# Patient Record
Sex: Male | Born: 1950 | Race: White | Hispanic: No | Marital: Married | Smoking: Never smoker
Health system: Southern US, Community
[De-identification: ages and names within clinical notes are randomized; demographics above are authoritative.]

## PROBLEM LIST (undated history)

## (undated) DIAGNOSIS — C61 Malignant neoplasm of prostate: Secondary | ICD-10-CM

## (undated) DIAGNOSIS — I10 Essential (primary) hypertension: Secondary | ICD-10-CM

## (undated) DIAGNOSIS — I4891 Unspecified atrial fibrillation: Secondary | ICD-10-CM

## (undated) DIAGNOSIS — C801 Malignant (primary) neoplasm, unspecified: Secondary | ICD-10-CM

## (undated) DIAGNOSIS — Z86718 Personal history of other venous thrombosis and embolism: Secondary | ICD-10-CM

## (undated) DIAGNOSIS — K219 Gastro-esophageal reflux disease without esophagitis: Secondary | ICD-10-CM

## (undated) DIAGNOSIS — I499 Cardiac arrhythmia, unspecified: Secondary | ICD-10-CM

## (undated) DIAGNOSIS — Z8719 Personal history of other diseases of the digestive system: Secondary | ICD-10-CM

## (undated) DIAGNOSIS — K2270 Barrett's esophagus without dysplasia: Secondary | ICD-10-CM

## (undated) DIAGNOSIS — E782 Mixed hyperlipidemia: Secondary | ICD-10-CM

## (undated) DIAGNOSIS — G4733 Obstructive sleep apnea (adult) (pediatric): Secondary | ICD-10-CM

## (undated) DIAGNOSIS — M2550 Pain in unspecified joint: Secondary | ICD-10-CM

## (undated) DIAGNOSIS — K859 Acute pancreatitis without necrosis or infection, unspecified: Secondary | ICD-10-CM

## (undated) DIAGNOSIS — I2699 Other pulmonary embolism without acute cor pulmonale: Secondary | ICD-10-CM

## (undated) DIAGNOSIS — M1990 Unspecified osteoarthritis, unspecified site: Secondary | ICD-10-CM

## (undated) DIAGNOSIS — Z87442 Personal history of urinary calculi: Secondary | ICD-10-CM

## (undated) DIAGNOSIS — N189 Chronic kidney disease, unspecified: Secondary | ICD-10-CM

## (undated) HISTORY — DX: Malignant neoplasm of prostate: C61

## (undated) HISTORY — DX: Barrett's esophagus without dysplasia: K22.70

## (undated) HISTORY — PX: KNEE ARTHROSCOPY: SUR90

## (undated) HISTORY — PX: HERNIA REPAIR: SHX51

## (undated) HISTORY — DX: Malignant (primary) neoplasm, unspecified: C80.1

## (undated) HISTORY — PX: CHOLECYSTECTOMY: SHX55

## (undated) HISTORY — DX: Personal history of other venous thrombosis and embolism: Z86.718

## (undated) HISTORY — DX: Pain in unspecified joint: M25.50

## (undated) HISTORY — DX: Unspecified atrial fibrillation: I48.91

## (undated) HISTORY — DX: Essential (primary) hypertension: I10

## (undated) HISTORY — DX: Obstructive sleep apnea (adult) (pediatric): G47.33

## (undated) HISTORY — PX: JOINT REPLACEMENT: SHX530

---

## 2001-10-23 HISTORY — PX: GALLBLADDER SURGERY: SHX652

## 2003-10-24 HISTORY — PX: RADIATION IMPLANT W/ ULTRASOUND, MALE: SUR1068

## 2006-02-20 ENCOUNTER — Ambulatory Visit: Admission: RE | Admit: 2006-02-20 | Discharge: 2006-05-21 | Payer: Self-pay | Admitting: Radiation Oncology

## 2006-07-16 ENCOUNTER — Ambulatory Visit: Payer: Self-pay | Admitting: Nurse Practitioner

## 2006-08-16 ENCOUNTER — Ambulatory Visit: Payer: Self-pay | Admitting: Unknown Physician Specialty

## 2006-09-17 ENCOUNTER — Encounter: Payer: Self-pay | Admitting: Unknown Physician Specialty

## 2006-09-22 ENCOUNTER — Encounter: Payer: Self-pay | Admitting: Unknown Physician Specialty

## 2006-10-23 ENCOUNTER — Encounter: Payer: Self-pay | Admitting: Unknown Physician Specialty

## 2006-10-23 DIAGNOSIS — C801 Malignant (primary) neoplasm, unspecified: Secondary | ICD-10-CM

## 2006-10-23 HISTORY — DX: Malignant (primary) neoplasm, unspecified: C80.1

## 2007-01-03 ENCOUNTER — Ambulatory Visit: Payer: Self-pay | Admitting: Nurse Practitioner

## 2007-07-23 ENCOUNTER — Ambulatory Visit: Payer: Self-pay | Admitting: Nurse Practitioner

## 2007-08-14 ENCOUNTER — Ambulatory Visit: Payer: Self-pay | Admitting: Nurse Practitioner

## 2007-09-27 ENCOUNTER — Ambulatory Visit (HOSPITAL_COMMUNITY): Admission: RE | Admit: 2007-09-27 | Discharge: 2007-09-27 | Payer: Self-pay | Admitting: General Surgery

## 2007-10-04 ENCOUNTER — Encounter: Admission: RE | Admit: 2007-10-04 | Discharge: 2007-10-05 | Payer: Self-pay | Admitting: General Surgery

## 2007-10-24 HISTORY — PX: LAPAROSCOPIC GASTRIC BANDING: SHX1100

## 2008-03-12 ENCOUNTER — Encounter: Admission: RE | Admit: 2008-03-12 | Discharge: 2008-06-10 | Payer: Self-pay | Admitting: General Surgery

## 2008-03-19 ENCOUNTER — Ambulatory Visit: Payer: Self-pay | Admitting: Unknown Physician Specialty

## 2008-03-30 ENCOUNTER — Ambulatory Visit (HOSPITAL_COMMUNITY): Admission: RE | Admit: 2008-03-30 | Discharge: 2008-03-31 | Payer: Self-pay | Admitting: General Surgery

## 2008-10-23 DIAGNOSIS — G4730 Sleep apnea, unspecified: Secondary | ICD-10-CM

## 2008-10-23 HISTORY — PX: UMBILICAL HERNIA REPAIR: SHX196

## 2008-10-23 HISTORY — DX: Sleep apnea, unspecified: G47.30

## 2008-10-26 ENCOUNTER — Ambulatory Visit (HOSPITAL_COMMUNITY): Admission: RE | Admit: 2008-10-26 | Discharge: 2008-10-27 | Payer: Self-pay | Admitting: General Surgery

## 2008-12-30 ENCOUNTER — Ambulatory Visit: Payer: Self-pay | Admitting: Nurse Practitioner

## 2009-08-05 ENCOUNTER — Ambulatory Visit: Payer: Self-pay | Admitting: Nurse Practitioner

## 2009-09-03 ENCOUNTER — Emergency Department: Payer: Self-pay | Admitting: Unknown Physician Specialty

## 2009-09-27 ENCOUNTER — Ambulatory Visit: Payer: Self-pay | Admitting: Nurse Practitioner

## 2009-10-23 HISTORY — PX: OTHER SURGICAL HISTORY: SHX169

## 2009-10-28 ENCOUNTER — Inpatient Hospital Stay: Payer: Self-pay | Admitting: Gastroenterology

## 2011-02-06 LAB — URINALYSIS, ROUTINE W REFLEX MICROSCOPIC
Bilirubin Urine: NEGATIVE
Glucose, UA: NEGATIVE mg/dL
Hgb urine dipstick: NEGATIVE
Protein, ur: NEGATIVE mg/dL
Specific Gravity, Urine: 1.02 (ref 1.005–1.030)

## 2011-02-06 LAB — DIFFERENTIAL
Basophils Relative: 0 % (ref 0–1)
Eosinophils Absolute: 0.2 10*3/uL (ref 0.0–0.7)
Lymphs Abs: 1 10*3/uL (ref 0.7–4.0)
Neutro Abs: 2.8 10*3/uL (ref 1.7–7.7)
Neutrophils Relative %: 64 % (ref 43–77)

## 2011-02-06 LAB — CBC
Hemoglobin: 12.3 g/dL — ABNORMAL LOW (ref 13.0–17.0)
RDW: 13.8 % (ref 11.5–15.5)

## 2011-02-06 LAB — COMPREHENSIVE METABOLIC PANEL
ALT: 52 U/L (ref 0–53)
AST: 39 U/L — ABNORMAL HIGH (ref 0–37)
Albumin: 3.5 g/dL (ref 3.5–5.2)
CO2: 29 mEq/L (ref 19–32)
Calcium: 8.9 mg/dL (ref 8.4–10.5)
GFR calc Af Amer: >60 mL/min (ref >60)
GFR calc non Af Amer: >60 mL/min (ref >60)
Total Bilirubin: 0.9 mg/dL (ref 0.3–1.2)

## 2011-02-17 ENCOUNTER — Encounter (INDEPENDENT_AMBULATORY_CARE_PROVIDER_SITE_OTHER): Payer: Self-pay | Admitting: General Surgery

## 2011-03-07 NOTE — Op Note (Signed)
Grant Diaz, Grant Diaz NO.:  [id number]   MEDICAL RECORD NO.:  [id number]          PATIENT TYPE:  OIB   LOCATION:  0098                         FACILITY:  [facility]   PHYSICIAN:  Grant Diaz, M.D.DATE OF BIRTH:  08-04-1951   DATE OF PROCEDURE:  03/30/2008  DATE OF DISCHARGE:                               OPERATIVE REPORT   PREOPERATIVE DIAGNOSES:  1. Morbid obesity.  2. Hiatal hernia.   POSTOPERATIVE DIAGNOSES:  1. Morbid obesity.  2. Hiatal hernia.   SURGICAL PROCEDURES:  1. Laparoscopic adjustable gastric band.  2. Hiatal hernia repair.   SURGEON:  Lorne Skeens. Diaz, M.D.   ASSISTANT:  Dr. Thea Alken.   ANESTHESIA:  General.   BRIEF HISTORY:  [name] is a 60 year old male with progressive morbid  obesity, unresponsive to medical management and multiple comorbidities.  After an extensive preoperative discussion and workup detailed  elsewhere, we have elected to proceed with placement of laparoscopic  adjustable gastric band for treatment of his obesity.  He has a known  hiatal hernia and preoperative upper GI series has confirmed a moderate-  sized hiatal hernia, which we will plan to repair at the time of his  band placement.   DESCRIPTION OF OPERATION:  The patient brought to the operating room,  placed in supine position on the operating table and general  endotracheal anesthesia was induced.  The abdomen was widely sterilely  prepped and draped.  The correct patient and procedure were verified.  He had received preoperative IV antibiotics and subcutaneous heparin.  PAS were in place.  Local anesthesia was used to infiltrate the trocar  sites prior to the incision.  A 1-cm incision was made in the left  subcostal space and access obtained with an 11 mm OptiVu trocar without  difficulty.  Pneumoperitoneum was established.  Under direct vision, a  15 mm trocar was placed well laterally in the subcostal space, another  11 mm trocar in  the right upper quadrant, another 11 mm trocar to the  left above the umbilicus for the camera port.  Through a 5-mm site, the  [facility] retractor was placed in the epigastrium and the left lobe of  liver elevated with excellent closure of the hiatus and stomach.  Finally, a 5 mm trocar was placed in the left flank.  There was an  obvious good-sized hiatal hernia present.  We proceeded with repair.  The hernia was reduced, grabbing the periesophageal fat pad and  retracted inferiorly.  The gastrohepatic omentum was opened along the  avascular plane.  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  The peritoneum up overlying the  anterior esophagus was incised with the harmonic scalpel and then  working back down onto the superior portion of the left crus.  Beginning  anterior to the right crus just beneath the esophagus, the peritoneum  here was incised and a careful blunt dissection was carried back behind  the esophagus.  A good-sized hernia was defined.  The anterior border of  the right crus was dissected as much as necessary to define the hernia.  The esophagus and posterior vagus nerve were identified and carefully  protected.  A very large mediastinal retroesophageal fat pad was reduced  out of the hernia.  This was then removed using the harmonic scalpel.  Looking for the patient's right, we then dissected the retroesophageal  space and there was good esophageal length.  The left crus initially was  difficult to define.  We then went back along the left side of the  esophagus and further dissected out the left crus back toward its  origin.  In order to define this, the retroesophageal space was  completely dissected.  A Penrose drain was passed behind the esophagus  which was then elevated and further dissection along the left crus  defined it well.  It was difficult  to define initially due to a lot of  fat overlying it, and the further fat was taken off of the left crus  prior to closure.  After the defect was well-defined, a crural repair  was performed with interrupted 0 Ethibond sutures, closing the hiatus  down to a normal size.  At this point, a sizing tube was passed orally  into the stomach.  The balloon was inflated to 15 mL and pulled back  into the hiatus and it was seen to be closed nicely with no tendency of  the balloon to go through the opening.  The dissection had been very  tedious and took an hour and 30 minutes prior to beginning the placement  of the lap band.  At this point, the sizing balloon was deflated and the  sizing tube pulled back up into the upper esophagus.  A point at the  base of the right crus some crossing fat was identified which was just a  little bit caudad to our previous dissection to allow some tissue for  posterior fixation.  This area was incised and the finger dissector  passed retrogastric without difficulty and then deployed up through the  previous dissected area at the angle of His.  An AP Standard flush band  system was introduced, tubing passed into the finger dissector and then  this was brought back around through the retrogastric tunnel without  difficulty.  The band was brought back retrogastric without any  difficulty.  At this point, there was clearly a very large anterior  esophageal fat pad as well that was going to make the fixation sutures  difficult to place and, therefore, this was carefully resected  protecting the EG junction and the large anterior fat pad removed with  the harmonic scalpel.  Following this, the band was buckled over the  sizing tube without undue tension.  The sizing tube was withdrawn and  holding the tubing toward the patient's feet, the fundus beginning up  near the angle of His was imbricated up over the band to the small  gastric pouch with interrupted 2-0 Ethibond  sutures.  The band appeared  to be in excellent position.  The abdomen was carefully inspected for  hemostasis or any evidence of trocar injury and everything looked fine.  The Nathanson retractor was removed under direct vision.  The tubing was  brought out through the right mid abdominal trocar site and all CO2  evacuated and the trocars removed.  The right mid abdominal incision was  extended somewhat for placement of the port and the anterior fascia  identified and  cleared.  The tubing was trimmed and attached to the  port.  The port was sutured to the anterior fascia with four interrupted  2-0 Prolene sutures.  The tubing was seen to curve smoothly into the  abdomen.  The wounds were irrigated and the subcu closed with running 2-  0 Vicryl and the skin closed with subcuticular 4-0 Monocryl and  Dermabond.  Sponge, needle and instrument counts were correct.  The  patient was taken to recovery in good condition.      Lorne Skeens. Diaz, M.D.  Electronically Signed     BTH/MEDQ  D:  03/30/2008  T:  03/30/2008  Job:  [id number]

## 2011-03-07 NOTE — Op Note (Signed)
NAMEPAIGE, VANDERWOUDE                ACCOUNT NO.:  [id number]   MEDICAL RECORD NO.:  [id number]          PATIENT TYPE:  AMB   LOCATION:  DAY                          FACILITY:  [facility]   PHYSICIAN:  Sharlet Salina T. Hoxworth, M.D.DATE OF BIRTH:  September 16, 1951   DATE OF PROCEDURE:  10/26/2008  DATE OF DISCHARGE:                               OPERATIVE REPORT   POSTOPERATIVE DIAGNOSIS:  Primary ventral hernia.   SURGICAL PROCEDURES:  Laparoscopic repair of primary ventral hernia.   SURGEON:  Lorne Skeens. Hoxworth, M.D.   ANESTHESIA:  General.   BRIEF HISTORY:  Grant Diaz is a 60 year old male who is status post  lap band surgery in June of last year.  He has had a known primary  ventral hernia that initially was minimally symptomatic.  We postponed  repair until he could get some weight off.  He has lost 75 pounds.  His  hernia has become increasingly symptomatic with one episode of  incarceration that resolved.  Imaging has shown that it contained  omentum only.  Due to worsening of symptoms, we have elected to proceed  at this point.  The nature of the procedure, indications, risks of  anesthesia, bleeding, infection, recurrence, visceral injury were all  discussed and understood and he is brought to the operating room for  this procedure.   DESCRIPTION OF OPERATION:  The patient was brought to the operating room  and placed in supine position on the operating table and general  orotracheal anesthesia was induced.  He received preoperative IV  antibiotics.  Foley catheter was placed.  The abdomen was widely  sterilely prepped and draped with Ioban.  Correct patient and procedure  were verified.  Access was obtained in the left upper quadrant laterally  with an 11 mm OptiVu trocar without difficulty and pneumoperitoneum  established.  There was no evidence of any of trocar injury.  Under  direct vision, two 5 mm trocars were placed well laterally in the left  upper quadrant and left lower  quadrant.  The hernia defect was just  above the umbilicus and contained omentum.  Using blunt dissection and  harmonic scalpel, a large amount of omentum was reduced from the hernia  defect.  It did not contain any bowel.  Once the omentum was all  reduced, the hernia defect was very discrete and measured about 3.5 cm  in diameter.  The lap band tubing could be seen entering up near the  falciform ligament and appeared to be far enough away from the hernia  for a good overlap of the mesh.  I then fashioned a 15 cm diameter round  piece of Parietex dual sided mesh.  6-0 Prolene stay sutures were placed  symmetrically around the periphery.  Corresponding small stab incisions  were made in the skin and then the mesh was introduced into the abdomen,  unfurled and oriented.  Suture graspers were used to bring the suture up  to the anterior abdominal wall.  On the most superior suture, this was a  centimeter or two below the entrance point of the lap band tubing  which  was carefully protected.  Mesh was then brought up to the anterior  abdominal wall with very nice deployment and the sutures tied.  The Endo  tacker was then used to secure the mesh further at its periphery anda  second inner row was placed near to the defect.  This appeared to  provide nice broad coverage with at least 5 cm coverage in all  directions.  The abdomen was inspected for hemostasis or any evidence  of bowel injury and none was seen.  Trocars removed and all CO2  evacuated.  Skin incisions were closed with subcuticular Monocryl and  Dermabond.  Sponge, needle and instrument counts were correct.  The  patient was taken to recovery in good condition.      Lorne Skeens. Hoxworth, M.D.  Electronically Signed     BTH/MEDQ  D:  10/26/2008  T:  10/26/2008  Job:  [id number]

## 2011-04-28 ENCOUNTER — Encounter (INDEPENDENT_AMBULATORY_CARE_PROVIDER_SITE_OTHER): Payer: Self-pay | Admitting: General Surgery

## 2011-04-28 ENCOUNTER — Ambulatory Visit (INDEPENDENT_AMBULATORY_CARE_PROVIDER_SITE_OTHER): Payer: BC Managed Care – PPO | Admitting: General Surgery

## 2011-04-28 NOTE — Patient Instructions (Signed)
Call if no improvement

## 2011-04-28 NOTE — Progress Notes (Signed)
Patient returns for followup of lap band now placed 3 years ago on 6809. He had a filled by Mardelle Matte in April of this year. He however has had gradually increasing over restriction symptoms since that time. He now is barely able to tolerate solid food. He is having some frequent regurgitation and occasional nighttime reflux.  On exam today his weight is down 7 pounds from last visit 2-15 which represents an 89 pound weight loss from surgery. BMI is 30.77 which is down from 43.55 preoperatively.  He clearly is over restricted. With this information we removed the one quarter cc that was placed in April. I think our goal at this point his weight maintenance. He is overall had an excellent result. He does have comorbidities of hypertension and sleep apnea that remain current although his joint pain is resolved  He will call if this does not resolve his current symptoms and otherwise return in 6 months.

## 2011-07-20 LAB — CBC
MCHC: 34.4
MCV: 85.8
RDW: 14.7
WBC: 7.1

## 2011-07-20 LAB — DIFFERENTIAL
Basophils Absolute: 0
Eosinophils Absolute: 0
Eosinophils Relative: 0
Lymphocytes Relative: 7 — ABNORMAL LOW
Neutrophils Relative %: 86 — ABNORMAL HIGH

## 2011-07-20 LAB — BASIC METABOLIC PANEL
Chloride: 107
Creatinine, Ser: 1.23
GFR calc Af Amer: >60
GFR calc non Af Amer: >60
Sodium: 142

## 2011-07-20 LAB — HEMOGLOBIN AND HEMATOCRIT, BLOOD: Hemoglobin: 12.9 — ABNORMAL LOW

## 2011-10-24 DIAGNOSIS — K859 Acute pancreatitis without necrosis or infection, unspecified: Secondary | ICD-10-CM

## 2011-10-24 HISTORY — DX: Acute pancreatitis without necrosis or infection, unspecified: K85.90

## 2011-12-07 ENCOUNTER — Encounter (INDEPENDENT_AMBULATORY_CARE_PROVIDER_SITE_OTHER): Payer: Self-pay | Admitting: General Surgery

## 2012-02-15 ENCOUNTER — Ambulatory Visit (INDEPENDENT_AMBULATORY_CARE_PROVIDER_SITE_OTHER): Payer: BC Managed Care – PPO | Admitting: General Surgery

## 2012-02-15 DIAGNOSIS — R1310 Dysphagia, unspecified: Secondary | ICD-10-CM

## 2012-02-15 NOTE — Progress Notes (Signed)
Chief complaint: Followup lap band  History: Patient returns for followup lab and placed in June 2009. I last saw him July 2012 at which point he clearly had symptoms of over restriction and we removed one quarter cc that had been placed in April of that year. This resulted in little bit of improvement but he essentially has had intermittent dysphagia to solid foods for the past at least 6 months. This is inconsistent where he often is able to eat solid foods but appropriate restriction particularly at lunch at other times he is unable to tolerate solids and finds he is doing some maladaptive eating. He is having some occasional nighttime reflux.  Exam: BP 122/80  Pulse 68  Temp 97.4 F (36.3 C)  Resp 16  Ht 5\' 10"  (1.778 m)  Wt 223 lb 6.4 oz (101.334 kg)  BMI 32.05 kg/m2 Total weight loss 81 pounds,  Up 8 pounds since last visit General: Appears well Abdomen: Soft and nontender. Incisions the port site looks fine.  Assessment and plan: Status post lap band with some inconsistent symptoms of over restriction. He may have some maladaptive eating. I would like to get a barium swallow at this point to rule out slip or other complication and to assess the restriction of his band. I will call him back and we will arrange followup after the study.

## 2012-02-16 ENCOUNTER — Ambulatory Visit (HOSPITAL_COMMUNITY)
Admission: RE | Admit: 2012-02-16 | Discharge: 2012-02-16 | Disposition: A | Discharge status: Home / Self Care | Payer: BC Managed Care – PPO | Source: Ambulatory Visit | Attending: General Surgery | Admitting: General Surgery

## 2012-02-16 DIAGNOSIS — Z9884 Bariatric surgery status: Secondary | ICD-10-CM | POA: Insufficient documentation

## 2012-02-16 DIAGNOSIS — R1310 Dysphagia, unspecified: Secondary | ICD-10-CM | POA: Insufficient documentation

## 2012-02-19 ENCOUNTER — Telehealth (INDEPENDENT_AMBULATORY_CARE_PROVIDER_SITE_OTHER): Payer: Self-pay

## 2012-02-19 NOTE — Telephone Encounter (Signed)
Patient notified of Barium Swallow results, will return in 6 - 8 weeks for follow up w/Dr. Johna Sheriff.

## 2013-05-08 ENCOUNTER — Encounter (INDEPENDENT_AMBULATORY_CARE_PROVIDER_SITE_OTHER): Payer: BC Managed Care – PPO | Admitting: General Surgery

## 2013-05-14 ENCOUNTER — Telehealth (INDEPENDENT_AMBULATORY_CARE_PROVIDER_SITE_OTHER): Payer: Self-pay

## 2013-05-14 NOTE — Telephone Encounter (Signed)
Called and left message for patient with follow up appointment date & time for 05/15/13 @ 1:45 pm w/Dr. Johna Sheriff

## 2013-05-15 ENCOUNTER — Encounter (INDEPENDENT_AMBULATORY_CARE_PROVIDER_SITE_OTHER): Payer: Self-pay | Admitting: General Surgery

## 2013-05-15 ENCOUNTER — Other Ambulatory Visit (INDEPENDENT_AMBULATORY_CARE_PROVIDER_SITE_OTHER): Payer: Self-pay | Admitting: Surgery

## 2013-05-15 ENCOUNTER — Other Ambulatory Visit (INDEPENDENT_AMBULATORY_CARE_PROVIDER_SITE_OTHER): Payer: Self-pay

## 2013-05-15 ENCOUNTER — Ambulatory Visit (INDEPENDENT_AMBULATORY_CARE_PROVIDER_SITE_OTHER): Payer: BC Managed Care – PPO | Admitting: General Surgery

## 2013-05-15 DIAGNOSIS — K219 Gastro-esophageal reflux disease without esophagitis: Secondary | ICD-10-CM

## 2013-05-15 DIAGNOSIS — Z4651 Encounter for fitting and adjustment of gastric lap band: Secondary | ICD-10-CM

## 2013-05-15 NOTE — Progress Notes (Signed)
Chief complaint: Followup lap band, persistent regurgitation  History: Patient returns for follow up of lab and placed June 2009. He overall has had very successful weight loss. Over the last couple of years however he has had some increasing difficulty with regurgitation and reflux. We took some fluid back out in 2011 and then refilled and 2012. I last saw him in April of 2013. He was having some occasional regurgitation and reflux. We obtained a esophagram that showed some mild esophageal dilatation and reflux but good patency at the band was in normal position. He however has had probably worsening difficulty with episodic regurgitation with solid food. This will not happen about 3 times a week and he finds himself eating a soft or liquid food because he cannot tolerate solid healthy food. He is noted a few red flexed occasionally regurgitation, possibly blood.  Exam: BP 125/86  Pulse 70  Temp(Src) 98.6 F (37 C) (Temporal)  Resp 14  Ht 5' 10" (1.778 m)  Wt 230 lb (104.327 kg)  BMI 33 kg/m2 Total weight loss 74 pounds, up 6 pounds from April of 2013. General: Well-appearing Caucasian male Abdomen: Soft and nontender. Port site looks fine.  Assessment and plan: Worsening regurgitation with history Lapband as above. Some evidence of esophageal dysmotility or esophageal dilatation on his study in 2013. After discussion today we elected to remove some fluid to try to allow it to be solid healthy food and minimize maladaptive eating. Access is poor without difficulty and removed 1 cc which did bring him down to 4.25 cc. Due to his persistent symptoms and questionable blood we will go ahead and plan upper endoscopy to evaluate his esophagus and pouch. I gave him an appointment for 6 months but he will return sooner if we did today does not improve his symptoms. 

## 2013-05-15 NOTE — Patient Instructions (Signed)
Our office should contact you regarding upper endoscopy by Dr. Ezzard Standing

## 2013-05-16 ENCOUNTER — Encounter (INDEPENDENT_AMBULATORY_CARE_PROVIDER_SITE_OTHER): Payer: BC Managed Care – PPO | Admitting: General Surgery

## 2013-05-19 ENCOUNTER — Encounter (INDEPENDENT_AMBULATORY_CARE_PROVIDER_SITE_OTHER): Payer: Self-pay | Admitting: General Surgery

## 2013-05-26 ENCOUNTER — Telehealth (INDEPENDENT_AMBULATORY_CARE_PROVIDER_SITE_OTHER): Payer: Self-pay | Admitting: *Deleted

## 2013-05-26 ENCOUNTER — Telehealth (INDEPENDENT_AMBULATORY_CARE_PROVIDER_SITE_OTHER): Payer: Self-pay

## 2013-05-26 NOTE — Telephone Encounter (Signed)
Patient called to ask about taking his medications the morning of his EGD.  Patient states it says no intake after midnight however patient states he takes BP medication in the AM.  Does he take this or not?

## 2013-05-26 NOTE — Telephone Encounter (Signed)
Advised patient it is ok to take his BP medication before his scheduled EDG 05/28/13

## 2013-05-28 ENCOUNTER — Ambulatory Visit (HOSPITAL_COMMUNITY)
Admission: RE | Admit: 2013-05-28 | Discharge: 2013-05-28 | Disposition: A | Discharge status: Home / Self Care | Payer: BC Managed Care – PPO | Source: Ambulatory Visit | Attending: Surgery | Admitting: Surgery

## 2013-05-28 ENCOUNTER — Encounter (HOSPITAL_COMMUNITY)
Admission: RE | Disposition: A | Discharge status: Home / Self Care | Payer: Self-pay | Source: Ambulatory Visit | Attending: Surgery

## 2013-05-28 ENCOUNTER — Other Ambulatory Visit (INDEPENDENT_AMBULATORY_CARE_PROVIDER_SITE_OTHER): Payer: Self-pay | Admitting: Surgery

## 2013-05-28 DIAGNOSIS — K219 Gastro-esophageal reflux disease without esophagitis: Secondary | ICD-10-CM | POA: Insufficient documentation

## 2013-05-28 DIAGNOSIS — K210 Gastro-esophageal reflux disease with esophagitis: Secondary | ICD-10-CM

## 2013-05-28 DIAGNOSIS — R1115 Cyclical vomiting syndrome unrelated to migraine: Secondary | ICD-10-CM | POA: Insufficient documentation

## 2013-05-28 DIAGNOSIS — Z9884 Bariatric surgery status: Secondary | ICD-10-CM | POA: Insufficient documentation

## 2013-05-28 HISTORY — PX: ESOPHAGOGASTRODUODENOSCOPY: SHX5428

## 2013-05-28 SURGERY — EGD (ESOPHAGOGASTRODUODENOSCOPY)
Anesthesia: Moderate Sedation

## 2013-05-28 MED ORDER — MIDAZOLAM HCL 10 MG/2ML IJ SOLN
INTRAMUSCULAR | Status: AC
  Filled 2013-05-28: qty 2

## 2013-05-28 MED ORDER — SODIUM CHLORIDE 0.9 % IV SOLN: INTRAVENOUS | Status: DC

## 2013-05-28 MED ORDER — MIDAZOLAM HCL 10 MG/2ML IJ SOLN
INTRAMUSCULAR | Status: DC | PRN
  Administered 2013-05-28 (×3): 2 mg via INTRAVENOUS

## 2013-05-28 MED ORDER — FENTANYL CITRATE 0.05 MG/ML IJ SOLN
INTRAMUSCULAR | Status: AC
  Filled 2013-05-28: qty 2

## 2013-05-28 MED ORDER — BUTAMBEN-TETRACAINE-BENZOCAINE 2-2-14 % EX AERO
INHALATION_SPRAY | CUTANEOUS | Status: DC | PRN
  Administered 2013-05-28: 2 via TOPICAL

## 2013-05-28 MED ORDER — FENTANYL CITRATE 0.05 MG/ML IJ SOLN
INTRAMUSCULAR | Status: DC | PRN
  Administered 2013-05-28 (×3): 25 ug via INTRAVENOUS

## 2013-05-28 MED ORDER — LACTATED RINGERS IV SOLN
INTRAVENOUS | Status: DC
  Administered 2013-05-28: 1000 mL via INTRAVENOUS

## 2013-05-28 NOTE — Op Note (Signed)
05/28/2013  9:08 AM  PATIENT:  Grant Diaz, 62 y.o., male, MRN: [id number]  PREOP DIAGNOSIS:  History of lap band, reflux/regurgitation  POSTOP DIAGNOSIS:   Lap band in normal location.  No endoscopic evidence of esophagitis.  PROCEDURE:  Esophagogastroduedonoscopy  SURGEON:   Ovidio Kin, M.D.  ANESTHESIA:   Fentanyl  75 mcg   Versed 6 mg  INDICATIONS FOR PROCEDURE:  Grant Diaz is a 62 y.o. (DOB: 08/02/1951)  white  male whose primary care physician is PATTERSON, Olegario Messier, FNP and comes for upper endoscopy to evaluate symptoms of reflux esophagitis.  The patient had a lap band placed by Dr. Johna Sheriff on 03/30/2008.  He has lost from 317 pounds to 230 pounds.     He has had reflux and regurgitation with some difficulty adjusting his lap band.  An UGI on 02/16/2012 showed some reflux with esophageal motility.  On 05/15/2013 Dr. Johna Sheriff removed 1 cc and many of symptoms have improved.   The indications and risks of the endoscopy were explained to the patient.  The risks include, but are not limited to, perforation, bleeding, or injury to the bowel.  PROCEDURE:  The patient was monitored with a pulse oximetry, BP cuff, and EKG.  The patient has nasal O2 flowing during the procedure.   The back of the throat was anesthestized with Ceticaine.  A flexible Pentax endoscope was passed down the throat without difficulty.  Findings include:   Esophagus:   Normal.  Maybe slightly dilated distally, but this is a soft finding.   GE junction at:  35 cm   Lap band:  Opening of lap band is at 38 cm.  He has a normal pouch for a lap band and, from an endoscopic viewpoint, the lap band is in good position.   Stomach: Normal.   Duodenum:   Normal.  PLAN:  Photos take and given to patient.       To follow up with Dr. Johna Sheriff.  Since removal of 1 cc of fluid, most of the patient's symptoms have resolved.  Ovidio Kin, MD, [facility] Surgery Pager: [phone] Office phone:   [phone]

## 2013-05-28 NOTE — Interval H&P Note (Signed)
History and Physical Interval Note:  05/28/2013 8:38 AM  [name]  has presented today for surgery, with the diagnosis of esophageal reflux  The various methods of treatment have been discussed with the patient and family. Wife at bedside.  After consideration of risks, benefits and other options for treatment, the patient has consented to  Procedure(s): ESOPHAGOGASTRODUODENOSCOPY (EGD) (N/A) as a surgical intervention .    The patient's history has been reviewed, patient examined, no change in status, stable for surgery.  I have reviewed the patient's chart and labs.  Questions were answered to the patient's satisfaction.     Aunna Snooks H

## 2013-05-28 NOTE — H&P (View-Only) (Signed)
Chief complaint: Followup lap band, persistent regurgitation  History: Patient returns for follow up of lab and placed June 2009. He overall has had very successful weight loss. Over the last couple of years however he has had some increasing difficulty with regurgitation and reflux. We took some fluid back out in 2011 and then refilled and 2012. I last saw him in April of 2013. He was having some occasional regurgitation and reflux. We obtained a esophagram that showed some mild esophageal dilatation and reflux but good patency at the band was in normal position. He however has had probably worsening difficulty with episodic regurgitation with solid food. This will not happen about 3 times a week and he finds himself eating a soft or liquid food because he cannot tolerate solid healthy food. He is noted a few red flexed occasionally regurgitation, possibly blood.  Exam: BP 125/86  Pulse 70  Temp(Src) 98.6 F (37 C) (Temporal)  Resp 14  Ht 5\' 10"  (1.778 m)  Wt 230 lb (104.327 kg)  BMI 33 kg/m2 Total weight loss 74 pounds, up 6 pounds from April of 2013. General: Well-appearing Caucasian male Abdomen: Soft and nontender. Port site looks fine.  Assessment and plan: Worsening regurgitation with history Lapband as above. Some evidence of esophageal dysmotility or esophageal dilatation on his study in 2013. After discussion today we elected to remove some fluid to try to allow it to be solid healthy food and minimize maladaptive eating. Access is poor without difficulty and removed 1 cc which did bring him down to 4.25 cc. Due to his persistent symptoms and questionable blood we will go ahead and plan upper endoscopy to evaluate his esophagus and pouch. I gave him an appointment for 6 months but he will return sooner if we did today does not improve his symptoms.

## 2013-05-29 ENCOUNTER — Encounter (HOSPITAL_COMMUNITY): Payer: Self-pay | Admitting: Surgery

## 2013-08-07 NOTE — Telephone Encounter (Signed)
error 

## 2013-08-28 ENCOUNTER — Other Ambulatory Visit: Payer: Self-pay

## 2014-12-22 ENCOUNTER — Ambulatory Visit
Admit: 2014-12-22 | Disposition: A | Discharge status: Home / Self Care | Payer: Self-pay | Attending: Oncology | Admitting: Oncology

## 2014-12-22 DIAGNOSIS — I2699 Other pulmonary embolism without acute cor pulmonale: Secondary | ICD-10-CM

## 2014-12-22 HISTORY — DX: Other pulmonary embolism without acute cor pulmonale: I26.99

## 2015-01-08 ENCOUNTER — Inpatient Hospital Stay: Payer: Self-pay | Admitting: Internal Medicine

## 2015-01-22 ENCOUNTER — Ambulatory Visit
Admit: 2015-01-22 | Disposition: A | Discharge status: Home / Self Care | Payer: Self-pay | Attending: Oncology | Admitting: Oncology

## 2015-02-21 NOTE — H&P (Signed)
PATIENT NAME:  Grant Diaz, Grant Diaz MR#:  [id number] DATE OF BIRTH:  04-28-1951  DATE OF ADMISSION:  01/08/2015  PRIMARY CARE PROVIDER: [facility].   CHIEF COMPLAINT: Shortness of breath.   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 He walks his dog. Earlier today, he was trying to walk his dog when all of a sudden, he started having shortness of breath and chest pressure. The patient came to the ED and was noted to have bilateral pulmonary embolism. He otherwise reports no previous history of clotting disorder. No recent trauma. Denies any significant weight loss or weight gain. He reports that his last colonoscopy was about 8 years ago and there was nothing alarming about it. He otherwise denies any fevers, chills, or night sweats.   PAST MEDICAL HISTORY:  1. Prostate cancer status post radiation therapy.  2. Hypertension.  3. History of hiatal hernia.   PAST SURGICAL HISTORY: Status post prostate surgery, hernia repair, bilateral knee surgery, cholecystectomy, and laparoscopic band placement.   ALLERGIES: None.   MEDICATIONS: Tramadol 50 q.8 h. p.r.n., hydrochlorothiazide/lisinopril 12.5/20 daily, amlodipine 10 daily.   SOCIAL HISTORY: Denies any smoking, alcohol or drug use.   FAMILY HISTORY: His brother 8 years younger than him, had a blood clot, as well. He is not able to tell me the details. There no history of clotting disorder that has been diagnosed. Father had Parkinson disease, hypertension. Mother had diabetes.   REVIEW OF SYSTEMS:    CONSTITUTIONAL: Denies any fevers, fatigue, no weakness, no weight loss, no weight gain.  EYES: No blurred or double vision. No redness. No inflammation.  ENT: No tinnitus. No ear pain. No  hearing loss. No seasonal or year-round allergies. No epistaxis.  RESPIRATORY: Complains of no cough, wheezing, hemoptysis. Complains of dyspnea. No COPD , no TB.  CARDIOVASCULAR: Denies any chest pain, orthopnea, edema.  GASTROINTESTINAL: No nausea, vomiting, diarrhea. No abdominal pain. No hematemesis.  GENITOURINARY: Denies any dysuria, hematuria, renal calculi or frequency.  ENDOCRINE: Denies any polyuria or nocturia. HEME AND LYMPHATIC: Denies anemia, easy bruisability or bleeding.  SKIN: No rash.  MUSCULOSKELETAL: No pain in the neck, back or shoulder.  NEUROLOGIC: No numbness, CVA, TIA.  PSYCHIATRIC: No anxiety, insomnia, or ADD.   PHYSICAL EXAMINATION:  VITAL SIGNS: Temperature 97.6, pulse 69, respirations 18, blood pressure 144/99, O2 of 98%.  GENERAL: The patient is an obese male, in no acute distress.  HEENT: Head atraumatic, normocephalic. Pupils equally round, reactive to light and accommodation. There is no conjunctival pallor. No scleral icterus. Nasal examination shows no drainage or ulceration. External ear exam shows no erythema or drainage.  NECK: Supple without any thyromegaly.  CARDIOVASCULAR: Regular rate and rhythm. No murmurs, rubs, clicks, or gallops.  LUNGS: Clear to auscultation bilaterally without any rales, rhonchi, wheezing.  ABDOMEN: Soft, nontender, nondistended. Positive bowel sounds x 4. No hepatosplenomegaly.  EXTREMITIES: No clubbing, cyanosis, or edema.  SKIN: No rash.  LYMPH NODES: Nonpalpable.  VASCULAR: Good DP/PT pulses.  PSYCHIATRIC: Not anxious or depressed.   LABORATORY DATA: Glucose 87, BUN 22, creatinine 0.87, sodium 141, potassium 3.8, calcium 8.9. LFTs are normal.   Troponin less than 0.03 x 2.   WBC 7.1, hemoglobin 12.9, platelet count 138,000.  CT scan of the chest per PE protocol, extensive central lobar and segmental pulmonary  in the right pulmonary artery and arterial tree. Also a large filling defect began at the bifurcation in  the main pulmonary artery. There is also a tiny subsegmental pulmonary embolism in the left lower lobe.   ASSESSMENT AND PLAN: The patient is a 64 year old, white male, with history of hypertension and history of prostate cancer, who presents with shortness of breath, noted to have bilateral pulmonary embolism.   1. Shortness of breath due to bilateral pulmonary embolism. 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 Brother with "clots" so he may have either acquired hypercoagulable state or genetically acquired hypercoagulable state. At this time, we will check prothrombin gene, factor V, protein C and S, and anticardiolipin antibodies. We will also check a CEA, PSA level. I will ask a hematology consult. Start him on a heparin drip. Likely can transition him to one of the newer anticoagulation agents.  2. Hypertension. I will continue hydrochlorothiazide and lisinopril. We will hold amlodipine.  3. Miscellaneous. History of prostate cancer. PSA will be checked.   CODE STATUS: FULL CODE.  TIME SPENT ON THIS PATIENT THIS: 50 minutes.    ____________________________ Lafonda Mosses Posey Pronto, MD shp:JT D: 01/08/2015 11:50:19 ET T: 01/08/2015 12:30:10 ET JOB#: [id number]  cc: Grant Denbleyker H. Posey Pronto, MD, <Dictator> Alric Seton MD ELECTRONICALLY SIGNED 01/22/2015 14:12

## 2015-02-21 NOTE — Discharge Summary (Signed)
PATIENT NAME:  Grant Diaz, Grant Diaz MR#:  [id number] DATE OF BIRTH:  11-26-1950  DATE OF ADMISSION:  01/08/2015 DATE OF DISCHARGE:  01/09/2015  PRESENTING COMPLAINT: Shortness of breath.  PRIMARY CARE PROVIDER:  [facility].    ONCOLOGY:  Dr. Grayland Ormond.   DISCHARGE DIAGNOSES:   1.  Bilateral pulmonary embolism.  2.  Hypertension.  CODE STATUS:  Full code.  MEDICATIONS:  1.  Eliquis 10 mg b.i.d. for 7 days, then 5 mg b.i.d.  2.  Tramadol 50 mg 1 tablet 8 eight hours as needed.  3.  Amlodipine 10 mg daily.  4.  Hydrochlorothiazide/lisinopril 12.5/20 one tablet b.i.d.   DIET: Low sodium.   FOLLOWUP:   1.  With Dr. Grayland Ormond in 1-2 weeks.  2.  Follow up with [name], primary care at [facility] [place], in 2-4 weeks.   ONCOLOGY CONSULTATION:  Dr. Grayland Ormond.   LABORATORY DATA:   1.  Hemoglobin and hematocrit is 13.5 and 39.9. White count is 7.7. Comprehensive metabolic panel within normal limits.  2.  Ultrasound Doppler lower extremities shows superficial vein thrombosis within the right lesser saphenous and gastrocnemius veins.   3.  7.1 cm left popliteal cyst.  3.  CEA 2.6. PSA less than 0.1. CA 19-9 is seven. PT/INR is within normal limits.  4.  CT angiography of the chest shows there is large burden of right-sided pulmonary thromboembolism associated with right heart strain.  A tiny subsegmental pulmonary embolism in the left lobe may be present.    BRIEF SUMMARY OF HOSPITAL COURSE:  Mr. Grant Diaz is a 64 year old Caucasian gentleman with history of prostate cancer status post treatment.  Comes in with increasing shortness of breath.  He was admitted with: 1.  Shortness of breath due to bilateral acute pulmonary embolism.  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 He also has a family history of blood clots in brother. This could be acquired hypercoagulable state or genetically acquired hypercoagulable state. The  patient was started on heparin drip, changed to p.o. Eliquis adequate at discharge.  His saturations remained for than 92%. He was hemodynamically stable.  Seen by Dr. Grayland Ormond, okay to go home.  Follow up hypercoagulable workup labs as outpatient. Ultrasound Doppler showed superficial right-sided superficial femoral vein thrombosis.  2.  Hypertension. Resumed his hydrochlorothiazide/lisinopril, and amlodipine.  3.  h/o prostate cancer   ____________________________ Hart Rochester. Posey Pronto, MD sap:sp D: 01/10/2015 07:43:23 ET T: 01/10/2015 09:38:36 ET JOB#: [id number]  cc: [name] A. Posey Pronto, MD, <Dictator> Kathlene November. Grayland Ormond, MD [place] MD ELECTRONICALLY SIGNED 01/25/2015 12:18

## 2015-02-21 NOTE — Consult Note (Signed)
Note Type Consult   Subjective: Chief Complaint/Diagnosis:   Bilateral PEs. HPI:   Patient is a 64 year old male who presented to the emergency room with increasing shortness of breath. 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 He has no history of DVT or PE, but does admit his brother has an extensive clot history. Subsequent workup revealed PEs. Currently, he still feels short of breath but otherwise well. He has no neurologic complaints. He denies any fevers. He has a good appetite and denies weight loss. He denies any nausea, vomiting, constipation, or diarrhea. He has no urinary complaints. Patient otherwise feels well and offers no further specific complaints.   Review of Systems:  Performance Status (ECOG): 0  Cardiac: denies chest pain  Review of Systems:   As per HPI. Otherwise, everything else reported negative.   Allergies:  No Known Allergies:   PFSH: Additional Past Medical and Surgical History: prostate cancer status post prostatectomy and XRT approximately 8 years ago. Hypertension, hernia repair, bilateral knee surgery, cholecystectomy, laparoscopic band placement.    Family history: Younger brother with history of blood clot, father with Parkinson's and hypertension. Mother with diabetes.    Social history: Patient denies tobacco and alcohol.   Home Medications: Medication Instructions Last Modified Date/Time  hydrochlorothiazide-lisinopril 12.5 mg-20 mg oral tablet 1 tab(s) orally 2 times a day 18-Mar-16 09:38  amLODIPine 10 mg oral tablet 1 tab(s) orally once a day 18-Mar-16 09:38  traMADol 50 mg oral tablet 1 tab(s) orally every 8 hours, As Needed 18-Mar-16 09:38   Vital Signs:  :: vital signs stable, patient afebrile.   Physical Exam:  General: well developed, well nourished, and in no acute distress  Mental Status: normal affect  Eyes: anicteric sclera  Head, Ears, Nose,Throat: Normocephalic,  moist mucous membranes, clear oropharynx without erythema or thrush.  Neck, Thyroid: No palpable lymphadenopathy, thyroid midline without nodules.  Respiratory: clear to auscultation bilaterally  Cardiovascular: regular rate and rhythm, no murmur, rub, or gallop  Gastrointestinal: soft, nondistended, nontender, no organomegaly.  normal active bowel sounds  Musculoskeletal: No edema  Skin: No rash or petechiae noted  Neurological: alert, answering all questions appropriately.  Cranial nerves grossly intact   Laboratory Results: Hepatic:  18-Mar-16 07:35   Bilirubin, Total 0.9 (0.3-1.2 NOTE: New Reference Range  12/15/14)  Alkaline Phosphatase 56 (38-126 NOTE: New Reference Range  12/15/14)  SGPT (ALT)  16 (17-63 NOTE: New Reference Range  12/15/14)  SGOT (AST) 22 (15-41 NOTE: New Reference Range  12/15/14)  Total Protein, Serum 6.6 (6.5-8.1 NOTE: New Reference Range  12/15/14)  Albumin, Serum 3.6 (3.5-5.0 NOTE: New reference range  12/15/14)  Routine Chem:  18-Mar-16 07:35   B-Type Natriuretic Peptide ([facility]) 75 (0-99 NOTE: New Reference Range  12/15/14)  Glucose, Serum 87 (65-99 NOTE: New Reference Range  12/15/14)  BUN  22 (6-20 NOTE: New Reference Range  12/15/14)  Creatinine (comp) 0.87 (0.61-1.24 NOTE: New Reference Range  12/15/14)  Sodium, Serum 141 (135-145 NOTE: New Reference Range  12/15/14)  Potassium, Serum 3.8 (3.5-5.1 NOTE: New Reference Range  12/15/14)  Chloride, Serum 107 (101-111 NOTE: New Reference Range  12/15/14)  CO2, Serum 29 (22-32 NOTE: New Reference Range  12/15/14)  Calcium (Total), Serum 8.9 (8.9-10.3 NOTE: New Reference Range  12/15/14)  eGFR (African American) >60  eGFR (Non-African American) >60 (eGFR values <42m/min/1.73 m2 may be an indication of chronic kidney disease (CKD). Calculated eGFR is  useful in patients with stable renal function. The eGFR calculation will not be reliable in acutely ill patients when serum  creatinine is changing rapidly. It is not useful in patients on dialysis. The eGFR calculation may not be applicable to patients at the low and high extremes of body sizes, pregnant women, and vegetarians.)  Anion Gap  5  Cardiac:  18-Mar-16 07:35   Troponin I <0.03 (0.00-0.03 0.03 ng/mL or less: NEGATIVE  Repeat testing in 3-6 hrs  if clinically indicated. >0.03 ng/mL: POTENTIAL  MYOCARDIAL INJURY. Repeat  testing in 3-6 hrs if  clinically indicated. NOTE: An increase or decrease  of 30% or more on serial  testing suggests a  clinically important change NOTE: New Reference Range  12/15/14)  CK, Total 92 [phone] NOTE: New Reference Range  12/15/14)  CPK-MB, Serum 1.8 (0.5-5.0 NOTE: New Reference Range  12/15/14)  Routine Coag:  18-Mar-16 07:35   D-Dimer, Quantitative > 7500 (INTERPRETATION <> Exclusion of Venous Thromboembolism (VTE) - OUTPATIENT ONLY       (Emergency Department or [place])             0-499 ng/ml (FEU)  : With a low to intermediate pretest                                  probability for VTE this test result                                  excludes the diagnosis of VTE.             > 499 ng/ml (FEU)  : VTE not excluded; additional work up                                  for VTE is required. <> Testing on Inpatients and Evaluation of Disseminated Intravascular        Coagulation (DIC)             Reference Range:  0-499 ng/ml (FEU))  Routine Hem:  18-Mar-16 07:56   WBC (CBC) 7.1  RBC (CBC)  4.35  Hemoglobin (CBC)  12.9  Hematocrit (CBC)  39.7  Platelet Count (CBC)  138 (Result(s) reported on 08 Jan 2015 at 08:23AM.)  MCV 91  MCH 29.6  MCHC 32.5  RDW 13.3   Medical Imaging Results:   Review Medical Imaging   PA and Lateral 08-Jan-2015 07:15:00: IMPRESSION:  No active cardiopulmonary disease.      Electronically Signed    By: Monte Fantasia M.D.    On: 01/08/2015 07:44         Verified By: Gilford Silvius, M.D., ANGIOGRAPHY Chest with for PE  08-Jan-2015 10:37:00: IMPRESSION:  There is a large burden right-sided pulmonary thromboembolism  associated with right heart strain. A tiny subsegmental pulmonary  embolus in the left lower lobe may be present. Critical  Value/emergent results were called by telephone at the time of  interpretation on 01/08/2015 at 11:06 am to Dr. Lenise Arena ,  who verbally acknowledged these results.      Electronically Signed    By: Marybelle Killings M.D.    On: 01/08/2015 11:06         Verified By: WUXLKGM. HOSS, M.D.,  Assessment and Plan: Impression:   PEs. Plan:  1. PEs: Patient's PEs appear to be unprovoked, although he is sedentary for multiple hours today given his current employment. His brother also has a history of DVT. Full hypercoagulable workup has been initiated is currently pending. No further intervention is needed at this time. Okay to discharge patient home on either Eliquis or Xarelto. He will require a minimum of 6 months of anticoagulation. Patient can follow-up in 3 months in the [place] for discussion of his workup and further evaluation. consult, call with questions.  Electronic Signatures: Delight Hoh (MD)  (Signed 18-Mar-16 16:02)  Authored: Note Type, CC/HPI, Review of Systems, ALLERGIES, Patient Family Social History, HOME MEDICATIONS, Vital Signs, Physical Exam, Lab Results Review, Rad Results Review, Assessment and Plan   Last Updated: 18-Mar-16 16:02 by Delight Hoh (MD)

## 2015-04-19 ENCOUNTER — Other Ambulatory Visit: Payer: Self-pay

## 2015-04-27 ENCOUNTER — Inpatient Hospital Stay: Payer: BC Managed Care – PPO | Attending: Oncology | Admitting: Oncology

## 2015-04-27 VITALS — BP 134/80 | HR 91 | Temp 98.4°F | Resp 20 | Ht 70.0 in | Wt 252.2 lb

## 2015-04-27 DIAGNOSIS — G4733 Obstructive sleep apnea (adult) (pediatric): Secondary | ICD-10-CM | POA: Diagnosis not present

## 2015-04-27 DIAGNOSIS — Z79899 Other long term (current) drug therapy: Secondary | ICD-10-CM | POA: Insufficient documentation

## 2015-04-27 DIAGNOSIS — I10 Essential (primary) hypertension: Secondary | ICD-10-CM | POA: Diagnosis not present

## 2015-04-27 DIAGNOSIS — D6859 Other primary thrombophilia: Secondary | ICD-10-CM | POA: Diagnosis not present

## 2015-04-27 DIAGNOSIS — I2699 Other pulmonary embolism without acute cor pulmonale: Secondary | ICD-10-CM | POA: Diagnosis not present

## 2015-04-27 DIAGNOSIS — Z7901 Long term (current) use of anticoagulants: Secondary | ICD-10-CM | POA: Diagnosis not present

## 2015-04-27 MED ORDER — APIXABAN 5 MG PO TABS: 5.0000 mg | ORAL_TABLET | Freq: Two times a day (BID) | ORAL | Status: DC

## 2015-04-27 NOTE — Progress Notes (Signed)
Patient here today for initial evaluation after being seen in hospital for PE, anticoagulation. Patient started on Eliquis, states he is tolerating medication well. Patient is without complaints today.

## 2015-05-05 NOTE — Progress Notes (Signed)
[name]  [phone] [phone]  ID: Grant Diaz OB: 1950-11-03  MR#: [id number]  XYB#:[id number]  Patient Care Team: Rodena Medin, FNP as PCP - General (Nurse Practitioner)  CHIEF COMPLAINT:  Chief Complaint  Patient presents with  . New Evaluation    hospital follow up for PE    INTERVAL HISTORY: Patient returns to clinic today for further evaluation in hospital follow-up. He is tolerating his Eliquis well without significant side effects. He has no neurologic complaints. He denies any fevers. He denies any chest pain or further shortness of breath. He has a good appetite and denies weight loss. He denies any nausea, vomiting, constipation, or diarrhea. He has no urinary complaints. Patient offers no further specific complaints.  REVIEW OF SYSTEMS:   Review of Systems  Constitutional: Negative.   Cardiovascular: Negative.   Gastrointestinal: Negative.     As per HPI. Otherwise, a complete review of systems is negatve.  PAST MEDICAL HISTORY: Past Medical History  Diagnosis Date  . Hypertension   . Joint pain   . Sleep apnea, obstructive   . Cancer     Prostate-radiation    PAST SURGICAL HISTORY: Past Surgical History  Procedure Laterality Date  . Gallbladder surgery  2003  . Radiation implant w/ ultrasound, male  2005    internal/external radiation for prostate cancer  . Laparoscopic gastric banding  2009    and hernia repair  . Umbilical hernia repair  2010  . Hose  2011  . Esophagogastroduodenoscopy N/A 05/28/2013    Procedure: ESOPHAGOGASTRODUODENOSCOPY (EGD);  Surgeon: Shann Medal, MD;  Location: [facility];  Service: General;  Laterality: N/A;    FAMILY HISTORY Family History  Problem Relation Age of Onset  . Diabetes Mother        ADVANCED DIRECTIVES:    HEALTH MAINTENANCE: History  Substance Use Topics  . Smoking status: Never Smoker   . Smokeless tobacco: Never Used  . Alcohol Use: No      Colonoscopy:  PAP:  Bone density:  Lipid panel:  No Known Allergies  Current Outpatient Prescriptions  Medication Sig Dispense Refill  . acetaminophen (TYLENOL) 325 MG tablet Take 650 mg by mouth every 6 (six) hours as needed.    Marland Kitchen amLODipine (NORVASC) 10 MG tablet Take 10 mg by mouth daily.      Marland Kitchen apixaban (ELIQUIS) 5 MG TABS tablet Take 1 tablet (5 mg total) by mouth 2 (two) times daily. 60 tablet 3  . Cholecalciferol (D3-1000) 1000 UNITS capsule Take 1,000 Units by mouth daily.    Marland Kitchen lisinopril (PRINIVIL,ZESTRIL) 20 MG tablet Take 20 mg by mouth daily.      . Multiple Vitamin (MULTIVITAMIN) capsule Take 1 capsule by mouth daily.      Marland Kitchen NAFTIN 2 % CREA     . traMADol (ULTRAM) 50 MG tablet Take by mouth 2 (two) times daily.    Mariane Baumgarten Calcium (STOOL SOFTENER PO) Take 3 capsules by mouth daily.       No current facility-administered medications for this visit.    OBJECTIVE: Filed Vitals:   04/27/15 1536  BP: 134/80  Pulse: 91  Temp: 98.4 F (36.9 C)  Resp: 20     Body mass index is 36.19 kg/(m^2).    ECOG FS:0 - Asymptomatic  General: Well-developed, well-nourished, no acute distress. Eyes: Pink conjunctiva, anicteric sclera. HEENT: Normocephalic, moist mucous membranes, clear oropharnyx. Lungs: Clear to auscultation bilaterally. Heart: Regular rate and rhythm. No rubs, murmurs,  or gallops. Abdomen: Soft, nontender, nondistended. No organomegaly noted, normoactive bowel sounds. Musculoskeletal: No edema, cyanosis, or clubbing. Neuro: Alert, answering all questions appropriately. Cranial nerves grossly intact. Skin: No rashes or petechiae noted. Psych: Normal affect. Lymphatics: No cervical, calvicular, axillary or inguinal LAD.   LAB RESULTS:  Lab Results  Component Value Date   NA 141 10/26/2008   K 4.2 10/26/2008   CL 107 10/26/2008   CO2 29 10/26/2008   GLUCOSE 83 10/26/2008   BUN 12 10/26/2008   CREATININE 0.78 10/26/2008   CALCIUM 8.9 10/26/2008    PROT 6.0 10/26/2008   ALBUMIN 3.5 10/26/2008   AST 39* 10/26/2008   ALT 52 10/26/2008   ALKPHOS 83 10/26/2008   BILITOT 0.9 10/26/2008   GFRNONAA >60 10/26/2008   GFRAA  10/26/2008    >60        The eGFR has been calculated using the MDRD equation. This calculation has not been validated in all clinical situations. eGFR's persistently <60 mL/min signify possible Chronic Kidney Disease.    Lab Results  Component Value Date   WBC 4.4 10/26/2008   NEUTROABS 2.8 10/26/2008   HGB 12.3* 10/26/2008   HCT 36.4* 10/26/2008   MCV 90.6 10/26/2008   PLT 175 10/26/2008     STUDIES: No results found.  ASSESSMENT: Bilateral pulmonary embolus.  PLAN:    1. PEs: Patient's PEs appear to be unprovoked, although he is sedentary for multiple hours today given his current employment. His brother also has a history of DVT. Full hypercoagulable workup is negative other than a mildly decreased protein C antigen. This is likely decreased secondary to his acute clot. Continue Eliquis for a total of 6 months. No follow-up is necessary. Please refer patient back if there are any questions or concerns.   Lloyd Huger, MD   05/05/2015 1:52 PM

## 2015-05-13 ENCOUNTER — Other Ambulatory Visit: Payer: Self-pay | Admitting: Orthopedic Surgery

## 2015-05-13 DIAGNOSIS — M1712 Unilateral primary osteoarthritis, left knee: Secondary | ICD-10-CM

## 2015-05-18 ENCOUNTER — Ambulatory Visit: Admission: RE | Admit: 2015-05-18 | Payer: BC Managed Care – PPO | Source: Ambulatory Visit

## 2015-05-19 ENCOUNTER — Telehealth: Payer: Self-pay | Admitting: *Deleted

## 2015-05-19 MED ORDER — APIXABAN 5 MG PO TABS
5.0000 mg | ORAL_TABLET | Freq: Two times a day (BID) | ORAL | Status: DC
Start: 2015-05-19 — End: 2015-09-29

## 2015-05-19 NOTE — Telephone Encounter (Signed)
After looking at [facility], RX was printed and not e scribed, so I resubmitted it abdn sent ot [facility] [address] as requested

## 2015-08-09 ENCOUNTER — Ambulatory Visit
Admission: RE | Admit: 2015-08-09 | Discharge: 2015-08-09 | Disposition: A | Discharge status: Home / Self Care | Payer: BC Managed Care – PPO | Source: Ambulatory Visit | Attending: Orthopedic Surgery | Admitting: Orthopedic Surgery

## 2015-08-09 DIAGNOSIS — M7122 Synovial cyst of popliteal space [Baker], left knee: Secondary | ICD-10-CM | POA: Insufficient documentation

## 2015-08-09 DIAGNOSIS — M1712 Unilateral primary osteoarthritis, left knee: Secondary | ICD-10-CM | POA: Diagnosis present

## 2015-09-29 ENCOUNTER — Encounter
Admission: RE | Admit: 2015-09-29 | Discharge: 2015-09-29 | Disposition: A | Discharge status: Home / Self Care | Payer: BC Managed Care – PPO | Source: Ambulatory Visit | Attending: Orthopedic Surgery | Admitting: Orthopedic Surgery

## 2015-09-29 DIAGNOSIS — Z01810 Encounter for preprocedural cardiovascular examination: Secondary | ICD-10-CM | POA: Diagnosis present

## 2015-09-29 DIAGNOSIS — Z01812 Encounter for preprocedural laboratory examination: Secondary | ICD-10-CM | POA: Insufficient documentation

## 2015-09-29 HISTORY — DX: Unspecified osteoarthritis, unspecified site: M19.90

## 2015-09-29 HISTORY — DX: Acute pancreatitis without necrosis or infection, unspecified: K85.90

## 2015-09-29 HISTORY — DX: Chronic kidney disease, unspecified: N18.9

## 2015-09-29 HISTORY — DX: Other pulmonary embolism without acute cor pulmonale: I26.99

## 2015-09-29 HISTORY — DX: Gastro-esophageal reflux disease without esophagitis: K21.9

## 2015-09-29 LAB — URINALYSIS COMPLETE WITH MICROSCOPIC (ARMC ONLY)
Bacteria, UA: NONE SEEN
Bilirubin Urine: NEGATIVE
Glucose, UA: NEGATIVE mg/dL
HGB URINE DIPSTICK: NEGATIVE
KETONES UR: NEGATIVE mg/dL
Leukocytes, UA: NEGATIVE
NITRITE: NEGATIVE
PROTEIN: NEGATIVE mg/dL
SPECIFIC GRAVITY, URINE: 1.023 (ref 1.005–1.030)
pH: 6 (ref 5.0–8.0)

## 2015-09-29 LAB — BASIC METABOLIC PANEL
Anion gap: 4 — ABNORMAL LOW (ref 5–15)
BUN: 24 mg/dL — AB (ref 6–20)
CALCIUM: 9 mg/dL (ref 8.9–10.3)
CHLORIDE: 105 mmol/L (ref 101–111)
CO2: 31 mmol/L (ref 22–32)
CREATININE: 0.87 mg/dL (ref 0.61–1.24)
Glucose, Bld: 103 mg/dL — ABNORMAL HIGH (ref 65–99)
Potassium: 3.8 mmol/L (ref 3.5–5.1)
SODIUM: 140 mmol/L (ref 135–145)

## 2015-09-29 LAB — CBC
HCT: 39.5 % — ABNORMAL LOW (ref 40.0–52.0)
Hemoglobin: 13.2 g/dL (ref 13.0–18.0)
MCH: 31 pg (ref 26.0–34.0)
MCHC: 33.3 g/dL (ref 32.0–36.0)
MCV: 93.1 fL (ref 80.0–100.0)
PLATELETS: 207 10*3/uL (ref 150–440)
RBC: 4.24 MIL/uL — AB (ref 4.40–5.90)
RDW: 13.1 % (ref 11.5–14.5)
WBC: 5.8 10*3/uL (ref 3.8–10.6)

## 2015-09-29 LAB — SEDIMENTATION RATE: SED RATE: 15 mm/h (ref 0–20)

## 2015-09-29 LAB — PROTIME-INR
INR: 1.02
PROTHROMBIN TIME: 13.6 s (ref 11.4–15.0)

## 2015-09-29 LAB — TYPE AND SCREEN
ABO/RH(D): A NEG
ANTIBODY SCREEN: NEGATIVE

## 2015-09-29 LAB — SURGICAL PCR SCREEN
MRSA, PCR: NEGATIVE
STAPHYLOCOCCUS AUREUS: POSITIVE — AB

## 2015-09-29 LAB — APTT: aPTT: 28 seconds (ref 24–36)

## 2015-09-29 NOTE — Patient Instructions (Signed)
  Your procedure is scheduled on: October 12, 2015 (Tuesday) Report to Day Surgery.[facility]) To find out your arrival time please call [phone] between 1PM - 3PM on October 11, 2015 (Monday).  Remember: Instructions that are not followed completely may result in serious medical risk, up to and including death, or upon the discretion of your surgeon and anesthesiologist your surgery may need to be rescheduled.    __x__ 1. Do not eat food or drink liquids after midnight. No gum chewing or hard candies.     ____ 2. No Alcohol for 24 hours before or after surgery.   ____ 3. Bring all medications with you on the day of surgery if instructed.    __x__ 4. Notify your doctor if there is any change in your medical condition     (cold, fever, infections).     Do not wear jewelry, make-up, hairpins, clips or nail polish.  Do not wear lotions, powders, or perfumes. You may wear deodorant.  Do not shave 48 hours prior to surgery. Men may shave face and neck.  Do not bring valuables to the hospital.    [facility] is not responsible for any belongings or valuables.               Contacts, dentures or bridgework may not be worn into surgery.  Leave your suitcase in the car. After surgery it may be brought to your room.  For patients admitted to the hospital, discharge time is determined by your                treatment team.   Patients discharged the day of surgery will not be allowed to drive home.   Please read over the following fact sheets that you were given:   MRSA Information and Surgical Site Infection Prevention   ____ Take these medicines the morning of surgery with A SIP OF WATER:    1. Amlodipine  2.   3.   4.  5.  6.  ____ Fleet Enema (as directed)   __x__ Use CHG Soap as directed  ____ Use inhalers on the day of surgery  ____ Stop metformin 2 days prior to surgery    ____ Take 1/2 of usual insulin dose the night before surgery and none on the morning of  surgery.   __x Stop Coumadin/Plavix/aspirin on (NO ASPIRIN)  ____ Stop Anti-inflammatories on (NO NSAIDS)   _x_ Stop supplements until after surgery.  (STOP TURMERIC NOW)  __x__ Bring C-Pap to the hospital.

## 2015-09-29 NOTE — Pre-Procedure Instructions (Deleted)
Positive MRSA results called and faxed to Dr. Leanor Kail, [facility] Bonito. office (results given to Kathryne Eriksson).

## 2015-09-30 LAB — ABO/RH: ABO/RH(D): A NEG

## 2015-09-30 NOTE — Pre-Procedure Instructions (Signed)
Positive staph results called and faxed to Dr. Rudene Christians office, (spoke to [facility])

## 2015-10-01 LAB — LATEX, IGE: Latex: <0.1 kU/L

## 2015-10-12 ENCOUNTER — Inpatient Hospital Stay
Admission: AD | Admit: 2015-10-12 | Discharge: 2015-10-14 | DRG: 470 | Disposition: A | Discharge status: Home / Self Care | Payer: BC Managed Care – PPO | Source: Ambulatory Visit | Attending: Orthopedic Surgery | Admitting: Orthopedic Surgery

## 2015-10-12 ENCOUNTER — Inpatient Hospital Stay: Payer: BC Managed Care – PPO

## 2015-10-12 ENCOUNTER — Encounter: Payer: Self-pay | Admitting: *Deleted

## 2015-10-12 ENCOUNTER — Inpatient Hospital Stay: Payer: BC Managed Care – PPO | Admitting: Anesthesiology

## 2015-10-12 ENCOUNTER — Encounter
Admission: AD | Disposition: A | Discharge status: Home / Self Care | Payer: Self-pay | Source: Ambulatory Visit | Attending: Orthopedic Surgery

## 2015-10-12 DIAGNOSIS — G4733 Obstructive sleep apnea (adult) (pediatric): Secondary | ICD-10-CM | POA: Diagnosis present

## 2015-10-12 DIAGNOSIS — M1712 Unilateral primary osteoarthritis, left knee: Secondary | ICD-10-CM | POA: Diagnosis present

## 2015-10-12 DIAGNOSIS — Z6834 Body mass index (BMI) 34.0-34.9, adult: Secondary | ICD-10-CM | POA: Diagnosis not present

## 2015-10-12 DIAGNOSIS — N189 Chronic kidney disease, unspecified: Secondary | ICD-10-CM | POA: Diagnosis present

## 2015-10-12 DIAGNOSIS — M1710 Unilateral primary osteoarthritis, unspecified knee: Secondary | ICD-10-CM | POA: Diagnosis present

## 2015-10-12 DIAGNOSIS — K219 Gastro-esophageal reflux disease without esophagitis: Secondary | ICD-10-CM | POA: Diagnosis present

## 2015-10-12 DIAGNOSIS — G8918 Other acute postprocedural pain: Secondary | ICD-10-CM

## 2015-10-12 DIAGNOSIS — Z87442 Personal history of urinary calculi: Secondary | ICD-10-CM | POA: Diagnosis not present

## 2015-10-12 DIAGNOSIS — R319 Hematuria, unspecified: Secondary | ICD-10-CM | POA: Diagnosis present

## 2015-10-12 DIAGNOSIS — Z86711 Personal history of pulmonary embolism: Secondary | ICD-10-CM

## 2015-10-12 DIAGNOSIS — I129 Hypertensive chronic kidney disease with stage 1 through stage 4 chronic kidney disease, or unspecified chronic kidney disease: Secondary | ICD-10-CM | POA: Diagnosis present

## 2015-10-12 HISTORY — PX: TOTAL KNEE ARTHROPLASTY: SHX125

## 2015-10-12 LAB — CBC
HEMATOCRIT: 37.1 % — AB (ref 40.0–52.0)
HEMOGLOBIN: 12.6 g/dL — AB (ref 13.0–18.0)
MCH: 31.4 pg (ref 26.0–34.0)
MCHC: 34.1 g/dL (ref 32.0–36.0)
MCV: 92.2 fL (ref 80.0–100.0)
PLATELETS: 189 10*3/uL (ref 150–440)
RBC: 4.02 MIL/uL — AB (ref 4.40–5.90)
RDW: 12.8 % (ref 11.5–14.5)
WBC: 7.7 10*3/uL (ref 3.8–10.6)

## 2015-10-12 LAB — CREATININE, SERUM
CREATININE: 0.78 mg/dL (ref 0.61–1.24)
GFR calc Af Amer: >60 mL/min (ref >60)
GFR calc non Af Amer: >60 mL/min (ref >60)

## 2015-10-12 SURGERY — ARTHROPLASTY, KNEE, TOTAL
Anesthesia: Monitor Anesthesia Care | Site: Knee | Laterality: Left | Wound class: Clean

## 2015-10-12 MED ORDER — ACETAMINOPHEN 650 MG RE SUPP: 650.0000 mg | Freq: Four times a day (QID) | RECTAL | Status: DC | PRN

## 2015-10-12 MED ORDER — BUPIVACAINE-EPINEPHRINE (PF) 0.25% -1:200000 IJ SOLN
INTRAMUSCULAR | Status: DC | PRN
  Administered 2015-10-12: 30 mL

## 2015-10-12 MED ORDER — NEOMYCIN-POLYMYXIN B GU 40-200000 IR SOLN
Status: AC
  Filled 2015-10-12: qty 20

## 2015-10-12 MED ORDER — KETAMINE HCL 10 MG/ML IJ SOLN
INTRAMUSCULAR | Status: DC | PRN
  Administered 2015-10-12: 50 mg via INTRAVENOUS

## 2015-10-12 MED ORDER — MENTHOL 3 MG MT LOZG
1.0000 | LOZENGE | OROMUCOSAL | Status: DC | PRN
  Filled 2015-10-12: qty 9

## 2015-10-12 MED ORDER — OXYCODONE HCL 5 MG PO TABS
5.0000 mg | ORAL_TABLET | ORAL | Status: DC | PRN
  Administered 2015-10-12: 5 mg via ORAL
  Administered 2015-10-12: 10 mg via ORAL
  Administered 2015-10-12 – 2015-10-13 (×2): 5 mg via ORAL
  Administered 2015-10-13: 10 mg via ORAL
  Administered 2015-10-13: 5 mg via ORAL
  Administered 2015-10-13 (×3): 10 mg via ORAL
  Administered 2015-10-13 (×2): 5 mg via ORAL
  Administered 2015-10-14 (×3): 10 mg via ORAL
  Filled 2015-10-12: qty 1
  Filled 2015-10-12 (×3): qty 2
  Filled 2015-10-12 (×3): qty 1
  Filled 2015-10-12: qty 2
  Filled 2015-10-12: qty 1
  Filled 2015-10-12 (×4): qty 2
  Filled 2015-10-12: qty 1
  Filled 2015-10-12: qty 2

## 2015-10-12 MED ORDER — DOCUSATE SODIUM 100 MG PO CAPS
100.0000 mg | ORAL_CAPSULE | Freq: Two times a day (BID) | ORAL | Status: DC
Start: 2015-10-12 — End: 2015-10-14
  Administered 2015-10-12 – 2015-10-14 (×4): 100 mg via ORAL
  Filled 2015-10-12 (×5): qty 1

## 2015-10-12 MED ORDER — PHENYLEPHRINE HCL 10 MG/ML IJ SOLN
INTRAMUSCULAR | Status: DC | PRN
  Administered 2015-10-12 (×4): 100 ug via INTRAVENOUS

## 2015-10-12 MED ORDER — BUPIVACAINE LIPOSOME 1.3 % IJ SUSP
INTRAMUSCULAR | Status: AC
  Filled 2015-10-12: qty 20

## 2015-10-12 MED ORDER — KETOROLAC TROMETHAMINE 30 MG/ML IJ SOLN
INTRAMUSCULAR | Status: DC | PRN
Start: 2015-10-12 — End: 2015-10-12
  Administered 2015-10-12: 30 mg via INTRAVENOUS

## 2015-10-12 MED ORDER — MAGNESIUM HYDROXIDE 400 MG/5ML PO SUSP
30.0000 mL | Freq: Every day | ORAL | Status: DC | PRN
  Administered 2015-10-13: 30 mL via ORAL
  Filled 2015-10-12 (×2): qty 30

## 2015-10-12 MED ORDER — SODIUM CHLORIDE 0.9 % IV SOLN
INTRAVENOUS | Status: DC
  Administered 2015-10-12 – 2015-10-13 (×3): via INTRAVENOUS

## 2015-10-12 MED ORDER — SODIUM CHLORIDE 0.9 % IV SOLN: INTRAVENOUS | Status: DC

## 2015-10-12 MED ORDER — ACETAMINOPHEN 325 MG PO TABS
650.0000 mg | ORAL_TABLET | Freq: Four times a day (QID) | ORAL | Status: DC | PRN
  Administered 2015-10-12 – 2015-10-13 (×2): 650 mg via ORAL
  Filled 2015-10-12 (×3): qty 2

## 2015-10-12 MED ORDER — METOCLOPRAMIDE HCL 5 MG/ML IJ SOLN: 5.0000 mg | Freq: Three times a day (TID) | INTRAMUSCULAR | Status: DC | PRN

## 2015-10-12 MED ORDER — PROPOFOL 500 MG/50ML IV EMUL
INTRAVENOUS | Status: DC | PRN
  Administered 2015-10-12: 50 ug/kg/min via INTRAVENOUS

## 2015-10-12 MED ORDER — LISINOPRIL 20 MG PO TABS
20.0000 mg | ORAL_TABLET | Freq: Every day | ORAL | Status: DC
  Administered 2015-10-12 – 2015-10-14 (×3): 20 mg via ORAL
  Filled 2015-10-12 (×3): qty 1

## 2015-10-12 MED ORDER — LACTATED RINGERS IV SOLN
INTRAVENOUS | Status: DC
  Administered 2015-10-12 (×3): via INTRAVENOUS
  Administered 2015-10-12: 75 mL/h via INTRAVENOUS

## 2015-10-12 MED ORDER — CEFAZOLIN SODIUM-DEXTROSE 2-3 GM-% IV SOLR
2.0000 g | Freq: Four times a day (QID) | INTRAVENOUS | Status: AC
  Administered 2015-10-12 – 2015-10-13 (×3): 2 g via INTRAVENOUS
  Filled 2015-10-12 (×3): qty 50

## 2015-10-12 MED ORDER — CEFAZOLIN SODIUM-DEXTROSE 2-3 GM-% IV SOLR: 2.0000 g | Freq: Once | INTRAVENOUS | Status: DC

## 2015-10-12 MED ORDER — ENOXAPARIN SODIUM 30 MG/0.3ML ~~LOC~~ SOLN
30.0000 mg | Freq: Two times a day (BID) | SUBCUTANEOUS | Status: DC
  Administered 2015-10-13 – 2015-10-14 (×3): 30 mg via SUBCUTANEOUS
  Filled 2015-10-12 (×3): qty 0.3

## 2015-10-12 MED ORDER — ZOLPIDEM TARTRATE 5 MG PO TABS
5.0000 mg | ORAL_TABLET | Freq: Every evening | ORAL | Status: DC | PRN
  Administered 2015-10-12 – 2015-10-13 (×2): 5 mg via ORAL
  Filled 2015-10-12 (×2): qty 1

## 2015-10-12 MED ORDER — CEFAZOLIN SODIUM-DEXTROSE 2-3 GM-% IV SOLR
INTRAVENOUS | Status: AC
  Administered 2015-10-12: 2 g via INTRAVENOUS
  Filled 2015-10-12: qty 50

## 2015-10-12 MED ORDER — METHOCARBAMOL 500 MG PO TABS: 500.0000 mg | ORAL_TABLET | Freq: Four times a day (QID) | ORAL | Status: DC | PRN

## 2015-10-12 MED ORDER — METHOCARBAMOL 1000 MG/10ML IJ SOLN
500.0000 mg | Freq: Four times a day (QID) | INTRAVENOUS | Status: DC | PRN
  Filled 2015-10-12: qty 5

## 2015-10-12 MED ORDER — ONDANSETRON HCL 4 MG/2ML IJ SOLN
4.0000 mg | Freq: Four times a day (QID) | INTRAMUSCULAR | Status: DC | PRN
  Administered 2015-10-12 – 2015-10-13 (×3): 4 mg via INTRAVENOUS
  Filled 2015-10-12 (×3): qty 2

## 2015-10-12 MED ORDER — OXYCODONE HCL 5 MG/5ML PO SOLN: 5.0000 mg | Freq: Once | ORAL | Status: DC | PRN

## 2015-10-12 MED ORDER — ADULT MULTIVITAMIN W/MINERALS CH
1.0000 | ORAL_TABLET | Freq: Every day | ORAL | Status: DC
  Administered 2015-10-12 – 2015-10-14 (×3): 1 via ORAL
  Filled 2015-10-12 (×3): qty 1

## 2015-10-12 MED ORDER — SODIUM CHLORIDE 0.9 % IV SOLN
INTRAVENOUS | Status: DC | PRN
  Administered 2015-10-12: 60 mL

## 2015-10-12 MED ORDER — FENTANYL CITRATE (PF) 100 MCG/2ML IJ SOLN: 25.0000 ug | INTRAMUSCULAR | Status: DC | PRN

## 2015-10-12 MED ORDER — MAGNESIUM CITRATE PO SOLN
1.0000 | Freq: Once | ORAL | Status: DC | PRN
  Filled 2015-10-12: qty 296

## 2015-10-12 MED ORDER — PHENOL 1.4 % MT LIQD
1.0000 | OROMUCOSAL | Status: DC | PRN
  Filled 2015-10-12: qty 177

## 2015-10-12 MED ORDER — MORPHINE SULFATE (PF) 10 MG/ML IV SOLN
INTRAVENOUS | Status: AC
  Filled 2015-10-12: qty 1

## 2015-10-12 MED ORDER — INFLUENZA VAC SPLIT QUAD 0.5 ML IM SUSY
0.5000 mL | PREFILLED_SYRINGE | INTRAMUSCULAR | Status: DC
  Filled 2015-10-12: qty 0.5

## 2015-10-12 MED ORDER — MORPHINE SULFATE 10 MG/ML IJ SOLN
INTRAMUSCULAR | Status: DC | PRN
  Administered 2015-10-12: 10 mg

## 2015-10-12 MED ORDER — BISACODYL 10 MG RE SUPP: 10.0000 mg | Freq: Every day | RECTAL | Status: DC | PRN

## 2015-10-12 MED ORDER — AMLODIPINE BESYLATE 10 MG PO TABS
10.0000 mg | ORAL_TABLET | Freq: Every day | ORAL | Status: DC
  Administered 2015-10-13 – 2015-10-14 (×2): 10 mg via ORAL
  Filled 2015-10-12 (×2): qty 1

## 2015-10-12 MED ORDER — MORPHINE SULFATE (PF) 2 MG/ML IV SOLN
2.0000 mg | INTRAVENOUS | Status: DC | PRN
  Administered 2015-10-12: 2 mg via INTRAVENOUS
  Filled 2015-10-12: qty 1

## 2015-10-12 MED ORDER — SODIUM CHLORIDE 0.9 % IJ SOLN
INTRAMUSCULAR | Status: AC
  Filled 2015-10-12: qty 100

## 2015-10-12 MED ORDER — OXYCODONE HCL 5 MG PO TABS: 5.0000 mg | ORAL_TABLET | Freq: Once | ORAL | Status: DC | PRN

## 2015-10-12 MED ORDER — METOCLOPRAMIDE HCL 10 MG PO TABS: 5.0000 mg | ORAL_TABLET | Freq: Three times a day (TID) | ORAL | Status: DC | PRN

## 2015-10-12 MED ORDER — HYDROCHLOROTHIAZIDE 12.5 MG PO CAPS
12.5000 mg | ORAL_CAPSULE | Freq: Every day | ORAL | Status: DC
  Administered 2015-10-12 – 2015-10-14 (×3): 12.5 mg via ORAL
  Filled 2015-10-12 (×3): qty 1

## 2015-10-12 MED ORDER — ONDANSETRON HCL 4 MG PO TABS
4.0000 mg | ORAL_TABLET | Freq: Four times a day (QID) | ORAL | Status: DC | PRN
  Administered 2015-10-13: 4 mg via ORAL
  Filled 2015-10-12: qty 1

## 2015-10-12 MED ORDER — BUPIVACAINE-EPINEPHRINE (PF) 0.25% -1:200000 IJ SOLN
INTRAMUSCULAR | Status: AC
  Filled 2015-10-12: qty 30

## 2015-10-12 MED ORDER — NEOMYCIN-POLYMYXIN B GU 40-200000 IR SOLN
Status: DC | PRN
  Administered 2015-10-12: 16 mL

## 2015-10-12 MED ORDER — MIDAZOLAM HCL 5 MG/5ML IJ SOLN
INTRAMUSCULAR | Status: DC | PRN
  Administered 2015-10-12: 2 mg via INTRAVENOUS

## 2015-10-12 MED ORDER — LISINOPRIL-HYDROCHLOROTHIAZIDE 20-12.5 MG PO TABS: 1.0000 | ORAL_TABLET | Freq: Every day | ORAL | Status: DC

## 2015-10-12 SURGICAL SUPPLY — 54 items
BANDAGE ACE 6X5 VEL STRL LF (GAUZE/BANDAGES/DRESSINGS) ×3 IMPLANT
BLADE SAW 1 (BLADE) ×3 IMPLANT
CANISTER SUCT 1200ML W/VALVE (MISCELLANEOUS) ×3 IMPLANT
CANISTER SUCT 3000ML (MISCELLANEOUS) ×6 IMPLANT
CAPT KNEE TOTAL 3 ×2 IMPLANT
CATH FOL LEG HOLDER (MISCELLANEOUS) ×3 IMPLANT
CATH TRAY METER 16FR LF (MISCELLANEOUS) ×3 IMPLANT
CEMENT HV SMART SET (Cement) ×6 IMPLANT
CHLORAPREP W/TINT 26ML (MISCELLANEOUS) ×6 IMPLANT
COOLER POLAR GLACIER W/PUMP (MISCELLANEOUS) ×3 IMPLANT
DRAPE INCISE IOBAN 66X45 STRL (DRAPES) ×6 IMPLANT
DRAPE SHEET LG 3/4 BI-LAMINATE (DRAPES) ×6 IMPLANT
ELECT CAUTERY BLADE 6.4 (BLADE) ×3 IMPLANT
GAUZE PETRO XEROFOAM 1X8 (MISCELLANEOUS) ×3 IMPLANT
GAUZE SPONGE 4X4 12PLY STRL (GAUZE/BANDAGES/DRESSINGS) ×3 IMPLANT
GLOVE BIOGEL PI IND STRL 9 (GLOVE) ×1 IMPLANT
GLOVE BIOGEL PI INDICATOR 9 (GLOVE) ×2
GLOVE SURG ORTHO 9.0 STRL STRW (GLOVE) ×3 IMPLANT
GOWN SPECIALTY ULTRA XL (MISCELLANEOUS) ×3 IMPLANT
GOWN STRL REUS W/ TWL LRG LVL3 (GOWN DISPOSABLE) ×2 IMPLANT
GOWN STRL REUS W/TWL LRG LVL3 (GOWN DISPOSABLE) ×6
HANDPIECE SUCTION TUBG SURGILV (MISCELLANEOUS) ×3 IMPLANT
HOOD PEEL AWAY FACE SHEILD DIS (HOOD) ×6 IMPLANT
IMMBOLIZER KNEE 19 BLUE UNIV (SOFTGOODS) ×3 IMPLANT
IV SET EXTENSION 6 LL TADAPT (SET/KITS/TRAYS/PACK) ×3 IMPLANT
KNEE MEDACTA TIBIAL/FEMORAL BL (Knees) ×2 IMPLANT
KNIFE SCULPS 14X20 (INSTRUMENTS) ×3 IMPLANT
NDL SAFETY 18GX1.5 (NEEDLE) ×3 IMPLANT
NDL SPNL 18GX3.5 QUINCKE PK (NEEDLE) ×1 IMPLANT
NDL SPNL 20GX3.5 QUINCKE YW (NEEDLE) ×1 IMPLANT
NEEDLE SPNL 18GX3.5 QUINCKE PK (NEEDLE) ×3 IMPLANT
NEEDLE SPNL 20GX3.5 QUINCKE YW (NEEDLE) ×3 IMPLANT
NS IRRIG 1000ML POUR BTL (IV SOLUTION) ×3 IMPLANT
PACK TOTAL KNEE (MISCELLANEOUS) ×3 IMPLANT
PAD GROUND ADULT SPLIT (MISCELLANEOUS) ×3 IMPLANT
PAD WRAPON POLAR KNEE (MISCELLANEOUS) ×1 IMPLANT
SOL .9 NS 3000ML IRR  AL (IV SOLUTION) ×2
SOL .9 NS 3000ML IRR AL (IV SOLUTION) ×1
SOL .9 NS 3000ML IRR UROMATIC (IV SOLUTION) ×1 IMPLANT
STAPLER SKIN PROX 35W (STAPLE) ×3 IMPLANT
STRAP SAFETY BODY (MISCELLANEOUS) ×3 IMPLANT
SUCTION FRAZIER TIP 10 FR DISP (SUCTIONS) ×3 IMPLANT
SUT DVC 2 QUILL PDO  T11 36X36 (SUTURE) ×2
SUT DVC 2 QUILL PDO T11 36X36 (SUTURE) ×1 IMPLANT
SUT DVC QUILL MONODERM 30X30 (SUTURE) ×3 IMPLANT
SUT ETHIBOND NAB CT1 #1 30IN (SUTURE) ×3 IMPLANT
SYR 20CC LL (SYRINGE) ×3 IMPLANT
SYR 50ML LL SCALE MARK (SYRINGE) ×3 IMPLANT
TOWER CARTRIDGE SMART MIX (DISPOSABLE) ×3 IMPLANT
WATER STERILE IRR 1000ML POUR (IV SOLUTION) ×3 IMPLANT
WRAPON POLAR PAD KNEE (MISCELLANEOUS) ×3
my knee femur cutting block ×1 IMPLANT
my knee tibial bone model ×1 IMPLANT
my knee tibial cutting block ×2 IMPLANT

## 2015-10-12 NOTE — OR Nursing (Signed)
pcr was positive for staph, not for mrsa.  No treatment, per patient

## 2015-10-12 NOTE — Anesthesia Procedure Notes (Signed)
Spinal Patient location during procedure: OR Start time: 10/12/2015 7:24 AM Staffing Resident/CRNA: Nelda Marseille Performed by: resident/CRNA  Preanesthetic Checklist Completed: patient identified, site marked, surgical consent, pre-op evaluation, timeout performed, IV checked, risks and benefits discussed and monitors and equipment checked Spinal Block Patient position: sitting Prep: Betadine Patient monitoring: heart rate, continuous pulse ox, blood pressure and cardiac monitor Approach: midline Location: L4-5 Injection technique: single-shot Needle Needle type: Whitacre and Introducer  Needle gauge: 24 G Needle length: 9 cm Additional Notes Negative paresthesia. Negative blood return. Positive free-flowing CSF. Expiration date of kit checked and confirmed. Patient tolerated procedure well, without complications.

## 2015-10-12 NOTE — Evaluation (Signed)
Physical Therapy Evaluation Patient Details Name: Grant Diaz MRN: [id number] DOB: [birth year] Today's Date: 10/12/2015   History of Present Illness  Pt admitted for L TKR on 10/12/2015.   Clinical Impression  Pt is a pleasant 64 year old male who was admitted for L TKR. Pt very motivated to perform therapy. Pt performs bed mobility, transfers, and ambulation with min assist and rw. Pt able to perform SLR with independence, does not require KI at this time. Pt demonstrates deficits with strength/ROM/mobility. Pt with heavy ER of L LE, educated pt on continued benefits of towel roll and trying to avoid excessive ER of L LE. Would benefit from skilled PT to address above deficits and promote optimal return to PLOF.      Follow Up Recommendations Home health PT;Supervision for mobility/OOB    Equipment Recommendations  Rolling walker with 5" wheels    Recommendations for Other Services       Precautions / Restrictions Precautions Precautions: Fall Restrictions Weight Bearing Restrictions: Yes LLE Weight Bearing: Weight bearing as tolerated      Mobility  Bed Mobility Overal bed mobility: Needs Assistance Bed Mobility: Supine to Sit     Supine to sit: Min assist     General bed mobility comments: bed mobility performed with safe technique and verbal cues. Once seated at EOB, pt able to sit with supervision  Transfers Overall transfer level: Needs assistance Equipment used: Rolling walker (2 wheeled) Transfers: Sit to/from Stand Sit to Stand: Min assist         General transfer comment: sit<>Stand with rw from elevated bed. Safe technique performed with cues for pushing from seated surface  Ambulation/Gait Ambulation/Gait assistance: Min assist Ambulation Distance (Feet): 3 Feet Assistive device: Rolling walker (2 wheeled) Gait Pattern/deviations: Step-to pattern     General Gait Details: ambulated using step to gait pattern. Cues given for sequencing of steps  and rw  Stairs            Wheelchair Mobility    Modified Rankin (Stroke Patients Only)       Balance Overall balance assessment: Needs assistance Sitting-balance support: Bilateral upper extremity supported Sitting balance-Leahy Scale: Good     Standing balance support: Bilateral upper extremity supported Standing balance-Leahy Scale: Good                               Pertinent Vitals/Pain Pain Assessment: Faces Faces Pain Scale: Hurts a little bit Pain Location: L knee Pain Descriptors / Indicators: Constant Pain Intervention(s): Limited activity within patient's tolerance    Home Living Family/patient expects to be discharged to:: Private residence Living Arrangements: Spouse/significant other Available Help at Discharge: Family Type of Home: House Home Access: Ramped entrance     Home Layout: One level Home Equipment: Toilet riser      Prior Function Level of Independence: Independent         Comments: limited houshold ambulator secondary to pain     Hand Dominance        Extremity/Trunk Assessment   Upper Extremity Assessment: Overall WFL for tasks assessed           Lower Extremity Assessment: Generalized weakness (L LE grossly 3/5 evident by ability to perform SLRs)         Communication   Communication: No difficulties  Cognition Arousal/Alertness: Awake/alert Behavior During Therapy: WFL for tasks assessed/performed Overall Cognitive Status: Within Functional Limits for tasks assessed  General Comments      Exercises Total Joint Exercises Goniometric ROM: 0-80 degrees; L LE AAROM Other Exercises Other Exercises: supine ther-ex performed including L ankle pumps, quad sets, SLRs, and knee flexion. All ther-ex x 10 reps with cues for correct technique. Min assist required      Assessment/Plan    PT Assessment Patient needs continued PT services  PT Diagnosis Difficulty  walking;Generalized weakness;Acute pain   PT Problem List Decreased strength;Decreased range of motion;Decreased balance;Decreased mobility;Pain  PT Treatment Interventions Gait training;Therapeutic exercise   PT Goals (Current goals can be found in the Care Plan section) Acute Rehab PT Goals Patient Stated Goal: to go home PT Goal Formulation: With patient Time For Goal Achievement: 10/26/15 Potential to Achieve Goals: Good    Frequency BID   Barriers to discharge        Co-evaluation               End of Session Equipment Utilized During Treatment: Gait belt Activity Tolerance: Patient tolerated treatment well Patient left: in chair;with chair alarm set Nurse Communication: Mobility status         Time: [id number] PT Time Calculation (min) (ACUTE ONLY): 34 min   Charges:   PT Evaluation $Initial PT Evaluation Tier I: 1 Procedure PT Treatments $Therapeutic Exercise: 8-22 mins   PT G Codes:        Candise Crabtree 2015/10/18, 5:16 PM  Greggory Stallion, PT, DPT [phone]

## 2015-10-12 NOTE — Progress Notes (Signed)
Blood noted to urine in tubing and foley bag, also seeping from end of penis. Denies pain, states mild discomfort. Paged MD. Per Dr. Rudene Christians, continue to monitor,may need to irrigate foley as needed to maintain patency, page MD if worsens. Monitor for output. May be related to pre-op trauma w/ insertion of F/C. Hold Lovenox in AM until MD evaluates.

## 2015-10-12 NOTE — Op Note (Signed)
10/12/2015  9:44 AM  PATIENT:  Grant Diaz  64 y.o. male  PRE-OPERATIVE DIAGNOSIS:  OSTEOARTHRITIS primary left knee osteoarthritis  POST-OPERATIVE DIAGNOSIS:  Same  PROCEDURE:  Procedure(s): TOTAL KNEE ARTHROPLASTY (Left)  SURGEON: Laurene Footman, MD  ASSISTANTS:  Rachelle Hora [facility]  ANESTHESIA:   spinal  EBL:  Total I/O In: 2000 [I.V.:2000] Out: 250 [Urine:200; Blood:50]  BLOOD ADMINISTERED:none  DRAINS: none   LOCAL MEDICATIONS USED:  MARCAINE    and OTHER morphine Toradol and exparel  SPECIMEN:  Source of Specimen:  Cut ends of bone  DISPOSITION OF SPECIMEN:  PATHOLOGY  COUNTS:  YES  TOURNIQUET:  75 min at 300 mmHg  IMPLANTS: Medacta GMK sphere left 5 femur 4 tibia baseplate with 11 mm insert and to patella all components cemented  DICTATION: .Dragon Dictation patient brought the operating room and after adequate spinal anesthesia was obtained left leg was prepped and draped in sterile fashion was turned by the upper thigh after patient identification and timeout procedures were completed the tourniquet was raised. Midline skin incision and flexion was performed followed by medial parapatellar arthrotomy. Inspection knee revealed exposed bone in all 3 compartments patchy on the lateral compartment but eburnated bone medially and the patellofemoral joint superiorly. The old anterior cruciate ligament reconstruction was torn with no fibers intact. After exposure the proximal tibia the MYKNEE proximal tibia cutting guide was applied and proximal tibia cut carried out. This is an carried out on the femoral side with the medial part compartment being quite sclerotic. PCL was excised at this time and I'm checking extension and flexion gaps there were appropriate. Posterior horns of menisci excised and the 4 in 1 cutting guide was applied to the distal femur anterior to posterior and chamfer cuts made with no notching. The 4 tibia baseplate was placed and proximal drilling  carried out. The residual anterior cruciate ligament on the tibial side was debrided out of L. Going to the femoral side size 5 trial was placed with distal drill holes made low millimeters insert gave good stability with slight opening to valgus stress next the trial components were removed and the trochlear groove cut was created this point the bony surfaces were thoroughly irrigated and dried following cutting the patella with the patellar cutting guide 3 drill holes were made and a sized to size 3 patella. Local anesthetic as noted above was infiltrated around the knee as well as longer acting local. The bony surfaces were dried and the components were cemented in place with excess cement being removed knee held in extension as the cement set. The arthrotomy was repaired using Ethibond medially followed by a heavy Quill to a Quill subcutaneously and skin staples tourniquet was let down prior to wound closure the minimal bleeding present in the knee. Xeroform 4 x 4's ABDs web roll Polar Care were applied followed by an Ace wrap  PLAN OF CARE: Admit to inpatient   PATIENT DISPOSITION:  PACU - hemodynamically stable.

## 2015-10-12 NOTE — Anesthesia Preprocedure Evaluation (Signed)
Anesthesia Evaluation  Patient identified by MRN, date of birth, ID band Patient awake    Reviewed: Allergy & Precautions, H&P , NPO status , Patient's Chart, lab work & pertinent test results  History of Anesthesia Complications (+) PONV and history of anesthetic complications  Airway Mallampati: II  TM Distance: >3 FB Neck ROM: full    Dental no notable dental hx. (+) Teeth Intact   Pulmonary neg shortness of breath, sleep apnea and Continuous Positive Airway Pressure Ventilation ,    Pulmonary exam normal breath sounds clear to auscultation       Cardiovascular Exercise Tolerance: Good hypertension, (-) angina(-) Past MI and (-) DOE Normal cardiovascular exam Rhythm:regular Rate:Normal     Neuro/Psych negative neurological ROS  negative psych ROS   GI/Hepatic Neg liver ROS, GERD  Controlled,  Endo/Other  negative endocrine ROS  Renal/GU Renal disease  negative genitourinary   Musculoskeletal  (+) Arthritis ,   Abdominal   Peds  Hematology negative hematology ROS (+)   Anesthesia Other Findings Past Medical History:   Hypertension                                                 Joint pain                                                   Cancer ([place])                                                   Comment:Prostate-radiation   Chronic kidney disease                                         Comment:kidney stones   GERD (gastroesophageal reflux disease)                       Arthritis                                                    Pancreatitis                                    2013           Comment:[facility]   Pulmonary embolus ([place])                         March 2016     Comment:Bilateral pulmonary embolus, [facility]   Sleep apnea, obstructive                                       Comment:cpap  Past Surgical History:  GALLBLADDER SURGERY                              2003         RADIATION IMPLANT W/  ULTRASOUND, MALE            2005           Comment:internal/external radiation for prostate cancer   LAPAROSCOPIC GASTRIC BANDING                     2009           Comment:and hernia repair   UMBILICAL HERNIA REPAIR                          2010         hose                                             2011         ESOPHAGOGASTRODUODENOSCOPY                      N/A 05/28/2013       Comment:Procedure: ESOPHAGOGASTRODUODENOSCOPY (EGD);                Surgeon: Shann Medal, MD;  Location: [facility]               ENDOSCOPY;  Service: General;  Laterality: N/A;   CHOLECYSTECTOMY                                               KNEE ARTHROSCOPY                                Bilateral [id number]    HERNIA REPAIR                                                BMI    Body Mass Index   34.14 kg/m 2      Reproductive/Obstetrics negative OB ROS                             Anesthesia Physical Anesthesia Plan  ASA: III  Anesthesia Plan: Spinal and MAC   Post-op Pain Management:    Induction:   Airway Management Planned:   Additional Equipment:   Intra-op Plan:   Post-operative Plan:   Informed Consent: I have reviewed the patients History and Physical, chart, labs and discussed the procedure including the risks, benefits and alternatives for the proposed anesthesia with the patient or authorized representative who has indicated his/her understanding and acceptance.   Dental Advisory Given  Plan Discussed with: Anesthesiologist, CRNA and Surgeon  Anesthesia Plan Comments:         Anesthesia Quick Evaluation

## 2015-10-12 NOTE — H&P (Signed)
Reviewed paper H+P, will be scanned into chart. No changes noted.  

## 2015-10-12 NOTE — Transfer of Care (Signed)
Immediate Anesthesia Transfer of Care Note  Patient: Grant Diaz  Procedure(s) Performed: Procedure(s): TOTAL KNEE ARTHROPLASTY (Left)  Patient Location: PACU  Anesthesia Type:Spinal  Level of Consciousness: awake, alert  and oriented  Airway & Oxygen Therapy: Patient Spontanous Breathing and Patient connected to face mask oxygen  Post-op Assessment: Report given to RN and Post -op Vital signs reviewed and stable  Post vital signs: Reviewed and stable  Last Vitals:  Filed Vitals:   10/12/15 0614  BP: 152/82  Pulse: 71  Temp: 36.7 C  Resp: 16    Complications: No apparent anesthesia complications

## 2015-10-13 LAB — URINALYSIS COMPLETE WITH MICROSCOPIC (ARMC ONLY)
Bilirubin Urine: NEGATIVE
Glucose, UA: NEGATIVE mg/dL
Ketones, ur: NEGATIVE mg/dL
LEUKOCYTES UA: NEGATIVE
Nitrite: NEGATIVE
PROTEIN: 30 mg/dL — AB
SPECIFIC GRAVITY, URINE: 1.014 (ref 1.005–1.030)
pH: 6 (ref 5.0–8.0)

## 2015-10-13 LAB — BASIC METABOLIC PANEL
ANION GAP: 4 — AB (ref 5–15)
BUN: 15 mg/dL (ref 6–20)
CALCIUM: 7.9 mg/dL — AB (ref 8.9–10.3)
CO2: 28 mmol/L (ref 22–32)
Chloride: 104 mmol/L (ref 101–111)
Creatinine, Ser: 0.71 mg/dL (ref 0.61–1.24)
GFR calc Af Amer: >60 mL/min (ref >60)
GFR calc non Af Amer: >60 mL/min (ref >60)
GLUCOSE: 110 mg/dL — AB (ref 65–99)
POTASSIUM: 3.5 mmol/L (ref 3.5–5.1)
Sodium: 136 mmol/L (ref 135–145)

## 2015-10-13 LAB — CBC
HEMATOCRIT: 33.1 % — AB (ref 40.0–52.0)
Hemoglobin: 11.3 g/dL — ABNORMAL LOW (ref 13.0–18.0)
MCH: 31.7 pg (ref 26.0–34.0)
MCHC: 34.2 g/dL (ref 32.0–36.0)
MCV: 92.6 fL (ref 80.0–100.0)
Platelets: 172 10*3/uL (ref 150–440)
RBC: 3.58 MIL/uL — ABNORMAL LOW (ref 4.40–5.90)
RDW: 12.8 % (ref 11.5–14.5)
WBC: 7.8 10*3/uL (ref 3.8–10.6)

## 2015-10-13 LAB — OCCULT BLOOD X 1 CARD TO LAB, STOOL: FECAL OCCULT BLD: NEGATIVE

## 2015-10-13 NOTE — Care Management Note (Addendum)
Case Management Note  Patient Details  Name: Grant Diaz MRN: [id number] Date of Birth: Oct 05, 1951  Subjective/Objective:   POD # 1. Met with patient at bedside. He lives at home with his wife and prior to admission, patient was independent and active. Needs a walker, refused a BSC.  Requested walker be delivered to room by [place]. Patient has no agency preference for home PT. Referral to [place]. Pharmacy _[facility], [place]  [phone]. Called Lovenox 40 mg # 14.                 Action/Plan:   Expected Discharge Date:                  Expected Discharge Plan:  [place]  In-House Referral:     Discharge planning Services  CM Consult  Post Acute Care Choice:  Durable Medical Equipment, Home Health Choice offered to:  Patient, Spouse  DME Arranged:  3-N-1, Walker rolling DME Agency:  [place]:  PT [place]:  [place]  Status of Service:  In process, will continue to follow  Medicare Important Message Given:    Date Medicare IM Given:    Medicare IM give by:    Date Additional Medicare IM Given:    Additional Medicare Important Message give by:     If discussed at [place] of Stay Meetings, dates discussed:    Additional Comments:  Jolly Mango, RN 10/13/2015, 2:10 PM

## 2015-10-13 NOTE — Clinical Social Work Note (Signed)
Clinical Education officer, museum received consult for New SNF. PT is recommending HHPT. RNCM is aware and following. CSW will sign off as no further needs identified. Please reconsult if a need arises prior to discharge.   Darden Dates, MSW, LCSW  Clinical Social Worker  [phone]

## 2015-10-13 NOTE — Anesthesia Postprocedure Evaluation (Signed)
Anesthesia Post Note  Patient: Grant Diaz  Procedure(s) Performed: Procedure(s) (LRB): TOTAL KNEE ARTHROPLASTY (Left)  Patient location during evaluation: Other Anesthesia Type: Spinal Level of consciousness: oriented and awake and alert Pain management: pain level controlled Vital Signs Assessment: post-procedure vital signs reviewed and stable Respiratory status: spontaneous breathing, respiratory function stable and patient connected to nasal cannula oxygen Cardiovascular status: blood pressure returned to baseline and stable Postop Assessment: no headache and no backache Anesthetic complications: no    Last Vitals:  Filed Vitals:   10/13/15 0416 10/13/15 0739  BP: 125/71 137/66  Pulse: 87 67  Temp: 36.8 C 36.9 C  Resp: 20 16    Last Pain:  Filed Vitals:   10/13/15 0739  PainSc: 2                  Precious Haws [name]

## 2015-10-13 NOTE — Evaluation (Signed)
Occupational Therapy Evaluation Patient Details Name: ANGIE HOGG MRN: [id number] DOB: 1951-09-21 Today's Date: 10/13/2015    History of Present Illness This patient is a 64 year old male who came to [facility] for a L TKR   Clinical Impression   This patient is a 64 year old male who came to [facility] for a L total knee replacement.  Patient lives in a one story home with a ramp to enter.  He had been independent with basic ADL and and limited functional mobility. He demonstrated ability to dress his lower body with contact guard assist and verbal cues for technique and safety. No further Occupational Therapy needed.      Follow Up Recommendations       Equipment Recommendations       Recommendations for Other Services       Precautions / Restrictions Precautions Precautions: Fall Restrictions LLE Weight Bearing: Weight bearing as tolerated      Mobility Bed Mobility                  Transfers                 General transfer comment: sit to stand and stand to sit contact guard and verbal cues for safety.    Balance                                            ADL                                         General ADL Comments: Before had been ndependent with basic ADL, mobility somewhat limited.  Patient taught and practiced with hip kit to donn/doff sock, then demonstrated he could donn/doff sock on operative leg with out assistive device and donned pants with out assistive device (except walker) He did need cues for safety and technique.     Vision     Perception     Praxis      Pertinent Vitals/Pain Pain Assessment: 0-10 Pain Score: 3  Pain Location: left knee     Hand Dominance     Extremity/Trunk Assessment Upper Extremity Assessment Upper Extremity Assessment: Overall WFL for tasks assessed   Lower Extremity Assessment Lower Extremity Assessment: Defer to PT evaluation        Communication Communication Communication: No difficulties   Cognition Arousal/Alertness: Awake/alert Behavior During Therapy: WFL for tasks assessed/performed Overall Cognitive Status: Within Functional Limits for tasks assessed                     General Comments       Exercises       Shoulder Instructions      Home Living Family/patient expects to be discharged to:: Private residence Living Arrangements: Spouse/significant other Available Help at Discharge: Family Type of Home: House Home Access: Ramped entrance     Home Layout: One level               Home Equipment: Toilet riser          Prior Functioning/Environment Level of Independence: Independent             OT Diagnosis: Acute pain   OT Problem List:     OT  Treatment/Interventions:      OT Goals(Current goals can be found in the care plan section) Acute Rehab OT Goals Patient Stated Goal: To go home  OT Frequency:     Barriers to D/C:            Co-evaluation              End of Session Equipment Utilized During Treatment:  (hip kit)  Activity Tolerance:   Patient left: in chair;with call bell/phone within reach;with chair alarm set;with family/visitor present   Time: 2715-6648 OT Time Calculation (min): 21 min Charges:  OT General Charges $OT Visit: 1 Procedure OT Evaluation $Initial OT Evaluation Tier I: 1 Procedure OT Treatments $Self Care/Home Management : 8-22 mins G-Codes:    Myrene Galas, MS/OTR/L  10/13/2015, 10:05 AM

## 2015-10-13 NOTE — Progress Notes (Signed)
Physical Therapy Treatment Patient Details Name: Grant Diaz MRN: [id number] DOB: 06-04-1951 Today's Date: 10/13/2015    History of Present Illness This patient is a 64 year old male who came to [facility] for a L TKR    PT Comments    Pt is making good progress towards goals with increased ambulation distance with rw. Pt with increased fluid gait sequencing and good motivation to participate. Excellent progress with there-ex.  Follow Up Recommendations  Home health PT;Supervision for mobility/OOB     Equipment Recommendations  Rolling walker with 5" wheels    Recommendations for Other Services       Precautions / Restrictions Precautions Precautions: Fall Restrictions Weight Bearing Restrictions: Yes LLE Weight Bearing: Weight bearing as tolerated    Mobility  Bed Mobility               General bed mobility comments: Pt received in chair, therefore bed mobility not performed.   Transfers Overall transfer level: Needs assistance Equipment used: Rolling walker (2 wheeled) Transfers: Sit to/from Stand Sit to Stand: Min assist         General transfer comment: Pt able to stand from lower surface with min assist required. Pt performs safe technique.  Ambulation/Gait Ambulation/Gait assistance: Min guard Ambulation Distance (Feet): 220 Feet Assistive device: Rolling walker (2 wheeled) Gait Pattern/deviations: Step-through pattern     General Gait Details: improved gait pattern performed this date with cues for fluid gait pattern and sequencing. Pt also able to look up when cued. Pt still demonstrates slow gait speed.   Stairs            Wheelchair Mobility    Modified Rankin (Stroke Patients Only)       Balance                                    Cognition Arousal/Alertness: Awake/alert Behavior During Therapy: WFL for tasks assessed/performed Overall Cognitive Status: Within Functional Limits for tasks assessed                       Exercises Other Exercises Other Exercises: seated ther-ex performed including L ankle pumps, quad sets, LAQ, SAQ, SLR, and hip abd/add. All ther-ex performed x 12 reps with cues for correct technique, min assist required for SLR. Other Exercises: Pt ambulated to bathroom with positive BM. CGA for set up and cues for sequencing.    General Comments        Pertinent Vitals/Pain Pain Score: 4  Pain Location: L knee Pain Descriptors / Indicators: Constant Pain Intervention(s): Limited activity within patient's tolerance    Home Living                      Prior Function            PT Goals (current goals can now be found in the care plan section) Acute Rehab PT Goals Patient Stated Goal: To go home PT Goal Formulation: With patient Time For Goal Achievement: 10/26/15 Potential to Achieve Goals: Good Progress towards PT goals: Progressing toward goals    Frequency  BID    PT Plan Current plan remains appropriate    Co-evaluation             End of Session Equipment Utilized During Treatment: Gait belt Activity Tolerance: Patient limited by pain Patient left: in chair;with chair alarm set  Time: [id number] PT Time Calculation (min) (ACUTE ONLY): 45 min  Charges:  $Gait Training: 8-22 mins $Therapeutic Exercise: 8-22 mins $Therapeutic Activity: 8-22 mins                    G Codes:      Shondale Quinley 2015-10-30, 3:41 PM  Greggory Stallion, PT, DPT [phone]

## 2015-10-13 NOTE — Progress Notes (Signed)
   Subjective: 1 Day Post-Op Procedure(s) (LRB): TOTAL KNEE ARTHROPLASTY (Left) Patient reports pain as mild.   Patient is well, and has had no acute complaints or problems. Foley removed, mild hematuria. Able to urinate on his own.  We will continue therapy today.  Plan is to go Home after hospital stay.  Objective: Vital signs in last 24 hours: Temp:  [96.1 F (35.6 C)-98.2 F (36.8 C)] 98.2 F (36.8 C) (12/21 0416) Pulse Rate:  [59-87] 87 (12/21 0416) Resp:  [14-20] 20 (12/21 0416) BP: (105-135)/(53-79) 125/71 mmHg (12/21 0416) SpO2:  [97 %-100 %] 98 % (12/21 0416) FiO2 (%):  [21 %] 21 % (12/20 1105)  Intake/Output from previous day: 12/20 0701 - 12/21 0700 In: 4495 [P.O.:840; I.V.:3505; IV Piggyback:150] Out: 1525 [id number]; Blood:50] Intake/Output this shift:     Recent Labs  10/12/15 1125 10/13/15 0550  HGB 12.6* 11.3*    Recent Labs  10/12/15 1125 10/13/15 0550  WBC 7.7 7.8  RBC 4.02* 3.58*  HCT 37.1* 33.1*  PLT 189 172    Recent Labs  10/12/15 1125 10/13/15 0550  NA  --  136  K  --  3.5  CL  --  104  CO2  --  28  BUN  --  15  CREATININE 0.78 0.71  GLUCOSE  --  110*  CALCIUM  --  7.9*   No results for input(s): LABPT, INR in the last 72 hours.  EXAM General - Patient is Alert, Appropriate and Oriented Extremity - Neurovascular intact Sensation intact distally Intact pulses distally Dorsiflexion/Plantar flexion intact Dressing - dressing C/D/I and no drainage Motor Function - intact, moving foot and toes well on exam.   Past Medical History  Diagnosis Date  . Hypertension   . Joint pain   . Cancer ([place])     Prostate-radiation  . Chronic kidney disease     kidney stones  . GERD (gastroesophageal reflux disease)   . Arthritis   . Pancreatitis 2013    [facility]  . Pulmonary embolus [facility]) March 2016    Bilateral pulmonary embolus, [facility]  . Sleep apnea, obstructive     cpap    Assessment/Plan:   1 Day Post-Op Procedure(s)  (LRB): TOTAL KNEE ARTHROPLASTY (Left) Active Problems:   Primary osteoarthritis of knee  Estimated body mass index is 34.15 kg/(m^2) as calculated from the following:   Height as of this encounter: 5\' 10"  (1.778 m).   Weight as of this encounter: 107.956 kg (238 lb). Advance diet Up with therapy  Needs BM Recheck labs in the am  DVT Prophylaxis - Lovenox, Foot Pumps and TED hose Weight-Bearing as tolerated to left leg D/C O2 and Pulse OX and try on Room Air  T. Rachelle Hora, PA-C [place] 10/13/2015, 7:01 AM

## 2015-10-13 NOTE — Progress Notes (Signed)
Physical Therapy Treatment Patient Details Name: Grant Diaz MRN: [id number] DOB: 10/08/1951 Today's Date: 10/13/2015    History of Present Illness This patient is a 64 year old male who came to [facility] for a L TKR    PT Comments    Pt is making good progress towards goals with increased distance noted this session. Pt does complain of increased pain with ambulation. ER noted in L hip/knee in supine/and with ambulation. Paged PA questioning foam heel protector or KI indicated? Good tolerance with ROM and endurance with there-ex. Pt very motivated to work with therapy. Needs further training on gait sequencing.  Follow Up Recommendations  Home health PT;Supervision for mobility/OOB     Equipment Recommendations  Rolling walker with 5" wheels    Recommendations for Other Services       Precautions / Restrictions Precautions Precautions: Fall Restrictions Weight Bearing Restrictions: Yes LLE Weight Bearing: Weight bearing as tolerated    Mobility  Bed Mobility Overal bed mobility: Needs Assistance Bed Mobility: Supine to Sit     Supine to sit: Min assist     General bed mobility comments: safe technique performed for bed mobility. Cues and assist given for scooting out towards EOB.   Transfers Overall transfer level: Needs assistance Equipment used: Rolling walker (2 wheeled) Transfers: Sit to/from Stand Sit to Stand: Min assist         General transfer comment: elevated bed and min assist required for sit<>Stand. RW used for assistance with cues for correct technique.  Ambulation/Gait Ambulation/Gait assistance: Min guard Ambulation Distance (Feet): 80 Feet Assistive device: Rolling walker (2 wheeled) Gait Pattern/deviations: Step-to pattern     General Gait Details: ambulated using rw and step to gait pattern. Heavy cues for sequencing given as pt keeps rw too close to body. Pt also needs reminders to look up during ambulation. Antalgic pattern noted limiting  further distance.   Stairs            Wheelchair Mobility    Modified Rankin (Stroke Patients Only)       Balance                                    Cognition Arousal/Alertness: Awake/alert Behavior During Therapy: WFL for tasks assessed/performed Overall Cognitive Status: Within Functional Limits for tasks assessed                      Exercises Total Joint Exercises Goniometric ROM: L knee AAROM: 0-82 degrees Other Exercises Other Exercises: supine ther-ex performed including L LE ankle pumps, quad set, SLRs, hip abd/add, and SAQ x 12 reps with min assist. Pt also performed seated ther-ex including knee flexion x 10 reps    General Comments        Pertinent Vitals/Pain Pain Assessment: 0-10 Pain Score: 4  Pain Location: L knee Pain Descriptors / Indicators: Constant Pain Intervention(s): Limited activity within patient's tolerance;Patient requesting pain meds-RN notified    Home Living Family/patient expects to be discharged to:: Private residence Living Arrangements: Spouse/significant other Available Help at Discharge: Family Type of Home: House Home Access: Ramped entrance   [place]: One [place]: Toilet riser      Prior Function Level of Independence: Independent          PT Goals (current goals can now be found in the care plan section) Acute Rehab PT Goals Patient Stated Goal: To  go home PT Goal Formulation: With patient Time For Goal Achievement: 10/26/15 Potential to Achieve Goals: Good Progress towards PT goals: Progressing toward goals    Frequency  BID    PT Plan Current plan remains appropriate    Co-evaluation             End of Session Equipment Utilized During Treatment: Gait belt Activity Tolerance: Patient limited by pain Patient left: in chair;with chair alarm set     Time: [id number] PT Time Calculation (min) (ACUTE ONLY): 29 min  Charges:  $Gait Training: 8-22  mins $Therapeutic Exercise: 8-22 mins                    G Codes:      Myrl Bynum 10/22/15, 10:52 AM  Greggory Stallion, PT, DPT [phone]

## 2015-10-13 NOTE — Progress Notes (Signed)
Noted blood in toilet basin after pt had BM and urine. Noted urine in urinal is not bloody, dark amber in color. Pt states he has not had blood in stool before. Notified MD. H&H stable. New orders per Dr. Rudene Christians for U/A and [place] stool, CBC tomorrow AM.

## 2015-10-14 LAB — BASIC METABOLIC PANEL
Anion gap: 6 (ref 5–15)
BUN: 15 mg/dL (ref 6–20)
CALCIUM: 8.3 mg/dL — AB (ref 8.9–10.3)
CO2: 31 mmol/L (ref 22–32)
CREATININE: 0.75 mg/dL (ref 0.61–1.24)
Chloride: 105 mmol/L (ref 101–111)
GFR calc non Af Amer: >60 mL/min (ref >60)
Glucose, Bld: 97 mg/dL (ref 65–99)
Potassium: 3.4 mmol/L — ABNORMAL LOW (ref 3.5–5.1)
SODIUM: 142 mmol/L (ref 135–145)

## 2015-10-14 LAB — SURGICAL PATHOLOGY

## 2015-10-14 LAB — CBC
HCT: 34.8 % — ABNORMAL LOW (ref 40.0–52.0)
Hemoglobin: 11.4 g/dL — ABNORMAL LOW (ref 13.0–18.0)
MCH: 30.6 pg (ref 26.0–34.0)
MCHC: 32.8 g/dL (ref 32.0–36.0)
MCV: 93.3 fL (ref 80.0–100.0)
Platelets: 176 10*3/uL (ref 150–440)
RBC: 3.73 MIL/uL — AB (ref 4.40–5.90)
RDW: 12.7 % (ref 11.5–14.5)
WBC: 8 10*3/uL (ref 3.8–10.6)

## 2015-10-14 MED ORDER — OXYCODONE HCL 5 MG PO TABS: 5.0000 mg | ORAL_TABLET | ORAL | Status: DC | PRN

## 2015-10-14 MED ORDER — ENOXAPARIN SODIUM 40 MG/0.4ML ~~LOC~~ SOLN
40.0000 mg | SUBCUTANEOUS | Status: DC
Start: 2015-10-14 — End: 2017-03-06

## 2015-10-14 NOTE — Care Management (Addendum)
Spoke with patient regarding rolling walker delivery. He states it is defective. I have asked Will with [facility] to bring another one as patient is discharging home today. [facility] does not open until 0900 and patient states he has not been notified of cost for Lovenox- this RNCM will follow up when pharmacy opens. Arville Go (Tim/Jerry) notified of patient plan for discharge to home today.   Lovenox $16.76. Rolling walker delivered to room. Case closed.

## 2015-10-14 NOTE — Progress Notes (Signed)
Physical Therapy Treatment Patient Details Name: Grant Diaz MRN: [id number] DOB: May 21, 1951 Today's Date: 10/14/2015    History of Present Illness This patient is a 64 year old male who came to [facility] for a L TKR    PT Comments    Pt is making good progress towards goals and has met goals with acute care discharge at this time. Pt has ramped entrance and has support of spouse at home. Good endurance with ROM/exercises at this time. Pt very motivated to perform therapy. Still requires cues for fluid gait pattern, however able to perform after gait training. Pt safe to dc home at this time.  Follow Up Recommendations  Home health PT;Supervision for mobility/OOB     Equipment Recommendations  Rolling walker with 5" wheels    Recommendations for Other Services       Precautions / Restrictions Precautions Precautions: Fall Restrictions Weight Bearing Restrictions: Yes LLE Weight Bearing: Weight bearing as tolerated Other Position/Activity Restrictions: gave knee booklet    Mobility  Bed Mobility Overal bed mobility: Needs Assistance Bed Mobility: Supine to Sit     Supine to sit: Min guard     General bed mobility comments: safe technique performed with no cues required for mobility. Assist needed to hold L LE to keep from fast descent to ground  Transfers Overall transfer level: Needs assistance Equipment used: Rolling walker (2 wheeled) Transfers: Sit to/from Stand Sit to Stand: Min assist         General transfer comment: transfer performed from lowered bed with safe technique, no cues required for correct hand placement.  Ambulation/Gait Ambulation/Gait assistance: Min guard Ambulation Distance (Feet): 220 Feet Assistive device: Rolling walker (2 wheeled) Gait Pattern/deviations: Step-through pattern     General Gait Details: Takes increased time to get flow of gait pattern including fluid motion, however with heavy repeated cues, pt able to demonstrate  reciprocal gait pattern with symmetrical step length. Pt continues to ambulate at slow speed, completing 10' walk test in approx 10 seconds, however safe technique performed.   Stairs            Wheelchair Mobility    Modified Rankin (Stroke Patients Only)       Balance                                    Cognition Arousal/Alertness: Awake/alert Behavior During Therapy: WFL for tasks assessed/performed Overall Cognitive Status: Within Functional Limits for tasks assessed                      Exercises Total Joint Exercises Goniometric ROM: L knee AAROM: 0-91 degrees Other Exercises Other Exercises: supine ther-ex performed x 15 reps on L LE including ankle pumps, quad sets, SAQ, SLRs, knee flexion stretches, and hip abd/add. Min assist required for SLRs. Other Exercises: Pt ambulated to bathroom. CGA for set up and cues for sequencing.    General Comments        Pertinent Vitals/Pain Pain Assessment: 0-10 Pain Score: 5  Pain Location: L knee Pain Descriptors / Indicators: Operative site guarding Pain Intervention(s): Limited activity within patient's tolerance;Patient requesting pain meds-RN notified    Home Living                      Prior Function            PT Goals (current goals can now  be found in the care plan section) Acute Rehab PT Goals Patient Stated Goal: To go home PT Goal Formulation: With patient Time For Goal Achievement: 10/26/15 Potential to Achieve Goals: Good Progress towards PT goals: Progressing toward goals    Frequency  BID    PT Plan Current plan remains appropriate    Co-evaluation             End of Session Equipment Utilized During Treatment: Gait belt Activity Tolerance: Patient limited by pain Patient left: in chair;with chair alarm set     Time: [id number] PT Time Calculation (min) (ACUTE ONLY): 38 min  Charges:  $Gait Training: 8-22 mins $Therapeutic Exercise: 8-22  mins $Therapeutic Activity: 8-22 mins                    G Codes:      [place] Dani November 05, 2015, 10:51 AM  Greggory Stallion, PT, DPT [phone]

## 2015-10-14 NOTE — Discharge Summary (Signed)
Physician Discharge Summary  Patient ID: Grant Diaz MRN: [id number] DOB/AGE: [birth year] 66 y.o.  Admit date: 10/12/2015 Discharge date: 10/14/2015  Admission Diagnoses:  OSTEOARTHRITIS   Discharge Diagnoses: Patient Active Problem List   Diagnosis Date Noted  . Primary osteoarthritis of knee 10/12/2015  . Morbid obesity ([place]) 04/28/2011    Past Medical History  Diagnosis Date  . Hypertension   . Joint pain   . 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 Chronic kidney disease     kidney stones  . GERD (gastroesophageal reflux disease)   . Arthritis   . Pancreatitis 2013    [facility]  . Pulmonary embolus [facility]) March 2016    Bilateral pulmonary embolus, [facility]  . Sleep apnea, obstructive     cpap     Transfusion: none   Consultants (if any):    Discharged Condition: Improved  Hospital Course: Grant Diaz is an 64 y.o. male who was admitted 10/12/2015 with a diagnosis of left knee OA and went to the operating room on 10/12/2015 and underwent the above named procedures.    Surgeries: Procedure(s): TOTAL KNEE ARTHROPLASTY on 10/12/2015 Patient tolerated the surgery well. Taken to PACU where she was stabilized and then transferred to the orthopedic floor.  Started on Lovenox 30 q 12 hrs. Foot pumps applied bilaterally at 80 mm. Heels elevated on bed with rolled towels. No evidence of DVT. Negative [place]. Physical therapy started on day #1 for gait training and transfer. OT started day #1 for ADL and assisted devices.  Patient's foley was d/c on day #1. Patient's IV was d/c on day #2.  On post op day #2 patient was stable and ready for discharge to home with HHPT.  Implants: Medacta GMK sphere left 5 femur 4 tibia baseplate with 11 mm insert and to patella all components cemented  He was given perioperative antibiotics:  Anti-infectives    Start     Dose/Rate Route Frequency Ordered Stop   10/12/15 1530  ceFAZolin (ANCEF) IVPB 2 g/50 mL premix     2 g 100 mL/hr over  30 Minutes Intravenous Every 6 hours 10/12/15 1104 10/13/15 0453   10/12/15 0200  ceFAZolin (ANCEF) IVPB 2 g/50 mL premix  Status:  Discontinued     2 g 100 mL/hr over 30 Minutes Intravenous  Once 10/12/15 0155 10/12/15 1104    .  He was given sequential compression devices, early ambulation, and lovenox for DVT prophylaxis.  He benefited maximally from the hospital stay and there were no complications.    Recent vital signs:  Filed Vitals:   10/13/15 2012 10/14/15 0601  BP: 142/84 142/82  Pulse: 98 87  Temp: 98.4 F (36.9 C) 98 F (36.7 C)  Resp: 18 18    Recent laboratory studies:  Lab Results  Component Value Date   HGB 11.4* 10/14/2015   HGB 11.3* 10/13/2015   HGB 12.6* 10/12/2015   Lab Results  Component Value Date   WBC 8.0 10/14/2015   PLT 176 10/14/2015   Lab Results  Component Value Date   INR 1.02 09/29/2015   Lab Results  Component Value Date   NA 142 10/14/2015   K 3.4* 10/14/2015   CL 105 10/14/2015   CO2 31 10/14/2015   BUN 15 10/14/2015   CREATININE 0.75 10/14/2015   GLUCOSE 97 10/14/2015    Discharge Medications:     Medication List    TAKE these medications        acetaminophen 325  MG tablet  Commonly known as:  TYLENOL  Take 650 mg by mouth every 6 (six) hours as needed.     amLODipine 10 MG tablet  Commonly known as:  NORVASC  Take 10 mg by mouth daily.     D3-1000 1000 UNITS capsule  Generic drug:  Cholecalciferol  Take 1,000 Units by mouth daily.     enoxaparin 40 MG/0.4ML injection  Commonly known as:  LOVENOX  Inject 0.4 mLs (40 mg total) into the skin daily.     lisinopril-hydrochlorothiazide 20-12.5 MG tablet  Commonly known as:  PRINZIDE,ZESTORETIC  Take 1 tablet by mouth daily.     multivitamin capsule  Take 1 capsule by mouth daily.     NAFTIN 2 % Crea  Generic drug:  Naftifine HCl  Apply 1 application topically as needed.     oxyCODONE 5 MG immediate release tablet  Commonly known as:  Oxy IR/ROXICODONE   Take 1-2 tablets (5-10 mg total) by mouth every 3 (three) hours as needed for breakthrough pain.     traMADol 50 MG tablet  Commonly known as:  ULTRAM  Take 50 mg by mouth every 8 (eight) hours as needed.     TURMERIC PO  Take 1 capsule by mouth daily.        Diagnostic Studies: Dg Knee 1-2 Views Left  Oct 24, 2015  CLINICAL DATA:  Post left knee replacement EXAM: LEFT KNEE - 1-2 VIEW COMPARISON:  08/09/2015 FINDINGS: Two portable views of the left knee submitted. There is left knee prosthesis with anatomic alignment. Postsurgical changes are noted with periarticular soft tissue air. Midline anterior skin staples. IMPRESSION: Left knee prosthesis with anatomic alignment. Postsurgical changes are noted. Electronically Signed   By: Lahoma Crocker M.D.   On: 10-24-2015 10:16    Disposition:         Follow-up Information    Follow up with MENZ,MICHAEL, MD In 2 weeks.   Specialty:  Orthopedic Surgery   Why:  For staple removal and skin check   Contact information:   [place] [address] [phone]        Signed: Dorise Hiss [facility] 10/14/2015, 7:59 AM

## 2015-10-14 NOTE — Progress Notes (Signed)
   Subjective: 2 Days Post-Op Procedure(s) (LRB): TOTAL KNEE ARTHROPLASTY (Left) Patient reports pain as mild.   Patient is well, and has had no acute complaints or problems.  We will continue therapy today.  Plan is to go Home after hospital stay.  Objective: Vital signs in last 24 hours: Temp:  [98 F (36.7 C)-98.4 F (36.9 C)] 98 F (36.7 C) (12/22 0601) Pulse Rate:  [67-98] 87 (12/22 0601) Resp:  [16-18] 18 (12/22 0601) BP: (135-142)/(66-84) 142/82 mmHg (12/22 0601) SpO2:  [96 %-100 %] 98 % (12/22 0601)  Intake/Output from previous day: 12/21 0701 - 12/22 0700 In: 1200 [P.O.:1200] Out: 1320 [Urine:1320] Intake/Output this shift:     Recent Labs  10/12/15 1125 10/13/15 0550 10/14/15 0557  HGB 12.6* 11.3* 11.4*    Recent Labs  10/13/15 0550 10/14/15 0557  WBC 7.8 8.0  RBC 3.58* 3.73*  HCT 33.1* 34.8*  PLT 172 176    Recent Labs  10/13/15 0550 10/14/15 0557  NA 136 142  K 3.5 3.4*  CL 104 105  CO2 28 31  BUN 15 15  CREATININE 0.71 0.75  GLUCOSE 110* 97  CALCIUM 7.9* 8.3*   No results for input(s): LABPT, INR in the last 72 hours.  EXAM General - Patient is Alert, Appropriate and Oriented Extremity - Neurovascular intact Sensation intact distally Intact pulses distally Dorsiflexion/Plantar flexion intact Dressing - dressing C/D/I and no drainage, dressing changed Motor Function - intact, moving foot and toes well on exam.   Past Medical History  Diagnosis Date  . Hypertension   . Joint pain   . 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 Chronic kidney disease     kidney stones  . GERD (gastroesophageal reflux disease)   . Arthritis   . Pancreatitis 2013    [facility]  . Pulmonary embolus [facility]) March 2016    Bilateral pulmonary embolus, [facility]  . Sleep apnea, obstructive     cpap    Assessment/Plan:   2 Days Post-Op Procedure(s) (LRB): TOTAL KNEE ARTHROPLASTY (Left) Active Problems:   Primary osteoarthritis of knee  Estimated body mass  index is 34.15 kg/(m^2) as calculated from the following:   Height as of this encounter: 5\' 10"  (1.778 m).   Weight as of this encounter: 107.956 kg (238 lb). Advance diet Up with therapy  Discharge home with HHPT pending progress with PT.  Follow up with [place] ortho in 2 weeks  DVT Prophylaxis - Lovenox, Foot Pumps and TED hose Weight-Bearing as tolerated to left leg D/C O2 and Pulse OX and try on Room Air  T. Rachelle Hora, PA-C [place] 10/14/2015, 7:36 AM

## 2015-10-14 NOTE — Discharge Instructions (Signed)

## 2016-08-08 ENCOUNTER — Encounter (HOSPITAL_COMMUNITY): Payer: Self-pay

## 2017-03-08 ENCOUNTER — Other Ambulatory Visit: Payer: BC Managed Care – PPO

## 2017-03-09 ENCOUNTER — Encounter
Admission: RE | Admit: 2017-03-09 | Discharge: 2017-03-09 | Disposition: A | Discharge status: Home / Self Care | Payer: BC Managed Care – PPO | Source: Ambulatory Visit | Attending: Orthopedic Surgery | Admitting: Orthopedic Surgery

## 2017-03-09 DIAGNOSIS — Z01812 Encounter for preprocedural laboratory examination: Secondary | ICD-10-CM | POA: Insufficient documentation

## 2017-03-09 DIAGNOSIS — I10 Essential (primary) hypertension: Secondary | ICD-10-CM | POA: Insufficient documentation

## 2017-03-09 DIAGNOSIS — Z01810 Encounter for preprocedural cardiovascular examination: Secondary | ICD-10-CM | POA: Insufficient documentation

## 2017-03-09 LAB — BASIC METABOLIC PANEL
Anion gap: 6 (ref 5–15)
BUN: 24 mg/dL — AB (ref 6–20)
CALCIUM: 8.9 mg/dL (ref 8.9–10.3)
CHLORIDE: 106 mmol/L (ref 101–111)
CO2: 27 mmol/L (ref 22–32)
Creatinine, Ser: 0.82 mg/dL (ref 0.61–1.24)
GFR calc non Af Amer: >60 mL/min (ref >60)
GLUCOSE: 84 mg/dL (ref 65–99)
Potassium: 3.5 mmol/L (ref 3.5–5.1)
Sodium: 139 mmol/L (ref 135–145)

## 2017-03-09 NOTE — OR Nursing (Signed)
Pt has appt at Dr Rudene Christians office - pt instructed to ask if he needs to stop aspirin and when to stop it

## 2017-03-09 NOTE — OR Nursing (Signed)
PT INSTRUCTED NOT TO TAKE NSAID

## 2017-03-09 NOTE — Patient Instructions (Signed)
Your procedure is scheduled on: Mar 15, 2017 THURSDAY  Report to [place] COME THRU REVOLVING DOOR  To find out your arrival time please call [phone] between 1PM - 3PM on Wednesday Mar 14, 2017   Remember: Instructions that are not followed completely may result in serious medical risk, up to and including death, or upon the discretion of your surgeon and anesthesiologist your surgery may need to be rescheduled.   __X__ 1. Do not eat food or drink liquids after midnight. No gum chewing or hard candies.      __X__ 2. No alcohol for 24 hours before or after surgery.    .   __X__ 3. Bring all medications with you on the day of surgery if instructed. BRING ANY NEW MEDICATION    __X__ 4. Notify your doctor if there is any change in your medical condition (cold, fever,                           infections).    Informe a su mdico si hay algn cambio en su condicin mdica (resfriado, fiebre, infecciones).   Do not wear jewelry    Do not wear lotions, powders, or perfumes. You may wear deodorant.              Men may shave face and neck.     Do not bring valuables to the hospital.     [facility] is not responsible for any belongings or valuables.                 Contacts, dentures or bridgework may not be worn into surgery.    .  Patients discharged the day of surgery will not be allowed to drive home.    Please read over the following fact sheets that you were given: :  CHG SOAP INSTRUCTIONS   ___X_ Take these medicines the morning of surgery with A SIP OF WATER:           1.AMLODIPINE  2. PRILOSEC TAKE ONE DOSE NIGHT BEFORE SURGERY AND MORNING OF SURGERY  3.   4.       5.  6.  ____ Fleet Enema (as directed)          Enema de Fleet (segn lo indicado)    __X__ Use CHG Soap as directed                     __X__ Stop ASPIRIN AS INSTRUCTED BY MD            _X___ Stop Anti-inflammatories UNTIL AFTER SURGERY.  __X__ Stop supplements until  after surgery TURMERIC,             __X__ Bring C-Pap to the hospital          [facility]

## 2017-03-15 ENCOUNTER — Encounter
Admission: RE | Disposition: A | Discharge status: Home / Self Care | Payer: Self-pay | Source: Ambulatory Visit | Attending: Orthopedic Surgery

## 2017-03-15 ENCOUNTER — Encounter: Payer: Self-pay | Admitting: *Deleted

## 2017-03-15 ENCOUNTER — Ambulatory Visit: Payer: BC Managed Care – PPO | Admitting: Anesthesiology

## 2017-03-15 ENCOUNTER — Ambulatory Visit
Admission: RE | Admit: 2017-03-15 | Discharge: 2017-03-15 | Disposition: A | Discharge status: Home / Self Care | Payer: BC Managed Care – PPO | Source: Ambulatory Visit | Attending: Orthopedic Surgery | Admitting: Orthopedic Surgery

## 2017-03-15 DIAGNOSIS — M67431 Ganglion, right wrist: Secondary | ICD-10-CM | POA: Insufficient documentation

## 2017-03-15 DIAGNOSIS — Z9989 Dependence on other enabling machines and devices: Secondary | ICD-10-CM | POA: Diagnosis not present

## 2017-03-15 DIAGNOSIS — K219 Gastro-esophageal reflux disease without esophagitis: Secondary | ICD-10-CM | POA: Insufficient documentation

## 2017-03-15 DIAGNOSIS — G4730 Sleep apnea, unspecified: Secondary | ICD-10-CM | POA: Insufficient documentation

## 2017-03-15 DIAGNOSIS — Z923 Personal history of irradiation: Secondary | ICD-10-CM | POA: Diagnosis not present

## 2017-03-15 DIAGNOSIS — G5601 Carpal tunnel syndrome, right upper limb: Secondary | ICD-10-CM | POA: Insufficient documentation

## 2017-03-15 DIAGNOSIS — Z8546 Personal history of malignant neoplasm of prostate: Secondary | ICD-10-CM | POA: Diagnosis not present

## 2017-03-15 DIAGNOSIS — Z7982 Long term (current) use of aspirin: Secondary | ICD-10-CM | POA: Insufficient documentation

## 2017-03-15 DIAGNOSIS — Z791 Long term (current) use of non-steroidal anti-inflammatories (NSAID): Secondary | ICD-10-CM | POA: Diagnosis not present

## 2017-03-15 DIAGNOSIS — I10 Essential (primary) hypertension: Secondary | ICD-10-CM | POA: Insufficient documentation

## 2017-03-15 DIAGNOSIS — Z79899 Other long term (current) drug therapy: Secondary | ICD-10-CM | POA: Insufficient documentation

## 2017-03-15 HISTORY — PX: CARPAL TUNNEL RELEASE: SHX101

## 2017-03-15 HISTORY — PX: GANGLION CYST EXCISION: SHX1691

## 2017-03-15 SURGERY — CARPAL TUNNEL RELEASE
Anesthesia: General | Site: Wrist | Laterality: Right | Wound class: Clean

## 2017-03-15 MED ORDER — ONDANSETRON HCL 4 MG/2ML IJ SOLN: 4.0000 mg | Freq: Four times a day (QID) | INTRAMUSCULAR | Status: DC | PRN

## 2017-03-15 MED ORDER — FENTANYL CITRATE (PF) 100 MCG/2ML IJ SOLN
INTRAMUSCULAR | Status: DC | PRN
  Administered 2017-03-15: 100 ug via INTRAVENOUS

## 2017-03-15 MED ORDER — DEXAMETHASONE SODIUM PHOSPHATE 10 MG/ML IJ SOLN
INTRAMUSCULAR | Status: DC | PRN
  Administered 2017-03-15: 10 mg via INTRAVENOUS

## 2017-03-15 MED ORDER — ACETAMINOPHEN 10 MG/ML IV SOLN
INTRAVENOUS | Status: AC
  Filled 2017-03-15: qty 100

## 2017-03-15 MED ORDER — BUPIVACAINE HCL (PF) 0.5 % IJ SOLN
INTRAMUSCULAR | Status: AC
  Filled 2017-03-15: qty 30

## 2017-03-15 MED ORDER — MIDAZOLAM HCL 2 MG/2ML IJ SOLN
INTRAMUSCULAR | Status: DC | PRN
  Administered 2017-03-15: 2 mg via INTRAVENOUS

## 2017-03-15 MED ORDER — FENTANYL CITRATE (PF) 100 MCG/2ML IJ SOLN: 25.0000 ug | INTRAMUSCULAR | Status: DC | PRN

## 2017-03-15 MED ORDER — PROPOFOL 10 MG/ML IV BOLUS
INTRAVENOUS | Status: AC
Start: 2017-03-15 — End: ?
  Filled 2017-03-15: qty 20

## 2017-03-15 MED ORDER — METOCLOPRAMIDE HCL 10 MG PO TABS: 5.0000 mg | ORAL_TABLET | Freq: Three times a day (TID) | ORAL | Status: DC | PRN

## 2017-03-15 MED ORDER — SODIUM CHLORIDE 0.9 % IV SOLN: INTRAVENOUS | Status: DC

## 2017-03-15 MED ORDER — METOCLOPRAMIDE HCL 5 MG/ML IJ SOLN: 5.0000 mg | Freq: Three times a day (TID) | INTRAMUSCULAR | Status: DC | PRN

## 2017-03-15 MED ORDER — ONDANSETRON HCL 4 MG PO TABS: 4.0000 mg | ORAL_TABLET | Freq: Four times a day (QID) | ORAL | Status: DC | PRN

## 2017-03-15 MED ORDER — LIDOCAINE HCL (CARDIAC) 20 MG/ML IV SOLN
INTRAVENOUS | Status: DC | PRN
  Administered 2017-03-15: 100 mg via INTRAVENOUS

## 2017-03-15 MED ORDER — FENTANYL CITRATE (PF) 100 MCG/2ML IJ SOLN
INTRAMUSCULAR | Status: AC
  Filled 2017-03-15: qty 2

## 2017-03-15 MED ORDER — HYDROCODONE-ACETAMINOPHEN 5-325 MG PO TABS
1.0000 | ORAL_TABLET | Freq: Four times a day (QID) | ORAL | Status: DC | PRN
  Administered 2017-03-15: 1 via ORAL

## 2017-03-15 MED ORDER — LIDOCAINE HCL (PF) 2 % IJ SOLN
INTRAMUSCULAR | Status: AC
Start: 2017-03-15 — End: ?
  Filled 2017-03-15: qty 2

## 2017-03-15 MED ORDER — ACETAMINOPHEN 10 MG/ML IV SOLN
INTRAVENOUS | Status: DC | PRN
Start: 2017-03-15 — End: 2017-03-15
  Administered 2017-03-15: 1000 mg via INTRAVENOUS

## 2017-03-15 MED ORDER — PROPOFOL 10 MG/ML IV BOLUS
INTRAVENOUS | Status: DC | PRN
  Administered 2017-03-15: 180 mg via INTRAVENOUS

## 2017-03-15 MED ORDER — LACTATED RINGERS IV SOLN
INTRAVENOUS | Status: DC
  Administered 2017-03-15 (×2): via INTRAVENOUS

## 2017-03-15 MED ORDER — HYDROCODONE-ACETAMINOPHEN 5-325 MG PO TABS: 1.0000 | ORAL_TABLET | ORAL | Status: DC | PRN

## 2017-03-15 MED ORDER — HYDROCODONE-ACETAMINOPHEN 5-325 MG PO TABS
ORAL_TABLET | ORAL | Status: AC
  Administered 2017-03-15: 1 via ORAL
  Filled 2017-03-15: qty 1

## 2017-03-15 MED ORDER — ONDANSETRON HCL 4 MG/2ML IJ SOLN: 4.0000 mg | Freq: Once | INTRAMUSCULAR | Status: DC | PRN

## 2017-03-15 MED ORDER — BUPIVACAINE HCL (PF) 0.5 % IJ SOLN
INTRAMUSCULAR | Status: DC | PRN
  Administered 2017-03-15 (×2): 10 mL

## 2017-03-15 MED ORDER — ONDANSETRON HCL 4 MG/2ML IJ SOLN
INTRAMUSCULAR | Status: DC | PRN
  Administered 2017-03-15: 4 mg via INTRAVENOUS

## 2017-03-15 MED ORDER — HYDROCODONE-ACETAMINOPHEN 5-325 MG PO TABS: 1.0000 | ORAL_TABLET | Freq: Four times a day (QID) | ORAL | 0 refills | Status: DC | PRN

## 2017-03-15 MED ORDER — MIDAZOLAM HCL 2 MG/2ML IJ SOLN
INTRAMUSCULAR | Status: AC
  Filled 2017-03-15: qty 2

## 2017-03-15 SURGICAL SUPPLY — 37 items
BANDAGE ACE 3X5.8 VEL STRL LF (GAUZE/BANDAGES/DRESSINGS) ×4 IMPLANT
BANDAGE ELASTIC 3 LF NS (GAUZE/BANDAGES/DRESSINGS) ×4 IMPLANT
BNDG CMPR MED 5X3 ELC HKLP NS (GAUZE/BANDAGES/DRESSINGS) ×2
CANISTER SUCT 1200ML W/VALVE (MISCELLANEOUS) ×4 IMPLANT
CAST PADDING 3X4FT ST 30246 (SOFTGOODS) ×2
CHLORAPREP W/TINT 26ML (MISCELLANEOUS) ×4 IMPLANT
CUFF TOURN 18 STER (MISCELLANEOUS) IMPLANT
DRSG TELFA 4X3 1S NADH ST (GAUZE/BANDAGES/DRESSINGS) ×2 IMPLANT
ELECT CAUTERY NDL 2.0 MIC (NEEDLE) ×2 IMPLANT
ELECT CAUTERY NEEDLE 2.0 MIC (NEEDLE) ×4 IMPLANT
GAUZE PETRO XEROFOAM 1X8 (MISCELLANEOUS) ×4 IMPLANT
GAUZE SPONGE 4X4 12PLY STRL (GAUZE/BANDAGES/DRESSINGS) ×4 IMPLANT
GLOVE BIOGEL PI IND STRL 6.5 (GLOVE) IMPLANT
GLOVE BIOGEL PI IND STRL 7.0 (GLOVE) IMPLANT
GLOVE BIOGEL PI INDICATOR 6.5 (GLOVE) ×4
GLOVE BIOGEL PI INDICATOR 7.0 (GLOVE) ×6
GLOVE SURG SYN 9.0  PF PI (GLOVE) ×2
GLOVE SURG SYN 9.0 PF PI (GLOVE) ×2 IMPLANT
GOWN SRG 2XL LVL 4 RGLN SLV (GOWNS) ×2 IMPLANT
GOWN STRL NON-REIN 2XL LVL4 (GOWNS) ×4
GOWN STRL REUS W/ TWL LRG LVL3 (GOWN DISPOSABLE) ×2 IMPLANT
GOWN STRL REUS W/TWL LRG LVL3 (GOWN DISPOSABLE) ×12
KIT RM TURNOVER STRD PROC AR (KITS) ×4 IMPLANT
NS IRRIG 500ML POUR BTL (IV SOLUTION) ×4 IMPLANT
PACK EXTREMITY ARMC (MISCELLANEOUS) ×4 IMPLANT
PAD CAST CTTN 3X4 STRL (SOFTGOODS) ×2 IMPLANT
PAD CAST CTTN 4X4 STRL (SOFTGOODS) ×2 IMPLANT
PADDING CAST COTTON 3X4 STRL (SOFTGOODS) ×2
PADDING CAST COTTON 4X4 STRL (SOFTGOODS) ×4
SPLINT CAST 1 STEP 3X12 (MISCELLANEOUS) ×4 IMPLANT
SUT ETHILON 4-0 (SUTURE) ×4
SUT ETHILON 4-0 FS2 18XMFL BLK (SUTURE) ×2
SUT ETHILON 5-0 FS-2 18 BLK (SUTURE) ×4 IMPLANT
SUT MNCRL 4-0 (SUTURE) ×4
SUT MNCRL 4-018XMFL (SUTURE) ×2
SUTURE ETHLN 4-0 FS2 18XMF BLK (SUTURE) ×2 IMPLANT
SUTURE MNCRL 4-018XMF (SUTURE) ×2 IMPLANT

## 2017-03-15 NOTE — H&P (Signed)
Reviewed paper H+P, will be scanned into chart. Patient examined No changes noted.  

## 2017-03-15 NOTE — Anesthesia Preprocedure Evaluation (Signed)
Anesthesia Evaluation  Patient identified by MRN, date of birth, ID band Patient awake    Reviewed: Allergy & Precautions, NPO status , Patient's Chart, lab work & pertinent test results  History of Anesthesia Complications Negative for: history of anesthetic complications  Airway Mallampati: I       Dental   Pulmonary sleep apnea and Continuous Positive Airway Pressure Ventilation ,  S/p P.E. Several years ago. No further problems.          Cardiovascular hypertension, Pt. on medications      Neuro/Psych negative neurological ROS     GI/Hepatic GERD  Medicated and Controlled,  Endo/Other    Renal/GU Renal InsufficiencyRenal disease     Musculoskeletal   Abdominal   Peds  Hematology   Anesthesia Other Findings   Reproductive/Obstetrics                             Anesthesia Physical Anesthesia Plan  ASA: II  Anesthesia Plan: General   Post-op Pain Management:    Induction: Intravenous  Airway Management Planned: LMA  Additional Equipment:   Intra-op Plan:   Post-operative Plan:   Informed Consent: I have reviewed the patients History and Physical, chart, labs and discussed the procedure including the risks, benefits and alternatives for the proposed anesthesia with the patient or authorized representative who has indicated his/her understanding and acceptance.     Plan Discussed with:   Anesthesia Plan Comments:         Anesthesia Quick Evaluation

## 2017-03-15 NOTE — Transfer of Care (Signed)
Immediate Anesthesia Transfer of Care Note  Patient: Grant Diaz  Procedure(s) Performed: Procedure(s): CARPAL TUNNEL RELEASE (Right) REMOVAL GANGLION OF WRIST (Right)  Patient Location: PACU  Anesthesia Type:General  Level of Consciousness: awake and sedated  Airway & Oxygen Therapy: Patient Spontanous Breathing and Patient connected to face mask oxygen  Post-op Assessment: Report given to RN and Post -op Vital signs reviewed and stable  Post vital signs: Reviewed and stable  Last Vitals:  Vitals:   03/15/17 0940  BP: (!) 155/101  Pulse: 74  Resp: 16  Temp: 36.4 C    Last Pain:  Vitals:   03/15/17 0940  TempSrc: Oral         Complications: No apparent anesthesia complications

## 2017-03-15 NOTE — Anesthesia Post-op Follow-up Note (Cosign Needed)
Anesthesia QCDR form completed.        

## 2017-03-15 NOTE — Anesthesia Procedure Notes (Signed)
Procedure Name: LMA Insertion Date/Time: 03/15/2017 11:14 AM Performed by: Nelda Marseille Pre-anesthesia Checklist: Patient identified, Patient being monitored, Timeout performed, Emergency Drugs available and Suction available Patient Re-evaluated:Patient Re-evaluated prior to inductionOxygen Delivery Method: Circle system utilized Preoxygenation: Pre-oxygenation with 100% oxygen Intubation Type: IV induction Ventilation: Mask ventilation without difficulty LMA: LMA inserted LMA Size: 4.5 Tube type: Oral Number of attempts: 1 Placement Confirmation: positive ETCO2 and breath sounds checked- equal and bilateral Tube secured with: Tape Dental Injury: Teeth and Oropharynx as per pre-operative assessment

## 2017-03-15 NOTE — Anesthesia Postprocedure Evaluation (Signed)
Anesthesia Post Note  Patient: Grant Diaz  Procedure(s) Performed: Procedure(s) (LRB): CARPAL TUNNEL RELEASE (Right) REMOVAL GANGLION OF WRIST (Right)  Patient location during evaluation: PACU Anesthesia Type: General Level of consciousness: awake and alert Pain management: pain level controlled Vital Signs Assessment: post-procedure vital signs reviewed and stable Respiratory status: spontaneous breathing and respiratory function stable Cardiovascular status: stable Anesthetic complications: no     Last Vitals:  Vitals:   03/15/17 1158 03/15/17 1200  BP: 127/65 127/65  Pulse: 68 (!) 52  Resp: 11 12  Temp: 36.3 C     Last Pain:  Vitals:   03/15/17 0940  TempSrc: Oral                 KEPHART,WILLIAM K

## 2017-03-15 NOTE — Op Note (Signed)
03/15/2017  12:02 PM  PATIENT:  Grant Diaz  66 y.o. male  PRE-OPERATIVE DIAGNOSIS:  right carpal tunnel syndrome , dorsal wrist ganglion  POST-OPERATIVE DIAGNOSIS:  right carpal tunnel syndrome , dorsal wrist ganglion  PROCEDURE:  Procedure(s): CARPAL TUNNEL RELEASE (Right) REMOVAL GANGLION OF WRIST (Right)  SURGEON: Laurene Footman, MD  ASSISTANTS: None  ANESTHESIA:   general  EBL:  Total I/O In: 600 [I.V.:600] Out: 1 [Blood:1]  BLOOD ADMINISTERED:none  DRAINS: none   LOCAL MEDICATIONS USED:  MARCAINE     SPECIMEN:  Source of Specimen:  Ganglion cyst  DISPOSITION OF SPECIMEN:  PATHOLOGY  COUNTS:  YES  TOURNIQUET:   31 minutes at 250 mms mercury  IMPLANTS: None  DICTATION: .Dragon Dictation patient brought the operating room and after general anesthesia was obtained the right right arm was prepped and draped in sterile fashion. After patient identification timeout procedures were completed the carpal gentleman tunnel was addressed first with the incision in line with the ring metacarpal. Going down to the transcarpal ligament the ligament was opened and a vascular hemostat placed deep to protect the underlying structures. Releases carried out distally and then proximally and compression was fairly proximal to the wrist flexion crease after release of this there is good vascular blush to the nerve. There is mild flexor tenosynovitis. The wound was irrigated and 10 cc of half percent Sensorcaine with epinephrine was infiltrated in the incision and the wound then closed simple interrupted 5-0 nylon skin sutures. The head was then turned over and the dorsal ganglion was approached through a transverse incision with the subcutaneous tissue spread and again insist THROUGH the wound it was then opened and tract down to the radial carpal joint and she radial scaphoid joint and is approximately 4 mm hole in the dorsal capsule this was cauterized to try prevent recurrence and the  capsular wall was excised with a portion being sent as specimen the wound was thoroughly irrigated and then 10 cc half percent Sensorcaine with epinephrine infiltrated around the incision wound was then closed with simple interrupted 5-0 nylon and a compressive dressing of Xeroform 4 x 4 web roll and Ace wrap applied  PLAN OF CARE: Discharge to home after PACU  PATIENT DISPOSITION:  PACU - hemodynamically stable.

## 2017-03-15 NOTE — Discharge Instructions (Addendum)
Loosen Ace wrap if fingers swell. Work fingers is much as possible. Pain medicine as directed ° °AMBULATORY SURGERY  °DISCHARGE INSTRUCTIONS ° ° °1) The drugs that you were given will stay in your system until tomorrow so for the next 24 hours you should not: ° °A) Drive an automobile °B) Make any legal decisions °C) Drink any alcoholic beverage ° ° °2) You may resume regular meals tomorrow.  Today it is better to start with liquids and gradually work up to solid foods. ° °You may eat anything you prefer, but it is better to start with liquids, then soup and crackers, and gradually work up to solid foods. ° ° °3) Please notify your doctor immediately if you have any unusual bleeding, trouble breathing, redness and pain at the surgery site, drainage, fever, or pain not relieved by medication. ° ° ° °4) Additional Instructions: ° ° ° ° ° ° ° °Please contact your physician with any problems or Same Day Surgery at [phone], Monday through Friday 6 am to 4 pm, or  at [facility] Main number at [phone]. °

## 2017-03-16 LAB — SURGICAL PATHOLOGY

## 2017-03-22 ENCOUNTER — Other Ambulatory Visit: Payer: Self-pay | Admitting: Orthopedic Surgery

## 2017-03-22 DIAGNOSIS — M1711 Unilateral primary osteoarthritis, right knee: Secondary | ICD-10-CM

## 2017-04-10 ENCOUNTER — Ambulatory Visit: Payer: BC Managed Care – PPO

## 2017-04-13 ENCOUNTER — Ambulatory Visit
Admission: RE | Admit: 2017-04-13 | Discharge: 2017-04-13 | Disposition: A | Discharge status: Home / Self Care | Payer: BC Managed Care – PPO | Source: Ambulatory Visit | Attending: Orthopedic Surgery | Admitting: Orthopedic Surgery

## 2017-04-13 DIAGNOSIS — M1711 Unilateral primary osteoarthritis, right knee: Secondary | ICD-10-CM | POA: Diagnosis not present

## 2017-05-09 ENCOUNTER — Encounter
Admission: RE | Admit: 2017-05-09 | Discharge: 2017-05-09 | Disposition: A | Discharge status: Home / Self Care | Payer: BC Managed Care – PPO | Source: Ambulatory Visit | Attending: Orthopedic Surgery | Admitting: Orthopedic Surgery

## 2017-05-09 DIAGNOSIS — M1711 Unilateral primary osteoarthritis, right knee: Secondary | ICD-10-CM | POA: Insufficient documentation

## 2017-05-09 DIAGNOSIS — Z01818 Encounter for other preprocedural examination: Secondary | ICD-10-CM | POA: Insufficient documentation

## 2017-05-09 LAB — BASIC METABOLIC PANEL
Anion gap: 7 (ref 5–15)
BUN: 24 mg/dL — ABNORMAL HIGH (ref 6–20)
CHLORIDE: 105 mmol/L (ref 101–111)
CO2: 26 mmol/L (ref 22–32)
CREATININE: 0.89 mg/dL (ref 0.61–1.24)
Calcium: 9.1 mg/dL (ref 8.9–10.3)
GFR calc non Af Amer: >60 mL/min (ref >60)
Glucose, Bld: 84 mg/dL (ref 65–99)
Potassium: 3.5 mmol/L (ref 3.5–5.1)
Sodium: 138 mmol/L (ref 135–145)

## 2017-05-09 LAB — PROTIME-INR
INR: 1.08
Prothrombin Time: 14 seconds (ref 11.4–15.2)

## 2017-05-09 LAB — URINALYSIS, COMPLETE (UACMP) WITH MICROSCOPIC
Bacteria, UA: NONE SEEN
Bilirubin Urine: NEGATIVE
Glucose, UA: NEGATIVE mg/dL
Hgb urine dipstick: NEGATIVE
Ketones, ur: NEGATIVE mg/dL
Leukocytes, UA: NEGATIVE
Nitrite: NEGATIVE
Protein, ur: NEGATIVE mg/dL
Specific Gravity, Urine: 1.016 (ref 1.005–1.030)
pH: 6 (ref 5.0–8.0)

## 2017-05-09 LAB — CBC
HEMATOCRIT: 38.9 % — AB (ref 40.0–52.0)
Hemoglobin: 13.3 g/dL (ref 13.0–18.0)
MCH: 30.7 pg (ref 26.0–34.0)
MCHC: 34.2 g/dL (ref 32.0–36.0)
MCV: 89.7 fL (ref 80.0–100.0)
Platelets: 210 10*3/uL (ref 150–440)
RBC: 4.34 MIL/uL — ABNORMAL LOW (ref 4.40–5.90)
RDW: 13.8 % (ref 11.5–14.5)
WBC: 6.2 10*3/uL (ref 3.8–10.6)

## 2017-05-09 LAB — TYPE AND SCREEN
ABO/RH(D): A NEG
Antibody Screen: NEGATIVE

## 2017-05-09 LAB — SEDIMENTATION RATE: Sed Rate: 13 mm/hr (ref 0–20)

## 2017-05-09 LAB — SURGICAL PCR SCREEN
MRSA, PCR: NEGATIVE
Staphylococcus aureus: NEGATIVE

## 2017-05-09 LAB — APTT: APTT: 30 s (ref 24–36)

## 2017-05-09 NOTE — Pre-Procedure Instructions (Signed)
History of  PE, office notified.  TXA cancelled.

## 2017-05-09 NOTE — Patient Instructions (Signed)
Your procedure is scheduled on: 05/15/17 Tues Report to Same Day Surgery 2nd floor medical mall [facility] Entrance-take elevator on left to 2nd floor.  Check in with surgery information desk.) To find out your arrival time please call [phone] between 1PM - 3PM on 05/14/17 Mon  Remember: Instructions that are not followed completely may result in serious medical risk, up to and including death, or upon the discretion of your surgeon and anesthesiologist your surgery may need to be rescheduled.    _x___ 1. Do not eat food or drink liquids after midnight. No gum chewing or                              hard candies.     __x__ 2. No Alcohol for 24 hours before or after surgery.   __x__3. No Smoking for 24 prior to surgery.   ____  4. Bring all medications with you on the day of surgery if instructed.    __x__ 5. Notify your doctor if there is any change in your medical condition     (cold, fever, infections).     Do not wear jewelry, make-up, hairpins, clips or nail polish.  Do not wear lotions, powders, or perfumes. You may wear deodorant.  Do not shave 48 hours prior to surgery. Men may shave face and neck.  Do not bring valuables to the hospital.    [facility] is not responsible for any belongings or valuables.               Contacts, dentures or bridgework may not be worn into surgery.  Leave your suitcase in the car. After surgery it may be brought to your room.  For patients admitted to the hospital, discharge time is determined by your                       treatment team.   Patients discharged the day of surgery will not be allowed to drive home.  You will need someone to drive you home and stay with you the night of your procedure.    Please read over the following fact sheets that you were given:   [facility] Preparing for Surgery and or MRSA Information   _x___ Take anti-hypertensive (unless it includes a diuretic), cardiac, seizure, asthma,     anti-reflux and  psychiatric medicines. These include:  1. amLODipine (NORVASC  2.omeprazole (PRILOSEC  3.traMADol (ULTRAM  4.  5.  6.  ____Fleets enema or Magnesium Citrate as directed.   _x___ Use CHG Soap or sage wipes as directed on instruction sheet   ____ Use inhalers on the day of surgery and bring to hospital day of surgery  ____ Stop Metformin and Janumet 2 days prior to surgery.    ____ Take 1/2 of usual insulin dose the night before surgery and none on the morning     surgery.   _x___ Follow recommendations from Cardiologist, Pulmonologist or PCP regarding          stopping Aspirin, Coumadin, Pllavix ,Eliquis, Effient, or Pradaxa, and Pletal.  Check with physician regarding stopping Aspirin  X____Stop Anti-inflammatories such as Advil, Aleve, Ibuprofen, Motrin, Naproxen, Naprosyn, Goodies powders or aspirin products. OK to take Tylenol and                          Celebrex.   _x___ Stop supplements until  after surgery.  But may continue Vitamin D, Vitamin B,       and multivitamin.   ____ Bring C-Pap to the hospital.

## 2017-05-10 LAB — URINE CULTURE

## 2017-05-14 MED ORDER — CEFAZOLIN SODIUM-DEXTROSE 2-4 GM/100ML-% IV SOLN
2.0000 g | Freq: Once | INTRAVENOUS | Status: AC
  Administered 2017-05-15: 2 g via INTRAVENOUS

## 2017-05-14 NOTE — Pre-Procedure Instructions (Signed)
Progress Notes - in this encounter  Table of Contents for Progress Notes Feliberto Gottron, [place] - 05/04/2017 9:45 AM EDT Eden Lathe, CMA - 05/04/2017 9:45 AM EDT   Feliberto Gottron, PA - 05/04/2017 9:45 AM EDT Formatting of this note may be different from the original. Chief Complaint: Chief Complaint  Patient presents with  . Pre-op Exam  Right TKA, 05-15-17, Grant Diaz is a 66 y.o. male who presents today for history and physical for right total knee arthroplasty with Dr. Rudene Christians on 05/15/2017. Patient has severe pain in the right knee. His pain has been getting worse in the last 6 months. He has done well with a left total knee arthroplasty. Is no longer getting relief with cortisone and Visco supplementation. His pain interferes with his activities of daily living. Patient currently taking Tylenol, Celebrex and tramadol. He has intermittent swelling, buckling and giving away sensation throughout the right knee. Pain is located on the medial joint line. Pain is 8 out of 10.  Past Medical History: Past Medical History:  Diagnosis Date  . Hiatal hernia  . Hypertension  . Lumbago  . Osteoarthritis  . Prostate cancer (CMS-HCC)  diagnosed 2005, status post radiation therapy with seed implantation in 2006 [facility]).  . Pulmonary embolism (CMS-HCC)  . Sleep apnea  . Umbilical hernia   Past Surgical History: Past Surgical History:  Procedure Laterality Date  . ARTHROPLASTY TOTAL KNEE Left 10/12/2015  Dr.menz  . Arthroscopic knee surgery 2007. ACL repair  . carpal tunnel release right Right 03/15/2017  Dr.Menz  . CHOLECYSTECTOMY 2003  . HERNIA REPAIR 03/2008  hiatal hernia  . HERNIA REPAIR 07/2724  Umbilical  . Lap band surgery 6/09 with partial hiatal hernia repairLap band surgery 6/09 with partial hiatal hernia repair  . Radiation therapy with seed implantation in 2006 for prostate cancer  . removal ganglion of wrist Right 03/15/2017  Dr.Menz   Past  Family History: Family History  Problem Relation Age of Onset  . Diabetes Mother  . High blood pressure (Hypertension) Mother  . Stroke Mother  . High blood pressure (Hypertension) Father  . Parkinsonism Father  . Skin cancer Brother   Medications: Current Outpatient Prescriptions Ordered in Epic  Medication Sig Dispense Refill  . acetaminophen (TYLENOL) 500 MG tablet Take 500 mg by mouth every 6 (six) hours as needed for Pain.  Marland Kitchen amLODIPine (NORVASC) 10 MG tablet Take 10 mg by mouth once daily.   Marland Kitchen aspirin 81 MG EC tablet Take 81 mg by mouth once daily.  . celecoxib (CELEBREX) 200 MG capsule TAKE ONE CAPSULE BY MOUTH ONCE DAILY 30 capsule 5  . cholecalciferol (VITAMIN D3) 1,000 unit capsule Take 1,000 Units by mouth once daily.   Marland Kitchen lisinopril-hydrochlorothiazide (PRINZIDE,ZESTORETIC) 20-12.5 mg tablet Take 1 tablet by mouth once daily.   . multivitamin tablet Take 1 tablet by mouth once daily.  Marland Kitchen omeprazole (PRILOSEC) 20 MG DR capsule Take 1 capsule by mouth every other day.  . traMADol (ULTRAM) 50 mg tablet TAKE 1 TABLET BY MOUTH EVERY 8 HOURS AS NEEDED FOR PAIN 50 tablet 1  . triamcinolone 0.1 % cream Apply topically continuously as needed.   . turmeric root extract 500 mg Cap Take 1 capsule by mouth once daily.  Marland Kitchen HYDROcodone-acetaminophen (NORCO) 5-325 mg tablet Take 1 tablet by mouth every 6 (six) hours as needed.   No current Epic-ordered facility-administered medications on file.   Allergies: Allergies  Allergen Reactions  . Adhesive  Rash  . Latex, Natural Rubber Rash    Review of Systems:  A comprehensive 14 point ROS was performed, reviewed by me today, and the pertinent orthopaedic findings are documented in the HPI.  Exam: BP 124/70  Pulse 73  Ht 182.9 cm (6')  Wt (!) 117.8 kg (259 lb 9.6 oz)  SpO2 98%  BMI 35.21 kg/m   General:  Well developed, well nourished, no apparent distress, normal affect, mild antalgic gait.  HEENT: Head normocephalic,  atraumatic, PERRL.   Abdomen: Soft, non tender, non distended, Bowel sounds present.  Heart: Examination of the heart reveals regular, rate, and rhythm. There is no murmur noted on ascultation. There is a normal apical pulse.  Lungs: Lungs are clear to auscultation. There is no wheeze, rhonchi, or crackles. There is normal expansion of bilateral chest walls.   Right knee:  Examination of the right knee shows patient has mild effusion. Mild tenderness along the medial lateral joint line. Is stable to valgus and varus stress testing. He has 0-95 degrees range of motion. Mild patellofemoral crepitus. Edema in the lower extremity. No warmth erythema or drainage  Imaging: X-rays of the right knee reviewed by me from 01/31/2016 showed significant joint space narrowing subchondral changes in the medial lateral compartment. Moderate to severe patellofemoral osteoarthritis. No evidence of acute bony normality or effusion.  Impression: Primary osteoarthritis of right knee [M17.11] Primary osteoarthritis of right knee (primary encounter diagnosis)  Plan:  1. Risks, benefits, complications of a right total knee arthroplasty were discussed with the patient. Patient has agreed and consented to the procedure with Dr. Rudene Christians on 05/15/2017.  This note was generated in part with voice recognition software and I apologize for any typographical errors that were not detected and corrected.  Feliberto Gottron MPA-C

## 2017-05-15 ENCOUNTER — Inpatient Hospital Stay: Payer: BC Managed Care – PPO | Admitting: Anesthesiology

## 2017-05-15 ENCOUNTER — Inpatient Hospital Stay
Admission: RE | Admit: 2017-05-15 | Discharge: 2017-05-17 | DRG: 470 | Disposition: A | Discharge status: Home Health | Payer: BC Managed Care – PPO | Source: Ambulatory Visit | Attending: Orthopedic Surgery | Admitting: Orthopedic Surgery

## 2017-05-15 ENCOUNTER — Inpatient Hospital Stay: Payer: BC Managed Care – PPO

## 2017-05-15 ENCOUNTER — Encounter
Admission: RE | Disposition: A | Discharge status: Home Health | Payer: Self-pay | Source: Ambulatory Visit | Attending: Orthopedic Surgery

## 2017-05-15 DIAGNOSIS — M1711 Unilateral primary osteoarthritis, right knee: Principal | ICD-10-CM | POA: Diagnosis present

## 2017-05-15 DIAGNOSIS — K219 Gastro-esophageal reflux disease without esophagitis: Secondary | ICD-10-CM | POA: Diagnosis present

## 2017-05-15 DIAGNOSIS — Z91048 Other nonmedicinal substance allergy status: Secondary | ICD-10-CM

## 2017-05-15 DIAGNOSIS — Z7982 Long term (current) use of aspirin: Secondary | ICD-10-CM

## 2017-05-15 DIAGNOSIS — Z6837 Body mass index (BMI) 37.0-37.9, adult: Secondary | ICD-10-CM | POA: Diagnosis not present

## 2017-05-15 DIAGNOSIS — I10 Essential (primary) hypertension: Secondary | ICD-10-CM | POA: Diagnosis present

## 2017-05-15 DIAGNOSIS — Z96652 Presence of left artificial knee joint: Secondary | ICD-10-CM | POA: Diagnosis present

## 2017-05-15 DIAGNOSIS — M25561 Pain in right knee: Secondary | ICD-10-CM | POA: Diagnosis present

## 2017-05-15 DIAGNOSIS — G4733 Obstructive sleep apnea (adult) (pediatric): Secondary | ICD-10-CM | POA: Diagnosis present

## 2017-05-15 DIAGNOSIS — G8918 Other acute postprocedural pain: Secondary | ICD-10-CM

## 2017-05-15 DIAGNOSIS — Z91040 Latex allergy status: Secondary | ICD-10-CM | POA: Diagnosis not present

## 2017-05-15 HISTORY — PX: TOTAL KNEE ARTHROPLASTY: SHX125

## 2017-05-15 LAB — CREATININE, SERUM: CREATININE: 0.9 mg/dL (ref 0.61–1.24)

## 2017-05-15 LAB — CBC
HEMATOCRIT: 37.9 % — AB (ref 40.0–52.0)
HEMOGLOBIN: 12.8 g/dL — AB (ref 13.0–18.0)
MCH: 30.7 pg (ref 26.0–34.0)
MCHC: 33.8 g/dL (ref 32.0–36.0)
MCV: 90.7 fL (ref 80.0–100.0)
Platelets: 203 10*3/uL (ref 150–440)
RBC: 4.18 MIL/uL — ABNORMAL LOW (ref 4.40–5.90)
RDW: 13.8 % (ref 11.5–14.5)
WBC: 17.7 10*3/uL — AB (ref 3.8–10.6)

## 2017-05-15 SURGERY — ARTHROPLASTY, KNEE, TOTAL
Anesthesia: General | Site: Knee | Laterality: Right | Wound class: Clean

## 2017-05-15 MED ORDER — ADULT MULTIVITAMIN W/MINERALS CH
1.0000 | ORAL_TABLET | Freq: Every day | ORAL | Status: DC
  Administered 2017-05-16 – 2017-05-17 (×2): 1 via ORAL
  Filled 2017-05-15 (×2): qty 1

## 2017-05-15 MED ORDER — MORPHINE SULFATE (PF) 10 MG/ML IV SOLN
INTRAVENOUS | Status: AC
  Filled 2017-05-15: qty 1

## 2017-05-15 MED ORDER — SUGAMMADEX SODIUM 200 MG/2ML IV SOLN
INTRAVENOUS | Status: DC | PRN
  Administered 2017-05-15: 235 mg via INTRAVENOUS

## 2017-05-15 MED ORDER — NEOMYCIN-POLYMYXIN B GU 40-200000 IR SOLN
Status: AC
  Filled 2017-05-15: qty 20

## 2017-05-15 MED ORDER — PHENYLEPHRINE HCL 10 MG/ML IJ SOLN
INTRAMUSCULAR | Status: DC | PRN
  Administered 2017-05-15 (×2): 100 ug via INTRAVENOUS

## 2017-05-15 MED ORDER — DOCUSATE SODIUM 100 MG PO CAPS
100.0000 mg | ORAL_CAPSULE | Freq: Two times a day (BID) | ORAL | Status: DC
  Administered 2017-05-15 – 2017-05-17 (×5): 100 mg via ORAL
  Filled 2017-05-15 (×5): qty 1

## 2017-05-15 MED ORDER — METHOCARBAMOL 500 MG PO TABS
500.0000 mg | ORAL_TABLET | Freq: Four times a day (QID) | ORAL | Status: DC | PRN
  Administered 2017-05-15 – 2017-05-17 (×4): 500 mg via ORAL
  Filled 2017-05-15 (×4): qty 1

## 2017-05-15 MED ORDER — LACTATED RINGERS IV SOLN
INTRAVENOUS | Status: DC
  Administered 2017-05-15: 07:00:00 via INTRAVENOUS
  Administered 2017-05-15: 1000 mL via INTRAVENOUS

## 2017-05-15 MED ORDER — ONDANSETRON HCL 4 MG/2ML IJ SOLN: 4.0000 mg | Freq: Four times a day (QID) | INTRAMUSCULAR | Status: DC | PRN

## 2017-05-15 MED ORDER — DIPHENHYDRAMINE HCL 12.5 MG/5ML PO ELIX: 12.5000 mg | ORAL_SOLUTION | ORAL | Status: DC | PRN

## 2017-05-15 MED ORDER — FENTANYL CITRATE (PF) 100 MCG/2ML IJ SOLN
INTRAMUSCULAR | Status: AC
  Administered 2017-05-15: 25 ug via INTRAVENOUS
  Filled 2017-05-15: qty 2

## 2017-05-15 MED ORDER — TRANEXAMIC ACID 1000 MG/10ML IV SOLN
INTRAVENOUS | Status: AC
  Filled 2017-05-15: qty 10

## 2017-05-15 MED ORDER — ONDANSETRON HCL 4 MG PO TABS
4.0000 mg | ORAL_TABLET | Freq: Four times a day (QID) | ORAL | Status: DC | PRN
  Administered 2017-05-15: 4 mg via ORAL
  Filled 2017-05-15: qty 1

## 2017-05-15 MED ORDER — BUPIVACAINE-EPINEPHRINE (PF) 0.25% -1:200000 IJ SOLN
INTRAMUSCULAR | Status: DC | PRN
  Administered 2017-05-15: 30 mL

## 2017-05-15 MED ORDER — MAGNESIUM HYDROXIDE 400 MG/5ML PO SUSP
30.0000 mL | Freq: Every day | ORAL | Status: DC | PRN
  Administered 2017-05-16 (×2): 30 mL via ORAL
  Filled 2017-05-15 (×2): qty 30

## 2017-05-15 MED ORDER — METOCLOPRAMIDE HCL 5 MG/ML IJ SOLN: 5.0000 mg | Freq: Three times a day (TID) | INTRAMUSCULAR | Status: DC | PRN

## 2017-05-15 MED ORDER — LISINOPRIL 20 MG PO TABS
20.0000 mg | ORAL_TABLET | Freq: Every day | ORAL | Status: DC
  Administered 2017-05-15 – 2017-05-17 (×3): 20 mg via ORAL
  Filled 2017-05-15 (×3): qty 1

## 2017-05-15 MED ORDER — MIDAZOLAM HCL 2 MG/2ML IJ SOLN
INTRAMUSCULAR | Status: AC
  Filled 2017-05-15: qty 2

## 2017-05-15 MED ORDER — FENTANYL CITRATE (PF) 100 MCG/2ML IJ SOLN
INTRAMUSCULAR | Status: AC
  Filled 2017-05-15: qty 2

## 2017-05-15 MED ORDER — SODIUM CHLORIDE 0.9 % IV SOLN
INTRAVENOUS | Status: DC | PRN
  Administered 2017-05-15: 1000 mg via INTRAVENOUS

## 2017-05-15 MED ORDER — ROCURONIUM BROMIDE 100 MG/10ML IV SOLN
INTRAVENOUS | Status: DC | PRN
  Administered 2017-05-15: 30 mg via INTRAVENOUS
  Administered 2017-05-15: 40 mg via INTRAVENOUS
  Administered 2017-05-15 (×2): 10 mg via INTRAVENOUS

## 2017-05-15 MED ORDER — PROPOFOL 10 MG/ML IV BOLUS
INTRAVENOUS | Status: DC | PRN
  Administered 2017-05-15: 200 mg via INTRAVENOUS

## 2017-05-15 MED ORDER — MORPHINE SULFATE 10 MG/ML IJ SOLN
INTRAMUSCULAR | Status: DC | PRN
  Administered 2017-05-15: 10 mg

## 2017-05-15 MED ORDER — CEFAZOLIN SODIUM-DEXTROSE 2-4 GM/100ML-% IV SOLN
2.0000 g | Freq: Four times a day (QID) | INTRAVENOUS | Status: AC
  Administered 2017-05-15 – 2017-05-16 (×3): 2 g via INTRAVENOUS
  Filled 2017-05-15 (×3): qty 100

## 2017-05-15 MED ORDER — VITAMIN D 1000 UNITS PO TABS
1000.0000 [IU] | ORAL_TABLET | Freq: Every day | ORAL | Status: DC
  Administered 2017-05-15 – 2017-05-17 (×3): 1000 [IU] via ORAL
  Filled 2017-05-15 (×3): qty 1

## 2017-05-15 MED ORDER — METOCLOPRAMIDE HCL 10 MG PO TABS: 5.0000 mg | ORAL_TABLET | Freq: Three times a day (TID) | ORAL | Status: DC | PRN

## 2017-05-15 MED ORDER — PROPOFOL 500 MG/50ML IV EMUL
INTRAVENOUS | Status: AC
  Filled 2017-05-15: qty 50

## 2017-05-15 MED ORDER — LIDOCAINE HCL (CARDIAC) 20 MG/ML IV SOLN
INTRAVENOUS | Status: DC | PRN
Start: 2017-05-15 — End: 2017-05-15
  Administered 2017-05-15: 40 mg via INTRAVENOUS

## 2017-05-15 MED ORDER — ASPIRIN EC 81 MG PO TBEC
81.0000 mg | DELAYED_RELEASE_TABLET | Freq: Every day | ORAL | Status: DC
  Administered 2017-05-16 – 2017-05-17 (×2): 81 mg via ORAL
  Filled 2017-05-15 (×2): qty 1

## 2017-05-15 MED ORDER — PANTOPRAZOLE SODIUM 40 MG PO TBEC
40.0000 mg | DELAYED_RELEASE_TABLET | Freq: Every day | ORAL | Status: DC
  Administered 2017-05-16 – 2017-05-17 (×2): 40 mg via ORAL
  Filled 2017-05-15 (×2): qty 1

## 2017-05-15 MED ORDER — MAGNESIUM CITRATE PO SOLN
1.0000 | Freq: Once | ORAL | Status: DC | PRN
  Filled 2017-05-15: qty 296

## 2017-05-15 MED ORDER — ROCURONIUM BROMIDE 50 MG/5ML IV SOLN
INTRAVENOUS | Status: AC
  Filled 2017-05-15: qty 1

## 2017-05-15 MED ORDER — BUPIVACAINE LIPOSOME 1.3 % IJ SUSP
INTRAMUSCULAR | Status: AC
  Filled 2017-05-15: qty 20

## 2017-05-15 MED ORDER — ONDANSETRON HCL 4 MG/2ML IJ SOLN
INTRAMUSCULAR | Status: DC | PRN
  Administered 2017-05-15: 4 mg via INTRAVENOUS

## 2017-05-15 MED ORDER — AMLODIPINE BESYLATE 10 MG PO TABS
10.0000 mg | ORAL_TABLET | Freq: Every day | ORAL | Status: DC
  Administered 2017-05-16 – 2017-05-17 (×2): 10 mg via ORAL
  Filled 2017-05-15 (×2): qty 1

## 2017-05-15 MED ORDER — MORPHINE SULFATE (PF) 2 MG/ML IV SOLN: 2.0000 mg | INTRAVENOUS | Status: DC | PRN

## 2017-05-15 MED ORDER — NEOMYCIN-POLYMYXIN B GU 40-200000 IR SOLN
Status: DC | PRN
Start: 2017-05-15 — End: 2017-05-15
  Administered 2017-05-15: 14 mL

## 2017-05-15 MED ORDER — MIDAZOLAM HCL 5 MG/5ML IJ SOLN
INTRAMUSCULAR | Status: DC | PRN
  Administered 2017-05-15: 2 mg via INTRAVENOUS

## 2017-05-15 MED ORDER — BISACODYL 10 MG RE SUPP: 10.0000 mg | Freq: Every day | RECTAL | Status: DC | PRN

## 2017-05-15 MED ORDER — ACETAMINOPHEN 325 MG PO TABS: 650.0000 mg | ORAL_TABLET | Freq: Four times a day (QID) | ORAL | Status: DC | PRN

## 2017-05-15 MED ORDER — KETOROLAC TROMETHAMINE 30 MG/ML IJ SOLN
INTRAMUSCULAR | Status: AC
  Filled 2017-05-15: qty 1

## 2017-05-15 MED ORDER — FENTANYL CITRATE (PF) 100 MCG/2ML IJ SOLN
25.0000 ug | INTRAMUSCULAR | Status: DC | PRN
  Administered 2017-05-15 (×4): 25 ug via INTRAVENOUS

## 2017-05-15 MED ORDER — ACETAMINOPHEN 650 MG RE SUPP: 650.0000 mg | Freq: Four times a day (QID) | RECTAL | Status: DC | PRN

## 2017-05-15 MED ORDER — ONDANSETRON HCL 4 MG/2ML IJ SOLN
INTRAMUSCULAR | Status: AC
  Filled 2017-05-15: qty 2

## 2017-05-15 MED ORDER — PHENOL 1.4 % MT LIQD
1.0000 | OROMUCOSAL | Status: DC | PRN
  Filled 2017-05-15: qty 177

## 2017-05-15 MED ORDER — SEVOFLURANE IN SOLN
RESPIRATORY_TRACT | Status: AC
  Filled 2017-05-15: qty 250

## 2017-05-15 MED ORDER — DEXAMETHASONE SODIUM PHOSPHATE 10 MG/ML IJ SOLN
INTRAMUSCULAR | Status: AC
  Filled 2017-05-15: qty 1

## 2017-05-15 MED ORDER — SODIUM CHLORIDE 0.9 % IJ SOLN
INTRAMUSCULAR | Status: AC
  Filled 2017-05-15: qty 100

## 2017-05-15 MED ORDER — HYDROCHLOROTHIAZIDE 12.5 MG PO CAPS
12.5000 mg | ORAL_CAPSULE | Freq: Every day | ORAL | Status: DC
  Administered 2017-05-15 – 2017-05-17 (×3): 12.5 mg via ORAL
  Filled 2017-05-15 (×3): qty 1

## 2017-05-15 MED ORDER — LIDOCAINE HCL (PF) 2 % IJ SOLN
INTRAMUSCULAR | Status: AC
  Filled 2017-05-15: qty 2

## 2017-05-15 MED ORDER — ONDANSETRON HCL 4 MG/2ML IJ SOLN: 4.0000 mg | Freq: Once | INTRAMUSCULAR | Status: DC | PRN

## 2017-05-15 MED ORDER — DEXAMETHASONE SODIUM PHOSPHATE 10 MG/ML IJ SOLN
INTRAMUSCULAR | Status: DC | PRN
  Administered 2017-05-15: 10 mg via INTRAVENOUS

## 2017-05-15 MED ORDER — FENTANYL CITRATE (PF) 100 MCG/2ML IJ SOLN
INTRAMUSCULAR | Status: DC | PRN
  Administered 2017-05-15: 25 ug via INTRAVENOUS
  Administered 2017-05-15 (×3): 50 ug via INTRAVENOUS
  Administered 2017-05-15: 100 ug via INTRAVENOUS

## 2017-05-15 MED ORDER — MENTHOL 3 MG MT LOZG
1.0000 | LOZENGE | OROMUCOSAL | Status: DC | PRN
  Filled 2017-05-15: qty 9

## 2017-05-15 MED ORDER — ENOXAPARIN SODIUM 30 MG/0.3ML ~~LOC~~ SOLN
30.0000 mg | Freq: Two times a day (BID) | SUBCUTANEOUS | Status: DC
  Administered 2017-05-16 – 2017-05-17 (×3): 30 mg via SUBCUTANEOUS
  Filled 2017-05-15 (×3): qty 0.3

## 2017-05-15 MED ORDER — ZOLPIDEM TARTRATE 5 MG PO TABS: 5.0000 mg | ORAL_TABLET | Freq: Every evening | ORAL | Status: DC | PRN

## 2017-05-15 MED ORDER — EPINEPHRINE PF 1 MG/ML IJ SOLN
INTRAMUSCULAR | Status: AC
  Filled 2017-05-15: qty 2

## 2017-05-15 MED ORDER — LISINOPRIL-HYDROCHLOROTHIAZIDE 20-12.5 MG PO TABS: 1.0000 | ORAL_TABLET | ORAL | Status: DC

## 2017-05-15 MED ORDER — SODIUM CHLORIDE 0.9 % IV SOLN
INTRAVENOUS | Status: DC | PRN
  Administered 2017-05-15: 60 mL

## 2017-05-15 MED ORDER — OXYCODONE HCL 5 MG PO TABS
5.0000 mg | ORAL_TABLET | ORAL | Status: DC | PRN
  Administered 2017-05-15 – 2017-05-17 (×12): 10 mg via ORAL
  Administered 2017-05-17: 5 mg via ORAL
  Administered 2017-05-17 (×3): 10 mg via ORAL
  Filled 2017-05-15 (×16): qty 2

## 2017-05-15 MED ORDER — SUGAMMADEX SODIUM 200 MG/2ML IV SOLN
INTRAVENOUS | Status: AC
  Filled 2017-05-15: qty 2

## 2017-05-15 MED ORDER — BUPIVACAINE HCL (PF) 0.25 % IJ SOLN
INTRAMUSCULAR | Status: AC
  Filled 2017-05-15: qty 30

## 2017-05-15 MED ORDER — METHOCARBAMOL 1000 MG/10ML IJ SOLN
500.0000 mg | Freq: Four times a day (QID) | INTRAVENOUS | Status: DC | PRN
  Filled 2017-05-15: qty 5

## 2017-05-15 MED ORDER — ROCURONIUM BROMIDE 100 MG/10ML IV SOLN: INTRAVENOUS | Status: DC | PRN

## 2017-05-15 MED ORDER — SODIUM CHLORIDE 0.9 % IV SOLN
INTRAVENOUS | Status: DC
  Administered 2017-05-15 – 2017-05-16 (×2): via INTRAVENOUS

## 2017-05-15 MED ORDER — TRIAMCINOLONE ACETONIDE 0.025 % EX CREA
1.0000 "application " | TOPICAL_CREAM | Freq: Two times a day (BID) | CUTANEOUS | Status: DC
  Administered 2017-05-16: 1 via TOPICAL
  Filled 2017-05-15: qty 15

## 2017-05-15 MED ORDER — CEFAZOLIN SODIUM-DEXTROSE 2-4 GM/100ML-% IV SOLN
INTRAVENOUS | Status: AC
Start: 2017-05-15 — End: 2017-05-15
  Filled 2017-05-15: qty 100

## 2017-05-15 SURGICAL SUPPLY — 63 items
BANDAGE ACE 6X5 VEL STRL LF (GAUZE/BANDAGES/DRESSINGS) ×3 IMPLANT
BLADE SAW 1 (BLADE) ×3 IMPLANT
BLOCK CUTTING TIBIAL 4 RT MIS (MISCELLANEOUS) IMPLANT
BLOCK CUTTING TIBIAL 5 RT (MISCELLANEOUS) IMPLANT
CANISTER SUCT 1200ML W/VALVE (MISCELLANEOUS) ×3 IMPLANT
CANISTER SUCT 3000ML PPV (MISCELLANEOUS) ×6 IMPLANT
CAPT KNEE TOTAL 3 ×2 IMPLANT
CATH FOL LEG HOLDER (MISCELLANEOUS) ×3 IMPLANT
CATH TRAY METER 16FR LF (MISCELLANEOUS) ×3 IMPLANT
CEMENT HV SMART SET (Cement) ×6 IMPLANT
CHLORAPREP W/TINT 26ML (MISCELLANEOUS) ×6 IMPLANT
COOLER POLAR GLACIER W/PUMP (MISCELLANEOUS) ×3 IMPLANT
CUFF TOURN 24 STER (MISCELLANEOUS) IMPLANT
CUFF TOURN 30 STER DUAL PORT (MISCELLANEOUS) ×2 IMPLANT
DRAPE SHEET LG 3/4 BI-LAMINATE (DRAPES) ×6 IMPLANT
ELECT CAUTERY BLADE 6.4 (BLADE) ×3 IMPLANT
ELECT REM PT RETURN 9FT ADLT (ELECTROSURGICAL) ×3
ELECTRODE REM PT RTRN 9FT ADLT (ELECTROSURGICAL) ×1 IMPLANT
GAUZE PETRO XEROFOAM 1X8 (MISCELLANEOUS) ×3 IMPLANT
GAUZE SPONGE 4X4 12PLY STRL (GAUZE/BANDAGES/DRESSINGS) ×3 IMPLANT
GLOVE BIOGEL PI IND STRL 9 (GLOVE) ×1 IMPLANT
GLOVE BIOGEL PI INDICATOR 9 (GLOVE) ×2
GLOVE INDICATOR 8.0 STRL GRN (GLOVE) ×3 IMPLANT
GLOVE SURG ORTHO 8.0 STRL STRW (GLOVE) ×3 IMPLANT
GLOVE SURG SYN 9.0  PF PI (GLOVE) ×2
GLOVE SURG SYN 9.0 PF PI (GLOVE) ×1 IMPLANT
GOWN SRG 2XL LVL 4 RGLN SLV (GOWNS) ×1 IMPLANT
GOWN STRL NON-REIN 2XL LVL4 (GOWNS) ×3
GOWN STRL REUS W/ TWL LRG LVL3 (GOWN DISPOSABLE) ×1 IMPLANT
GOWN STRL REUS W/ TWL XL LVL3 (GOWN DISPOSABLE) ×1 IMPLANT
GOWN STRL REUS W/TWL LRG LVL3 (GOWN DISPOSABLE) ×3
GOWN STRL REUS W/TWL XL LVL3 (GOWN DISPOSABLE) ×3
HOOD PEEL AWAY FLYTE STAYCOOL (MISCELLANEOUS) ×6 IMPLANT
IMMBOLIZER KNEE 19 BLUE UNIV (SOFTGOODS) ×3 IMPLANT
KIT RM TURNOVER STRD PROC AR (KITS) ×3 IMPLANT
KNEE MEDACTA TIBIAL/FEMORAL BL (Knees) ×2 IMPLANT
KNIFE SCULPS 14X20 (INSTRUMENTS) ×3 IMPLANT
MEDACTA FEMUR CUTTING BLOCK ×2 IMPLANT
NDL SAFETY 18GX1.5 (NEEDLE) ×3 IMPLANT
NDL SPNL 18GX3.5 QUINCKE PK (NEEDLE) ×1 IMPLANT
NDL SPNL 20GX3.5 QUINCKE YW (NEEDLE) ×1 IMPLANT
NEEDLE SPNL 18GX3.5 QUINCKE PK (NEEDLE) ×3 IMPLANT
NEEDLE SPNL 20GX3.5 QUINCKE YW (NEEDLE) ×3 IMPLANT
NS IRRIG 1000ML POUR BTL (IV SOLUTION) ×3 IMPLANT
PACK TOTAL KNEE (MISCELLANEOUS) ×3 IMPLANT
PAD WRAPON POLAR KNEE (MISCELLANEOUS) ×1 IMPLANT
PULSAVAC PLUS IRRIG FAN TIP (DISPOSABLE) ×3
SOL .9 NS 3000ML IRR  AL (IV SOLUTION) ×2
SOL .9 NS 3000ML IRR AL (IV SOLUTION) ×1
SOL .9 NS 3000ML IRR UROMATIC (IV SOLUTION) ×1 IMPLANT
STAPLER SKIN PROX 35W (STAPLE) ×3 IMPLANT
SUCTION FRAZIER HANDLE 10FR (MISCELLANEOUS) ×2
SUCTION TUBE FRAZIER 10FR DISP (MISCELLANEOUS) ×1 IMPLANT
SUT DVC 2 QUILL PDO  T11 36X36 (SUTURE) ×2
SUT DVC 2 QUILL PDO T11 36X36 (SUTURE) ×1 IMPLANT
SUT ETHIBOND NAB CT1 #1 30IN (SUTURE) ×2 IMPLANT
SUT V-LOC 90 ABS DVC 3-0 CL (SUTURE) ×3 IMPLANT
SYR 20CC LL (SYRINGE) ×3 IMPLANT
SYR 50ML LL SCALE MARK (SYRINGE) ×6 IMPLANT
TIP FAN IRRIG PULSAVAC PLUS (DISPOSABLE) ×1 IMPLANT
TOWEL OR 17X26 4PK STRL BLUE (TOWEL DISPOSABLE) ×3 IMPLANT
TOWER CARTRIDGE SMART MIX (DISPOSABLE) ×3 IMPLANT
WRAPON POLAR PAD KNEE (MISCELLANEOUS) ×3

## 2017-05-15 NOTE — Anesthesia Preprocedure Evaluation (Signed)
Anesthesia Evaluation  Patient identified by MRN, date of birth, ID band Patient awake    Reviewed: Allergy & Precautions, H&P , NPO status , Patient's Chart, lab work & pertinent test results, reviewed documented beta blocker date and time   History of Anesthesia Complications (+) PROLONGED EMERGENCE  Airway Mallampati: III  TM Distance: >3 FB Neck ROM: full    Dental  (+) Teeth Intact   Pulmonary neg pulmonary ROS, sleep apnea and Continuous Positive Airway Pressure Ventilation ,    Pulmonary exam normal        Cardiovascular hypertension, negative cardio ROS Normal cardiovascular exam Rhythm:regular Rate:Normal     Neuro/Psych negative neurological ROS  negative psych ROS   GI/Hepatic negative GI ROS, Neg liver ROS, Medicated,  Endo/Other  negative endocrine ROS  Renal/GU Renal diseasenegative Renal ROS  negative genitourinary   Musculoskeletal   Abdominal   Peds  Hematology negative hematology ROS (+)   Anesthesia Other Findings Past Medical History: No date: Arthritis No date: Cancer ([place])     Comment:  Prostate-radiation No date: Chronic kidney disease     Comment:  kidney stones  remote history No date: GERD (gastroesophageal reflux disease) No date: Hypertension No date: Joint pain 2013: Pancreatitis     Comment:  [facility] March 2016: Pulmonary embolus ([place])     Comment:  Bilateral pulmonary embolus, [facility] No date: Sleep apnea, obstructive     Comment:  cpap Past Surgical History: 03/15/2017: CARPAL TUNNEL RELEASE; Right     Comment:  Procedure: CARPAL TUNNEL RELEASE;  Surgeon: Hessie Knows, MD;  Location: [facility] ORS;  Service: Orthopedics;               Laterality: Right; No date: CHOLECYSTECTOMY 05/28/2013: ESOPHAGOGASTRODUODENOSCOPY; N/A     Comment:  Procedure: ESOPHAGOGASTRODUODENOSCOPY (EGD);  Surgeon:               Shann Medal, MD;  Location: [facility];  Service:     General;  Laterality: N/A; 2003: GALLBLADDER SURGERY 03/15/2017: GANGLION CYST EXCISION; Right     Comment:  Procedure: REMOVAL GANGLION OF WRIST;  Surgeon: Hessie Knows, MD;  Location: [facility] ORS;  Service: Orthopedics;               Laterality: Right; No date: HERNIA REPAIR 2011: hose [id number]: KNEE ARTHROSCOPY; Bilateral 2009: LAPAROSCOPIC GASTRIC BANDING     Comment:  and hernia repair 2005: RADIATION IMPLANT W/ ULTRASOUND, MALE     Comment:  internal/external radiation for prostate cancer 10/12/2015: TOTAL KNEE ARTHROPLASTY; Left     Comment:  Procedure: TOTAL KNEE ARTHROPLASTY;  Surgeon: Hessie Knows, MD;  Location: [facility] ORS;  Service: Orthopedics;                Laterality: Left; 2423: UMBILICAL HERNIA REPAIR BMI    Body Mass Index:  37.31 kg/m     Reproductive/Obstetrics negative OB ROS                             Anesthesia Physical Anesthesia Plan  ASA: III  Anesthesia Plan: General ETT   Post-op Pain Management:    Induction:   PONV Risk Score and Plan: 3 and Ondansetron, Dexamethasone, Propofol and Midazolam  Airway Management Planned:   Additional Equipment:   Intra-op Plan:   Post-operative Plan:   Informed Consent: I have reviewed the patients History and Physical, chart, labs and discussed the procedure including the risks, benefits and alternatives for the proposed anesthesia with the patient or authorized representative who has indicated his/her understanding and acceptance.   Dental Advisory Given  Plan Discussed with: CRNA  Anesthesia Plan Comments:         Anesthesia Quick Evaluation

## 2017-05-15 NOTE — Evaluation (Signed)
Physical Therapy Evaluation Patient Details Name: Grant Diaz MRN: [id number] DOB: Feb 16, 1951 Today's Date: 05/15/2017   History of Present Illness  Pt underwent R TKR without reported post-op complications. POD#0 at time of PT evaluation  Clinical Impression  Pt admitted with above diagnosis. Pt currently with functional limitations due to the deficits listed below (see PT Problem List).  Pt does well with therapy on this date. He requires supervision only for bed mobility and CGA for transfers and and ambulation.  He is able to take short shuffling steps from bed to recliner. Cues for sequencing with walker. Decreased weight shifting to RLE. Pt is safe with ambulation. SaO2>95% on room air after walking to recliner. Pt able to complete all supine exercises as instructed and is able to perform R SLR and SAQ without assistance. AAROM is 0-91 degrees and limited primarily by pain with flexion. Plan to return home at discharge with family and [facility] PT. Pt will benefit from PT services to address deficits in strength, balance, and mobility in order to return to full function at home.      Follow Up Recommendations Home health PT    Equipment Recommendations  None recommended by PT    Recommendations for Other Services OT consult     Precautions / Restrictions Precautions Precautions: Fall;Knee Precaution Booklet Issued: Yes (comment) Required Braces or Orthoses: Knee Immobilizer - Right Knee Immobilizer - Right: Discontinue once straight leg raise with < 10 degree lag Restrictions Weight Bearing Restrictions: Yes RLE Weight Bearing: Weight bearing as tolerated      Mobility  Bed Mobility Overal bed mobility: Needs Assistance Bed Mobility: Supine to Sit     Supine to sit: Supervision     General bed mobility comments: Pt requires cues for sequencing. HOB elevated, bed rails utilized, and increased time required  Transfers Overall transfer level: Needs assistance Equipment  used: Rolling walker (2 wheeled) Transfers: Sit to/from Stand Sit to Stand: Min guard         General transfer comment: Pt requires cues for safe hand placement. Increased time required with decreased weight shifting to RLE  Ambulation/Gait Ambulation/Gait assistance: Min guard Ambulation Distance (Feet): 5 Feet Assistive device: Rolling walker (2 wheeled) Gait Pattern/deviations: Decreased step length - right;Decreased step length - left;Decreased stance time - right Gait velocity: Decreased Gait velocity interpretation: <1.8 ft/sec, indicative of risk for recurrent falls General Gait Details: Pt takes short shuffling steps from bed to recliner. Cues for sequencing with walker. Decreased weight shifting to RLE. Pt is safe with ambulation. SaO2>95% on room air following ambulation  Stairs            Wheelchair Mobility    Modified Rankin (Stroke Patients Only)       Balance Overall balance assessment: Needs assistance Sitting-balance support: No upper extremity supported Sitting balance-Leahy Scale: Good     Standing balance support: Bilateral upper extremity supported Standing balance-Leahy Scale: Fair Standing balance comment: Able to maintain balance without UE support on walker                             Pertinent Vitals/Pain Pain Assessment: 0-10 Pain Score: 5  Pain Location: R knee Pain Descriptors / Indicators: Operative site guarding Pain Intervention(s): Monitored during session;Premedicated before session    Home Living Family/patient expects to be discharged to:: Private residence Living Arrangements: Spouse/significant other Available Help at Discharge: Family Type of Home: House Home Access: Ramped entrance  Home Layout: One level Home Equipment: Toilet riser;Shower seat;Walker - 2 wheels;Cane - single point;Grab bars - tub/shower      Prior Function Level of Independence: Independent         Comments: Independent with  ADLs/IADLs. Drives and works. No falls     Hand Dominance   Dominant Hand: Right    Extremity/Trunk Assessment   Upper Extremity Assessment Upper Extremity Assessment: Overall WFL for tasks assessed    Lower Extremity Assessment Lower Extremity Assessment: RLE deficits/detail RLE Deficits / Details: Able to perform SLR and SAQ without assistance. Full DF/PF. Reports intact sensation to light touch. LLE strength appears grossly WFL       Communication   Communication: No difficulties  Cognition Arousal/Alertness: Awake/alert Behavior During Therapy: WFL for tasks assessed/performed Overall Cognitive Status: Within Functional Limits for tasks assessed                                        General Comments      Exercises Total Joint Exercises Ankle Circles/Pumps: AROM;Both;10 reps;Supine Quad Sets: Strengthening;Both;10 reps;Supine Gluteal Sets: Strengthening;Both;10 reps;Supine Towel Squeeze: Strengthening;Both;10 reps;Supine Short Arc Quad: Strengthening;Right;10 reps;Supine Heel Slides: Strengthening;Right;10 reps;Supine Hip ABduction/ADduction: Strengthening;Right;10 reps;Supine Straight Leg Raises: Strengthening;Right;10 reps;Supine Goniometric ROM: 0-91 degrees AAROM, pain limited   Assessment/Plan    PT Assessment Patient needs continued PT services  PT Problem List Decreased strength;Decreased range of motion;Decreased activity tolerance;Decreased balance;Decreased mobility;Pain;Obesity       PT Treatment Interventions DME instruction;Gait training;Stair training;Functional mobility training;Therapeutic activities;Therapeutic exercise;Balance training;Neuromuscular re-education;Patient/family education;Manual techniques    PT Goals (Current goals can be found in the Care Plan section)  Acute Rehab PT Goals Patient Stated Goal: Return to prior function at home with less pain PT Goal Formulation: With patient/family Time For Goal Achievement:  05/29/17 Potential to Achieve Goals: Good    Frequency BID   Barriers to discharge        Co-evaluation               AM-PAC PT "6 Clicks" Daily Activity  Outcome Measure Difficulty turning over in bed (including adjusting bedclothes, sheets and blankets)?: A Little Difficulty moving from lying on back to sitting on the side of the bed? : A Little Difficulty sitting down on and standing up from a chair with arms (e.g., wheelchair, bedside commode, etc,.)?: A Little Help needed moving to and from a bed to chair (including a wheelchair)?: A Little Help needed walking in hospital room?: A Little Help needed climbing 3-5 steps with a railing? : A Lot 6 Click Score: 17    End of Session Equipment Utilized During Treatment: Gait belt Activity Tolerance: Patient tolerated treatment well Patient left: in chair;with call bell/phone within reach;with chair alarm set;with SCD's reapplied;Other (comment) (towel roll under heel, polar care in place) Nurse Communication: Mobility status;Other (comment) (Requesting meds to help nausea) PT Visit Diagnosis: Unsteadiness on feet (R26.81);Muscle weakness (generalized) (M62.81);Other abnormalities of gait and mobility (R26.89);Pain Pain - Right/Left: Right Pain - part of body: Knee    Time: 1600-1630 PT Time Calculation (min) (ACUTE ONLY): 30 min   Charges:   PT Evaluation $PT Eval Low Complexity: 1 Procedure PT Treatments $Therapeutic Exercise: 8-22 mins   PT G Codes:   PT G-Codes **NOT FOR INPATIENT CLASS** Functional Assessment Tool Used: AM-PAC 6 Clicks Basic Mobility Functional Limitation: Mobility: Walking and moving around Mobility: Walking and Moving Around  Current Status [phone]): At least 40 percent but less than 60 percent impaired, limited or restricted Mobility: Walking and Moving Around Goal Status [phone]): At least 1 percent but less than 20 percent impaired, limited or restricted    Phillips Grout PT, DPT     Huprich,Jason 05/15/2017, 4:45 PM

## 2017-05-15 NOTE — Transfer of Care (Signed)
Immediate Anesthesia Transfer of Care Note  Patient: Grant Diaz  Procedure(s) Performed: Procedure(s): TOTAL KNEE ARTHROPLASTY (Right)  Patient Location: PACU  Anesthesia Type:General  Level of Consciousness: awake  Airway & Oxygen Therapy: Patient Spontanous Breathing  Post-op Assessment: Report given to RN and Post -op Vital signs reviewed and stable  Post vital signs: Reviewed and stable  Last Vitals:  Vitals:   05/15/17 0621 05/15/17 0945  BP: (!) 160/86   Pulse: 62   Resp: 14   Temp: 36.5 C (!) (P) 36.3 C    Last Pain:  Vitals:   05/15/17 0621  TempSrc: Tympanic  PainSc: 4          Complications: No apparent anesthesia complications

## 2017-05-15 NOTE — Op Note (Addendum)
05/15/2017  9:40 AM  PATIENT:  Grant Diaz  66 y.o. male  PRE-OPERATIVE DIAGNOSIS:  PRIMARY OSTEOARTHRITIS OF RIGHT KNEE  POST-OPERATIVE DIAGNOSIS:  PRIMARY OSTEOARTHRITIS OF RIGHT KNEE  PROCEDURE:  Procedure(s): TOTAL KNEE ARTHROPLASTY (Right)  SURGEON: Laurene Footman, MD  ASSISTANTS: Rachelle Hora [facility]  ANESTHESIA:   spinal  EBL:  Total I/O In: 1400 [I.V.:1300; IV Piggyback:100] Out: 1000 [Urine:200; Blood:800]  BLOOD ADMINISTERED:none  DRAINS: none   LOCAL MEDICATIONS USED:  MARCAINE    and OTHER morphine Toradol andExparel  SPECIMEN:  No Specimen  DISPOSITION OF SPECIMEN:  N/A  COUNTS:  YES  TOURNIQUET:   59 minutes at 300 mmHg  IMPLANTS: Medacta GMK sphere system with right 5+ femur, 5 tibia with short stem, 13 mm insert with 2 patella, all components cemented  DICTATION: .Dragon Dictation patient brought the operating room and after adequate anesthesia was obtained the right leg was prepped and draped in sterile fashion. After patient identification and timeout procedure were completed a midline skin incision was made followed by medial parapatellar arthrotomy. The anterior cruciate ligament was deficient there was extensive wear of all 3 compartments with exposed bone in all compartments. The proximal tibia was exposed for application of the Medacta my knee block and the proximal tibia cut was carried out followed by excision of the medial meniscus with lateral meniscus since it being gone. The distal femoral exposure was carried out and similar fashion with the Medacta my knee cutting guide placed and the distal cut carried out. The 5+ cutting guide was applied anterior posterior and chamfer cuts made followed by the notch cut for the femur. Approximately tibia is then pulled forward and the tibial baseplate trial centered and rotation based off of a pin left from the initial cutting block. Proximal tibial preparation was carried out with drill hand reaming and then  keel punch. With the femoral trial in place 13 mm insert gave excellent stability with correction of a significant recurvatum that was present at the start of the case. There is good flexion chin and mid flexion stability as well. Distal femoral drill holes were made this point with the trochlear groove cut being performed. All trials were then removed and the patella cut with the patellar cutting guide drilled and measured a size 2. At this point the tourniquet is let down and a partial synovectomy was carried out tourniquet had been raised after the tibia cut because of excessive bleeding. The local anesthetic noted above was infiltrated at this time and the tourniquet then again raised. Bony surfaces thoroughly irrigated and dried. Tibial components cemented in place first with excess cement removed followed by placement of the polyethylene insert and torque screwdriver used to tighten the set screw. Distal femoral component was impacted into place and the knee held in extension. Patella button was clamped into place again with cement being utilized and excess cement removed. After the cemented set excess cement was removed and the patella tracked well but was there is a tight lateral retinaculum so small lateral release was carried out. Tourniquet tourniquet was let down and the knee was thoroughly irrigated and arthrotomy repaired using 0 Ethibond along the medial retinaculum followed by the heavy Quill, 3-0 v-loc subcutaneously followed by skin staples. Xeroform 4 x 4 web roll and Ace wrap applied  PLAN OF CARE: Admit to inpatient   PATIENT DISPOSITION:  PACU - hemodynamically stable.

## 2017-05-15 NOTE — Anesthesia Procedure Notes (Signed)
Performed by: Bessie Livingood       

## 2017-05-15 NOTE — NC FL2 (Signed)
[place] LEVEL OF CARE SCREENING TOOL     IDENTIFICATION  Patient Name: Grant Diaz Birthdate: 1951/10/09 Sex: male Admission Date (Current Location): 05/15/2017  [place] and [place] Number:  Engineering geologist and Address:  [facility], [address] [place], [place] [address]      Provider Number: [id number]  Attending Physician Name and Address:  Hessie Knows, MD  Relative Name and Phone Number:       Current Level of Care: Hospital Recommended Level of Care: [place] Prior Approval Number:    Date Approved/Denied:   PASRR Number:  ([id number])  Discharge Plan: SNF    Current Diagnoses: Patient Active Problem List   Diagnosis Date Noted  . Primary localized osteoarthritis of right knee 05/15/2017  . Primary osteoarthritis of knee 10/12/2015  . Morbid obesity ([place]) 04/28/2011    Orientation RESPIRATION BLADDER Height & Weight     Self, Time, Situation, Place  Normal Continent Weight: 260 lb (117.9 kg) Height:  5\' 10"  (177.8 cm)  BEHAVIORAL SYMPTOMS/MOOD NEUROLOGICAL BOWEL NUTRITION STATUS   (none)  (none) Continent Diet (Diet: Clear Liquid to be Advanced. )  AMBULATORY STATUS COMMUNICATION OF NEEDS Skin   Extensive Assist Verbally Surgical wounds (Incision: Right Knee )                       Personal Care Assistance Level of Assistance  Bathing, Feeding, Dressing Bathing Assistance: Limited assistance Feeding assistance: Independent Dressing Assistance: Limited assistance     Functional Limitations Info  Sight, Hearing, Speech Sight Info: Adequate Hearing Info: Adequate Speech Info: Adequate    SPECIAL CARE FACTORS FREQUENCY  PT (By licensed PT), OT (By licensed OT)     PT Frequency:  (5) OT Frequency:  (5)            Contractures      Additional Factors Info  Code Status, Allergies Code Status Info:  (Full Code. ) Allergies Info:  (Adhesive Tape)            Current Medications (05/15/2017):  This is the current hospital active medication list Current Facility-Administered Medications  Medication Dose Route Frequency Provider Last Rate Last Dose  . 0.9 %  sodium chloride infusion   Intravenous Continuous Hessie Knows, MD 75 mL/hr at 05/15/17 1103    . acetaminophen (TYLENOL) tablet 650 mg  650 mg Oral Q6H PRN Hessie Knows, MD       Or  . acetaminophen (TYLENOL) suppository 650 mg  650 mg Rectal Q6H PRN Hessie Knows, MD      . Derrill Memo ON 05/16/2017] amLODipine (NORVASC) tablet 10 mg  10 mg Oral Daily Hessie Knows, MD      . aspirin EC tablet 81 mg  81 mg Oral Daily Hessie Knows, MD      . bisacodyl (DULCOLAX) suppository 10 mg  10 mg Rectal Daily PRN Hessie Knows, MD      . ceFAZolin (ANCEF) IVPB 2g/100 mL premix  2 g Intravenous Q6H Hessie Knows, MD   Stopped at 05/15/17 1441  . cholecalciferol (VITAMIN D) tablet 1,000 Units  1,000 Units Oral Daily Hessie Knows, MD   1,000 Units at 05/15/17 1209  . diphenhydrAMINE (BENADRYL) 12.5 MG/5ML elixir 12.5-25 mg  12.5-25 mg Oral Q4H PRN Hessie Knows, MD      . docusate sodium (COLACE) capsule 100 mg  100 mg Oral BID Hessie Knows, MD   100 mg at 05/15/17 1209  . [  START ON 05/16/2017] enoxaparin (LOVENOX) injection 30 mg  30 mg Subcutaneous Q12H Hessie Knows, MD      . lisinopril (PRINIVIL,ZESTRIL) tablet 20 mg  20 mg Oral Daily Hessie Knows, MD   20 mg at 05/15/17 1209   And  . hydrochlorothiazide (MICROZIDE) capsule 12.5 mg  12.5 mg Oral Daily Hessie Knows, MD   12.5 mg at 05/15/17 1209  . magnesium citrate solution 1 Bottle  1 Bottle Oral Once PRN Hessie Knows, MD      . magnesium hydroxide (MILK OF MAGNESIA) suspension 30 mL  30 mL Oral Daily PRN Hessie Knows, MD      . menthol-cetylpyridinium (CEPACOL) lozenge 3 mg  1 lozenge Oral PRN Hessie Knows, MD       Or  . phenol (CHLORASEPTIC) mouth spray 1 spray  1 spray Mouth/Throat PRN Hessie Knows, MD      . methocarbamol (ROBAXIN) tablet  500 mg  500 mg Oral Q6H PRN Hessie Knows, MD       Or  . methocarbamol (ROBAXIN) 500 mg in dextrose 5 % 50 mL IVPB  500 mg Intravenous Q6H PRN Hessie Knows, MD      . metoCLOPramide (REGLAN) tablet 5-10 mg  5-10 mg Oral Q8H PRN Hessie Knows, MD       Or  . metoCLOPramide (REGLAN) injection 5-10 mg  5-10 mg Intravenous Q8H PRN Hessie Knows, MD      . morphine 2 MG/ML injection 2 mg  2 mg Intravenous Q2H PRN Hessie Knows, MD      . multivitamin with minerals tablet 1 tablet  1 tablet Oral Daily Hessie Knows, MD      . ondansetron [facility]) tablet 4 mg  4 mg Oral Q6H PRN Hessie Knows, MD   4 mg at 05/15/17 1647   Or  . ondansetron (ZOFRAN) injection 4 mg  4 mg Intravenous Q6H PRN Hessie Knows, MD      . oxyCODONE (Oxy IR/ROXICODONE) immediate release tablet 5-10 mg  5-10 mg Oral Q3H PRN Hessie Knows, MD   10 mg at 05/15/17 1519  . [START ON 05/16/2017] pantoprazole (PROTONIX) EC tablet 40 mg  40 mg Oral Daily Hessie Knows, MD      . triamcinolone (KENALOG) 7.793 % cream 1 application  1 application Topical BID Hessie Knows, MD      . zolpidem (AMBIEN) tablet 5 mg  5 mg Oral QHS PRN Hessie Knows, MD         Discharge Medications: Please see discharge summary for a list of discharge medications.  Relevant Imaging Results:  Relevant Lab Results:   Additional Information  (SSN: [id number])  Zandon Talton, Veronia Beets, LCSW

## 2017-05-15 NOTE — Anesthesia Procedure Notes (Signed)
Procedure Name: Intubation Performed by: Jarome Trull Pre-anesthesia Checklist: Patient identified, Patient being monitored, Timeout performed, Emergency Drugs available and Suction available Patient Re-evaluated:Patient Re-evaluated prior to induction Oxygen Delivery Method: Circle system utilized Preoxygenation: Pre-oxygenation with 100% oxygen Induction Type: IV induction Ventilation: Mask ventilation without difficulty Laryngoscope Size: 3 and Miller Grade View: Grade II Tube type: Oral Tube size: 6.5 mm Number of attempts: 2 Airway Equipment and Method: Stylet Placement Confirmation: ETT inserted through vocal cords under direct vision,  positive ETCO2 and breath sounds checked- equal and bilateral Secured at: 22 cm Tube secured with: Tape Dental Injury: Teeth and Oropharynx as per pre-operative assessment  Comments: Unable to advance 7.5 ett beyond 1 cm and changed to a 6.5 which advanced easily

## 2017-05-15 NOTE — H&P (Signed)
Reviewed paper H+P, will be scanned into chart. No changes noted.  

## 2017-05-15 NOTE — Anesthesia Post-op Follow-up Note (Cosign Needed)
Anesthesia QCDR form completed.        

## 2017-05-16 ENCOUNTER — Encounter: Payer: Self-pay | Admitting: Orthopedic Surgery

## 2017-05-16 LAB — BASIC METABOLIC PANEL
Anion gap: 8 (ref 5–15)
BUN: 23 mg/dL — AB (ref 6–20)
CALCIUM: 8.1 mg/dL — AB (ref 8.9–10.3)
CHLORIDE: 102 mmol/L (ref 101–111)
CO2: 27 mmol/L (ref 22–32)
CREATININE: 0.82 mg/dL (ref 0.61–1.24)
GFR calc non Af Amer: >60 mL/min (ref >60)
Glucose, Bld: 121 mg/dL — ABNORMAL HIGH (ref 65–99)
Potassium: 3.8 mmol/L (ref 3.5–5.1)
SODIUM: 137 mmol/L (ref 135–145)

## 2017-05-16 LAB — CBC
HCT: 33.1 % — ABNORMAL LOW (ref 40.0–52.0)
Hemoglobin: 11.4 g/dL — ABNORMAL LOW (ref 13.0–18.0)
MCH: 31.2 pg (ref 26.0–34.0)
MCHC: 34.4 g/dL (ref 32.0–36.0)
MCV: 90.6 fL (ref 80.0–100.0)
PLATELETS: 192 10*3/uL (ref 150–440)
RBC: 3.66 MIL/uL — ABNORMAL LOW (ref 4.40–5.90)
RDW: 13.5 % (ref 11.5–14.5)
WBC: 11.1 10*3/uL — ABNORMAL HIGH (ref 3.8–10.6)

## 2017-05-16 NOTE — Progress Notes (Signed)
Patient alert and oriented complaining of consistent moderate pain in knee. Patient refusing to wear bone foam after RN discussing benefits. Patient urinating fine. No problems with ambulating.   Deri Fuelling, RN

## 2017-05-16 NOTE — Care Management Note (Signed)
Case Management Note  Patient Details  Name: Grant Diaz MRN: [id number] Date of Birth: 1951-07-23  Subjective/Objective: POD # 1 right TKA. Met with patient to discuss discharge planning. He lives with is wife who is his caregiver. Has a walker at bedside which he brought for PT to evaluate. Offered choice of home health agencies and he prefers [facility]. Referral to Tim with [facility] for home health PT. [facility]: [facility] [phone]. Called Locenox 40 mg #14 no refills. PCP is  Angelina Ok.                    Action/Plan:  [facility] for HHPT, Lovenox called in, No DME needs   Expected Discharge Date:                  Expected Discharge Plan:  [place]  In-House Referral:     Discharge planning Services  CM Consult  Post Acute Care Choice:  Home Health Choice offered to:  Patient  DME Arranged:    DME Agency:     HH Arranged:  PT [place]:  [facility] (formerly [employer])  Status of Service:  In process, will continue to follow  If discussed at Long Length of Stay Meetings, dates discussed:    Additional Comments:  Jolly Mango, RN 05/16/2017, 8:48 AM

## 2017-05-16 NOTE — Progress Notes (Signed)
Physical Therapy Treatment Patient Details Name: Grant Diaz MRN: [id number] DOB: 1951/05/16 Today's Date: 05/16/2017    History of Present Illness Pt underwent R TKR without reported post-op complications. POD#0 at time of PT evaluation    PT Comments    Pt making excellent progress with therapist on this date. He is able to complete supine exercises with improving RLE strength. He is also able to complete a full lap around RN station with rolling walker and CGA. Cues to decrease UE reliance as pt pushes heavily through walker. Gait speed gradually improves with increased distance. Pt denies DOE during ambulation. Cadence is slow but steady. AAROM is improving at 0-98 degrees. Will continue to progress ambulation and attempt stairs at next session if appropriate. Pt will benefit from PT services to address deficits in strength, balance, and mobility in order to return to full function at home.    Follow Up Recommendations  Home health PT     Equipment Recommendations  None recommended by PT    Recommendations for Other Services OT consult     Precautions / Restrictions Precautions Precautions: Fall;Knee Precaution Booklet Issued: Yes (comment) Required Braces or Orthoses: Knee Immobilizer - Right Knee Immobilizer - Right: Discontinue once straight leg raise with < 10 degree lag Restrictions Weight Bearing Restrictions: Yes RLE Weight Bearing: Weight bearing as tolerated    Mobility  Bed Mobility Overal bed mobility: Modified Independent Bed Mobility: Supine to Sit     Supine to sit: Modified independent (Device/Increase time)     General bed mobility comments: Pt with improved speed and sequencing with bed mobility today. HOB only minimally elevated and does not require use of bed rails  Transfers Overall transfer level: Needs assistance Equipment used: Rolling walker (2 wheeled) Transfers: Sit to/from Stand Sit to Stand: Min guard         General transfer  comment: Pt requires increased time to come to standing with decreased weight shift to RLE. Cues for safe hand placement. Good control with descent  Ambulation/Gait Ambulation/Gait assistance: Min guard Ambulation Distance (Feet): 220 Feet Assistive device: Rolling walker (2 wheeled) Gait Pattern/deviations: Decreased step length - left;Decreased stance time - right;Decreased weight shift to right Gait velocity: Decreased Gait velocity interpretation: <1.8 ft/sec, indicative of risk for recurrent falls General Gait Details: Pt able to complete a full lap around RN station with rolling walker and CGA. Cues to decrease UE reliance as pt pushes heavily through walker. Gait speed gradually improves with increased distance. Pt denies DOE during ambulation. Cadence is slow but steady   Financial trader [place] (Stroke Patients Only)       Balance Overall balance assessment: Needs assistance Sitting-balance support: No upper extremity supported Sitting balance-Leahy Scale: Good     Standing balance support: Bilateral upper extremity supported Standing balance-Leahy Scale: Fair Standing balance comment: Able to maintain balance without UE support in order to pull up underwear/shorts                            Cognition Arousal/Alertness: Awake/alert Behavior During Therapy: WFL for tasks assessed/performed Overall Cognitive Status: Within Functional Limits for tasks assessed  Exercises Total Joint Exercises Ankle Circles/Pumps: AROM;Both;10 reps;Supine Quad Sets: Strengthening;Both;10 reps;Supine Gluteal Sets: Strengthening;Both;10 reps;Supine Towel Squeeze: Strengthening;Both;10 reps;Supine Short Arc Quad: Strengthening;Right;10 reps;Supine Heel Slides: Strengthening;Right;10 reps;Supine Hip ABduction/ADduction: Strengthening;Right;10 reps;Supine Straight Leg Raises:  Strengthening;Right;10 reps;Supine Goniometric ROM: 0-98 degrees AAROM, flexion limited by pain and dressing    General Comments        Pertinent Vitals/Pain Pain Assessment: 0-10 Pain Score: 5  Pain Location: R knee Pain Descriptors / Indicators: Operative site guarding Pain Intervention(s): Premedicated before session;Monitored during session;Repositioned    Home Living                      Prior Function            PT Goals (current goals can now be found in the care plan section) Acute Rehab PT Goals Patient Stated Goal: Return to prior function at home with less pain PT Goal Formulation: With patient/family Time For Goal Achievement: 05/29/17 Potential to Achieve Goals: Good Progress towards PT goals: Progressing toward goals    Frequency    BID      PT Plan Current plan remains appropriate    Co-evaluation              AM-PAC PT "6 Clicks" Daily Activity  Outcome Measure  Difficulty turning over in bed (including adjusting bedclothes, sheets and blankets)?: A Little Difficulty moving from lying on back to sitting on the side of the bed? : A Little Difficulty sitting down on and standing up from a chair with arms (e.g., wheelchair, bedside commode, etc,.)?: A Little Help needed moving to and from a bed to chair (including a wheelchair)?: A Little Help needed walking in hospital room?: A Little Help needed climbing 3-5 steps with a railing? : A Little 6 Click Score: 18    End of Session Equipment Utilized During Treatment: Gait belt Activity Tolerance: Patient tolerated treatment well Patient left: in chair;with call bell/phone within reach;with chair alarm set;with SCD's reapplied;Other (comment) (towel roll under heel, polar care in place) Nurse Communication: Mobility status PT Visit Diagnosis: Unsteadiness on feet (R26.81);Muscle weakness (generalized) (M62.81);Other abnormalities of gait and mobility (R26.89);Pain Pain - Right/Left:  Right Pain - part of body: Knee     Time: 9476-5465 PT Time Calculation (min) (ACUTE ONLY): 27 min  Charges:  $Gait Training: 8-22 mins $Therapeutic Exercise: 8-22 mins                    G Codes:       Lyndel Safe Muzamil Harker PT, DPT     Zyairah Wacha 05/16/2017, 10:42 AM

## 2017-05-16 NOTE — Progress Notes (Signed)
   Subjective: 1 Day Post-Op Procedure(s) (LRB): TOTAL KNEE ARTHROPLASTY (Right) Patient reports pain as 4 on 0-10 scale.   Patient is well, and has had no acute complaints or problems. Mild nausea yesterday that has resolved. Denies any CP, SOB, ABD pain. We will continue therapy today.  Plan is to go Home after hospital stay.  Objective: Vital signs in last 24 hours: Temp:  [97.4 F (36.3 C)-98.8 F (37.1 C)] 97.7 F (36.5 C) (07/25 0742) Pulse Rate:  [44-97] 66 (07/25 0742) Resp:  [15-28] 18 (07/25 0742) BP: (133-167)/(55-85) 148/85 (07/25 0742) SpO2:  [91 %-100 %] 100 % (07/25 0742)  Intake/Output from previous day: 07/24 0701 - 07/25 0700 In: 3426.3 [P.O.:480; I.V.:2546.3; IV Piggyback:400] Out: 1800 [Urine:1500; Blood:300] Intake/Output this shift: Total I/O In: 375 [I.V.:375] Out: -    Recent Labs  05/15/17 1129 05/16/17 0352  HGB 12.8* 11.4*    Recent Labs  05/15/17 1129 05/16/17 0352  WBC 17.7* 11.1*  RBC 4.18* 3.66*  HCT 37.9* 33.1*  PLT 203 192    Recent Labs  05/15/17 1129 05/16/17 0352  NA  --  137  K  --  3.8  CL  --  102  CO2  --  27  BUN  --  23*  CREATININE 0.90 0.82  GLUCOSE  --  121*  CALCIUM  --  8.1*   No results for input(s): LABPT, INR in the last 72 hours.  EXAM General - Patient is Alert, Appropriate and Oriented Extremity - Neurovascular intact Sensation intact distally Intact pulses distally Dorsiflexion/Plantar flexion intact No cellulitis present Compartment soft Dressing - dressing C/D/I and no drainage Motor Function - intact, moving foot and toes well on exam.   Past Medical History:  Diagnosis Date  . Arthritis   . 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 Chronic kidney disease    kidney stones  remote history  . GERD (gastroesophageal reflux disease)   . Hypertension   . Joint pain   . Pancreatitis 2013   [facility]  . Pulmonary embolus [facility]) March 2016   Bilateral pulmonary embolus, [facility]  . Sleep apnea,  obstructive    cpap    Assessment/Plan:   1 Day Post-Op Procedure(s) (LRB): TOTAL KNEE ARTHROPLASTY (Right) Active Problems:   Primary localized osteoarthritis of right knee  Estimated body mass index is 37.31 kg/m as calculated from the following:   Height as of this encounter: 5\' 10"  (1.778 m).   Weight as of this encounter: 117.9 kg (260 lb). Advance diet Up with therapy  Needs BM Recheck labs in the am CM to assist with discharge  DVT Prophylaxis - Lovenox, Foot Pumps and TED hose Weight-Bearing as tolerated to right leg   T. Rachelle Hora, PA-C [place] 05/16/2017, 8:09 AM

## 2017-05-16 NOTE — Progress Notes (Signed)
Physical Therapy Treatment Patient Details Name: Grant Diaz MRN: [id number] DOB: 1951-06-22 Today's Date: 05/16/2017    History of Present Illness Pt underwent R TKR without reported post-op complications. POD#0 at time of PT evaluation    PT Comments    Pt continues to make excellent progress with therapy. He is able to complete a lap around the RN station again this afternoon as well as complete stair training. No safety concerns noted on stairs. Pt is able to complete seated exercises with improving RLE strength. Pt has met all PT goals for discharge and will be safe to return home with wife and [facility] PT once medically appropriate. Pt will benefit from PT services to address deficits in strength, balance, and mobility in order to return to full function at home.     Follow Up Recommendations  Home health PT     Equipment Recommendations  None recommended by PT    Recommendations for Other Services OT consult     Precautions / Restrictions Precautions Precautions: Fall;Knee Precaution Booklet Issued: No Required Braces or Orthoses: Knee Immobilizer - Right Knee Immobilizer - Right: Discontinue once straight leg raise with < 10 degree lag Restrictions Weight Bearing Restrictions: Yes RLE Weight Bearing: Weight bearing as tolerated    Mobility  Bed Mobility Overal bed mobility: Modified Independent Bed Mobility: Supine to Sit     Supine to sit: Modified independent (Device/Increase time)     General bed mobility comments: Improving speed/sequencing with bed mobility. No assist required. HOB elevated but does not use bed rails  Transfers Overall transfer level: Needs assistance Equipment used: Rolling walker (2 wheeled) Transfers: Sit to/from Stand Sit to Stand: Supervision         General transfer comment: Pt demonstrates improving speed, strength, and stability. Practiced toilet transfers with patient and he demonstrates good eccentric control with lowering.  Good balance in standing without UE support  Ambulation/Gait Ambulation/Gait assistance: Min guard Ambulation Distance (Feet): 260 Feet Assistive device: Rolling walker (2 wheeled) Gait Pattern/deviations: Decreased step length - left;Decreased stance time - right;Decreased weight shift to right Gait velocity: Decreased Gait velocity interpretation: <1.8 ft/sec, indicative of risk for recurrent falls General Gait Details: Pt is able to complete a full lap around RN station again with rolling walker and CGA. He is also able to walk into the rehab gym and practice stairs. Cues to progress to reciprocal gait pattern. Denies DOE and speed is improved from this morning and improves throughout ambulation distance this afternoon. Mild increase in pain with ambulation   Stairs Stairs: Yes   Stair Management: Two rails;Step to pattern Number of Stairs: 4 General stair comments: Education about proper sequencing. Good strength and stability with stairs. No safety concerns  Wheelchair Mobility    Modified Rankin (Stroke Patients Only)       Balance Overall balance assessment: Needs assistance Sitting-balance support: No upper extremity supported Sitting balance-Leahy Scale: Good     Standing balance support: Bilateral upper extremity supported Standing balance-Leahy Scale: Fair Standing balance comment: Able to maintain balance without UE support in order to pull up underwear/shorts                            Cognition Arousal/Alertness: Awake/alert Behavior During Therapy: WFL for tasks assessed/performed Overall Cognitive Status: Within Functional Limits for tasks assessed  Exercises Total Joint Exercises Hip ABduction/ADduction: Strengthening;Both;10 reps;Seated Long Arc Quad: Strengthening;Right;10 reps;Seated Knee Flexion: Strengthening;Right;10 reps;Seated Marching in Standing: Strengthening;Both;10  reps;Seated Other Exercises Other Exercises: pt/spouse educated in home/routines modifications including pet care, home set up, to maximize safety/functional independence in the home, both verbalized understanding    General Comments        Pertinent Vitals/Pain Pain Assessment: 0-10 Pain Score: 5  Pain Location: R knee Pain Descriptors / Indicators: Operative site guarding Pain Intervention(s): Limited activity within patient's tolerance;Monitored during session    [place] expects to be discharged to:: Private residence Living Arrangements: Spouse/significant other Available Help at Discharge: Family Type of Home: House Home Access: Ramped entrance   Home Layout: One level Home Equipment: Toilet riser;Shower seat;Walker - 2 wheels;Cane - single point;Grab bars - tub/shower;Grab bars - toilet;Hand held shower head      Prior Function Level of Independence: Independent      Comments: Independent with ADLs/IADLs. Drives and works. No falls   PT Goals (current goals can now be found in the care plan section) Acute Rehab PT Goals Patient Stated Goal: return to PLOF with decreased pain PT Goal Formulation: With patient/family Time For Goal Achievement: 05/29/17 Potential to Achieve Goals: Good Progress towards PT goals: Progressing toward goals    Frequency    BID      PT Plan Current plan remains appropriate    Co-evaluation              AM-PAC PT "6 Clicks" Daily Activity  Outcome Measure  Difficulty turning over in bed (including adjusting bedclothes, sheets and blankets)?: A Little Difficulty moving from lying on back to sitting on the side of the bed? : A Little Difficulty sitting down on and standing up from a chair with arms (e.g., wheelchair, bedside commode, etc,.)?: A Little Help needed moving to and from a bed to chair (including a wheelchair)?: A Little Help needed walking in hospital room?: A Little Help needed climbing 3-5  steps with a railing? : A Little 6 Click Score: 18    End of Session Equipment Utilized During Treatment: Gait belt Activity Tolerance: Patient tolerated treatment well Patient left: with call bell/phone within reach;with SCD's reapplied;Other (comment);in bed;with bed alarm set (bone foam under heel, polar care in place) Nurse Communication: Other (comment) (requesting shorter IV attachment) PT Visit Diagnosis: Unsteadiness on feet (R26.81);Muscle weakness (generalized) (M62.81);Other abnormalities of gait and mobility (R26.89);Pain Pain - Right/Left: Right Pain - part of body: Knee     Time: 1400-1433 PT Time Calculation (min) (ACUTE ONLY): 33 min  Charges:  $Gait Training: 8-22 mins $Therapeutic Exercise: 8-22 mins                    G Codes:       Lyndel Safe Willy Pinkerton PT, DPT     Candid Bovey 05/16/2017, 3:37 PM

## 2017-05-16 NOTE — Progress Notes (Signed)
Clinical Social Worker (CSW) received SNF consult. PT is recommending home health. RN case manager is aware of above. Please reconsult if future social work needs arise. CSW signing off.   Alixandrea Milleson, LCSW [phone] 

## 2017-05-16 NOTE — Evaluation (Signed)
Occupational Therapy Evaluation Patient Details Name: Grant Diaz MRN: [id number] DOB: 1951/08/12 Today's Date: 05/16/2017    History of Present Illness Pt underwent R TKR without reported post-op complications. POD#1 at time of OT evaluation.   Clinical Impression   Pt seen for OT evaluation this date. Pt is making good progress with ADLs and is able to reach LEs now without any AD.   Pt was independent in all ADLs prior to surgery and is eager to return to PLOF. Pt previously had L TKA in 09/2015. Pt currently requires moderate assist for LB dressing while in seated position due to pain and limited AROM of R knee.  Pt would benefit from instruction in dressing techniques with or without assistive devices for dressing and bathing skills.  Pt would also benefit from recommendations for home modifications to increase safety in the bathroom and prevent falls. Will assess for OT [facility] needs as pt progresses in therapy, but at this time, do not anticipate any additional OT needs following hospitalization.      Follow Up Recommendations  No OT follow up    Equipment Recommendations  None recommended by OT    Recommendations for Other Services       Precautions / Restrictions Precautions Precautions: Fall;Knee Precaution Booklet Issued: No Required Braces or Orthoses: Knee Immobilizer - Right Knee Immobilizer - Right: Discontinue once straight leg raise with < 10 degree lag Restrictions Weight Bearing Restrictions: Yes RLE Weight Bearing: Weight bearing as tolerated      Mobility Bed Mobility      General bed mobility comments: deferred, pt up in recliner   Transfers Overall transfer level: Needs assistance Equipment used: Rolling walker (2 wheeled) Transfers: Sit to/from Stand Sit to Stand: Min guard         General transfer comment: Pt requires increased time to come to standing with decreased weight shift to RLE. Cues for safe hand placement. Good control with  descent    Balance Overall balance assessment: Needs assistance Sitting-balance support: No upper extremity supported Sitting balance-Leahy Scale: Good     Standing balance support: Bilateral upper extremity supported Standing balance-Leahy Scale: Fair Standing balance comment: Able to maintain balance without UE support in order to pull up underwear/shorts                           ADL either performed or assessed with clinical judgement   ADL Overall ADL's : Needs assistance/impaired                                       General ADL Comments: pt generally at min assist level for LB ADL tasks, spouse present and willing/able to support pt as needed; educated in [place] for LB dressing tasks and compression stocking mgt.     Vision Baseline Vision/History: Wears glasses Wears Glasses: At all times;Reading only (has both) Patient Visual Report: No change from baseline Vision Assessment?: No apparent visual deficits     Perception     Praxis      Pertinent Vitals/Pain Pain Assessment: 0-10 Pain Score: 5  Pain Location: R knee Pain Descriptors / Indicators: Operative site guarding Pain Intervention(s): Limited activity within patient's tolerance;Monitored during session     Hand Dominance Right   Extremity/Trunk Assessment Upper Extremity Assessment Upper Extremity Assessment: Overall WFL for tasks assessed   Lower Extremity Assessment Lower  Extremity Assessment: RLE deficits/detail;Defer to PT evaluation RLE Deficits / Details: Able to perform SLR and SAQ without assistance. Full DF/PF. Reports intact sensation to light touch. LLE strength appears grossly WFL   Cervical / Trunk Assessment Cervical / Trunk Assessment: Normal   Communication Communication Communication: No difficulties   Cognition Arousal/Alertness: Awake/alert Behavior During Therapy: WFL for tasks assessed/performed Overall Cognitive Status: Within Functional Limits for  tasks assessed                                     General Comments       Exercises Other Exercises Other Exercises: pt/spouse educated in home/routines modifications including pet care, home set up, to maximize safety/functional independence in the home, both verbalized understanding   Shoulder Instructions      Home Living Family/patient expects to be discharged to:: Private residence Living Arrangements: Spouse/significant other Available Help at Discharge: Family Type of Home: House Home Access: Ramped entrance     [place]: One level     Bathroom Shower/Tub: Occupational psychologist: Handicapped height Bathroom Accessibility: Yes How Accessible: Accessible via [place]: Toilet riser;Shower seat;Walker - 2 wheels;Cane - single point;Grab bars - tub/shower;Grab bars - toilet;Hand held shower head          Prior Functioning/Environment Level of Independence: Independent        Comments: Independent with ADLs/IADLs. Drives and works. No falls        OT Problem List: Decreased strength;Pain;Decreased range of motion;Decreased knowledge of use of DME or AE      OT Treatment/Interventions: Self-care/ADL training;Therapeutic exercise;Therapeutic activities;DME and/or AE instruction;Patient/family education    OT Goals(Current goals can be found in the care plan section) Acute Rehab OT Goals Patient Stated Goal: return to PLOF with decreased pain OT Goal Formulation: With patient/family Time For Goal Achievement: 05/30/17 Potential to Achieve Goals: Good  OT Frequency: Min 1X/week   Barriers to D/C:            Co-evaluation              AM-PAC PT "6 Clicks" Daily Activity     Outcome Measure Help from another person eating meals?: None Help from another person taking care of personal grooming?: None Help from another person toileting, which includes using toliet, bedpan, or urinal?: A Little Help from another  person bathing (including washing, rinsing, drying)?: A Little Help from another person to put on and taking off regular upper body clothing?: None Help from another person to put on and taking off regular lower body clothing?: A Little 6 Click Score: 21   End of Session    Activity Tolerance: Patient tolerated treatment well Patient left: in chair;with call bell/phone within reach;with chair alarm set;with family/visitor present;with SCD's reapplied;Other (comment) (polar care in place)  OT Visit Diagnosis: Other abnormalities of gait and mobility (R26.89);Muscle weakness (generalized) (M62.81);Pain Pain - Right/Left: Right Pain - part of body: Knee                Time: 1005-1038 OT Time Calculation (min): 33 min Charges:  OT General Charges $OT Visit: 1 Procedure OT Evaluation $OT Eval Low Complexity: 1 Procedure OT Treatments $Self Care/Home Management : 8-22 mins G-Codes:     Jeni Salles, MPH, MS, OTR/L ascom [phone] 05/16/17, 12:10 PM

## 2017-05-16 NOTE — Anesthesia Postprocedure Evaluation (Signed)
Anesthesia Post Note  Patient: Grant Diaz  Procedure(s) Performed: Procedure(s) (LRB): TOTAL KNEE ARTHROPLASTY (Right)  Patient location during evaluation: Nursing Unit Anesthesia Type: General Level of consciousness: oriented and awake and alert Pain management: pain level controlled Vital Signs Assessment: post-procedure vital signs reviewed and stable Respiratory status: spontaneous breathing and respiratory function stable Cardiovascular status: blood pressure returned to baseline and stable Postop Assessment: no headache and no backache Anesthetic complications: no     Last Vitals:  Vitals:   05/15/17 2235 05/16/17 0503  BP: 138/82 138/79  Pulse: 81 81  Resp:  18  Temp: 37.1 C 36.7 C    Last Pain:  Vitals:   05/16/17 0508  TempSrc:   PainSc: 2                  Silvana Newness A

## 2017-05-16 NOTE — Progress Notes (Signed)
Patient refuses bone foam. Educated patient about the MD order for bone foam and reason for application. However, patient continued to refuse.

## 2017-05-17 LAB — BASIC METABOLIC PANEL
Anion gap: 5 (ref 5–15)
BUN: 21 mg/dL — AB (ref 6–20)
CO2: 29 mmol/L (ref 22–32)
Calcium: 8.3 mg/dL — ABNORMAL LOW (ref 8.9–10.3)
Chloride: 107 mmol/L (ref 101–111)
Creatinine, Ser: 1.14 mg/dL (ref 0.61–1.24)
GFR calc Af Amer: >60 mL/min (ref >60)
GLUCOSE: 93 mg/dL (ref 65–99)
POTASSIUM: 3.6 mmol/L (ref 3.5–5.1)
Sodium: 141 mmol/L (ref 135–145)

## 2017-05-17 LAB — CBC
HCT: 33.6 % — ABNORMAL LOW (ref 40.0–52.0)
Hemoglobin: 11.5 g/dL — ABNORMAL LOW (ref 13.0–18.0)
MCH: 31.3 pg (ref 26.0–34.0)
MCHC: 34.2 g/dL (ref 32.0–36.0)
MCV: 91.6 fL (ref 80.0–100.0)
PLATELETS: 202 10*3/uL (ref 150–440)
RBC: 3.67 MIL/uL — AB (ref 4.40–5.90)
RDW: 14.1 % (ref 11.5–14.5)
WBC: 9.5 10*3/uL (ref 3.8–10.6)

## 2017-05-17 MED ORDER — ENOXAPARIN SODIUM 40 MG/0.4ML ~~LOC~~ SOLN: 40.0000 mg | SUBCUTANEOUS | 0 refills | Status: DC

## 2017-05-17 MED ORDER — OXYCODONE HCL 5 MG PO TABS: 5.0000 mg | ORAL_TABLET | ORAL | 0 refills | Status: DC | PRN

## 2017-05-17 NOTE — Discharge Summary (Signed)
Physician Discharge Summary  Patient ID: Grant Diaz MRN: [id number] DOB/AGE: 04/22/1951 66 y.o.  Admit date: 05/15/2017 Discharge date: 05/17/2017  Admission Diagnoses:  PRIMARY OSTEOARTHRITIS OF RIGHT KNEE   Discharge Diagnoses: Patient Active Problem List   Diagnosis Date Noted  . Primary localized osteoarthritis of right knee 05/15/2017  . Primary osteoarthritis of knee 10/12/2015  . Morbid obesity ([place]) 04/28/2011    Past Medical History:  Diagnosis Date  . Arthritis   . 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 Chronic kidney disease    kidney stones  remote history  . GERD (gastroesophageal reflux disease)   . Hypertension   . Joint pain   . Pancreatitis 2013   [facility]  . Pulmonary embolus [facility]) March 2016   Bilateral pulmonary embolus, [facility]  . Sleep apnea, obstructive    cpap     Transfusion: none   Consultants (if any):   Discharged Condition: Improved  Hospital Course: Grant Diaz is an 66 y.o. male who was admitted 05/15/2017 with a diagnosis of right knee osteoarthritis and went to the operating room on 05/15/2017 and underwent the above named procedures.    Surgeries: Procedure(s): TOTAL KNEE ARTHROPLASTY on 05/15/2017 Patient tolerated the surgery well. Taken to PACU where she was stabilized and then transferred to the orthopedic floor.  Started on Lovenox 30 q 12 hrs. Foot pumps applied bilaterally at 80 mm. Heels elevated on bed with rolled towels. No evidence of DVT. Negative [place]. Physical therapy started on day #1 for gait training and transfer. OT started day #1 for ADL and assisted devices.  Patient's foley was d/c on day #1. Patient's IV  was d/c on day #2.  On post op day #2 patient was stable and ready for discharge to home with HHPT.  Implants: Medacta GMK sphere system with right 5+ femur, 5 tibia with short stem, 13 mm insert with 2 patella, all components cemented   He was given perioperative antibiotics:  Anti-infectives    Start      Dose/Rate Route Frequency Ordered Stop   05/15/17 1400  ceFAZolin (ANCEF) IVPB 2g/100 mL premix     2 g 200 mL/hr over 30 Minutes Intravenous Every 6 hours 05/15/17 1048 05/16/17 0305   05/15/17 0611  ceFAZolin (ANCEF) 2-4 GM/100ML-% IVPB    Comments:  Larita Fife   : cabinet override      05/15/17 0611 05/15/17 0743   05/14/17 2245  ceFAZolin (ANCEF) IVPB 2g/100 mL premix     2 g 200 mL/hr over 30 Minutes Intravenous  Once 05/14/17 2235 05/15/17 0758    .  He was given sequential compression devices, early ambulation, and Lovenox for DVT prophylaxis.  He benefited maximally from the hospital stay and there were no complications.    Recent vital signs:  Vitals:   05/17/17 0023 05/17/17 0700  BP: (!) 150/87 (!) 147/71  Pulse: 92 89  Resp:  18  Temp: 98.4 F (36.9 C) 98.6 F (37 C)    Recent laboratory studies:  Lab Results  Component Value Date   HGB 11.5 (L) 05/17/2017   HGB 11.4 (L) 05/16/2017   HGB 12.8 (L) 05/15/2017   Lab Results  Component Value Date   WBC 9.5 05/17/2017   PLT 202 05/17/2017   Lab Results  Component Value Date   INR 1.08 05/09/2017   Lab Results  Component Value Date   NA 141 05/17/2017   K 3.6 05/17/2017   CL 107 05/17/2017  CO2 29 05/17/2017   BUN 21 (H) 05/17/2017   CREATININE 1.14 05/17/2017   GLUCOSE 93 05/17/2017    Discharge Medications:   Allergies as of 05/17/2017      Reactions   Adhesive [tape] Rash   "whelps", latex adhesive tape only, paper tape is ok       Medication List    STOP taking these medications   traMADol 50 MG tablet Commonly known as:  ULTRAM     TAKE these medications   acetaminophen 325 MG tablet Commonly known as:  TYLENOL Take 650 mg by mouth every 6 (six) hours as needed.   amLODipine 10 MG tablet Commonly known as:  NORVASC Take 10 mg by mouth daily.   aspirin EC 81 MG tablet Take 81 mg by mouth daily.   celecoxib 200 MG capsule Commonly known as:  CELEBREX Take 200 mg by  mouth daily.   D3-1000 1000 units capsule Generic drug:  Cholecalciferol Take 1,000 Units by mouth daily.   enoxaparin 40 MG/0.4ML injection Commonly known as:  LOVENOX Inject 0.4 mLs (40 mg total) into the skin daily.   lisinopril-hydrochlorothiazide 20-12.5 MG tablet Commonly known as:  PRINZIDE,ZESTORETIC Take 1 tablet by mouth every morning.   multivitamin capsule Take 1 capsule by mouth daily.   omeprazole 20 MG capsule Commonly known as:  PRILOSEC Take 20 mg by mouth every other day.   oxyCODONE 5 MG immediate release tablet Commonly known as:  Oxy IR/ROXICODONE Take 1-2 tablets (5-10 mg total) by mouth every 4 (four) hours as needed for breakthrough pain.   triamcinolone 0.025 % cream Commonly known as:  KENALOG Apply 1 application topically 2 (two) times daily as needed (skin rash).   Turmeric 500 MG Caps Take 1 capsule by mouth daily.       Diagnostic Studies: Dg Knee 1-2 Views Right  Result Date: 05/15/2017 CLINICAL DATA:  Post right total knee replacement EXAM: RIGHT KNEE - 1-2 VIEW COMPARISON:  CT of the right knee of 04/13/2017 FINDINGS: The femoral and tibial components of the right total knee replacement appear to be in good position. No complicating features are seen. Some air is noted in the soft tissues and joint space postoperatively. IMPRESSION: Right total knee replacement components in good position. No complicating features. Electronically Signed   By: Ivar Drape M.D.   On: 05/15/2017 10:31    Disposition: 01-Home or Self Care    Follow-up Information    Hessie Knows, MD Follow up in 2 week(s).   Specialty:  Orthopedic Surgery Contact information: [place] [id number] [phone]            Signed: Feliberto Gottron 05/17/2017, 8:25 AM

## 2017-05-17 NOTE — Progress Notes (Signed)
Pt discharged to home via wheelchair without incident per MD order accompanied by spouse. Prior to discharge, all discharge teachings done both written and verbal. All questions answered. Wife and patient verbalize understanding and agrees to comply. Pt discharged with prescriptions for oxycodone and Lovenox. No change in pt's condition from AM assessment. Pt pain tolerable on discharge.

## 2017-05-17 NOTE — Progress Notes (Signed)
   Subjective: 2 Days Post-Op Procedure(s) (LRB): TOTAL KNEE ARTHROPLASTY (Right) Patient reports pain as 4 on 0-10 scale.   Patient is well, and has had no acute complaints or problems.  Denies any CP, SOB, ABD pain. We will continue therapy today.  Plan is to go Home after hospital stay.  Objective: Vital signs in last 24 hours: Temp:  [97.6 F (36.4 C)-98.6 F (37 C)] 98.6 F (37 C) (07/26 0700) Pulse Rate:  [86-92] 89 (07/26 0700) Resp:  [18] 18 (07/26 0700) BP: (147-151)/(66-87) 147/71 (07/26 0700) SpO2:  [99 %-100 %] 99 % (07/26 0700)  Intake/Output from previous day: 07/25 0701 - 07/26 0700 In: 1615 [P.O.:1240; I.V.:375] Out: 3000 [Urine:3000] Intake/Output this shift: Total I/O In: -  Out: 100 [Urine:100]   Recent Labs  05/15/17 1129 05/16/17 0352 05/17/17 0405  HGB 12.8* 11.4* 11.5*    Recent Labs  05/16/17 0352 05/17/17 0405  WBC 11.1* 9.5  RBC 3.66* 3.67*  HCT 33.1* 33.6*  PLT 192 202    Recent Labs  05/16/17 0352 05/17/17 0405  NA 137 141  K 3.8 3.6  CL 102 107  CO2 27 29  BUN 23* 21*  CREATININE 0.82 1.14  GLUCOSE 121* 93  CALCIUM 8.1* 8.3*   No results for input(s): LABPT, INR in the last 72 hours.  EXAM General - Patient is Alert, Appropriate and Oriented Extremity - Neurovascular intact Sensation intact distally Intact pulses distally Dorsiflexion/Plantar flexion intact No cellulitis present Compartment soft Dressing - dressing C/D/I and no drainage Motor Function - intact, moving foot and toes well on exam.   Past Medical History:  Diagnosis Date  . Arthritis   . 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 Chronic kidney disease    kidney stones  remote history  . GERD (gastroesophageal reflux disease)   . Hypertension   . Joint pain   . Pancreatitis 2013   [facility]  . Pulmonary embolus [facility]) March 2016   Bilateral pulmonary embolus, [facility]  . Sleep apnea, obstructive    cpap    Assessment/Plan:   2 Days Post-Op  Procedure(s) (LRB): TOTAL KNEE ARTHROPLASTY (Right) Active Problems:   Primary localized osteoarthritis of right knee  Estimated body mass index is 37.31 kg/m as calculated from the following:   Height as of this encounter: 5\' 10"  (1.778 m).   Weight as of this encounter: 117.9 kg (260 lb). Advance diet Up with therapy  Discharge home with HHPT today  DVT Prophylaxis - Lovenox, Foot Pumps and TED hose Weight-Bearing as tolerated to right leg   T. Rachelle Hora, PA-C [place] 05/17/2017, 8:20 AM

## 2017-05-17 NOTE — Progress Notes (Signed)
OT Cancellation Note  Patient Details Name: Grant Diaz MRN: [id number] DOB: 10-13-1951   Cancelled Treatment:    Reason Eval/Treat Not Completed: Patient declined, no reason specified. Pt politely declining OT treatment this date. Pt waiting for discharge instructions and looking forward to returning home today. Pt reports no additional OT needs. Will sign off.   Jeni Salles, MPH, MS, OTR/L ascom [phone] 05/17/17, 11:57 AM

## 2017-05-17 NOTE — Discharge Instructions (Signed)

## 2017-05-17 NOTE — Care Management Note (Signed)
Case Management Note  Patient Details  Name: LIVINGSTON DENNER MRN: [id number] Date of Birth: 16-Apr-1951  Subjective/Objective:  Discharging today                  Action/Plan: Cost of Lovenox is $ 30. Patient updated and denies issues paying for medications.  [facility] notified of discharge. No DME needs.   Expected Discharge Date:  05/17/17               Expected Discharge Plan:  [place]  In-House Referral:     Discharge planning Services  CM Consult  Post Acute Care Choice:  Home Health Choice offered to:  Patient  DME Arranged:    DME Agency:     HH Arranged:  PT [place]:  [facility] (formerly [employer])  Status of Service:  Completed, signed off  If discussed at [employer], dates discussed:    Additional Comments:  Jolly Mango, RN 05/17/2017, 10:09 AM

## 2017-05-17 NOTE — Progress Notes (Signed)
Physical Therapy Treatment Patient Details Name: Grant Diaz MRN: [id number] DOB: [birth year] Today's Date: 05/17/2017    History of Present Illness Pt underwent R TKR without reported post-op complications. POD#0 at time of PT evaluation    PT Comments    Participated in exercises as described below.  Pt able to ambulate around unit and to bathroom with min guard.  Overall progressing well with no further questions for therapy.  Stair training yesterday and  Declined today as he has no steps into his home.   Follow Up Recommendations  Home health PT     Equipment Recommendations  None recommended by PT    Recommendations for Other Services       Precautions / Restrictions Precautions Precautions: Fall;Knee Precaution Booklet Issued: No Restrictions Weight Bearing Restrictions: Yes RLE Weight Bearing: Weight bearing as tolerated    Mobility  Bed Mobility Overal bed mobility: Modified Independent Bed Mobility: Supine to Sit              Transfers Overall transfer level: Needs assistance Equipment used: Rolling walker (2 wheeled) Transfers: Sit to/from Stand Sit to Stand: Supervision            Ambulation/Gait Ambulation/Gait assistance: Min guard Ambulation Distance (Feet): 200 Feet Assistive device: Rolling walker (2 wheeled) Gait Pattern/deviations: Step-to pattern   Gait velocity interpretation: <1.8 ft/sec, indicative of risk for recurrent falls     Stairs            Wheelchair Mobility    Modified Rankin (Stroke Patients Only)       Balance Overall balance assessment: Needs assistance Sitting-balance support: No upper extremity supported Sitting balance-Leahy Scale: Good     Standing balance support: Bilateral upper extremity supported Standing balance-Leahy Scale: Fair                              Cognition                                              Exercises Total Joint  Exercises Ankle Circles/Pumps: AROM;Both;10 reps;Supine Quad Sets: Strengthening;Both;10 reps;Supine Gluteal Sets: Strengthening;Both;10 reps;Supine Towel Squeeze: Strengthening;Both;10 reps;Supine Short Arc Quad: Strengthening;Right;10 reps;Supine Heel Slides: Strengthening;Right;10 reps;Supine Hip ABduction/ADduction: Strengthening;Both;10 reps;Seated Straight Leg Raises: Strengthening;Right;10 reps;Supine Long Arc Quad: Strengthening;Right;10 reps;Seated Knee Flexion: Strengthening;Right;10 reps;Seated Goniometric ROM: 0-93 Marching in Standing: Strengthening;Both;10 reps;Standing    General Comments        Pertinent Vitals/Pain      Home Living                      Prior Function            PT Goals (current goals can now be found in the care plan section) Progress towards PT goals: Progressing toward goals    Frequency    BID      PT Plan Current plan remains appropriate    Co-evaluation              AM-PAC PT "6 Clicks" Daily Activity  Outcome Measure  Difficulty turning over in bed (including adjusting bedclothes, sheets and blankets)?: A Little Difficulty moving from lying on back to sitting on the side of the bed? : A Little Difficulty sitting down on and standing up from a chair with arms (e.g., wheelchair, bedside commode,  etc,.)?: A Little Help needed moving to and from a bed to chair (including a wheelchair)?: A Little Help needed walking in hospital room?: A Little Help needed climbing 3-5 steps with a railing? : A Little 6 Click Score: 18    End of Session Equipment Utilized During Treatment: Gait belt Activity Tolerance: Patient tolerated treatment well Patient left: with call bell/phone within reach;in chair;with chair alarm set Nurse Communication: Mobility status Pain - Right/Left: Right Pain - part of body: Knee     Time: 7076-1518 PT Time Calculation (min) (ACUTE ONLY): 32 min  Charges:  $Gait Training: 8-22  mins $Therapeutic Exercise: 8-22 mins                    G Codes:       Chesley Noon, PTA 05/17/17, 9:51 AM

## 2017-07-20 ENCOUNTER — Encounter: Payer: Self-pay | Admitting: Unknown Physician Specialty

## 2017-09-25 ENCOUNTER — Ambulatory Visit (INDEPENDENT_AMBULATORY_CARE_PROVIDER_SITE_OTHER): Payer: BC Managed Care – PPO | Admitting: Unknown Physician Specialty

## 2017-09-25 ENCOUNTER — Telehealth: Payer: Self-pay | Admitting: Unknown Physician Specialty

## 2017-09-25 ENCOUNTER — Encounter: Payer: Self-pay | Admitting: Unknown Physician Specialty

## 2017-09-25 DIAGNOSIS — I10 Essential (primary) hypertension: Secondary | ICD-10-CM

## 2017-09-25 DIAGNOSIS — G4733 Obstructive sleep apnea (adult) (pediatric): Secondary | ICD-10-CM

## 2017-09-25 DIAGNOSIS — E669 Obesity, unspecified: Secondary | ICD-10-CM

## 2017-09-25 DIAGNOSIS — Z8546 Personal history of malignant neoplasm of prostate: Secondary | ICD-10-CM

## 2017-09-25 DIAGNOSIS — G4730 Sleep apnea, unspecified: Secondary | ICD-10-CM | POA: Insufficient documentation

## 2017-09-25 DIAGNOSIS — Z96653 Presence of artificial knee joint, bilateral: Secondary | ICD-10-CM

## 2017-09-25 MED ORDER — LISINOPRIL-HYDROCHLOROTHIAZIDE 20-12.5 MG PO TABS: 1.0000 | ORAL_TABLET | ORAL | 1 refills | Status: DC

## 2017-09-25 NOTE — Assessment & Plan Note (Signed)
Stable, continue present medications.   

## 2017-09-25 NOTE — Assessment & Plan Note (Signed)
Per Dr Bernardo Heater

## 2017-09-25 NOTE — Progress Notes (Signed)
BP 115/76 (BP Location: Left Arm, Cuff Size: Normal)   Pulse 84   Temp 98.1 F (36.7 C) (Oral)   Ht 5' 8.5" (1.74 m)   Wt 258 lb 1.6 oz (117.1 kg)   SpO2 99%   BMI 38.67 kg/m    Subjective:    Patient ID: Grant Diaz, male    DOB: 09/06/1951, 66 y.o.   MRN: [id number]  HPI: Grant Diaz is a 66 y.o. male  Chief Complaint  Patient presents with  . Establish Care    Grant Diaz is new to this practice:    Hypertension Using medications without difficulty Average home BPs: Not checking   No problems or lightheadedness No chest pain with exertion or shortness of breath No Edema  Obesity Weight has been stable over the last several years  Sleep apnea Using CPAP since 2011.  Using it nightly.  States it has decreased his snoring, sleeps for longer and wakes more refreshed.  Sleepiness and fatigue has resolved.  Needs a new CPAP with the old one failing  Eczema Groin area.  Uses Triamcinolone one a week  Knee OA S/P bilateral knee replacement  Had labs done by previous PCP in August.    Social History   Socioeconomic History  . Marital status: Married    Spouse name: Not on file  . Number of children: Not on file  . Years of education: Not on file  . Highest education level: Not on file  Social Needs  . Financial resource strain: Not on file  . Food insecurity - worry: Not on file  . Food insecurity - inability: Not on file  . Transportation needs - medical: Not on file  . Transportation needs - non-medical: Not on file  Occupational History  . Not on file  Tobacco Use  . Smoking status: Never Smoker  . Smokeless tobacco: Never Used  Substance and Sexual Activity  . Alcohol use: No  . Drug use: No  . Sexual activity: No  Other Topics Concern  . Not on file  Social History Narrative  . Not on file   Family History  Problem Relation Age of Onset  . Diabetes Mother   . Parkinson's disease Father   . Hypertension Brother   . Diabetes Son    type 1  . Heart disease Maternal Grandmother        CHF  . Stroke Maternal Grandfather   . Heart disease Paternal Grandmother        CHF  . Stroke Paternal Grandfather    Past Medical History:  Diagnosis Date  . Arthritis   . 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 Chronic kidney disease    kidney stones  remote history  . GERD (gastroesophageal reflux disease)   . Hypertension   . Joint pain   . Pancreatitis 2013   [facility]  . Pulmonary embolus [facility]) March 2016   Bilateral pulmonary embolus, [facility]  . Sleep apnea, obstructive    cpap   Past Surgical History:  Procedure Laterality Date  . CARPAL TUNNEL RELEASE Right 03/15/2017   Procedure: CARPAL TUNNEL RELEASE;  Surgeon: Hessie Knows, MD;  Location: [facility] ORS;  Service: Orthopedics;  Laterality: Right;  . CHOLECYSTECTOMY    . ESOPHAGOGASTRODUODENOSCOPY N/A 05/28/2013   Procedure: ESOPHAGOGASTRODUODENOSCOPY (EGD);  Surgeon: Shann Medal, MD;  Location: [facility];  Service: General;  Laterality: N/A;  . GALLBLADDER SURGERY  2003  . GANGLION CYST EXCISION Right 03/15/2017  Procedure: REMOVAL GANGLION OF WRIST;  Surgeon: Hessie Knows, MD;  Location: [facility] ORS;  Service: Orthopedics;  Laterality: Right;  . HERNIA REPAIR    . hose  2011  . KNEE ARTHROSCOPY Bilateral [id number]  . LAPAROSCOPIC GASTRIC BANDING  2009   and hernia repair  . RADIATION IMPLANT W/ ULTRASOUND, MALE  2005   internal/external radiation for prostate cancer  . TOTAL KNEE ARTHROPLASTY Left 10/12/2015   Procedure: TOTAL KNEE ARTHROPLASTY;  Surgeon: Hessie Knows, MD;  Location: [facility] ORS;  Service: Orthopedics;  Laterality: Left;  . TOTAL KNEE ARTHROPLASTY Right 05/15/2017   Procedure: TOTAL KNEE ARTHROPLASTY;  Surgeon: Hessie Knows, MD;  Location: [facility] ORS;  Service: Orthopedics;  Laterality: Right;  . UMBILICAL HERNIA REPAIR  2010    Relevant past medical, surgical, family and social history reviewed and updated as indicated. Interim medical history since  our last visit reviewed. Allergies and medications reviewed and updated.  Review of Systems  Constitutional: Negative.   HENT: Negative.   Eyes: Negative.   Respiratory: Negative.   Cardiovascular: Negative.   Endocrine: Negative.   Genitourinary: Negative.   Musculoskeletal: Negative.   Allergic/Immunologic: Negative.   Neurological: Negative.   Hematological: Negative.   Psychiatric/Behavioral: Negative.     Per HPI unless specifically indicated above     Objective:    BP 115/76 (BP Location: Left Arm, Cuff Size: Normal)   Pulse 84   Temp 98.1 F (36.7 C) (Oral)   Ht 5' 8.5" (1.74 m)   Wt 258 lb 1.6 oz (117.1 kg)   SpO2 99%   BMI 38.67 kg/m   Wt Readings from Last 3 Encounters:  09/25/17 258 lb 1.6 oz (117.1 kg)  05/15/17 260 lb (117.9 kg)  05/09/17 250 lb (113.4 kg)    Physical Exam  Constitutional: He is oriented to person, place, and time. He appears well-developed and well-nourished. No distress.  HENT:  Head: Normocephalic and atraumatic.  Eyes: Conjunctivae and lids are normal. Right eye exhibits no discharge. Left eye exhibits no discharge. No scleral icterus.  Neck: Normal range of motion. Neck supple. No JVD present. Carotid bruit is not present.  Cardiovascular: Normal rate, regular rhythm and normal heart sounds.  Pulmonary/Chest: Effort normal and breath sounds normal. No respiratory distress.  Abdominal: Normal appearance. There is no splenomegaly or hepatomegaly.  Musculoskeletal: Normal range of motion.  Neurological: He is alert and oriented to person, place, and time.  Skin: Skin is warm, dry and intact. No rash noted. No pallor.  Psychiatric: He has a normal mood and affect. His behavior is normal. Judgment and thought content normal.    Results for orders placed or performed during the hospital encounter of 05/15/17  CBC  Result Value Ref Range   WBC 17.7 (H) 3.8 - 10.6 K/uL   RBC 4.18 (L) 4.40 - 5.90 MIL/uL   Hemoglobin 12.8 (L) 13.0 -  18.0 g/dL   HCT 37.9 (L) 40.0 - 52.0 %   MCV 90.7 80.0 - 100.0 fL   MCH 30.7 26.0 - 34.0 pg   MCHC 33.8 32.0 - 36.0 g/dL   RDW 13.8 11.5 - 14.5 %   Platelets 203 150 - 440 K/uL  Creatinine, serum  Result Value Ref Range   Creatinine, Ser 0.90 0.61 - 1.24 mg/dL   GFR calc non Af Amer >60 >60 mL/min   GFR calc Af Amer >60 >60 mL/min  CBC  Result Value Ref Range   WBC 11.1 (H) 3.8 - 10.6 K/uL  RBC 3.66 (L) 4.40 - 5.90 MIL/uL   Hemoglobin 11.4 (L) 13.0 - 18.0 g/dL   HCT 33.1 (L) 40.0 - 52.0 %   MCV 90.6 80.0 - 100.0 fL   MCH 31.2 26.0 - 34.0 pg   MCHC 34.4 32.0 - 36.0 g/dL   RDW 13.5 11.5 - 14.5 %   Platelets 192 150 - 440 K/uL  Basic metabolic panel  Result Value Ref Range   Sodium 137 135 - 145 mmol/L   Potassium 3.8 3.5 - 5.1 mmol/L   Chloride 102 101 - 111 mmol/L   CO2 27 22 - 32 mmol/L   Glucose, Bld 121 (H) 65 - 99 mg/dL   BUN 23 (H) 6 - 20 mg/dL   Creatinine, Ser 0.82 0.61 - 1.24 mg/dL   Calcium 8.1 (L) 8.9 - 10.3 mg/dL   GFR calc non Af Amer >60 >60 mL/min   GFR calc Af Amer >60 >60 mL/min   Anion gap 8 5 - 15  CBC  Result Value Ref Range   WBC 9.5 3.8 - 10.6 K/uL   RBC 3.67 (L) 4.40 - 5.90 MIL/uL   Hemoglobin 11.5 (L) 13.0 - 18.0 g/dL   HCT 33.6 (L) 40.0 - 52.0 %   MCV 91.6 80.0 - 100.0 fL   MCH 31.3 26.0 - 34.0 pg   MCHC 34.2 32.0 - 36.0 g/dL   RDW 14.1 11.5 - 14.5 %   Platelets 202 150 - 440 K/uL  Basic metabolic panel  Result Value Ref Range   Sodium 141 135 - 145 mmol/L   Potassium 3.6 3.5 - 5.1 mmol/L   Chloride 107 101 - 111 mmol/L   CO2 29 22 - 32 mmol/L   Glucose, Bld 93 65 - 99 mg/dL   BUN 21 (H) 6 - 20 mg/dL   Creatinine, Ser 1.14 0.61 - 1.24 mg/dL   Calcium 8.3 (L) 8.9 - 10.3 mg/dL   GFR calc non Af Amer >60 >60 mL/min   GFR calc Af Amer >60 >60 mL/min   Anion gap 5 5 - 15      Assessment & Plan:   Problem List Items Addressed This Visit      Unprioritized   Essential hypertension, benign    Stable, continue present medications.         Relevant Medications   lisinopril-hydrochlorothiazide (PRINZIDE,ZESTORETIC) 20-12.5 MG tablet   History of prostate cancer    Per Dr Bernardo Heater      Obesity (BMI 30-39.9)    Discussed diet and exercise      Sleep apnea    Order written for new CPAP and supplies      Total knee replacement status, bilateral       Follow up plan: Return for February for PE.

## 2017-09-25 NOTE — Telephone Encounter (Signed)
Information printed and faxed as requested. Patient notified that this was done for him.

## 2017-09-25 NOTE — Assessment & Plan Note (Signed)
Discussed diet and exercise 

## 2017-09-25 NOTE — Assessment & Plan Note (Signed)
Order written for new CPAP and supplies

## 2017-09-25 NOTE — Telephone Encounter (Signed)
Patient needs the script and office note sent to [facility] fax # [phone]  Ph# [phone]  With a copy of his [employer] card included.  Thanks

## 2017-10-23 DIAGNOSIS — H269 Unspecified cataract: Secondary | ICD-10-CM

## 2017-10-23 HISTORY — DX: Unspecified cataract: H26.9

## 2017-10-31 ENCOUNTER — Encounter (HOSPITAL_COMMUNITY): Payer: Self-pay

## 2017-11-01 ENCOUNTER — Telehealth (HOSPITAL_COMMUNITY): Payer: Self-pay

## 2017-11-01 NOTE — Telephone Encounter (Signed)
Declined [facility] L[facility]U appt: This patient is overdue for recommended follow-up with a bariatric surgeon at [facility]. An e-mail was sent to the address on file on 1.7.19  from both [place] in attempt to reestablish post-op care. The letter included a patient survey which was returned to the [facility] Bariatric Dept via e-mail on 1.8.19.  Patient declined an appointment at this time advising he is receiving care with [facility] in [place]. Per comments, "my PCP is very nutrition and weightloss proficient." He added that he is scheduled for a physical with PCP on 2.18.19 & will discuss needs at that time. If follow-up with [facility] surgeon is recommended, he will contact the office to schedule an appointment. Copy of the survey was shared today with both [facility] as well as Mallory Shirk at [facility] so she may file in patients office chart.

## 2017-12-03 ENCOUNTER — Encounter: Payer: BC Managed Care – PPO | Admitting: Unknown Physician Specialty

## 2017-12-10 ENCOUNTER — Ambulatory Visit (INDEPENDENT_AMBULATORY_CARE_PROVIDER_SITE_OTHER): Payer: BC Managed Care – PPO | Admitting: Unknown Physician Specialty

## 2017-12-10 ENCOUNTER — Encounter: Payer: Self-pay | Admitting: Unknown Physician Specialty

## 2017-12-10 ENCOUNTER — Other Ambulatory Visit: Payer: Self-pay | Admitting: Unknown Physician Specialty

## 2017-12-10 VITALS — BP 136/72 | HR 81 | Temp 98.4°F | Ht 68.5 in | Wt 266.8 lb

## 2017-12-10 DIAGNOSIS — G4733 Obstructive sleep apnea (adult) (pediatric): Secondary | ICD-10-CM

## 2017-12-10 DIAGNOSIS — Z8546 Personal history of malignant neoplasm of prostate: Secondary | ICD-10-CM

## 2017-12-10 DIAGNOSIS — E669 Obesity, unspecified: Secondary | ICD-10-CM | POA: Diagnosis not present

## 2017-12-10 DIAGNOSIS — Z0000 Encounter for general adult medical examination without abnormal findings: Secondary | ICD-10-CM

## 2017-12-10 DIAGNOSIS — I10 Essential (primary) hypertension: Secondary | ICD-10-CM | POA: Diagnosis not present

## 2017-12-10 DIAGNOSIS — R0602 Shortness of breath: Secondary | ICD-10-CM | POA: Diagnosis not present

## 2017-12-10 MED ORDER — TRIAMCINOLONE ACETONIDE 0.025 % EX CREA: 1.0000 "application " | TOPICAL_CREAM | Freq: Two times a day (BID) | CUTANEOUS | 1 refills | Status: DC | PRN

## 2017-12-10 NOTE — Progress Notes (Signed)
BP 136/72   Pulse 81   Temp 98.4 F (36.9 C) (Oral)   Ht 5' 8.5" (1.74 m)   Wt 266 lb 12.8 oz (121 kg)   SpO2 94%   BMI 39.98 kg/m    Subjective:    Patient ID: Grant Diaz, male    DOB: 1951-04-24, 67 y.o.   MRN: [id number]  HPI: Grant Diaz is a 67 y.o. male  Chief Complaint  Patient presents with  . Annual Exam    pt wants to discuss a tingling feeling he has noticed on and off for a couple of months and a little SOB when walking   Hypertension Using medications without difficulty Average home BPs   No problems or lightheadedness No chest pain with exertion.  He is complaining of SOB when he climbs a hill.   No Edema Tingling right side of chest comes and goes.    Sleep apnea Uses CPAP   Social History   Socioeconomic History  . Marital status: Married    Spouse name: Not on file  . Number of children: Not on file  . Years of education: Not on file  . Highest education level: Not on file  Social Needs  . Financial resource strain: Not on file  . Food insecurity - worry: Not on file  . Food insecurity - inability: Not on file  . Transportation needs - medical: Not on file  . Transportation needs - non-medical: Not on file  Occupational History  . Not on file  Tobacco Use  . Smoking status: Never Smoker  . Smokeless tobacco: Never Used  Substance and Sexual Activity  . Alcohol use: No  . Drug use: No  . Sexual activity: No  Other Topics Concern  . Not on file  Social History Narrative  . Not on file   Family History  Problem Relation Age of Onset  . Diabetes Mother   . Parkinson's disease Father   . Hypertension Brother   . Diabetes Son        type 1  . Heart disease Maternal Grandmother        CHF  . Stroke Maternal Grandfather   . Heart disease Paternal Grandmother        CHF  . Stroke Paternal Grandfather    Past Medical History:  Diagnosis Date  . Arthritis   . 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 Chronic kidney  disease    kidney stones  remote history  . GERD (gastroesophageal reflux disease)   . Hypertension   . Joint pain   . Pancreatitis 2013   [facility]  . Pulmonary embolus [facility]) March 2016   Bilateral pulmonary embolus, [facility]  . Sleep apnea, obstructive    cpap   Past Surgical History:  Procedure Laterality Date  . CARPAL TUNNEL RELEASE Right 03/15/2017   Procedure: CARPAL TUNNEL RELEASE;  Surgeon: Hessie Knows, MD;  Location: [facility] ORS;  Service: Orthopedics;  Laterality: Right;  . CHOLECYSTECTOMY    . ESOPHAGOGASTRODUODENOSCOPY N/A 05/28/2013   Procedure: ESOPHAGOGASTRODUODENOSCOPY (EGD);  Surgeon: Shann Medal, MD;  Location: [facility];  Service: General;  Laterality: N/A;  . GALLBLADDER SURGERY  2003  . GANGLION CYST EXCISION Right 03/15/2017   Procedure: REMOVAL GANGLION OF WRIST;  Surgeon: Hessie Knows, MD;  Location: [facility] ORS;  Service: Orthopedics;  Laterality: Right;  . HERNIA REPAIR    . hose  2011  . KNEE ARTHROSCOPY Bilateral [id number]  . LAPAROSCOPIC GASTRIC BANDING  2009   and hernia repair  . RADIATION IMPLANT W/ ULTRASOUND, MALE  2005   internal/external radiation for prostate cancer  . TOTAL KNEE ARTHROPLASTY Left 10/12/2015   Procedure: TOTAL KNEE ARTHROPLASTY;  Surgeon: Hessie Knows, MD;  Location: [facility] ORS;  Service: Orthopedics;  Laterality: Left;  . TOTAL KNEE ARTHROPLASTY Right 05/15/2017   Procedure: TOTAL KNEE ARTHROPLASTY;  Surgeon: Hessie Knows, MD;  Location: [facility] ORS;  Service: Orthopedics;  Laterality: Right;  . UMBILICAL HERNIA REPAIR  2010     Relevant past medical, surgical, family and social history reviewed and updated as indicated. Interim medical history since our last visit reviewed. Allergies and medications reviewed and updated.  Review of Systems  Per HPI unless specifically indicated above     Objective:    BP 136/72   Pulse 81   Temp 98.4 F (36.9 C) (Oral)   Ht 5' 8.5" (1.74 m)   Wt 266 lb 12.8 oz (121 kg)   SpO2 94%   BMI  39.98 kg/m   Wt Readings from Last 3 Encounters:  12/10/17 266 lb 12.8 oz (121 kg)  09/25/17 258 lb 1.6 oz (117.1 kg)  05/15/17 260 lb (117.9 kg)    Physical Exam  Constitutional: He is oriented to person, place, and time. He appears well-developed and well-nourished. No distress.  HENT:  Head: Normocephalic and atraumatic.  Eyes: Conjunctivae and lids are normal. Right eye exhibits no discharge. Left eye exhibits no discharge. No scleral icterus.  Neck: Normal range of motion. Neck supple. No JVD present. Carotid bruit is not present.  Cardiovascular: Normal rate, regular rhythm and normal heart sounds.  Pulmonary/Chest: Effort normal and breath sounds normal. No respiratory distress.  Abdominal: Normal appearance. There is no splenomegaly or hepatomegaly.  Musculoskeletal: Normal range of motion.  Neurological: He is alert and oriented to person, place, and time.  Skin: Skin is warm, dry and intact. No rash noted. No pallor.  Psychiatric: He has a normal mood and affect. His behavior is normal. Judgment and thought content normal.     Brought in labs.  Reviewed.  They were normal except mildly low Hgb Assessment & Plan:   Problem List Items Addressed This Visit      Unprioritized   Essential hypertension, benign    Stable, continue present medications.        History of prostate cancer    Check PSA      Relevant Orders   PSA   Obesity (BMI 30-39.9)    Discuss diet and exercise      Sleep apnea    Continue with CPAP       Other Visit Diagnoses    SOB (shortness of breath) on exertion    -  Primary   Pt with multiple risk factors thougy LDL is only 83.  Will refer to cardiology for further management   Relevant Orders   Ambulatory referral to Cardiology   Annual physical exam       Relevant Orders   CBC with Differential/Platelet      Refusing immunizations until back from vacation.    Follow up plan: Return in about 6 months (around 06/09/2018).

## 2017-12-10 NOTE — Assessment & Plan Note (Signed)
Stable, continue present medications.   

## 2017-12-10 NOTE — Telephone Encounter (Signed)
Patient requesting a refill on medication for Triamcinolone 0.025 % cream to be sent to [facility] on [address].  Saw that medication was not originally prescribed by provider and that I would send message to inform nurse and provider of medication.  Patient stated that he believes he got the medication from [facility] before. Informed patient that he may be directed to facility that prescribed the medication for a refill but will still inform nurse and provider. Patient was receptive of information.   Please Advise.  Thank you

## 2017-12-10 NOTE — Assessment & Plan Note (Signed)
Continue with CPAP

## 2017-12-10 NOTE — Assessment & Plan Note (Signed)
Discuss diet and exercise

## 2017-12-10 NOTE — Assessment & Plan Note (Signed)
Check PSA. ?

## 2017-12-11 LAB — CBC WITH DIFFERENTIAL/PLATELET
Basophils Absolute: 0 10*3/uL (ref 0.0–0.2)
Basos: 1 %
EOS (ABSOLUTE): 0.5 10*3/uL — ABNORMAL HIGH (ref 0.0–0.4)
EOS: 9 %
HEMATOCRIT: 38.8 % (ref 37.5–51.0)
Hemoglobin: 13 g/dL (ref 13.0–17.7)
Immature Grans (Abs): 0 10*3/uL (ref 0.0–0.1)
Immature Granulocytes: 0 %
LYMPHS ABS: 1.4 10*3/uL (ref 0.7–3.1)
Lymphs: 24 %
MCH: 29.6 pg (ref 26.6–33.0)
MCHC: 33.5 g/dL (ref 31.5–35.7)
MCV: 88 fL (ref 79–97)
MONOS ABS: 0.6 10*3/uL (ref 0.1–0.9)
Monocytes: 10 %
Neutrophils Absolute: 3.4 10*3/uL (ref 1.4–7.0)
Neutrophils: 56 %
Platelets: 209 10*3/uL (ref 150–379)
RBC: 4.39 x10E6/uL (ref 4.14–5.80)
RDW: 14.1 % (ref 12.3–15.4)
WBC: 5.9 10*3/uL (ref 3.4–10.8)

## 2017-12-11 LAB — PSA: Prostate Specific Ag, Serum: <0.1 ng/mL (ref 0.0–4.0)

## 2017-12-11 NOTE — Progress Notes (Signed)
Notified pt by mychart

## 2018-01-19 NOTE — Progress Notes (Signed)
Cardiology Office Note  Date:  01/21/2018   ID:  Grant Diaz, DOB Jan 26, 1951, MRN [id number]  PCP:  Kathrine Haddock, NP   Chief Complaint  Patient presents with  . New Patient (Initial Visit)    Referred by Kathrine Haddock for sob on exertion. Meds reviewed verbally with patient.     HPI:  Mr Grant Diaz is a 67 yo male with PMH of  OSA on CPAP HTN CKD SOB, chronic Gastric banding Kyphosis on CT scan pulm embolism 2016, b/l CT scan: LAD territory  coronary artery calcifications.  Referred by Kathrine Haddock for SOB, history of pulmonary embolism  We discussed previous events concerning his pulmonary embolism 12/2014, SOB walking the dog Had been sitting in the office for hours CT chest 2016 extensive central, lobar, and segmental pulmonary  thromboembolism in the right pulmonary artery and arterial tree.  Large filling defects began at the bifurcation of the main pulmonary  artery and extend peripherally. nearly occlusive in the right  lower lobe pulmonary artery branch.  There is pulmonary embolism in  right upper, middle, and lower lobe branches. There may be a tiny  amount of left-sided pulmonary embolism in a lower lobe subsegmental  branch on image 81 of series 4.  no evidence of  left-sided pulmonary embolism.  RV to LV ratio is 1.2 compatible with mild right heart strain.   Reports that he has chronic knee pain Knee surg x 2 "I am in bad shape", weight has been running high, no regular exercise program Started a little bit of biking recently  CT scan 2016 showing no significant aortic atherosclerosis Images pulled up with him in the office Minimal coronary calcification  Reports having shortness of breath with activities, chronic issue, uncertain if it is worse recently Denies any significant chest pain concerning for angina He has chronic lower extremity edema uses bilateral compression hose  EKG personally reviewed by myself on todays visit Shows normal sinus  rhythm rate 78 bpm sinus arrhythmia with PVCs   PMH:   has a past medical history of Arthritis, Cancer ([place]) (2008), Chronic kidney disease, GERD (gastroesophageal reflux disease), Hypertension, Joint pain, Pancreatitis (2013), Pulmonary embolus [facility]) (March 2016), and Sleep apnea, obstructive.  PSH:    Past Surgical History:  Procedure Laterality Date  . CARPAL TUNNEL RELEASE Right 03/15/2017   Procedure: CARPAL TUNNEL RELEASE;  Surgeon: Hessie Knows, MD;  Location: [facility];  Service: Orthopedics;  Laterality: Right;  . CHOLECYSTECTOMY    . ESOPHAGOGASTRODUODENOSCOPY N/A 05/28/2013   Procedure: ESOPHAGOGASTRODUODENOSCOPY (EGD);  Surgeon: Shann Medal, MD;  Location: [facility];  Service: General;  Laterality: N/A;  . GALLBLADDER SURGERY  2003  . GANGLION CYST EXCISION Right 03/15/2017   Procedure: REMOVAL GANGLION OF WRIST;  Surgeon: Hessie Knows, MD;  Location: [facility];  Service: Orthopedics;  Laterality: Right;  . HERNIA REPAIR    . hose  2011  . KNEE ARTHROSCOPY Bilateral [id number]  . LAPAROSCOPIC GASTRIC BANDING  2009   and hernia repair  . RADIATION IMPLANT W/ ULTRASOUND, MALE  2005   internal/external radiation for prostate cancer  . TOTAL KNEE ARTHROPLASTY Left 10/12/2015   Procedure: TOTAL KNEE ARTHROPLASTY;  Surgeon: Hessie Knows, MD;  Location: [facility];  Service: Orthopedics;  Laterality: Left;  . TOTAL KNEE ARTHROPLASTY Right 05/15/2017   Procedure: TOTAL KNEE ARTHROPLASTY;  Surgeon: Hessie Knows, MD;  Location: [facility];  Service: Orthopedics;  Laterality: Right;  . UMBILICAL HERNIA REPAIR  2010    Current Outpatient  Medications  Medication Sig Dispense Refill  . acetaminophen (TYLENOL) 325 MG tablet Take 650 mg by mouth every 6 (six) hours as needed.    Marland Kitchen amLODipine (NORVASC) 10 MG tablet Take 10 mg by mouth daily.      Marland Kitchen aspirin EC 81 MG tablet Take 81 mg by mouth daily.    . celecoxib (CELEBREX) 200 MG capsule Take 200 mg by mouth daily.    . Cholecalciferol  (D3-1000) 1000 UNITS capsule Take 1,000 Units by mouth daily.    Marland Kitchen ketoconazole (NIZORAL) 2 % cream Apply 1 application topically daily as needed.    Marland Kitchen lisinopril-hydrochlorothiazide (PRINZIDE,ZESTORETIC) 20-12.5 MG tablet Take 1 tablet by mouth every morning. 90 tablet 1  . Multiple Vitamin (MULTIVITAMIN) capsule Take 1 capsule by mouth daily.      Marland Kitchen omeprazole (PRILOSEC) 20 MG capsule Take 20 mg by mouth every other day.    . triamcinolone (KENALOG) 0.025 % cream Apply 1 application topically 2 (two) times daily as needed (skin rash). 30 g 1  . Turmeric 500 MG CAPS Take 1 capsule by mouth daily.     No current facility-administered medications for this visit.      Allergies:   Adhesive [tape]   Social History:  The patient  reports that he has never smoked. He has never used smokeless tobacco. He reports that he does not drink alcohol or use drugs.   Family History:   family history includes Diabetes in his mother and son; Heart disease in his maternal grandmother and paternal grandmother; Hypertension in his brother; Parkinson's disease in his father; Stroke in his maternal grandfather and paternal grandfather.    Review of Systems: Review of Systems  Constitutional: Negative.   Respiratory: Positive for shortness of breath.   Cardiovascular: Positive for leg swelling.  Gastrointestinal: Negative.   Musculoskeletal: Negative.   Neurological: Negative.   Psychiatric/Behavioral: Negative.   All other systems reviewed and are negative.    PHYSICAL EXAM: VS:  BP 128/72 (BP Location: Right Arm, Patient Position: Sitting, Cuff Size: Normal)   Pulse 78   Ht 5\' 10"  (1.778 m)   Wt 268 lb (121.6 kg)   BMI 38.45 kg/m  , BMI Body mass index is 38.45 kg/m. GEN: Well nourished, well developed, in no acute distress , obese HEENT: normal  Neck: no JVD, carotid bruits, or masses Cardiac: RRR; no murmurs, rubs, or gallops, 1+ pitting edema with compression hose in place  Respiratory:   clear to auscultation bilaterally, normal work of breathing GI: soft, nontender, nondistended, + BS MS: no deformity or atrophy  Skin: warm and dry, no rash Neuro:  Strength and sensation are intact Psych: euthymic mood, full affect  Recent Labs: 05/17/2017: BUN 21; Creatinine, Ser 1.14; Potassium 3.6; Sodium 141 12/10/2017: Hemoglobin 13.0; Platelets 209    Lipid Panel No results found for: CHOL, HDL, LDLCALC, TRIG    Wt Readings from Last 3 Encounters:  01/21/18 268 lb (121.6 kg)  12/10/17 266 lb 12.8 oz (121 kg)  09/25/17 258 lb 1.6 oz (117.1 kg)       ASSESSMENT AND PLAN:  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  Has a trip to [place]  SOB (shortness of breath) - Plan: EKG  12-Lead, ECHOCARDIOGRAM COMPLETE Several reasons for his shortness of breath including kyphosis, obesity, deconditioning Prior history of PE 2016 Denies smoking history Echocardiogram pending Minimal coronary calcification on CT scan 3 years ago We did discuss stress testing, he would like to do one thing at a time We also discussed repeat CT scanning if symptoms get worse to rule out PE/chronic thromboembolism  (echocardiogram will help guide our decision as if pressures are low, less likely)  Obstructive sleep apnea syndrome Reports that he uses his CPAP  Essential hypertension, benign Blood pressure is well controlled on today's visit. No changes made to the medications.  Coronary artery calcification seen on CAT scan - Plan: EKG 12-Lead Minimal calcification seen on CT scan Denies smoking, diabetes No recent lipid panel available he reports it is typically well controlled   , H/O laparoscopic adjustable gastric banding Seen on CT scan  Morbid obesity We have encouraged continued exercise, careful diet management in an effort to lose weight.  Disposition:   F/U as needed We will call him with the results of his echo   Total encounter time more than 60 minutes  Greater than 50% was spent in counseling and coordination of care with the patient    Orders Placed This Encounter  Procedures  . EKG 12-Lead  . ECHOCARDIOGRAM COMPLETE     Signed, Esmond Plants, M.D., Ph.D. 01/21/2018  [place], [place]

## 2018-01-21 ENCOUNTER — Ambulatory Visit: Payer: BC Managed Care – PPO | Admitting: Cardiovascular Disease

## 2018-01-21 ENCOUNTER — Encounter: Payer: Self-pay | Admitting: Cardiovascular Disease

## 2018-01-21 VITALS — BP 128/72 | HR 78 | Ht 70.0 in | Wt 268.0 lb

## 2018-01-21 DIAGNOSIS — R0602 Shortness of breath: Secondary | ICD-10-CM | POA: Diagnosis not present

## 2018-01-21 DIAGNOSIS — I10 Essential (primary) hypertension: Secondary | ICD-10-CM

## 2018-01-21 DIAGNOSIS — I2510 Atherosclerotic heart disease of native coronary artery without angina pectoris: Secondary | ICD-10-CM | POA: Diagnosis not present

## 2018-01-21 DIAGNOSIS — I2782 Chronic pulmonary embolism: Secondary | ICD-10-CM

## 2018-01-21 DIAGNOSIS — G4733 Obstructive sleep apnea (adult) (pediatric): Secondary | ICD-10-CM | POA: Diagnosis not present

## 2018-01-21 DIAGNOSIS — I2609 Other pulmonary embolism with acute cor pulmonale: Secondary | ICD-10-CM

## 2018-01-21 DIAGNOSIS — Z9884 Bariatric surgery status: Secondary | ICD-10-CM

## 2018-01-21 NOTE — Patient Instructions (Addendum)
Medication Instructions:   No medication changes made  Take xarelto 10 mg the morning of your flight to [facility] 10 mg coming home Medication Samples have been provided to the patient.  Drug name: Xarelto       Strength: 2.5 mg        Qty: 2 bottles  LOT: [id number]  Exp.Date: 9/21  Dosing instructions: Take 4 tablets 2 hours prior to flight   Labwork:  No new labs needed  Testing/Procedures:  We will order an echocardiogram for shortness of breath, history of pulmonary embolism   Follow-Up: It was a pleasure seeing you in the office today. Please call us if you have new issues that need to be addressed before your next appt.  [phone]  Your physician wants you to follow-up in:  As needed  If you need a refill on your cardiac medications before your next appointment, please call your pharmacy.  For educational health videos Log in to : [email or web address] Or : [email or web address], password : [email or web address]

## 2018-02-14 ENCOUNTER — Encounter: Payer: Self-pay | Admitting: Unknown Physician Specialty

## 2018-02-15 ENCOUNTER — Other Ambulatory Visit: Payer: Self-pay

## 2018-02-15 ENCOUNTER — Ambulatory Visit (INDEPENDENT_AMBULATORY_CARE_PROVIDER_SITE_OTHER): Payer: BC Managed Care – PPO

## 2018-02-15 DIAGNOSIS — I2609 Other pulmonary embolism with acute cor pulmonale: Secondary | ICD-10-CM

## 2018-02-15 DIAGNOSIS — I2782 Chronic pulmonary embolism: Secondary | ICD-10-CM

## 2018-02-15 DIAGNOSIS — R0602 Shortness of breath: Secondary | ICD-10-CM

## 2018-02-15 MED ORDER — PERFLUTREN LIPID MICROSPHERE
1.0000 mL | INTRAVENOUS | Status: AC | PRN
  Administered 2018-02-15: 4 mL via INTRAVENOUS

## 2018-02-19 ENCOUNTER — Ambulatory Visit (INDEPENDENT_AMBULATORY_CARE_PROVIDER_SITE_OTHER): Payer: BC Managed Care – PPO | Admitting: Unknown Physician Specialty

## 2018-02-19 ENCOUNTER — Encounter: Payer: Self-pay | Admitting: Unknown Physician Specialty

## 2018-02-19 VITALS — BP 142/82 | HR 60 | Temp 98.2°F | Ht 68.5 in | Wt 267.7 lb

## 2018-02-19 DIAGNOSIS — R0602 Shortness of breath: Secondary | ICD-10-CM

## 2018-02-19 DIAGNOSIS — Z1211 Encounter for screening for malignant neoplasm of colon: Secondary | ICD-10-CM

## 2018-02-19 DIAGNOSIS — Z86711 Personal history of pulmonary embolism: Secondary | ICD-10-CM | POA: Diagnosis not present

## 2018-02-19 DIAGNOSIS — I10 Essential (primary) hypertension: Secondary | ICD-10-CM

## 2018-02-19 MED ORDER — LISINOPRIL-HYDROCHLOROTHIAZIDE 20-25 MG PO TABS: 1.0000 | ORAL_TABLET | Freq: Every day | ORAL | 3 refills | Status: DC

## 2018-02-19 NOTE — Assessment & Plan Note (Addendum)
DMV form filled out. BP not to goal with SBP high 130's-140's.  Increase Lisinopril/HCTZ 20/12.5 to 20/25 mg

## 2018-02-19 NOTE — Progress Notes (Addendum)
BP (!) 142/82   Pulse 60   Temp 98.2 F (36.8 C) (Oral)   Ht 5' 8.5" (1.74 m)   Wt 267 lb 11.2 oz (121.4 kg)   SpO2 100%   BMI 40.11 kg/m    Subjective:    Patient ID: Grant Diaz, male    DOB: [birth year], 67 y.o.   MRN: [id number]  HPI: Grant Diaz is a 67 y.o. male  Chief Complaint  Patient presents with  . 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  No cause was found.  Was on medications for 3 months and then off.  No coagulation studies were done according the the pateint.  Pt is concerned about further risks of a life-threatening PE.  I have reviewed his notes and f/u was done with Dr. Grayland Ormond.  Coagulopathy w/u was negative and no f/u recommended.    SOB W/U done with Dr. Rockey Situ.  This was negative.    Hypertension Using medications without difficulty Average home BPs: SBP around 138   No problems or lightheadedness No chest pain with exertion or shortness of breath No Edema    Relevant past medical, surgical, family and social history reviewed and updated as indicated. Interim medical history since our last visit reviewed. Allergies and medications reviewed and updated.  Review of Systems  Constitutional: Negative.   Respiratory: Positive for shortness of breath.   Cardiovascular: Negative.   Psychiatric/Behavioral: Negative.     Per HPI unless specifically indicated above     Objective:    BP (!) 142/82   Pulse 60   Temp 98.2 F (36.8 C) (Oral)   Ht 5' 8.5" (1.74 m)   Wt 267 lb 11.2 oz (121.4 kg)   SpO2 100%   BMI 40.11 kg/m   Wt Readings from Last 3 Encounters:  02/19/18 267 lb 11.2 oz (121.4 kg)  01/21/18 268 lb (121.6 kg)  12/10/17 266 lb 12.8 oz (121 kg)    Physical Exam  Constitutional: He is oriented to person, place, and time. He appears well-developed and well-nourished. No distress.  HENT:  Head: Normocephalic and  atraumatic.  Eyes: Conjunctivae and lids are normal. Right eye exhibits no discharge. Left eye exhibits no discharge. No scleral icterus.  Neck: Normal range of motion. Neck supple. No JVD present. Carotid bruit is not present.  Cardiovascular: Normal rate, regular rhythm and normal heart sounds.  Pulmonary/Chest: Effort normal and breath sounds normal. No respiratory distress.  Abdominal: Normal appearance. There is no splenomegaly or hepatomegaly.  Musculoskeletal: Normal range of motion.  Neurological: He is alert and oriented to person, place, and time.  Skin: Skin is warm, dry and intact. No rash noted. No pallor.  Psychiatric: He has a normal mood and affect. His behavior is normal. Judgment and thought content normal.    Results for orders placed or performed in visit on 12/10/17  PSA  Result Value Ref Range   Prostate Specific Ag, Serum <0.1 0.0 - 4.0 ng/mL  CBC with Differential/Platelet  Result Value Ref Range   WBC 5.9 3.4 - 10.8 x10E3/uL   RBC 4.39 4.14 - 5.80 x10E6/uL   Hemoglobin 13.0 13.0 - 17.7 g/dL   Hematocrit 38.8 37.5 - 51.0 %   MCV 88 79 - 97 fL   MCH 29.6 26.6 - 33.0 pg   MCHC 33.5 31.5 - 35.7 g/dL   RDW 14.1  12.3 - 15.4 %   Platelets 209 150 - 379 x10E3/uL   Neutrophils 56 Not Estab. %   Lymphs 24 Not Estab. %   Monocytes 10 Not Estab. %   Eos 9 Not Estab. %   Basos 1 Not Estab. %   Neutrophils Absolute 3.4 1.4 - 7.0 x10E3/uL   Lymphocytes Absolute 1.4 0.7 - 3.1 x10E3/uL   Monocytes Absolute 0.6 0.1 - 0.9 x10E3/uL   EOS (ABSOLUTE) 0.5 (H) 0.0 - 0.4 x10E3/uL   Basophils Absolute 0.0 0.0 - 0.2 x10E3/uL   Immature Granulocytes 0 Not Estab. %   Immature Grans (Abs) 0.0 0.0 - 0.1 x10E3/uL      Assessment & Plan:   Problem List Items Addressed This Visit      Unprioritized   Essential hypertension, benign    DMV form filled out. BP not to goal with SBP high 130's-140's.  Increase Lisinopril/HCTZ 20/12.5 to 20/25 mg      Relevant Medications    lisinopril-hydrochlorothiazide (PRINZIDE,ZESTORETIC) 20-25 MG tablet   History of pulmonary embolism   Shortness of breath    Suspect due to deconditioning.  Discussed gradual conditioning with diet and exercise       Other Visit Diagnoses    Colon cancer screening    -  Primary   Shared decision making.  Elects to have cologuard.  Pt aware a positive  cologuard will result in a diagnostic colonoscopy.     Relevant Orders   Cologuard       Follow up plan: Return for August  if he keeps an eye on his BP.

## 2018-02-19 NOTE — Assessment & Plan Note (Signed)
Suspect due to deconditioning.  Discussed gradual conditioning with diet and exercise

## 2018-02-28 ENCOUNTER — Telehealth: Payer: Self-pay | Admitting: Family Medicine

## 2018-02-28 DIAGNOSIS — R310 Gross hematuria: Secondary | ICD-10-CM

## 2018-02-28 NOTE — Telephone Encounter (Signed)
Copied from [place] [id number]. Topic: Referral - Request >> Feb 28, 2018  3:43 PM Synthia Innocent wrote: Reason for CRM: Requesting referral to urologist due to blood in urine, Dr [facility]  >> Feb 28, 2018  5:03 PM Don Perking M wrote: Please advise.

## 2018-02-28 NOTE — Telephone Encounter (Signed)
Referral placed.

## 2018-03-13 ENCOUNTER — Telehealth: Payer: Self-pay

## 2018-03-13 DIAGNOSIS — R195 Other fecal abnormalities: Secondary | ICD-10-CM

## 2018-03-13 LAB — COLOGUARD: Cologuard: POSITIVE

## 2018-03-13 NOTE — Telephone Encounter (Signed)
Patient with a positive cologuard result. Updated health maintenance, please advise.

## 2018-03-13 NOTE — Telephone Encounter (Signed)
Please call pt and notify positive cologuard. Most positive cologuads do not result in a diagnosis of colon cancer but needs to have a colonoscopy.  Referral made

## 2018-03-13 NOTE — Telephone Encounter (Signed)
Called and left patient a VM asking for him to please return my call. Ok for [facility] to let patient know result, let patient know what Malachy Mood said, and that GI will be contacting him to schedule the colonoscopy.

## 2018-03-15 NOTE — Telephone Encounter (Signed)
Called and left patient a VM asking for him to please return my call to discuss result.

## 2018-03-19 ENCOUNTER — Other Ambulatory Visit: Payer: Self-pay

## 2018-03-19 ENCOUNTER — Telehealth: Payer: Self-pay | Admitting: Cardiovascular Disease

## 2018-03-19 DIAGNOSIS — R195 Other fecal abnormalities: Secondary | ICD-10-CM

## 2018-03-19 NOTE — Telephone Encounter (Signed)
Called and spoke to patient. He states that GI has already contacted him and has appointment for colonoscopy on 04/02/18.

## 2018-03-19 NOTE — Telephone Encounter (Signed)
Patient calling to discuss echo results. 

## 2018-03-19 NOTE — Telephone Encounter (Signed)
Spoke with patient and reviewed results were good and he verbalized understanding with no further questions or needs at this time.

## 2018-03-28 ENCOUNTER — Telehealth: Payer: Self-pay | Admitting: Unknown Physician Specialty

## 2018-03-28 ENCOUNTER — Ambulatory Visit: Payer: BC Managed Care – PPO

## 2018-03-28 MED ORDER — AMLODIPINE BESYLATE 10 MG PO TABS: 10.0000 mg | ORAL_TABLET | Freq: Every day | ORAL | 1 refills | Status: DC

## 2018-03-28 NOTE — Telephone Encounter (Signed)
Pt stated that he needs a refill on his amlodipine 10mg  tab sent to [facility] [place].

## 2018-04-01 ENCOUNTER — Encounter: Payer: Self-pay | Admitting: *Deleted

## 2018-04-02 ENCOUNTER — Ambulatory Visit: Payer: BC Managed Care – PPO | Admitting: Anesthesiology

## 2018-04-02 ENCOUNTER — Encounter
Admission: RE | Disposition: A | Discharge status: Home / Self Care | Payer: Self-pay | Source: Ambulatory Visit | Attending: Gastroenterology

## 2018-04-02 ENCOUNTER — Ambulatory Visit
Admission: RE | Admit: 2018-04-02 | Discharge: 2018-04-02 | Disposition: A | Discharge status: Home / Self Care | Payer: BC Managed Care – PPO | Source: Ambulatory Visit | Attending: Gastroenterology | Admitting: Gastroenterology

## 2018-04-02 ENCOUNTER — Encounter: Payer: Self-pay | Admitting: *Deleted

## 2018-04-02 DIAGNOSIS — D124 Benign neoplasm of descending colon: Secondary | ICD-10-CM | POA: Diagnosis not present

## 2018-04-02 DIAGNOSIS — Z8249 Family history of ischemic heart disease and other diseases of the circulatory system: Secondary | ICD-10-CM | POA: Insufficient documentation

## 2018-04-02 DIAGNOSIS — Z86711 Personal history of pulmonary embolism: Secondary | ICD-10-CM | POA: Diagnosis not present

## 2018-04-02 DIAGNOSIS — Z96653 Presence of artificial knee joint, bilateral: Secondary | ICD-10-CM | POA: Insufficient documentation

## 2018-04-02 DIAGNOSIS — Z9989 Dependence on other enabling machines and devices: Secondary | ICD-10-CM | POA: Diagnosis not present

## 2018-04-02 DIAGNOSIS — D125 Benign neoplasm of sigmoid colon: Secondary | ICD-10-CM | POA: Diagnosis not present

## 2018-04-02 DIAGNOSIS — G4733 Obstructive sleep apnea (adult) (pediatric): Secondary | ICD-10-CM | POA: Diagnosis not present

## 2018-04-02 DIAGNOSIS — Z923 Personal history of irradiation: Secondary | ICD-10-CM | POA: Diagnosis not present

## 2018-04-02 DIAGNOSIS — K635 Polyp of colon: Secondary | ICD-10-CM | POA: Insufficient documentation

## 2018-04-02 DIAGNOSIS — K219 Gastro-esophageal reflux disease without esophagitis: Secondary | ICD-10-CM | POA: Diagnosis not present

## 2018-04-02 DIAGNOSIS — Z8546 Personal history of malignant neoplasm of prostate: Secondary | ICD-10-CM | POA: Diagnosis not present

## 2018-04-02 DIAGNOSIS — I10 Essential (primary) hypertension: Secondary | ICD-10-CM | POA: Insufficient documentation

## 2018-04-02 DIAGNOSIS — M1990 Unspecified osteoarthritis, unspecified site: Secondary | ICD-10-CM | POA: Insufficient documentation

## 2018-04-02 DIAGNOSIS — Z7982 Long term (current) use of aspirin: Secondary | ICD-10-CM | POA: Insufficient documentation

## 2018-04-02 DIAGNOSIS — Z79899 Other long term (current) drug therapy: Secondary | ICD-10-CM | POA: Insufficient documentation

## 2018-04-02 DIAGNOSIS — R195 Other fecal abnormalities: Secondary | ICD-10-CM | POA: Diagnosis not present

## 2018-04-02 DIAGNOSIS — K5730 Diverticulosis of large intestine without perforation or abscess without bleeding: Secondary | ICD-10-CM | POA: Diagnosis not present

## 2018-04-02 DIAGNOSIS — Z888 Allergy status to other drugs, medicaments and biological substances status: Secondary | ICD-10-CM | POA: Insufficient documentation

## 2018-04-02 HISTORY — PX: COLONOSCOPY WITH PROPOFOL: SHX5780

## 2018-04-02 SURGERY — COLONOSCOPY WITH PROPOFOL
Anesthesia: General

## 2018-04-02 MED ORDER — PROPOFOL 500 MG/50ML IV EMUL
INTRAVENOUS | Status: AC
  Filled 2018-04-02: qty 50

## 2018-04-02 MED ORDER — PROPOFOL 10 MG/ML IV BOLUS
INTRAVENOUS | Status: DC | PRN
  Administered 2018-04-02 (×2): 50 mg via INTRAVENOUS

## 2018-04-02 MED ORDER — SODIUM CHLORIDE 0.9 % IV SOLN
INTRAVENOUS | Status: DC
  Administered 2018-04-02: 1000 mL via INTRAVENOUS

## 2018-04-02 MED ORDER — SODIUM CHLORIDE 0.9 % IV SOLN
INTRAVENOUS | Status: DC | PRN
  Administered 2018-04-02: 09:00:00 via INTRAVENOUS

## 2018-04-02 MED ORDER — PROPOFOL 500 MG/50ML IV EMUL
INTRAVENOUS | Status: DC | PRN
  Administered 2018-04-02: 80 ug/kg/min via INTRAVENOUS

## 2018-04-02 NOTE — Transfer of Care (Signed)
Immediate Anesthesia Transfer of Care Note  Patient: Grant Diaz  Procedure(s) Performed: COLONOSCOPY WITH PROPOFOL (N/A )  Patient Location: PACU  Anesthesia Type:General  Level of Consciousness: awake  Airway & Oxygen Therapy: Patient Spontanous Breathing  Post-op Assessment: Report given to RN  Post vital signs: stable  Last Vitals:  Vitals Value Taken Time  BP 120/84 04/02/2018  9:33 AM  Temp    Pulse 77 04/02/2018  9:33 AM  Resp 13 04/02/2018  9:33 AM  SpO2 100 % 04/02/2018  9:33 AM  Vitals shown include unvalidated device data.  Last Pain:  Vitals:   04/02/18 0849  TempSrc: Tympanic         Complications: No apparent anesthesia complications

## 2018-04-02 NOTE — Anesthesia Post-op Follow-up Note (Signed)
Anesthesia QCDR form completed.        

## 2018-04-02 NOTE — Anesthesia Preprocedure Evaluation (Signed)
Anesthesia Evaluation  Patient identified by MRN, date of birth, ID band Patient awake    Reviewed: Allergy & Precautions, H&P , NPO status , Patient's Chart, lab work & pertinent test results, reviewed documented beta blocker date and time   Airway Mallampati: II   Neck ROM: full    Dental  (+) Poor Dentition   Pulmonary neg pulmonary ROS, shortness of breath, sleep apnea ,    Pulmonary exam normal        Cardiovascular Exercise Tolerance: Good hypertension, On Medications negative cardio ROS Normal cardiovascular exam Rhythm:regular Rate:Normal     Neuro/Psych negative neurological ROS  negative psych ROS   GI/Hepatic negative GI ROS, Neg liver ROS, GERD  Medicated,  Endo/Other  negative endocrine ROS  Renal/GU Renal diseasenegative Renal ROS  negative genitourinary   Musculoskeletal   Abdominal   Peds  Hematology negative hematology ROS (+)   Anesthesia Other Findings Past Medical History: No date: Arthritis 2008: Cancer ([place])     Comment:  Prostate-radiation No date: Chronic kidney disease     Comment:  kidney stones  remote history No date: GERD (gastroesophageal reflux disease) No date: Hypertension No date: Joint pain 2013: Pancreatitis     Comment:  [facility] March 2016: Pulmonary embolus ([place])     Comment:  Bilateral pulmonary embolus, [facility] No date: Sleep apnea, obstructive     Comment:  cpap Past Surgical History: 03/15/2017: CARPAL TUNNEL RELEASE; Right     Comment:  Procedure: CARPAL TUNNEL RELEASE;  Surgeon: Hessie Knows, MD;  Location: [facility];  Service: Orthopedics;               Laterality: Right; No date: CHOLECYSTECTOMY 05/28/2013: ESOPHAGOGASTRODUODENOSCOPY; N/A     Comment:  Procedure: ESOPHAGOGASTRODUODENOSCOPY (EGD);  Surgeon:               Shann Medal, MD;  Location: [facility];  Service:               General;  Laterality: N/A; 2003: GALLBLADDER  SURGERY 03/15/2017: GANGLION CYST EXCISION; Right     Comment:  Procedure: REMOVAL GANGLION OF WRIST;  Surgeon: Hessie Knows, MD;  Location: [facility];  Service: Orthopedics;               Laterality: Right; No date: HERNIA REPAIR 2011: hose 1610,9604: KNEE ARTHROSCOPY; Bilateral 2009: LAPAROSCOPIC GASTRIC BANDING     Comment:  and hernia repair 2005: RADIATION IMPLANT W/ ULTRASOUND, MALE     Comment:  internal/external radiation for prostate cancer 10/12/2015: TOTAL KNEE ARTHROPLASTY; Left     Comment:  Procedure: TOTAL KNEE ARTHROPLASTY;  Surgeon: Hessie Knows, MD;  Location: [facility];  Service: Orthopedics;                Laterality: Left; 05/15/2017: TOTAL KNEE ARTHROPLASTY; Right     Comment:  Procedure: TOTAL KNEE ARTHROPLASTY;  Surgeon: Hessie Knows, MD;  Location: [facility];  Service: Orthopedics;               Laterality: Right; 5409: UMBILICAL HERNIA REPAIR BMI    Body Mass Index:  33.23 kg/m     Reproductive/Obstetrics negative OB ROS  Anesthesia Physical Anesthesia Plan  ASA: III  Anesthesia Plan: General   Post-op Pain Management:    Induction:   PONV Risk Score and Plan:   Airway Management Planned:   Additional Equipment:   Intra-op Plan:   Post-operative Plan:   Informed Consent: I have reviewed the patients History and Physical, chart, labs and discussed the procedure including the risks, benefits and alternatives for the proposed anesthesia with the patient or authorized representative who has indicated his/her understanding and acceptance.   Dental Advisory Given  Plan Discussed with: CRNA  Anesthesia Plan Comments:         Anesthesia Quick Evaluation

## 2018-04-02 NOTE — Op Note (Signed)
[facility] Patient Name: Grant Diaz Procedure Date: 04/02/2018 8:52 AM MRN: [id number] Account #: [id number] Date of Birth: 1951/07/09 Admit Type: Outpatient Age: 67 Room: [facility] ENDO ROOM 4 Gender: Male Note Status: Finalized Procedure:            Colonoscopy Indications:          Positive Cologuard test Providers:            Lucilla Lame MD, MD Referring MD:         Kathrine Haddock (Referring MD) Medicines:            Propofol per Anesthesia Complications:        No immediate complications. Procedure:            Pre-Anesthesia Assessment:                       - Prior to the procedure, a History and Physical was                        performed, and patient medications and allergies were                        reviewed. The patient's tolerance of previous                        anesthesia was also reviewed. The risks and benefits of                        the procedure and the sedation options and risks were                        discussed with the patient. All questions were                        answered, and informed consent was obtained. Prior                        Anticoagulants: The patient has taken no previous                        anticoagulant or antiplatelet agents. ASA Grade                        Assessment: II - A patient with mild systemic disease.                        After reviewing the risks and benefits, the patient was                        deemed in satisfactory condition to undergo the                        procedure.                       After obtaining informed consent, the colonoscope was                        passed under direct vision. Throughout the procedure,  the patient's blood pressure, pulse, and oxygen                        saturations were monitored continuously. The                        Colonoscope was introduced through the anus and                        advanced to the the  cecum, identified by appendiceal                        orifice and ileocecal valve. The colonoscopy was                        performed without difficulty. The patient tolerated the                        procedure well. The quality of the bowel preparation                        was excellent. Findings:      The perianal and digital rectal examinations were normal.      A few small-mouthed diverticula were found in the sigmoid colon.      A 3 mm polyp was found in the descending colon. The polyp was sessile.       The polyp was removed with a cold biopsy forceps. Resection and       retrieval were complete.      A 3 mm polyp was found in the sigmoid colon. The polyp was sessile. The       polyp was removed with a cold biopsy forceps. Resection and retrieval       were complete. Impression:           - Diverticulosis in the sigmoid colon.                       - One 3 mm polyp in the descending colon, removed with                        a cold biopsy forceps. Resected and retrieved.                       - One 3 mm polyp in the sigmoid colon, removed with a                        cold biopsy forceps. Resected and retrieved. Recommendation:       - Discharge patient to home.                       - Resume previous diet.                       - Continue present medications.                       - Await pathology results.                       - Repeat colonoscopy in 5 years if polyp adenoma and 10  years if hyperplastic Procedure Code(s):    --- Professional ---                       [phone], Colonoscopy, flexible; with biopsy, single or                        multiple Diagnosis Code(s):    --- Professional ---                       R19.5, Other fecal abnormalities                       D12.4, Benign neoplasm of descending colon                       D12.5, Benign neoplasm of sigmoid colon CPT copyright 2017 American Medical Association. All rights reserved. The  codes documented in this report are preliminary and upon coder review may  be revised to meet current compliance requirements. Lucilla Lame MD, MD 04/02/2018 9:33:45 AM This report has been signed electronically. Number of Addenda: 0 Note Initiated On: 04/02/2018 8:52 AM Scope Withdrawal Time: 0 hours 6 minutes 57 seconds  Total Procedure Duration: 0 hours 11 minutes 45 seconds       [facility]

## 2018-04-02 NOTE — H&P (Signed)
Lucilla Lame, MD [facility] [address] [place], [place] [address] Phone:[phone] Fax : [phone]  Primary Care Physician:  Kathrine Haddock, NP Primary Gastroenterologist:  Dr. Allen Norris  Pre-Procedure History & Physical: HPI:  Grant Diaz is a 67 y.o. male is here for an colonoscopy.   Past Medical History:  Diagnosis Date  . Arthritis   . 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 Chronic kidney disease    kidney stones  remote history  . GERD (gastroesophageal reflux disease)   . Hypertension   . Joint pain   . Pancreatitis 2013   [facility]  . Pulmonary embolus [facility]) March 2016   Bilateral pulmonary embolus, [facility]  . Sleep apnea, obstructive    cpap    Past Surgical History:  Procedure Laterality Date  . CARPAL TUNNEL RELEASE Right 03/15/2017   Procedure: CARPAL TUNNEL RELEASE;  Surgeon: Hessie Knows, MD;  Location: [facility] ORS;  Service: Orthopedics;  Laterality: Right;  . CHOLECYSTECTOMY    . ESOPHAGOGASTRODUODENOSCOPY N/A 05/28/2013   Procedure: ESOPHAGOGASTRODUODENOSCOPY (EGD);  Surgeon: Shann Medal, MD;  Location: [facility];  Service: General;  Laterality: N/A;  . GALLBLADDER SURGERY  2003  . GANGLION CYST EXCISION Right 03/15/2017   Procedure: REMOVAL GANGLION OF WRIST;  Surgeon: Hessie Knows, MD;  Location: [facility] ORS;  Service: Orthopedics;  Laterality: Right;  . HERNIA REPAIR    . hose  2011  . KNEE ARTHROSCOPY Bilateral [id number]  . LAPAROSCOPIC GASTRIC BANDING  2009   and hernia repair  . RADIATION IMPLANT W/ ULTRASOUND, MALE  2005   internal/external radiation for prostate cancer  . TOTAL KNEE ARTHROPLASTY Left 10/12/2015   Procedure: TOTAL KNEE ARTHROPLASTY;  Surgeon: Hessie Knows, MD;  Location: [facility] ORS;  Service: Orthopedics;  Laterality: Left;  . TOTAL KNEE ARTHROPLASTY Right 05/15/2017   Procedure: TOTAL KNEE ARTHROPLASTY;  Surgeon: Hessie Knows, MD;  Location: [facility] ORS;  Service: Orthopedics;  Laterality: Right;  . UMBILICAL HERNIA REPAIR   2010    Prior to Admission medications   Medication Sig Start Date End Date Taking? Authorizing Provider  acetaminophen (TYLENOL) 325 MG tablet Take 650 mg by mouth every 6 (six) hours as needed.    [provider]  amLODipine (NORVASC) 10 MG tablet Take 1 tablet (10 mg total) by mouth daily. 03/28/18   Kathrine Haddock, NP  aspirin EC 81 MG tablet Take 81 mg by mouth daily.    [provider]  celecoxib (CELEBREX) 200 MG capsule Take 200 mg by mouth daily.    [provider]  Cholecalciferol (D3-1000) 1000 UNITS capsule Take 1,000 Units by mouth daily.    [provider]  ketoconazole (NIZORAL) 2 % cream Apply 1 application topically daily as needed. 11/22/17   [provider]  lisinopril-hydrochlorothiazide (PRINZIDE,ZESTORETIC) 20-25 MG tablet Take 1 tablet by mouth daily. 02/19/18   Kathrine Haddock, NP  Multiple Vitamin (MULTIVITAMIN) capsule Take 1 capsule by mouth daily.      [provider]  omeprazole (PRILOSEC) 20 MG capsule Take 20 mg by mouth every other day.    [provider]  triamcinolone (KENALOG) 0.025 % cream Apply 1 application topically 2 (two) times daily as needed (skin rash). 12/10/17   Kathrine Haddock, NP  Turmeric 500 MG CAPS Take 1 capsule by mouth daily.    [provider]    Allergies as of 03/19/2018 - Review Complete 02/19/2018  Allergen Reaction Noted  . Adhesive [tape] Rash 09/29/2015    Family History  Problem Relation Age of Onset  . Diabetes Mother   . Parkinson's disease Father   . Hypertension Brother   . Diabetes Son        type 1  . Heart disease Maternal Grandmother        CHF  . Stroke Maternal Grandfather   . Heart disease Paternal Grandmother        CHF  . Stroke Paternal Grandfather     Social History   Socioeconomic History  . Marital status: Married    Spouse name: Not on file  . Number of children: Not on file  . Years of education: Not on file  . Highest  education level: Not on file  Occupational History  . Not on file  Social Needs  . Financial resource strain: Not on file  . Food insecurity:    Worry: Not on file    Inability: Not on file  . Transportation needs:    Medical: Not on file    Non-medical: Not on file  Tobacco Use  . Smoking status: Never Smoker  . Smokeless tobacco: Never Used  Substance and Sexual Activity  . Alcohol use: No  . Drug use: No  . Sexual activity: Never  Lifestyle  . Physical activity:    Days per week: Not on file    Minutes per session: Not on file  . Stress: Not on file  Relationships  . Social connections:    Talks on phone: Not on file    Gets together: Not on file    Attends religious service: Not on file    Active member of club or organization: Not on file    Attends meetings of clubs or organizations: Not on file    Relationship status: Not on file  . Intimate partner violence:    Fear of current or ex partner: Not on file    Emotionally abused: Not on file    Physically abused: Not on file    Forced sexual activity: Not on file  Other Topics Concern  . Not on file  Social History Narrative  . Not on file    Review of Systems: See HPI, otherwise negative ROS  Physical Exam: BP (!) 159/93   Pulse 83   Temp (!) 97.1 F (36.2 C) (Tympanic)   Resp 20   Ht 5\' 9"  (1.753 m)   Wt 225 lb (102.1 kg)   SpO2 100%   BMI 33.23 kg/m  General:   Alert,  pleasant and cooperative in NAD Head:  Normocephalic and atraumatic. Neck:  Supple; no masses or thyromegaly. Lungs:  Clear throughout to auscultation.    Heart:  Regular rate and rhythm. Abdomen:  Soft, nontender and nondistended. Normal bowel sounds, without guarding, and without rebound.   Neurologic:  Alert and  oriented x4;  grossly normal neurologically.  Impression/Plan: Grant Diaz is here for an colonoscopy to be performed for positive cologard  Risks, benefits, limitations, and alternatives regarding  colonoscopy  have been reviewed with the patient.  Questions have been answered.  All parties agreeable.   Lucilla Lame, MD  04/02/2018, 9:10 AM

## 2018-04-03 ENCOUNTER — Encounter: Payer: Self-pay | Admitting: Gastroenterology

## 2018-04-03 LAB — SURGICAL PATHOLOGY

## 2018-04-07 NOTE — Anesthesia Postprocedure Evaluation (Signed)
Anesthesia Post Note  Patient: Grant Diaz  Procedure(s) Performed: COLONOSCOPY WITH PROPOFOL (N/A )  Patient location during evaluation: PACU Anesthesia Type: General Level of consciousness: awake and alert Pain management: pain level controlled Vital Signs Assessment: post-procedure vital signs reviewed and stable Respiratory status: spontaneous breathing, nonlabored ventilation, respiratory function stable and patient connected to nasal cannula oxygen Cardiovascular status: blood pressure returned to baseline and stable Postop Assessment: no apparent nausea or vomiting Anesthetic complications: no     Last Vitals:  Vitals:   04/02/18 0849 04/02/18 0932  BP: (!) 159/93 120/84  Pulse: 83   Resp: 20 (!) 23  Temp: (!) 36.2 C (!) 35.9 C  SpO2: 100% 100%    Last Pain:  Vitals:   04/02/18 1003  TempSrc:   PainSc: 0-No pain                 Molli Barrows

## 2018-04-08 ENCOUNTER — Encounter: Payer: Self-pay | Admitting: Gastroenterology

## 2018-04-08 ENCOUNTER — Encounter: Payer: Self-pay | Admitting: Urology

## 2018-04-08 ENCOUNTER — Ambulatory Visit: Payer: BC Managed Care – PPO | Admitting: Urology

## 2018-04-08 VITALS — BP 131/85 | HR 121 | Ht 70.0 in | Wt 266.8 lb

## 2018-04-08 DIAGNOSIS — R310 Gross hematuria: Secondary | ICD-10-CM | POA: Diagnosis not present

## 2018-04-08 DIAGNOSIS — Z8546 Personal history of malignant neoplasm of prostate: Secondary | ICD-10-CM | POA: Diagnosis not present

## 2018-04-08 NOTE — Progress Notes (Signed)
04/08/2018 3:29 PM   Grant Diaz 1950-11-01 [id number]  Referring provider: Kathrine Haddock, NP [address], [place] [address]  Chief Complaint  Patient presents with  . Hematuria    HPI: Grant Diaz is a 67 year old male referred for evaluation of gross hematuria.  Back in the late April 2019 while visiting his son in [place] he had 4 days of intermittent gross hematuria.  The urine was described as bright red without clots.  He had no frequency, urgency or dysuria.  He had no flank, abdominal, pelvic or scrotal pain.  He has had no recurrent episodes.  He does have a history of prostate cancer treated with palladium brachytherapy/IMRT in 2008.  His last PSA in February 2019 was undetectable at <0.1. He has no previous history of tobacco use.   PMH: Past Medical History:  Diagnosis Date  . Arthritis   . 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 Chronic kidney disease    kidney stones  remote history  . GERD (gastroesophageal reflux disease)   . History of DVT (deep vein thrombosis)   . Hypertension   . Joint pain   . Pancreatitis 2013   [facility]  . Prostate cancer ([place])   . Pulmonary embolus [facility]) March 2016   Bilateral pulmonary embolus, [facility]  . Sleep apnea, obstructive    cpap    Surgical History: Past Surgical History:  Procedure Laterality Date  . CARPAL TUNNEL RELEASE Right 03/15/2017   Procedure: CARPAL TUNNEL RELEASE;  Surgeon: Hessie Knows, MD;  Location: [facility] ORS;  Service: Orthopedics;  Laterality: Right;  . CHOLECYSTECTOMY    . COLONOSCOPY WITH PROPOFOL N/A 04/02/2018   Procedure: COLONOSCOPY WITH PROPOFOL;  Surgeon: Lucilla Lame, MD;  Location: [facility] ENDOSCOPY;  Service: Endoscopy;  Laterality: N/A;  . ESOPHAGOGASTRODUODENOSCOPY N/A 05/28/2013   Procedure: ESOPHAGOGASTRODUODENOSCOPY (EGD);  Surgeon: Shann Medal, MD;  Location: [facility];  Service: General;  Laterality: N/A;  . GALLBLADDER SURGERY  2003  . GANGLION CYST EXCISION Right 03/15/2017   Procedure: REMOVAL GANGLION OF WRIST;  Surgeon: Hessie Knows, MD;  Location: [facility] ORS;  Service: Orthopedics;  Laterality: Right;  . HERNIA REPAIR    . hose  2011  . KNEE ARTHROSCOPY Bilateral [id number]  . LAPAROSCOPIC GASTRIC BANDING  2009   and hernia repair  . RADIATION IMPLANT W/ ULTRASOUND, MALE  2005   internal/external radiation for prostate cancer  . TOTAL KNEE ARTHROPLASTY Left 10/12/2015   Procedure: TOTAL KNEE ARTHROPLASTY;  Surgeon: Hessie Knows, MD;  Location: [facility] ORS;  Service: Orthopedics;  Laterality: Left;  . TOTAL KNEE ARTHROPLASTY Right 05/15/2017   Procedure: TOTAL KNEE ARTHROPLASTY;  Surgeon: Hessie Knows, MD;  Location: [facility] ORS;  Service: Orthopedics;  Laterality: Right;  . UMBILICAL HERNIA REPAIR  2010    Home Medications:  Allergies as of 04/08/2018      Reactions   Adhesive [tape] Rash   "whelps", latex adhesive tape only, paper tape is ok       Medication List        Accurate as of 04/08/18  3:29 PM. Always use your most recent med list.          acetaminophen 325 MG tablet Commonly known as:  TYLENOL Take 650 mg by mouth every 6 (six) hours as needed.   amLODipine 10 MG tablet Commonly known as:  NORVASC Take 1 tablet (10 mg total) by mouth daily.   aspirin EC 81 MG tablet Take 81 mg by mouth daily.   celecoxib  200 MG capsule Commonly known as:  CELEBREX Take 200 mg by mouth daily.   D3-1000 1000 units capsule Generic drug:  Cholecalciferol Take 1,000 Units by mouth daily.   ketoconazole 2 % cream Commonly known as:  NIZORAL Apply 1 application topically daily as needed.   lisinopril-hydrochlorothiazide 20-25 MG tablet Commonly known as:  PRINZIDE,ZESTORETIC Take 1 tablet by mouth daily.   multivitamin capsule Take 1 capsule by mouth daily.   omeprazole 20 MG capsule Commonly known as:  PRILOSEC Take 20 mg by mouth every other day.   triamcinolone 0.025 % cream Commonly known as:  KENALOG Apply 1 application topically 2  (two) times daily as needed (skin rash).   Turmeric 500 MG Caps Take 1 capsule by mouth daily.       Allergies:  Allergies  Allergen Reactions  . Adhesive [Tape] Rash    "whelps", latex adhesive tape only, paper tape is ok     Family History: Family History  Problem Relation Age of Onset  . Diabetes Mother   . Parkinson's disease Father   . Hypertension Brother   . Diabetes Son        type 1  . Heart disease Maternal Grandmother        CHF  . Stroke Maternal Grandfather   . Heart disease Paternal Grandmother        CHF  . Stroke Paternal Grandfather     Social History:  reports that he has never smoked. He has never used smokeless tobacco. He reports that he does not drink alcohol or use drugs.  ROS: UROLOGY Frequent Urination?: No Hard to postpone urination?: No Burning/pain with urination?: No Get up at night to urinate?: Yes Leakage of urine?: No Urine stream starts and stops?: No Trouble starting stream?: No Do you have to strain to urinate?: No Blood in urine?: Yes Urinary tract infection?: No Sexually transmitted disease?: No Injury to kidneys or bladder?: No Painful intercourse?: No Weak stream?: No Erection problems?: No Penile pain?: No  Gastrointestinal Nausea?: No Vomiting?: No Indigestion/heartburn?: No Diarrhea?: No Constipation?: No  Constitutional Fever: No Night sweats?: No Weight loss?: No Fatigue?: No  Skin Skin rash/lesions?: No Itching?: No  Eyes Blurred vision?: No Double vision?: No  Ears/Nose/Throat Sore throat?: No Sinus problems?: No  Hematologic/Lymphatic Swollen glands?: No Easy bruising?: No  Cardiovascular Leg swelling?: No Chest pain?: No  Respiratory Cough?: No Shortness of breath?: No  Endocrine Excessive thirst?: No  Musculoskeletal Back pain?: No Joint pain?: No  Neurological Headaches?: No Dizziness?: No  Psychologic Depression?: No Anxiety?: No  Physical Exam: BP 131/85 (BP  Location: Left Arm, Patient Position: Sitting, Cuff Size: Large)   Pulse (!) 121   Ht 5\' 10"  (1.778 m)   Wt 266 lb 12.8 oz (121 kg)   SpO2 99%   BMI 38.28 kg/m   Constitutional:  Alert and oriented, No acute distress. HEENT: [address] AT, moist mucus membranes.  Trachea midline, no masses. Cardiovascular: No clubbing, cyanosis, or edema. Respiratory: Normal respiratory effort, no increased work of breathing. GI: Abdomen is soft, nontender, nondistended, no abdominal masses GU: No CVA tenderness Lymph: No cervical or inguinal lymphadenopathy. Skin: No rashes, bruises or suspicious lesions. Neurologic: Grossly intact, no focal deficits, moving all 4 extremities. Psychiatric: Normal mood and affect.  Laboratory Data:  Urinalysis Dipstick/microscopy negative   Assessment & Plan:   67 year old male with an episode of gross hematuria which has resolved.  Urinalysis today was unremarkable.  He does have a history  of pelvic irradiation and does have an increased risk of urothelial carcinoma.  I recommended a full hematuria evaluation to include CT urogram and cystoscopy.  He was in agreement and desires to proceed.    Abbie Sons, [place] [address], [place], [place] [address] [phone]

## 2018-04-09 DIAGNOSIS — M545 Low back pain, unspecified: Secondary | ICD-10-CM | POA: Insufficient documentation

## 2018-04-09 DIAGNOSIS — K429 Umbilical hernia without obstruction or gangrene: Secondary | ICD-10-CM | POA: Insufficient documentation

## 2018-04-09 DIAGNOSIS — I10 Essential (primary) hypertension: Secondary | ICD-10-CM | POA: Insufficient documentation

## 2018-04-09 DIAGNOSIS — K449 Diaphragmatic hernia without obstruction or gangrene: Secondary | ICD-10-CM | POA: Insufficient documentation

## 2018-04-09 DIAGNOSIS — C61 Malignant neoplasm of prostate: Secondary | ICD-10-CM | POA: Insufficient documentation

## 2018-04-09 LAB — MICROSCOPIC EXAMINATION: WBC, UA: NONE SEEN /hpf (ref 0–5)

## 2018-04-09 LAB — URINALYSIS, COMPLETE
Bilirubin, UA: NEGATIVE
GLUCOSE, UA: NEGATIVE
Ketones, UA: NEGATIVE
Leukocytes, UA: NEGATIVE
NITRITE UA: NEGATIVE
Protein, UA: NEGATIVE
RBC, UA: NEGATIVE
Specific Gravity, UA: 1.015 (ref 1.005–1.030)
Urobilinogen, Ur: 1 mg/dL (ref 0.2–1.0)
pH, UA: 7 (ref 5.0–7.5)

## 2018-04-23 ENCOUNTER — Ambulatory Visit
Admission: RE | Admit: 2018-04-23 | Discharge: 2018-04-23 | Disposition: A | Discharge status: Home / Self Care | Payer: BC Managed Care – PPO | Source: Ambulatory Visit | Attending: Urology | Admitting: Urology

## 2018-04-23 DIAGNOSIS — Z9049 Acquired absence of other specified parts of digestive tract: Secondary | ICD-10-CM | POA: Insufficient documentation

## 2018-04-23 DIAGNOSIS — R310 Gross hematuria: Secondary | ICD-10-CM | POA: Insufficient documentation

## 2018-04-23 DIAGNOSIS — M47896 Other spondylosis, lumbar region: Secondary | ICD-10-CM | POA: Insufficient documentation

## 2018-04-23 DIAGNOSIS — N281 Cyst of kidney, acquired: Secondary | ICD-10-CM | POA: Diagnosis not present

## 2018-04-23 LAB — POCT I-STAT CREATININE: CREATININE: 1.1 mg/dL (ref 0.61–1.24)

## 2018-04-23 IMAGING — DX DG KNEE 1-2V*R*
2 series · 2 of 2 positions shown · non-contrast
Comparison: CT of the right knee of 04/13/2017

CLINICAL DATA: Post right total knee replacement

EXAM:
RIGHT KNEE - 1-2 VIEW

[knee ap]
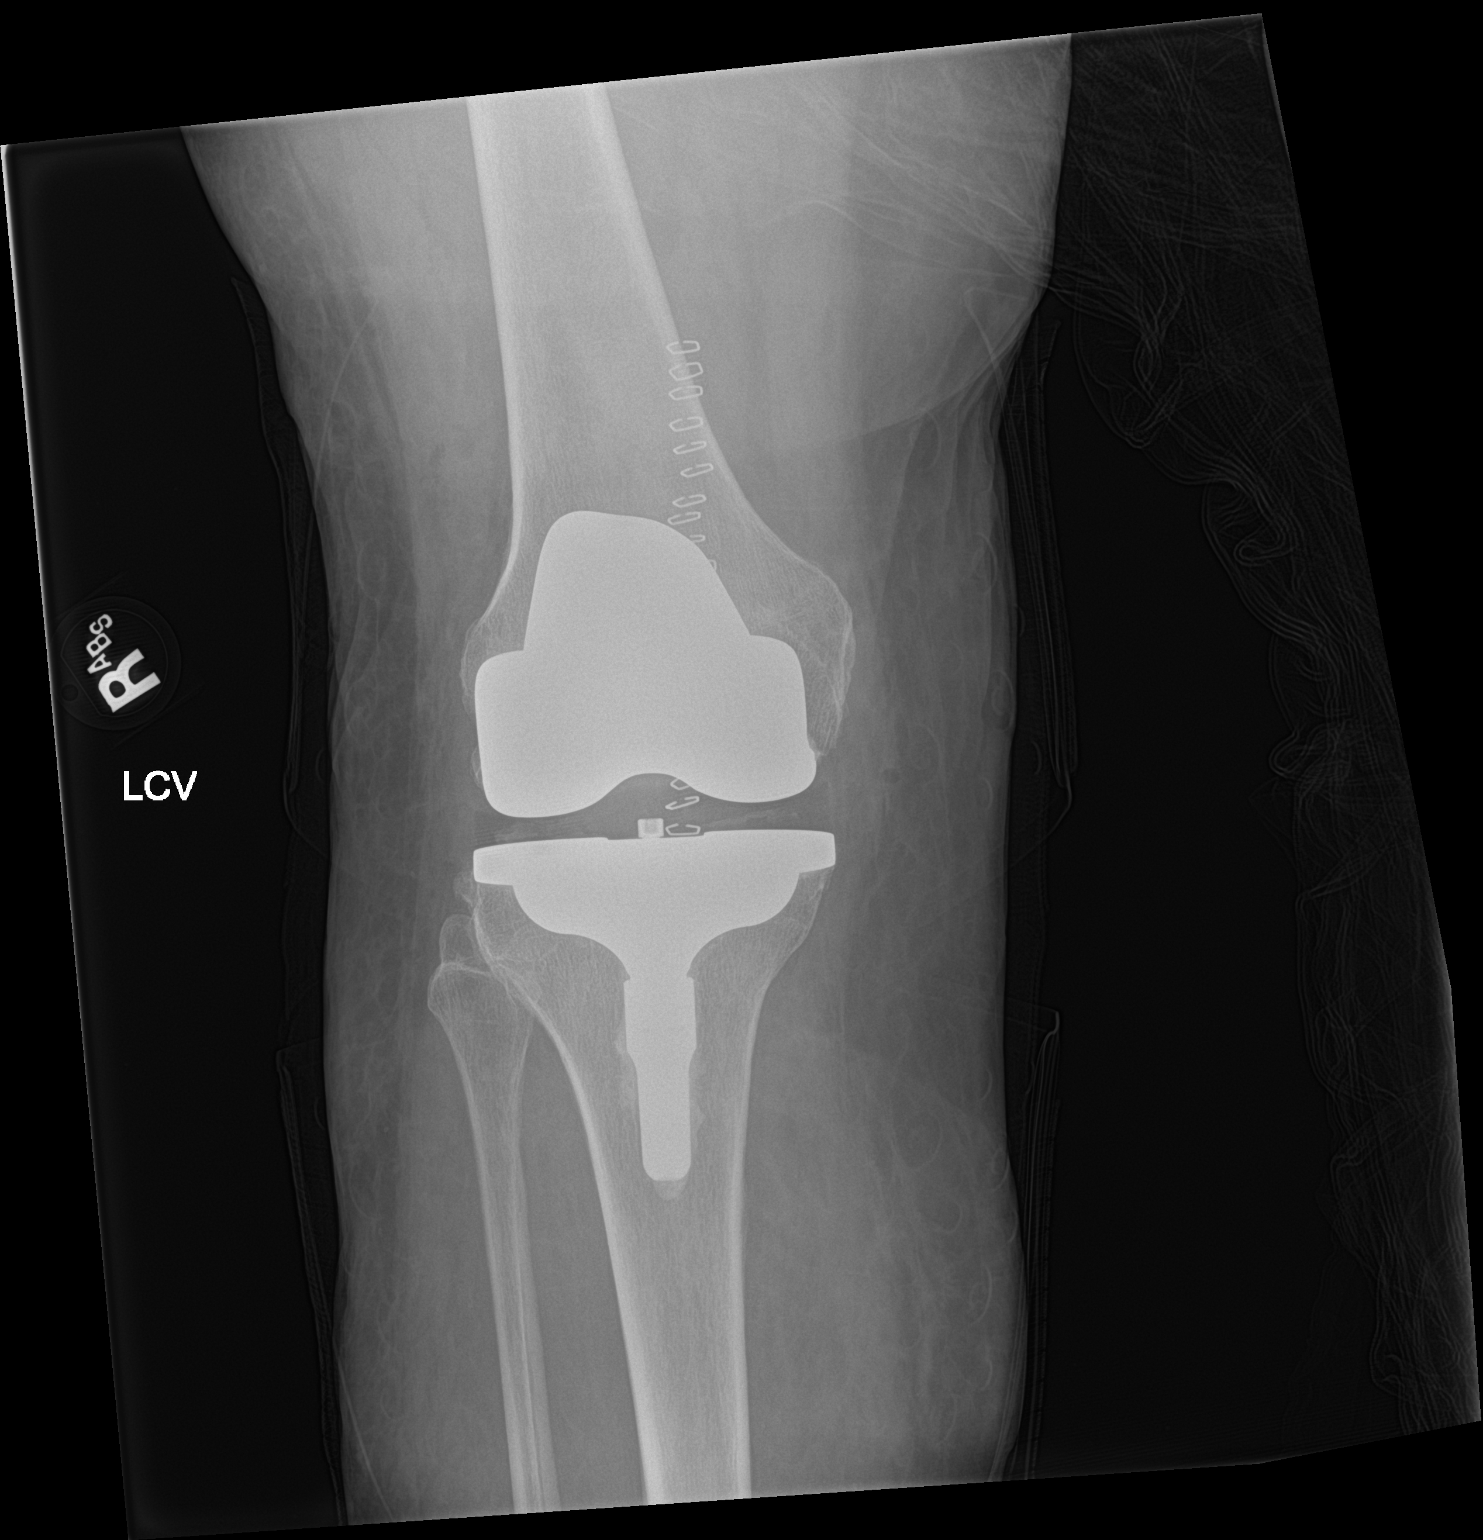

[knee lat]
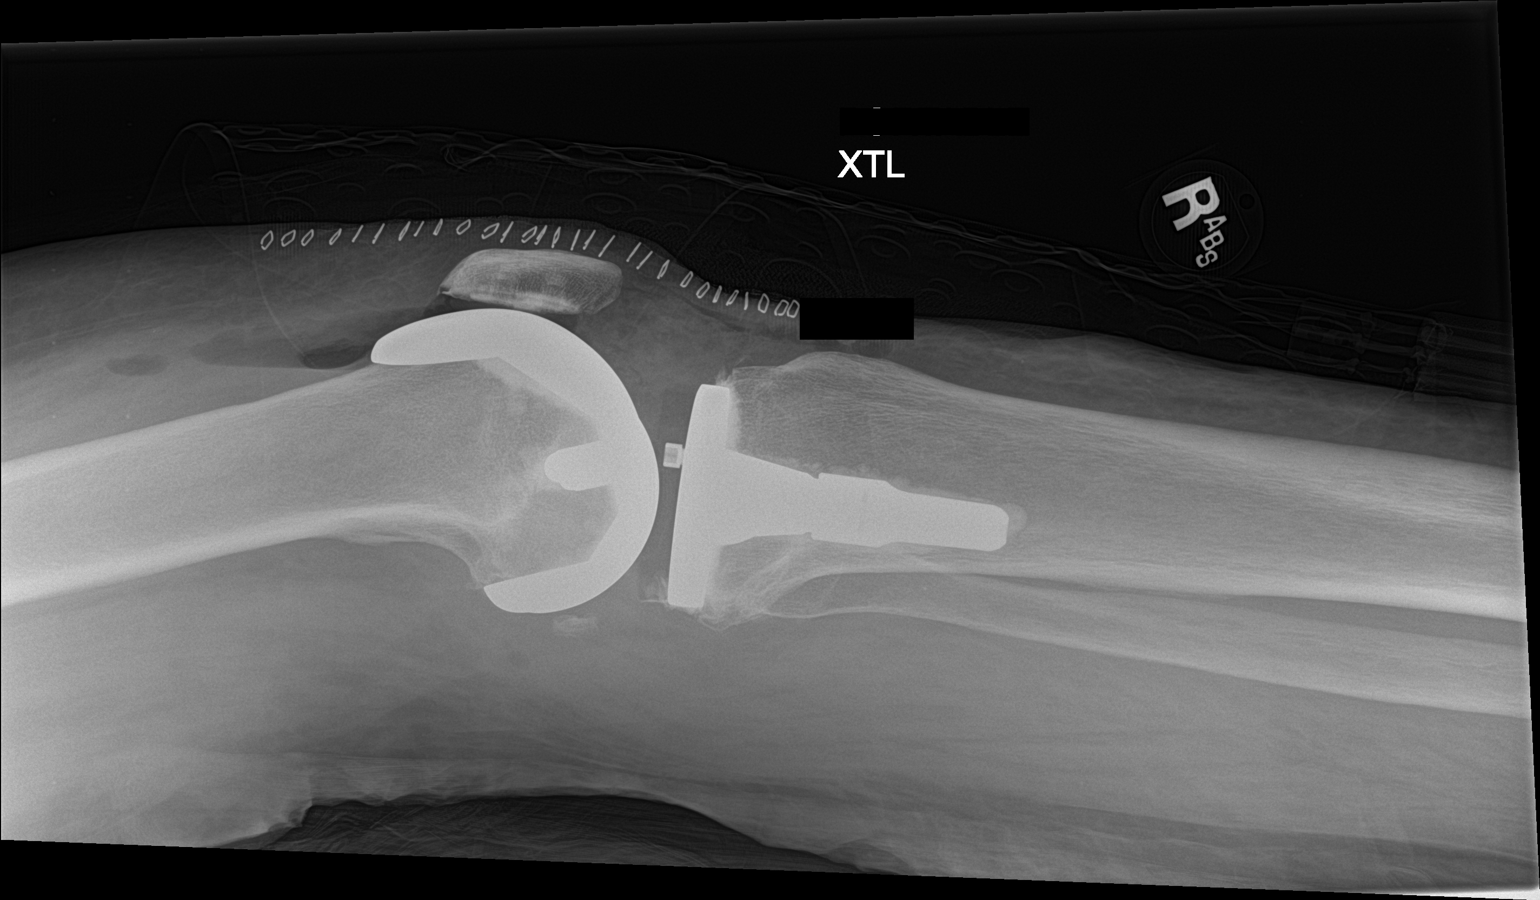

[2 of 2 positions shown; findings below may reference images not displayed]

FINDINGS: The femoral and tibial components of the right total knee
replacement appear to be in good position. No complicating features
are seen. Some air is noted in the soft tissues and joint space
postoperatively.
IMPRESSION: Right total knee replacement components in good position. No
complicating features.

## 2018-04-23 MED ORDER — IOPAMIDOL (ISOVUE-300) INJECTION 61%
125.0000 mL | Freq: Once | INTRAVENOUS | Status: AC | PRN
  Administered 2018-04-23: 125 mL via INTRAVENOUS

## 2018-04-24 ENCOUNTER — Telehealth: Payer: Self-pay

## 2018-04-24 NOTE — Telephone Encounter (Signed)
Left detailed message.   

## 2018-04-24 NOTE — Telephone Encounter (Signed)
-----   Message from Abbie Sons, MD sent at 04/24/2018  9:45 AM EDT ----- CT showed no significant abnormalities.  Keep scheduled cystoscopy appointment

## 2018-04-29 ENCOUNTER — Ambulatory Visit (INDEPENDENT_AMBULATORY_CARE_PROVIDER_SITE_OTHER): Payer: BC Managed Care – PPO | Admitting: Unknown Physician Specialty

## 2018-04-29 ENCOUNTER — Encounter: Payer: Self-pay | Admitting: Unknown Physician Specialty

## 2018-04-29 VITALS — BP 127/76 | HR 87 | Temp 98.1°F | Ht 70.0 in | Wt 258.4 lb

## 2018-04-29 DIAGNOSIS — I499 Cardiac arrhythmia, unspecified: Secondary | ICD-10-CM

## 2018-04-29 DIAGNOSIS — R001 Bradycardia, unspecified: Secondary | ICD-10-CM | POA: Insufficient documentation

## 2018-04-29 DIAGNOSIS — I10 Essential (primary) hypertension: Secondary | ICD-10-CM

## 2018-04-29 NOTE — Assessment & Plan Note (Addendum)
1st degree AV block.  Noted during evaluation with Dr. Candis Musa.  Note from 4/19 and EKG reviewed.

## 2018-04-29 NOTE — Progress Notes (Signed)
   BP 127/76 (BP Location: Right Arm, Cuff Size: Normal)   Pulse 87   Temp 98.1 F (36.7 C) (Oral)   Ht 5\' 10"  (1.778 m)   Wt 258 lb 6.4 oz (117.2 kg)   SpO2 98%   BMI 37.08 kg/m    Subjective:    Patient ID: Grant Diaz, male    DOB: 1951/07/31, 67 y.o.   MRN: [id number]  HPI: Grant Diaz is a 67 y.o. male  Chief Complaint  Patient presents with  . Hypertension   Hypertension Pt needs is last 3 reading on stationary.  Today: 127/76; 6/17: 131/85; 4/30: 142/82 Relevant past medical, surgical, family and social history reviewed and updated as indicated. Interim medical history since our last visit reviewed.  Hypertension Using medications without difficulty Average home BPs Not checking  No problems or lightheadedness No chest pain with exertion or shortness of breath No Edema   Using medications without problems: No Muscle aches  Diet compliance: Exercise:    Review of Systems  Per HPI unless specifically indicated above     Objective:    BP 127/76 (BP Location: Right Arm, Cuff Size: Normal)   Pulse 87   Temp 98.1 F (36.7 C) (Oral)   Ht 5\' 10"  (1.778 m)   Wt 258 lb 6.4 oz (117.2 kg)   SpO2 98%   BMI 37.08 kg/m   Wt Readings from Last 3 Encounters:  04/29/18 258 lb 6.4 oz (117.2 kg)  04/08/18 266 lb 12.8 oz (121 kg)  04/02/18 225 lb (102.1 kg)    Physical Exam  Constitutional: He is oriented to person, place, and time. He appears well-developed and well-nourished. No distress.  HENT:  Head: Normocephalic and atraumatic.  Eyes: Conjunctivae and lids are normal. Right eye exhibits no discharge. Left eye exhibits no discharge. No scleral icterus.  Neck: Normal range of motion. Neck supple. No JVD present. Carotid bruit is not present.  Cardiovascular: Normal heart sounds. An irregular rhythm present. Bradycardia present.  Pulmonary/Chest: Effort normal and breath sounds normal. No respiratory distress.  Abdominal: Normal appearance. There is no  splenomegaly or hepatomegaly.  Musculoskeletal: Normal range of motion.  Neurological: He is alert and oriented to person, place, and time.  Skin: Skin is warm, dry and intact. No rash noted. No pallor.  Psychiatric: He has a normal mood and affect. His behavior is normal. Judgment and thought content normal.   EKG: shows bradycardia and 1st degree AV block.  55 beats/minute    Assessment & Plan:   Problem List Items Addressed This Visit      Unprioritized   Bradycardia    1st degree AV block.  Noted during evaluation with Dr. Candis Musa.  Note from 4/19 and EKG reviewed.        Essential hypertension, benign    Last 3 readings are mostly to goal.  Will put on letter head. No changes made.  Needs CMP and lipid panel       Relevant Orders   Comprehensive metabolic panel   Lipid Panel w/o Chol/HDL Ratio   EKG 12-Lead (Completed)    Other Visit Diagnoses    Irregular heart rate    -  Primary   Relevant Orders   EKG 12-Lead (Completed)       Follow up plan: Return in about 6 months (around 10/30/2018) for physical.

## 2018-04-29 NOTE — Assessment & Plan Note (Addendum)
Last 3 readings are mostly to goal.  Will put on letter head. No changes made.  Needs CMP and lipid panel

## 2018-04-30 ENCOUNTER — Other Ambulatory Visit: Payer: BC Managed Care – PPO

## 2018-05-01 LAB — COMPREHENSIVE METABOLIC PANEL
ALBUMIN: 4 g/dL (ref 3.6–4.8)
ALT: 12 IU/L (ref 0–44)
AST: 20 IU/L (ref 0–40)
Albumin/Globulin Ratio: 1.6 (ref 1.2–2.2)
Alkaline Phosphatase: 65 IU/L (ref 39–117)
BUN / CREAT RATIO: 27 — AB (ref 10–24)
BUN: 26 mg/dL (ref 8–27)
Bilirubin Total: 0.5 mg/dL (ref 0.0–1.2)
CALCIUM: 9.3 mg/dL (ref 8.6–10.2)
CHLORIDE: 102 mmol/L (ref 96–106)
CO2: 22 mmol/L (ref 20–29)
CREATININE: 0.95 mg/dL (ref 0.76–1.27)
GFR, EST AFRICAN AMERICAN: 95 mL/min/{1.73_m2} (ref >59)
GFR, EST NON AFRICAN AMERICAN: 82 mL/min/{1.73_m2} (ref >59)
GLUCOSE: 87 mg/dL (ref 65–99)
Globulin, Total: 2.5 g/dL (ref 1.5–4.5)
Potassium: 3.9 mmol/L (ref 3.5–5.2)
Sodium: 140 mmol/L (ref 134–144)
TOTAL PROTEIN: 6.5 g/dL (ref 6.0–8.5)

## 2018-05-01 LAB — LIPID PANEL W/O CHOL/HDL RATIO
Cholesterol, Total: 138 mg/dL (ref 100–199)
HDL: 37 mg/dL — AB (ref >39)
LDL CALC: 85 mg/dL (ref 0–99)
Triglycerides: 79 mg/dL (ref 0–149)
VLDL CHOLESTEROL CAL: 16 mg/dL (ref 5–40)

## 2018-05-01 NOTE — Progress Notes (Signed)
Normal labs.  Pt notified through mychart

## 2018-05-20 ENCOUNTER — Encounter: Payer: Self-pay | Admitting: Urology

## 2018-05-24 ENCOUNTER — Encounter: Payer: Self-pay | Admitting: Urology

## 2018-05-24 ENCOUNTER — Ambulatory Visit (INDEPENDENT_AMBULATORY_CARE_PROVIDER_SITE_OTHER): Payer: BC Managed Care – PPO | Admitting: Urology

## 2018-05-24 VITALS — BP 132/76 | HR 82 | Ht 69.0 in | Wt 256.0 lb

## 2018-05-24 DIAGNOSIS — R310 Gross hematuria: Secondary | ICD-10-CM

## 2018-05-24 LAB — MICROSCOPIC EXAMINATION: WBC, UA: NONE SEEN /hpf (ref 0–5)

## 2018-05-24 LAB — URINALYSIS, COMPLETE
Bilirubin, UA: NEGATIVE
Glucose, UA: NEGATIVE
Ketones, UA: NEGATIVE
LEUKOCYTES UA: NEGATIVE
Nitrite, UA: NEGATIVE
Protein, UA: NEGATIVE
RBC, UA: NEGATIVE
SPEC GRAV UA: 1.02 (ref 1.005–1.030)
Urobilinogen, Ur: 0.2 mg/dL (ref 0.2–1.0)
pH, UA: 7 (ref 5.0–7.5)

## 2018-05-24 MED ORDER — CIPROFLOXACIN HCL 500 MG PO TABS
500.0000 mg | ORAL_TABLET | Freq: Once | ORAL | Status: AC
  Administered 2018-05-24: 500 mg via ORAL

## 2018-05-24 MED ORDER — LIDOCAINE HCL URETHRAL/MUCOSAL 2 % EX GEL
1.0000 | Freq: Once | CUTANEOUS | Status: AC
Start: 2018-05-24 — End: 2018-05-24
  Administered 2018-05-24: 1 via URETHRAL

## 2018-05-24 NOTE — Progress Notes (Signed)
   05/24/18  CC:  Chief Complaint  Patient presents with  . Cysto    HPI: History total gross painless hematuria April 2019.  Had brachytherapy/IMRT in 2008 for prostate cancer.  Denies recurrent episodes hematuria.  CTU 04/23/2018 shows no upper tract abnormalities.  Blood pressure 132/76, pulse 82, height 5\' 9"  (1.753 m), weight 256 lb (116.1 kg). NED. A&Ox3.     Cystoscopy Procedure Note  Patient identification was confirmed, informed consent was obtained, and patient was prepped using Betadine solution.  Lidocaine jelly was administered per urethral meatus.    Preoperative abx where received prior to procedure.     Pre-Procedure: - Inspection reveals a normal caliber ureteral meatus.  Procedure: The flexible cystoscope was introduced without difficulty -Wide caliber bulbar urethral strictures which did not impede scope passage. - Prominent superficial veins/friable; nonocclusive prostate  - Normal bladder neck - Bilateral ureteral orifices identified - Bladder mucosa  reveals no ulcers, tumors, or lesions - No bladder stones - No trabeculation  Retroflexion shows hypervascularity   Post-Procedure: - Patient tolerated the procedure well  Assessment/ Plan: No significant upper tract abnormalities.  No bladder findings.  Most likely source of his gross hematuria was prostatic in origin.  Urine cytology was ordered.  Follow-up 6 months for symptom check/repeat urinalysis.

## 2018-05-29 ENCOUNTER — Other Ambulatory Visit: Payer: Self-pay | Admitting: Urology

## 2018-06-04 ENCOUNTER — Telehealth: Payer: Self-pay | Admitting: Urology

## 2018-06-04 NOTE — Telephone Encounter (Signed)
Pt LMOM stating that he had a Cysto recently and is having continued bleeding since the Cysto. Pt is concerned about this and would like to speak to someone.  Please advise pt @ [phone]. Thank you

## 2018-06-06 NOTE — Progress Notes (Signed)
Urine cytology showed no evidence of malignant cells. 

## 2018-06-07 NOTE — Progress Notes (Signed)
Patient notified

## 2018-06-07 NOTE — Telephone Encounter (Signed)
LMOM for patient to return call.

## 2018-06-10 ENCOUNTER — Ambulatory Visit: Payer: BC Managed Care – PPO | Admitting: Unknown Physician Specialty

## 2018-06-12 NOTE — Telephone Encounter (Signed)
Pt states he is doing much better, still sees some blood every now and then, but is not concerned.

## 2018-09-23 ENCOUNTER — Other Ambulatory Visit: Payer: Self-pay | Admitting: Unknown Physician Specialty

## 2018-09-24 NOTE — Telephone Encounter (Signed)
Requested Prescriptions  Pending Prescriptions Disp Refills  . amLODipine (NORVASC) 10 MG tablet [Pharmacy Med Name: AMLODIPINE 10MG  TAB] 90 tablet 1    Sig: TAKE 1 TABLET BY MOUTH ONCE DAILY     Cardiovascular:  Calcium Channel Blockers Passed - 09/23/2018  6:56 AM      Passed - Last BP in normal range    BP Readings from Last 1 Encounters:  05/24/18 132/76         Passed - Valid encounter within last 6 months    Recent Outpatient Visits          4 months ago Irregular heart rate   [facility] Kathrine Haddock, NP   7 months ago Colon cancer screening   [facility] Kathrine Haddock, NP   9 months ago SOB (shortness of breath) on exertion   [place], NP   12 months ago Obesity (BMI 30-39.9)   [facility] Kathrine Haddock, NP      Future Appointments            In 2 months Cannady, Barbaraann Faster, NP [facility]

## 2018-11-28 ENCOUNTER — Other Ambulatory Visit: Payer: Self-pay | Admitting: Nurse Practitioner

## 2018-11-28 ENCOUNTER — Encounter: Payer: Self-pay | Admitting: Nurse Practitioner

## 2018-11-28 ENCOUNTER — Telehealth: Payer: Self-pay | Admitting: Nurse Practitioner

## 2018-11-28 ENCOUNTER — Ambulatory Visit: Payer: BC Managed Care – PPO | Admitting: Nurse Practitioner

## 2018-11-28 VITALS — BP 120/68 | HR 74 | Temp 98.1°F | Wt 246.0 lb

## 2018-11-28 DIAGNOSIS — H60332 Swimmer's ear, left ear: Secondary | ICD-10-CM

## 2018-11-28 DIAGNOSIS — Z23 Encounter for immunization: Secondary | ICD-10-CM

## 2018-11-28 DIAGNOSIS — H6092 Unspecified otitis externa, left ear: Secondary | ICD-10-CM | POA: Insufficient documentation

## 2018-11-28 MED ORDER — PREDNISONE 10 MG PO TABS: 30.0000 mg | ORAL_TABLET | Freq: Every day | ORAL | 0 refills | Status: AC

## 2018-11-28 MED ORDER — NEOMYCIN-POLYMYXIN-HC 1 % OT SOLN: 3.0000 [drp] | Freq: Four times a day (QID) | OTIC | 0 refills | Status: DC

## 2018-11-28 MED ORDER — CIPROFLOXACIN-HYDROCORTISONE 0.2-1 % OT SUSP: 3.0000 [drp] | Freq: Two times a day (BID) | OTIC | 0 refills | Status: DC

## 2018-11-28 NOTE — Patient Instructions (Signed)
Influenza (Flu) Vaccine (Inactivated or Recombinant): What You Need to Know  1. Why get vaccinated?  Influenza vaccine can prevent influenza (flu).  Flu is a contagious disease that spreads around the [place] every year, usually between October and May. Anyone can get the flu, but it is more dangerous for some people. Infants and young children, people 70 years of age and older, pregnant women, and people with certain health conditions or a weakened immune system are at greatest risk of flu complications.  Pneumonia, bronchitis, sinus infections and ear infections are examples of flu-related complications. If you have a medical condition, such as heart disease, cancer or diabetes, flu can make it worse.  Flu can cause fever and chills, sore throat, muscle aches, fatigue, cough, headache, and runny or stuffy nose. Some people may have vomiting and diarrhea, though this is more common in children than adults.  Each year thousands of people in the [place] die from flu, and many more are hospitalized. Flu vaccine prevents millions of illnesses and flu-related visits to the doctor each year.  2. Influenza vaccine  CDC recommends everyone 6 months of age and older get vaccinated every flu season. Children 6 months through 8 years of age may need 2 doses during a single flu season. Everyone else needs only 1 dose each flu season.  It takes about 2 weeks for protection to develop after vaccination.  There are many flu viruses, and they are always changing. Each year a new flu vaccine is made to protect against three or four viruses that are likely to cause disease in the upcoming flu season. Even when the vaccine doesn't exactly match these viruses, it may still provide some protection.  Influenza vaccine does not cause flu.  Influenza vaccine may be given at the same time as other vaccines.  3. Talk with your health care provider  Tell your vaccine provider if the person getting the vaccine:  · Has had an  allergic reaction after a previous dose of influenza vaccine, or has any severe, life-threatening allergies.  · Has ever had Guillain-Barré Syndrome (also called GBS).  In some cases, your health care provider may decide to postpone influenza vaccination to a future visit.  People with minor illnesses, such as a cold, may be vaccinated. People who are moderately or severely ill should usually wait until they recover before getting influenza vaccine.  Your health care provider can give you more information.  4. Risks of a vaccine reaction  · Soreness, redness, and swelling where shot is given, fever, muscle aches, and headache can happen after influenza vaccine.  · There may be a very small increased risk of Guillain-Barré Syndrome (GBS) after inactivated influenza vaccine (the flu shot).  Young children who get the flu shot along with pneumococcal vaccine (PCV13), and/or DTaP vaccine at the same time might be slightly more likely to have a seizure caused by fever. Tell your health care provider if a child who is getting flu vaccine has ever had a seizure.  People sometimes faint after medical procedures, including vaccination. Tell your provider if you feel dizzy or have vision changes or ringing in the ears.  As with any medicine, there is a very remote chance of a vaccine causing a severe allergic reaction, other serious injury, or death.  5. What if there is a serious problem?  An allergic reaction could occur after the vaccinated person leaves the clinic. If you see signs of a severe allergic reaction (hives, swelling   of the face and throat, difficulty breathing, a fast heartbeat, dizziness, or weakness), call 9-1-1 and get the person to the nearest hospital.  For other signs that concern you, call your health care provider.  Adverse reactions should be reported to the Vaccine Adverse Event Reporting System (VAERS). Your health care provider will usually file this report, or you can do it yourself. Visit the  VAERS website at www.vaers.hhs.gov or call 1-800-822-7967.VAERS is only for reporting reactions, and VAERS staff do not give medical advice.  6. The National Vaccine Injury Compensation Program  The National Vaccine Injury Compensation Program (VICP) is a federal program that was created to compensate people who may have been injured by certain vaccines. Visit the VICP website at www.hrsa.gov/vaccinecompensation or call 1-800-338-2382 to learn about the program and about filing a claim. There is a time limit to file a claim for compensation.  7. How can I learn more?  · Ask your healthcare provider.  · Call your local or state health department.  · Contact the Centers for Disease Control and Prevention (CDC):  ? Call 1-800-232-4636 (1-800-CDC-INFO) or  ? Visit CDC's www.cdc.gov/flu  Vaccine Information Statement (Interim) Inactivated Influenza Vaccine (06/06/2018)  This information is not intended to replace advice given to you by your health care provider. Make sure you discuss any questions you have with your health care provider.  Document Released: 08/03/2006 Document Revised: 06/10/2018 Document Reviewed: 06/10/2018  Elsevier Interactive Patient Education © 2019 Elsevier Inc.

## 2018-11-28 NOTE — Telephone Encounter (Signed)
Copied from [place] [id number]. Topic: Quick Communication - See Telephone Encounter >> Nov 28, 2018  1:15 PM Antonieta Iba C wrote: CRM for notification. See Telephone encounter for: 11/28/18.  Wells Guiles with pharmacy is calling in to suggest Cortisporin Odic Suspension - 4 drops in affected ear 3/4 times  a day instead of what provider has prescribed. Pharmacy states that the current medication prescribed isnt covered by pt's insurance and out of pocket is over 300.00.    CB: [phone] - Pharmacy:

## 2018-11-28 NOTE — Telephone Encounter (Signed)
New script sent

## 2018-11-28 NOTE — Assessment & Plan Note (Signed)
Acute.  Cipro otic solution ordered and Prednisone 30 MG x 5 days for eustachian tube dysfunction.  Encouraged not to use Debrox or Q-tips.  Use ear plugs while in pool.  Return for worsening or continued symptoms.

## 2018-11-28 NOTE — Progress Notes (Signed)
BP 120/68 (BP Location: Left Arm, Patient Position: Sitting)   Pulse 74   Temp 98.1 F (36.7 C) (Oral)   Wt 246 lb (111.6 kg)   SpO2 98%   BMI 36.33 kg/m    Subjective:    Patient ID: Grant Diaz, male    DOB: 12-21-1950, 68 y.o.   MRN: [id number]  HPI: Grant Diaz is a 68 y.o. male  Chief Complaint  Patient presents with  . Ear Problem    pt states he feels like he has swimmers ear in his left ear, states it has felt this way for a few months now    EAR PAIN Has had left ear discomfort for two months.  Swims at [facility] and reports he got some water in area.   Reports since then has felt like something is swishing in ear and ringing.   Duration: months Involved ear(s): left Severity:  no pain reported  Quality:  feels like something in ear Fever: no Otorrhea: no Upper respiratory infection symptoms: no Pruritus: yes Hearing loss: no Water immersion yes Using Q-tips: no Recurrent otitis media: no Status: fluctuating Treatments attempted: tried a home remedy, Debrox  Relevant past medical, surgical, family and social history reviewed and updated as indicated. Interim medical history since our last visit reviewed. Allergies and medications reviewed and updated.  Review of Systems  Constitutional: Negative for activity change, diaphoresis, fatigue and fever.  Respiratory: Negative for cough, chest tightness, shortness of breath and wheezing.   Cardiovascular: Negative for chest pain, palpitations and leg swelling.  Gastrointestinal: Negative for abdominal distention, abdominal pain, constipation, diarrhea, nausea and vomiting.  Endocrine: Negative for cold intolerance, heat intolerance, polydipsia, polyphagia and polyuria.  Musculoskeletal: Negative.   Skin: Negative.   Neurological: Negative for dizziness, syncope, weakness, light-headedness, numbness and headaches.  Psychiatric/Behavioral: Negative.     Per HPI unless specifically indicated above       Objective:    BP 120/68 (BP Location: Left Arm, Patient Position: Sitting)   Pulse 74   Temp 98.1 F (36.7 C) (Oral)   Wt 246 lb (111.6 kg)   SpO2 98%   BMI 36.33 kg/m   Wt Readings from Last 3 Encounters:  11/28/18 246 lb (111.6 kg)  05/24/18 256 lb (116.1 kg)  04/29/18 258 lb 6.4 oz (117.2 kg)    Physical Exam Vitals signs and nursing note reviewed.  Constitutional:      General: He is awake.     Appearance: He is well-developed. He is not ill-appearing.  HENT:     Head: Normocephalic and atraumatic.     Right Ear: Hearing, tympanic membrane and external ear normal. No decreased hearing noted. No drainage. No middle ear effusion.     Left Ear: Hearing and external ear normal. No decreased hearing noted. No drainage. A middle ear effusion is present.     Ears:     Comments: Mild erythema in left canal.  Minimal cerumen bilateral ear.  Left TM with mild effusion, able to visualize bony landmarks bilaterally.  Hearing intact.    Mouth/Throat:     Pharynx: Uvula midline.  Eyes:     General: Lids are normal.        Right eye: No discharge.        Left eye: No discharge.     Conjunctiva/sclera: Conjunctivae normal.     Pupils: Pupils are equal, round, and reactive to light.  Neck:     Musculoskeletal: Normal range of motion and  neck supple.     Thyroid: No thyromegaly.     Vascular: No carotid bruit or JVD.     Trachea: Trachea normal.  Cardiovascular:     Rate and Rhythm: Normal rate and regular rhythm.     Heart sounds: Normal heart sounds, S1 normal and S2 normal. No murmur. No gallop.   Pulmonary:     Effort: Pulmonary effort is normal.     Breath sounds: Normal breath sounds.  Abdominal:     General: Bowel sounds are normal.     Palpations: Abdomen is soft. There is no hepatomegaly or splenomegaly.  Musculoskeletal: Normal range of motion.     Right lower leg: No edema.     Left lower leg: No edema.  Skin:    General: Skin is warm and dry.     Capillary Refill:  Capillary refill takes less than 2 seconds.     Findings: No rash.  Neurological:     Mental Status: He is alert and oriented to person, place, and time.     Deep Tendon Reflexes: Reflexes are normal and symmetric.  Psychiatric:        Mood and Affect: Mood normal.        Behavior: Behavior normal. Behavior is cooperative.        Thought Content: Thought content normal.        Judgment: Judgment normal.     Results for orders placed or performed in visit on 05/24/18  Microscopic Examination  Result Value Ref Range   WBC, UA None seen 0 - 5 /hpf   RBC, UA 0-2 0 - 2 /hpf   Epithelial Cells (non renal) 0-10 0 - 10 /hpf   Casts Present (A) None seen /lpf   Cast Type Hyaline casts N/A   Mucus, UA Present (A) Not Estab.   Bacteria, UA Few (A) None seen/Few  Urinalysis, Complete  Result Value Ref Range   Specific Gravity, UA 1.020 1.005 - 1.030   pH, UA 7.0 5.0 - 7.5   Color, UA Yellow Yellow   Appearance Ur Clear Clear   Leukocytes, UA Negative Negative   Protein, UA Negative Negative/Trace   Glucose, UA Negative Negative   Ketones, UA Negative Negative   RBC, UA Negative Negative   Bilirubin, UA Negative Negative   Urobilinogen, Ur 0.2 0.2 - 1.0 mg/dL   Nitrite, UA Negative Negative   Microscopic Examination See below:       Assessment & Plan:   Problem List Items Addressed This Visit      Nervous and Auditory   External otitis of left ear    Acute.  Cipro otic solution ordered and Prednisone 30 MG x 5 days for eustachian tube dysfunction.  Encouraged not to use Debrox or Q-tips.  Use ear plugs while in pool.  Return for worsening or continued symptoms.       Other Visit Diagnoses    Need for influenza vaccination    -  Primary   Relevant Orders   Flu vaccine HIGH DOSE PF (Completed)       Follow up plan: Return if symptoms worsen or fail to improve & as scheduled.

## 2018-11-28 NOTE — Progress Notes (Signed)
Ear drop change script sent per pharmacist recommendations.

## 2018-11-29 ENCOUNTER — Encounter: Payer: Self-pay | Admitting: Urology

## 2018-11-29 ENCOUNTER — Ambulatory Visit: Payer: BC Managed Care – PPO | Admitting: Urology

## 2018-11-29 VITALS — BP 111/65 | HR 84 | Ht 69.0 in | Wt 240.0 lb

## 2018-11-29 DIAGNOSIS — R310 Gross hematuria: Secondary | ICD-10-CM | POA: Diagnosis not present

## 2018-11-29 DIAGNOSIS — Z8546 Personal history of malignant neoplasm of prostate: Secondary | ICD-10-CM | POA: Diagnosis not present

## 2018-11-29 LAB — MICROSCOPIC EXAMINATION
Bacteria, UA: NONE SEEN
Epithelial Cells (non renal): NONE SEEN /hpf (ref 0–10)
WBC, UA: NONE SEEN /hpf (ref 0–5)

## 2018-11-29 LAB — URINALYSIS, COMPLETE
Bilirubin, UA: NEGATIVE
Glucose, UA: NEGATIVE
Ketones, UA: NEGATIVE
Leukocytes, UA: NEGATIVE
Nitrite, UA: NEGATIVE
Protein, UA: NEGATIVE
RBC, UA: NEGATIVE
Specific Gravity, UA: 1.02 (ref 1.005–1.030)
Urobilinogen, Ur: 0.2 mg/dL (ref 0.2–1.0)
pH, UA: 7 (ref 5.0–7.5)

## 2018-11-29 NOTE — Progress Notes (Signed)
11/29/2018 10:43 AM   Grant Diaz 1951/08/13 [id number]  Referring provider: Kathrine Haddock, NP [address], [place] [address]  Chief Complaint  Patient presents with  . Hematuria    6 month    Urologic history: 1. 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  He did pass a small blood clot approximately 3 weeks ago and had one episode of hematuria 1 week later.  He denies flank/abdominal/pelvic or scrotal pain.  He has no bothersome lower urinary tract symptoms including frequency, urgency or dysuria.   PMH: Past Medical History:  Diagnosis Date  . Arthritis   . Cancer [facility]) 2008   Prostate-radiation  . Chronic kidney disease    kidney stones  remote history  . GERD (gastroesophageal reflux disease)   . History of DVT (deep vein thrombosis)   . Hypertension   . Joint pain   . Pancreatitis 2013   [facility]  . Prostate cancer ([place])   . Pulmonary embolus [facility]) March 2016   Bilateral pulmonary embolus, [facility]  . Sleep apnea, obstructive    cpap    Surgical History: Past Surgical History:  Procedure Laterality Date  . CARPAL TUNNEL RELEASE Right 03/15/2017   Procedure: CARPAL TUNNEL RELEASE;  Surgeon: Hessie Knows, MD;  Location: [facility] ORS;  Service: Orthopedics;  Laterality: Right;  . CHOLECYSTECTOMY    . COLONOSCOPY WITH PROPOFOL N/A 04/02/2018   Procedure: COLONOSCOPY WITH PROPOFOL;  Surgeon: Lucilla Lame, MD;  Location[facility] ENDOSCOPY;  Service: Endoscopy;  Laterality: N/A;  . ESOPHAGOGASTRODUODENOSCOPY N/A 05/28/2013   Procedure: ESOPHAGOGASTRODUODENOSCOPY (EGD);  Surgeon: Shann Medal, MD;  Location: [facility] END[facility];  Service: General;  Laterality: N/A;  . GALLBLADDER SURGERY   2003  . GANGLION CYST EXCISION Right 03/15/2017   Procedure: REMOVAL GANGLION OF WRIST;  Surgeon: Hessie Knows, MD;  Location: [facility] ORS;  Service: Orthopedics;  Laterality: Right;  . HERNIA REPAIR    . hose  2011  . KNEE ARTHROSCOPY Bilateral [id number]  . LAPAROSCOPIC GASTRIC BANDING  2009   and hernia repair  . RADIATION IMPLANT W/ ULTRASOUND, MALE  2005   internal/external radiation for prostate cancer  . TOTAL KNEE ARTHROPLASTY Left 10/12/2015   Procedure: TOTAL KNEE ARTHROPLASTY;  Surgeon: Hessie Knows, MD;  Location: [facility] ORS;  Service: Orthopedics;  Laterality: Left;  . TOTAL KNEE ARTHROPLASTY Right 05/15/2017   Procedure: TOTAL KNEE ARTHROPLASTY;  Surgeon: Hessie Knows, MD;  Location: [facility] ORS;  Service: Orthopedics;  Laterality: Right;  . UMBILICAL HERNIA REPAIR  2010    Home Medications:  Allergies as of 11/29/2018      Reactions   Adhesive [tape] Rash   "whelps", latex adhesive tape only, paper tape is ok       Medication List       Accurate as of November 29, 2018 10:43 AM. Always use your most recent med list.        acetaminophen 325 MG tablet Commonly known as:  TYLENOL Take 650 mg by mouth every 6 (six) hours as needed.   amLODipine 10 MG tablet Commonly known as:  NORVASC TAKE 1 TABLET BY MOUTH ONCE DAILY   aspirin EC 81 MG tablet Take 81 mg by mouth daily.   celecoxib 200 MG capsule Commonly known as:  CELEBREX  Take 200 mg by mouth daily.   ciprofloxacin-hydrocortisone OTIC suspension Commonly known as:  CIPRO HC OTIC Place 3 drops into the left ear 2 (two) times daily.   D3-1000 25 MCG (1000 UT) capsule Generic drug:  Cholecalciferol Take 1,000 Units by mouth daily.   ketoconazole 2 % cream Commonly known as:  NIZORAL Apply 1 application topically daily as needed.   lisinopril-hydrochlorothiazide 20-25 MG tablet Commonly known as:  PRINZIDE,ZESTORETIC Take 1 tablet by mouth daily.   multivitamin capsule Take 1 capsule by mouth daily.     NEOMYCIN-POLYMYXIN-HYDROCORTISONE 1 % Soln OTIC solution Commonly known as:  CORTISPORIN Place 3 drops into the left ear 4 (four) times daily.   omeprazole 20 MG capsule Commonly known as:  PRILOSEC Take 20 mg by mouth every other day.   predniSONE 10 MG tablet Commonly known as:  DELTASONE Take 3 tablets (30 mg total) by mouth daily with breakfast for 5 days.   triamcinolone 0.025 % cream Commonly known as:  KENALOG Apply 1 application topically 2 (two) times daily as needed (skin rash).   Turmeric 500 MG Caps Take 1 capsule by mouth daily.       Allergies:  Allergies  Allergen Reactions  . Adhesive [Tape] Rash    "whelps", latex adhesive tape only, paper tape is ok     Family History: Family History  Problem Relation Age of Onset  . Diabetes Mother   . Parkinson's disease Father   . Hypertension Brother   . Diabetes Son        type 1  . Heart disease Maternal Grandmother        CHF  . Stroke Maternal Grandfather   . Heart disease Paternal Grandmother        CHF  . Stroke Paternal Grandfather     Social History:  reports that he has never smoked. He has never used smokeless tobacco. He reports that he does not drink alcohol or use drugs.  ROS: UROLOGY Frequent Urination?: No Hard to postpone urination?: No Burning/pain with urination?: No Get up at night to urinate?: No Leakage of urine?: No Urine stream starts and stops?: No Trouble starting stream?: No Do you have to strain to urinate?: No Blood in urine?: Yes Urinary tract infection?: No Sexually transmitted disease?: No Injury to kidneys or bladder?: No Painful intercourse?: No Weak stream?: No Erection problems?: No Penile pain?: No  Gastrointestinal Nausea?: No Vomiting?: No Indigestion/heartburn?: No Diarrhea?: No Constipation?: No  Constitutional Fever: No Night sweats?: No Weight loss?: No Fatigue?: No  Skin Skin rash/lesions?: No Itching?: No  Eyes Blurred vision?:  No Double vision?: No  Ears/Nose/Throat Sore throat?: No Sinus problems?: No  Hematologic/Lymphatic Swollen glands?: No Easy bruising?: No  Cardiovascular Leg swelling?: No Chest pain?: No  Respiratory Cough?: No Shortness of breath?: No  Endocrine Excessive thirst?: No  Musculoskeletal Back pain?: No Joint pain?: No  Neurological Headaches?: No Dizziness?: No  Psychologic Depression?: No Anxiety?: No  Physical Exam: BP 111/65   Pulse 84   Ht 5\' 9"  (1.753 m)   Wt 240 lb (108.9 kg)   BMI 35.44 kg/m   Constitutional:  Alert and oriented, No acute distress. HEENT: [place] AT, moist mucus membranes.  Trachea midline, no masses. Cardiovascular: No clubbing, cyanosis, or edema. Respiratory: Normal respiratory effort, no increased work of breathing. Skin: No rashes, bruises or suspicious lesions. Neurologic: Grossly intact, no focal deficits, moving all 4 extremities. Psychiatric: Normal mood and affect.  Laboratory Data:  Urinalysis  Dipstick/microscopy negative  Assessment & Plan:   Recent hematuria evaluation showed no significant abnormalities.  Etiology most likely prostatic.  A urine cytology was negative.  We will continue to monitor.  Follow-up 1 year however would repeat cystoscopy for recurrent episodes of gross hematuria.   Abbie Sons, [facility] [address], [place], [place] [address] [phone]

## 2018-12-11 ENCOUNTER — Encounter: Payer: BC Managed Care – PPO | Admitting: Nurse Practitioner

## 2018-12-24 ENCOUNTER — Encounter: Payer: Self-pay | Admitting: Nurse Practitioner

## 2018-12-24 ENCOUNTER — Ambulatory Visit (INDEPENDENT_AMBULATORY_CARE_PROVIDER_SITE_OTHER): Payer: BC Managed Care – PPO | Admitting: Nurse Practitioner

## 2018-12-24 VITALS — BP 133/81 | HR 66 | Temp 98.1°F | Ht 67.95 in | Wt 240.4 lb

## 2018-12-24 DIAGNOSIS — Z1159 Encounter for screening for other viral diseases: Secondary | ICD-10-CM

## 2018-12-24 DIAGNOSIS — Z23 Encounter for immunization: Secondary | ICD-10-CM | POA: Diagnosis not present

## 2018-12-24 DIAGNOSIS — Z0000 Encounter for general adult medical examination without abnormal findings: Secondary | ICD-10-CM

## 2018-12-24 DIAGNOSIS — E669 Obesity, unspecified: Secondary | ICD-10-CM | POA: Diagnosis not present

## 2018-12-24 DIAGNOSIS — G4733 Obstructive sleep apnea (adult) (pediatric): Secondary | ICD-10-CM

## 2018-12-24 DIAGNOSIS — Z86711 Personal history of pulmonary embolism: Secondary | ICD-10-CM

## 2018-12-24 DIAGNOSIS — I10 Essential (primary) hypertension: Secondary | ICD-10-CM

## 2018-12-24 DIAGNOSIS — Z13220 Encounter for screening for lipoid disorders: Secondary | ICD-10-CM

## 2018-12-24 DIAGNOSIS — Z8546 Personal history of malignant neoplasm of prostate: Secondary | ICD-10-CM

## 2018-12-24 DIAGNOSIS — K219 Gastro-esophageal reflux disease without esophagitis: Secondary | ICD-10-CM | POA: Diagnosis not present

## 2018-12-24 MED ORDER — ZOSTER VAC RECOMB ADJUVANTED 50 MCG/0.5ML IM SUSR: 0.5000 mL | Freq: Once | INTRAMUSCULAR | 0 refills | Status: AC

## 2018-12-24 NOTE — Progress Notes (Signed)
BP 133/81 (BP Location: Left Arm, Patient Position: Sitting, Cuff Size: Normal)   Pulse 66   Temp 98.1 F (36.7 C)   Ht 5' 7.95" (1.726 m)   Wt 240 lb 6 oz (109 kg)   SpO2 96%   BMI 36.60 kg/m    Subjective:    Patient ID: Grant Diaz, male    DOB: 1951-03-06, 68 y.o.   MRN: [id number]  HPI: Grant Diaz is a 68 y.o. male presenting on 12/24/2018 for comprehensive medical examination. Current medical complaints include:none.  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   He currently lives with: wife Interim Problems from his last visit: no  HYPERTENSION Pt taking amlodipine and lisinopril-hctz for HTN.  Hypertension status: controlled  Satisfied with current treatment? yes Duration of hypertension: chronic BP monitoring frequency:  weekly BP range: 110's/70's - 140's/90's BP medication side effects:  no Medication compliance: good compliance Aspirin: yes Recurrent headaches: no Visual changes: no Palpitations: no Dyspnea: no Chest pain: no Lower extremity edema: no Dizzy/lightheaded: no  GERD Pt taking OTC esomeprazole every other day.  GERD control status: controlled  Satisfied with current treatment? yes Heartburn frequency: 2x per month or less Medication side effects: no  Medication compliance: stable Previous GERD medications: Antacid use frequency: "occasionally" in the evening after a larger meal   Aggravating factors: Larger meals, greasy food Dysphagia: no - pt reports a lap band and food will "back up" with larger meals Odynophagia:  no Hematemesis: no Blood in stool: no EGD: yes - appx 4 years ago  OBESITY Endorses starting weight watchers with >10lb weight loss. Pt reports daily exercise of walking with his dog. Information provided on increased exercise.   SLEEP APNEA Endorses using CPAP every night and tolerating well. Denies insomnia,  restless sleep, daytime sleepiness, depression, or confusion.    Hx PROSTATE CANCER Followed at [place] by Dr. Bernardo Heater for hx of prostate cancer. Reports episode of blood in the urine in May 2019 and again in December 2019. Cystoscopy showed a vessel in the bladder "leaking". Pt reports current plan is to watch closely.  Urology notes review, patient requests PSA in house today and to forward result to Dr. Bernardo Heater.  Hx PULMONARY EMBOLUS Followed by [facility], Dr. Rockey Situ, for hx of pulmonary embolism. Currently taking 81mg  ASA. Endorses wearing compression stockings daily and avoidance of sitting for long periods of time. Denies any current concerns or issues.  Functional Status Survey: Is the patient deaf or have difficulty hearing?: No Does the patient have difficulty seeing, even when wearing glasses/contacts?: No Does the patient have difficulty concentrating, remembering, or making decisions?: No Does the patient have difficulty walking or climbing stairs?: No Does the patient have difficulty dressing or bathing?: No Does the patient have difficulty doing errands alone such as visiting a doctor's office or shopping?: No  FALL RISK: Fall Risk  12/24/2018 09/25/2017  Falls in the past year? 0 No  Number falls in past yr: 0 -  Injury with Fall? 0 -    Depression Screen Depression screen [facility] 2/9 12/24/2018 09/25/2017  Decreased Interest 0 0  Down, Depressed, Hopeless 0 0  PHQ - 2 Score 0 0  Altered sleeping - 0  Tired, decreased energy - 0  Change in appetite - 1  Feeling bad or failure about yourself  - 0  Trouble concentrating -  0  Moving slowly or fidgety/restless - 0  Suicidal thoughts - 0  PHQ-9 Score - 1     Past Medical History:  Past Medical History:  Diagnosis Date  . Arthritis   . 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 Chronic kidney disease    kidney stones  remote history  . GERD (gastroesophageal reflux disease)   . History of DVT (deep  vein thrombosis)   . Hypertension   . Joint pain   . Pancreatitis 2013   [facility]  . Prostate cancer ([place])   . Pulmonary embolus [facility]) March 2016   Bilateral pulmonary embolus, [facility]  . Sleep apnea, obstructive    cpap    Surgical History:  Past Surgical History:  Procedure Laterality Date  . CARPAL TUNNEL RELEASE Right 03/15/2017   Procedure: CARPAL TUNNEL RELEASE;  Surgeon: Hessie Knows, MD;  Location: [facility] ORS;  Service: Orthopedics;  Laterality: Right;  . CHOLECYSTECTOMY    . COLONOSCOPY WITH PROPOFOL N/A 04/02/2018   Procedure: COLONOSCOPY WITH PROPOFOL;  Surgeon: Lucilla Lame, MD;  Location[facility] ENDOSCOPY;  Service: Endoscopy;  Laterality: N/A;  . ESOPHAGOGASTRODUODENOSCOPY N/A 05/28/2013   Procedure: ESOPHAGOGASTRODUODENOSCOPY (EGD);  Surgeon: Shann Medal, MD;  Location: [facility] ENDOSCOPY;  Service: General;  Laterality: N/A;  . GALLBLADDER SURGERY  2003  . GANGLION CYST EXCISION Right 03/15/2017   Procedure: REMOVAL GANGLION OF WRIST;  Surgeon: Hessie Knows, MD;  Location: [facility] ORS;  Service: Orthopedics;  Laterality: Right;  . HERNIA REPAIR    . hose  2011  . KNEE ARTHROSCOPY Bilateral [id number]  . LAPAROSCOPIC GASTRIC BANDING  2009   and hernia repair  . RADIATION IMPLANT W/ ULTRASOUND, MALE  2005   internal/external radiation for prostate cancer  . TOTAL KNEE ARTHROPLASTY Left 10/12/2015   Procedure: TOTAL KNEE ARTHROPLASTY;  Surgeon: Hessie Knows, MD;  Location: [facility] ORS;  Service: Orthopedics;  Laterality: Left;  . TOTAL KNEE ARTHROPLASTY Right 05/15/2017   Procedure: TOTAL KNEE ARTHROPLASTY;  Surgeon: Hessie Knows, MD;  Location: [facility] ORS;  Service: Orthopedics;  Laterality: Right;  . UMBILICAL HERNIA REPAIR  2010    Medications:  Current Outpatient Medications on File Prior to Visit  Medication Sig  . acetaminophen (TYLENOL) 325 MG tablet Take 650 mg by mouth every 6 (six) hours as needed.  Marland Kitchen amLODipine (NORVASC) 10 MG tablet TAKE 1 TABLET BY MOUTH ONCE DAILY  .  aspirin EC 81 MG tablet Take 81 mg by mouth daily.  . celecoxib (CELEBREX) 200 MG capsule Take 200 mg by mouth daily.  . Cholecalciferol (D3-1000) 1000 UNITS capsule Take 1,000 Units by mouth daily.  . ciprofloxacin-hydrocortisone (CIPRO HC OTIC) OTIC suspension Place 3 drops into the left ear 2 (two) times daily. (Patient not taking: Reported on 12/24/2018)  . ketoconazole (NIZORAL) 2 % cream Apply 1 application topically daily as needed.  Marland Kitchen lisinopril-hydrochlorothiazide (PRINZIDE,ZESTORETIC) 20-25 MG tablet Take 1 tablet by mouth daily.  . Multiple Vitamin (MULTIVITAMIN) capsule Take 1 capsule by mouth daily.    . NEOMYCIN-POLYMYXIN-HYDROCORTISONE (CORTISPORIN) 1 % SOLN OTIC solution Place 3 drops into the left ear 4 (four) times daily. (Patient not taking: Reported on 12/24/2018)  . omeprazole (PRILOSEC) 20 MG capsule Take 20 mg by mouth every other day.  . triamcinolone (KENALOG) 0.025 % cream Apply 1 application topically 2 (two) times daily as needed (skin rash). (Patient not taking: Reported on 12/24/2018)  . Turmeric 500 MG CAPS Take 1 capsule by mouth daily.   No current facility-administered medications on  file prior to visit.     Allergies:  Allergies  Allergen Reactions  . Adhesive [Tape] Rash    "whelps", latex adhesive tape only, paper tape is ok     Social History:  Social History   Socioeconomic History  . Marital status: Married    Spouse name: Not on file  . Number of children: Not on file  . Years of education: Not on file  . Highest education level: Not on file  Occupational History  . Not on file  Social Needs  . Financial resource strain: Not on file  . Food insecurity:    Worry: Not on file    Inability: Not on file  . Transportation needs:    Medical: Not on file    Non-medical: Not on file  Tobacco Use  . Smoking status: Never Smoker  . Smokeless tobacco: Never Used  Substance and Sexual Activity  . Alcohol use: No  . Drug use: No  . Sexual activity:  Yes  Lifestyle  . Physical activity:    Days per week: Not on file    Minutes per session: Not on file  . Stress: Not on file  Relationships  . Social connections:    Talks on phone: Not on file    Gets together: Not on file    Attends religious service: Not on file    Active member of club or organization: Not on file    Attends meetings of clubs or organizations: Not on file    Relationship status: Not on file  . Intimate partner violence:    Fear of current or ex partner: Not on file    Emotionally abused: Not on file    Physically abused: Not on file    Forced sexual activity: Not on file  Other Topics Concern  . Not on file  Social History Narrative  . Not on file   Social History   Tobacco Use  Smoking Status Never Smoker  Smokeless Tobacco Never Used   Social History   Substance and Sexual Activity  Alcohol Use No    Family History:  Family History  Problem Relation Age of Onset  . Diabetes Mother   . Parkinson's disease Father   . Hypertension Brother   . Diabetes Son        type 1  . Heart disease Maternal Grandmother        CHF  . Stroke Maternal Grandfather   . Heart disease Paternal Grandmother        CHF  . Stroke Paternal Grandfather     Past medical history, surgical history, medications, allergies, family history and social history reviewed with patient today and changes made to appropriate areas of the chart.   Review of Systems - All other ROS negative except what is listed above and in the HPI.      Objective:    BP 133/81 (BP Location: Left Arm, Patient Position: Sitting, Cuff Size: Normal)   Pulse 66   Temp 98.1 F (36.7 C)   Ht 5' 7.95" (1.726 m)   Wt 240 lb 6 oz (109 kg)   SpO2 96%   BMI 36.60 kg/m   Wt Readings from Last 3 Encounters:  12/24/18 240 lb 6 oz (109 kg)  11/29/18 240 lb (108.9 kg)  11/28/18 246 lb (111.6 kg)    Physical Exam Vitals signs and nursing note reviewed.  Constitutional:      Appearance: He is  well-developed.  HENT:  Head: Normocephalic and atraumatic.     Right Ear: Hearing, tympanic membrane, ear canal and external ear normal. No decreased hearing noted. No drainage.     Left Ear: Hearing, tympanic membrane, ear canal and external ear normal. No decreased hearing noted. No drainage.     Nose: Nose normal.     Right Sinus: No maxillary sinus tenderness or frontal sinus tenderness.     Left Sinus: No maxillary sinus tenderness or frontal sinus tenderness.     Mouth/Throat:     Pharynx: Uvula midline.  Eyes:     General: Lids are normal.        Right eye: No discharge.        Left eye: No discharge.     Conjunctiva/sclera: Conjunctivae normal.     Pupils: Pupils are equal, round, and reactive to light.  Neck:     Musculoskeletal: Normal range of motion and neck supple.     Thyroid: No thyromegaly.     Vascular: No carotid bruit or JVD.     Trachea: Trachea normal.  Cardiovascular:     Rate and Rhythm: Normal rate and regular rhythm.     Pulses: Normal pulses.     Heart sounds: Normal heart sounds, S1 normal and S2 normal. No murmur. No gallop.   Pulmonary:     Effort: Pulmonary effort is normal.     Breath sounds: Normal breath sounds.  Abdominal:     General: Bowel sounds are normal.     Palpations: Abdomen is soft. There is no hepatomegaly or splenomegaly.     Tenderness: There is no abdominal tenderness. There is no guarding.  Musculoskeletal: Normal range of motion.     Right lower leg: No edema.     Left lower leg: No edema.  Lymphadenopathy:     Cervical: No cervical adenopathy.  Skin:    General: Skin is warm and dry.     Capillary Refill: Capillary refill takes less than 2 seconds.     Findings: No rash.  Neurological:     Mental Status: He is alert and oriented to person, place, and time.     Deep Tendon Reflexes: Reflexes are normal and symmetric.     Reflex Scores:      Brachioradialis reflexes are 2+ on the right side and 2+ on the left side.       Patellar reflexes are 2+ on the right side and 2+ on the left side. Psychiatric:        Mood and Affect: Mood normal.        Behavior: Behavior normal.        Thought Content: Thought content normal.        Judgment: Judgment normal.    Results for orders placed or performed in visit on 11/29/18  Microscopic Examination  Result Value Ref Range   WBC, UA None seen 0 - 5 /hpf   RBC, UA 0-2 0 - 2 /hpf   Epithelial Cells (non renal) None seen 0 - 10 /hpf   Bacteria, UA None seen None seen/Few  Urinalysis, Complete  Result Value Ref Range   Specific Gravity, UA 1.020 1.005 - 1.030   pH, UA 7.0 5.0 - 7.5   Color, UA Yellow Yellow   Appearance Ur Clear Clear   Leukocytes, UA Negative Negative   Protein, UA Negative Negative/Trace   Glucose, UA Negative Negative   Ketones, UA Negative Negative   RBC, UA Negative Negative   Bilirubin, UA Negative Negative  Urobilinogen, Ur 0.2 0.2 - 1.0 mg/dL   Nitrite, UA Negative Negative   Microscopic Examination See below:       Assessment & Plan:   Problem List Items Addressed This Visit      Cardiovascular and Mediastinum   Essential hypertension, benign    Chronic, stable. No change to medication regimen. CMP and Lipids today. Follow-up in 6 months.       Relevant Orders   CBC with Differential/Platelet   Comprehensive metabolic panel     Respiratory   Sleep apnea    Chronic, stable. Continue with CPAP qHS.         Other   Obesity (BMI 35.0-39.9 without comorbidity)    Discussed diet and exercise. Recommended continue with weight watchers program and exercise regimen.       History of prostate cancer    PSA today. Continue to collaborate with Dr. Bernardo Heater with care and future needs.       Relevant Orders   PSA   History of pulmonary embolism    Recommended continue daily aspirin. Continue to collaborate with Dr. Rockey Situ for care and future needs.        Other Visit Diagnoses    Annual physical exam    -  Primary    Relevant Orders   TSH   Gastroesophageal reflux disease without esophagitis       Relevant Orders   Magnesium   Screening cholesterol level       Relevant Orders   Lipid Panel w/o Chol/HDL Ratio   Encounter for hepatitis C screening test for low risk patient       Relevant Orders   Hepatitis C antibody       Discussed aspirin prophylaxis for myocardial infarction prevention and decision was made to continue ASA  LABORATORY TESTING:  Health maintenance labs ordered today as discussed above.   IMMUNIZATIONS:   - Tdap: Tetanus vaccination status reviewed: Administered today. - Influenza: Up to date - Pneumovax: Administered today - Prevnar: Up to date - Zostavax vaccine: would like a prescription today.   SCREENING: - Colonoscopy: cologaurd up to date  Discussed with patient purpose of the colonoscopy is to detect colon cancer at curable precancerous or early stages   - AAA Screening: Not applicable  -Hearing Test: Not applicable  -Spirometry: Not applicable   PATIENT COUNSELING:    Sexuality: Discussed sexually transmitted diseases, partner selection, use of condoms, avoidance of unintended pregnancy  and contraceptive alternatives.   Advised to avoid cigarette smoking.  I discussed with the patient that most people either abstain from alcohol or drink within safe limits (<=14/week and <=4 drinks/occasion for males, <=7/weeks and <= 3 drinks/occasion for females) and that the risk for alcohol disorders and other health effects rises proportionally with the number of drinks per week and how often a drinker exceeds daily limits.  Discussed cessation/primary prevention of drug use and availability of treatment for abuse.   Diet: Encouraged to adjust caloric intake to maintain  or achieve ideal body weight, to reduce intake of dietary saturated fat and total fat, to limit sodium intake by avoiding high sodium foods and not adding table salt, and to maintain adequate dietary  potassium and calcium preferably from fresh fruits, vegetables, and low-fat dairy products.    stressed the importance of regular exercise  Injury prevention: Discussed safety belts, safety helmets, smoke detector, smoking near bedding or upholstery.   Dental health: Discussed importance of regular tooth brushing, flossing, and dental visits.  Follow up plan: NEXT PREVENTATIVE PHYSICAL DUE IN 1 YEAR. Return in about 6 months (around 06/26/2019) for HTN & GERD.   NOTE WRITTEN BY [employer] DNP STUDENT.  ASSESSMENT AND PLAN OF CARE REVIEWED WITH STUDENT, AGREE WITH ABOVE FINDINGS AND PLAN.

## 2018-12-24 NOTE — Assessment & Plan Note (Deleted)
Discussed diet and exercise. Recommended continue with weight watchers program and exercise regimen.

## 2018-12-24 NOTE — Assessment & Plan Note (Signed)
Discussed diet and exercise. Recommended continue with weight watchers program and exercise regimen.

## 2018-12-24 NOTE — Assessment & Plan Note (Addendum)
Recommended continue daily aspirin. Continue to collaborate with Dr. Rockey Situ for care and future needs.

## 2018-12-24 NOTE — Patient Instructions (Addendum)
DASH Eating Plan DASH stands for "Dietary Approaches to Stop Hypertension." The DASH eating plan is a healthy eating plan that has been shown to reduce high blood pressure (hypertension). It may also reduce your risk for type 2 diabetes, heart disease, and stroke. The DASH eating plan may also help with weight loss. What are tips for following this plan?  General guidelines  Avoid eating more than 2,300 mg (milligrams) of salt (sodium) a day. If you have hypertension, you may need to reduce your sodium intake to 1,500 mg a day.  Limit alcohol intake to no more than 1 drink a day for nonpregnant women and 2 drinks a day for men. One drink equals 12 oz of beer, 5 oz of wine, or 1 oz of hard liquor.  Work with your health care provider to maintain a healthy body weight or to lose weight. Ask what an ideal weight is for you.  Get at least 30 minutes of exercise that causes your heart to beat faster (aerobic exercise) most days of the week. Activities may include walking, swimming, or biking.  Work with your health care provider or diet and nutrition specialist (dietitian) to adjust your eating plan to your individual calorie needs. Reading food labels   Check food labels for the amount of sodium per serving. Choose foods with less than 5 percent of the Daily Value of sodium. Generally, foods with less than 300 mg of sodium per serving fit into this eating plan.  To find whole grains, look for the word "whole" as the first word in the ingredient list. Shopping  Buy products labeled as "low-sodium" or "no salt added."  Buy fresh foods. Avoid canned foods and premade or frozen meals. Cooking  Avoid adding salt when cooking. Use salt-free seasonings or herbs instead of table salt or sea salt. Check with your health care provider or pharmacist before using salt substitutes.  Do not fry foods. Cook foods using healthy methods such as baking, boiling, grilling, and broiling instead.  Cook with  heart-healthy oils, such as olive, canola, soybean, or sunflower oil. Meal planning  Eat a balanced diet that includes: ? 5 or more servings of fruits and vegetables each day. At each meal, try to fill half of your plate with fruits and vegetables. ? Up to 6-8 servings of whole grains each day. ? Less than 6 oz of lean meat, poultry, or fish each day. A 3-oz serving of meat is about the same size as a deck of cards. One egg equals 1 oz. ? 2 servings of low-fat dairy each day. ? A serving of nuts, seeds, or beans 5 times each week. ? Heart-healthy fats. Healthy fats called Omega-3 fatty acids are found in foods such as flaxseeds and coldwater fish, like sardines, salmon, and mackerel.  Limit how much you eat of the following: ? Canned or prepackaged foods. ? Food that is high in trans fat, such as fried foods. ? Food that is high in saturated fat, such as fatty meat. ? Sweets, desserts, sugary drinks, and other foods with added sugar. ? Full-fat dairy products.  Do not salt foods before eating.  Try to eat at least 2 vegetarian meals each week.  Eat more home-cooked food and less restaurant, buffet, and fast food.  When eating at a restaurant, ask that your food be prepared with less salt or no salt, if possible. What foods are recommended? The items listed may not be a complete list. Talk with your dietitian about   what dietary choices are best for you. Grains Whole-grain or whole-wheat bread. Whole-grain or whole-wheat pasta. Brown rice. Oatmeal. Quinoa. Bulgur. Whole-grain and low-sodium cereals. Pita bread. Low-fat, low-sodium crackers. Whole-wheat flour tortillas. Vegetables Fresh or frozen vegetables (raw, steamed, roasted, or grilled). Low-sodium or reduced-sodium tomato and vegetable juice. Low-sodium or reduced-sodium tomato sauce and tomato paste. Low-sodium or reduced-sodium canned vegetables. Fruits All fresh, dried, or frozen fruit. Canned fruit in natural juice (without  added sugar). Meat and other protein foods Skinless chicken or turkey. Ground chicken or turkey. Pork with fat trimmed off. Fish and seafood. Egg whites. Dried beans, peas, or lentils. Unsalted nuts, nut butters, and seeds. Unsalted canned beans. Lean cuts of beef with fat trimmed off. Low-sodium, lean deli meat. Dairy Low-fat (1%) or fat-free (skim) milk. Fat-free, low-fat, or reduced-fat cheeses. Nonfat, low-sodium ricotta or cottage cheese. Low-fat or nonfat yogurt. Low-fat, low-sodium cheese. Fats and oils Soft margarine without trans fats. Vegetable oil. Low-fat, reduced-fat, or light mayonnaise and salad dressings (reduced-sodium). Canola, safflower, olive, soybean, and sunflower oils. Avocado. Seasoning and other foods Herbs. Spices. Seasoning mixes without salt. Unsalted popcorn and pretzels. Fat-free sweets. What foods are not recommended? The items listed may not be a complete list. Talk with your dietitian about what dietary choices are best for you. Grains Baked goods made with fat, such as croissants, muffins, or some breads. Dry pasta or rice meal packs. Vegetables Creamed or fried vegetables. Vegetables in a cheese sauce. Regular canned vegetables (not low-sodium or reduced-sodium). Regular canned tomato sauce and paste (not low-sodium or reduced-sodium). Regular tomato and vegetable juice (not low-sodium or reduced-sodium). Pickles. Olives. Fruits Canned fruit in a light or heavy syrup. Fried fruit. Fruit in cream or butter sauce. Meat and other protein foods Fatty cuts of meat. Ribs. Fried meat. Bacon. Sausage. Bologna and other processed lunch meats. Salami. Fatback. Hotdogs. Bratwurst. Salted nuts and seeds. Canned beans with added salt. Canned or smoked fish. Whole eggs or egg yolks. Chicken or turkey with skin. Dairy Whole or 2% milk, cream, and half-and-half. Whole or full-fat cream cheese. Whole-fat or sweetened yogurt. Full-fat cheese. Nondairy creamers. Whipped toppings.  Processed cheese and cheese spreads. Fats and oils Butter. Stick margarine. Lard. Shortening. Ghee. Bacon fat. Tropical oils, such as coconut, palm kernel, or palm oil. Seasoning and other foods Salted popcorn and pretzels. Onion salt, garlic salt, seasoned salt, table salt, and sea salt. Worcestershire sauce. Tartar sauce. Barbecue sauce. Teriyaki sauce. Soy sauce, including reduced-sodium. Steak sauce. Canned and packaged gravies. Fish sauce. Oyster sauce. Cocktail sauce. Horseradish that you find on the shelf. Ketchup. Mustard. Meat flavorings and tenderizers. Bouillon cubes. Hot sauce and Tabasco sauce. Premade or packaged marinades. Premade or packaged taco seasonings. Relishes. Regular salad dressings. Where to find more information:  National Heart, Lung, and Blood Institute: www.nhlbi.nih.gov  American Heart Association: www.heart.org Summary  The DASH eating plan is a healthy eating plan that has been shown to reduce high blood pressure (hypertension). It may also reduce your risk for type 2 diabetes, heart disease, and stroke.  With the DASH eating plan, you should limit salt (sodium) intake to 2,300 mg a day. If you have hypertension, you may need to reduce your sodium intake to 1,500 mg a day.  When on the DASH eating plan, aim to eat more fresh fruits and vegetables, whole grains, lean proteins, low-fat dairy, and heart-healthy fats.  Work with your health care provider or diet and nutrition specialist (dietitian) to adjust your eating plan to your   individual calorie needs. This information is not intended to replace advice given to you by your health care provider. Make sure you discuss any questions you have with your health care provider. Document Released: 09/28/2011 Document Revised: 10/02/2016 Document Reviewed: 10/02/2016 Elsevier Interactive Patient Education  2019 [employer].   How to Increase Your Level of Physical Activity  Getting regular physical activity is  important for your overall health and well-being. Most people do not get enough exercise. There are easy ways to increase your level of physical activity, even if you have not been very active in the past or you are just starting out. Why is physical activity important? Physical activity has many short-term and long-term health benefits. Regular exercise can:  Help you lose weight or maintain a healthy weight.  Strengthen your muscles and bones.  Boost your mood and improve self-esteem.  Reduce your risk of certain long-term (chronic) diseases, like heart disease, cancer, and diabetes.  Help you stay capable of walking and moving around (mobile) as you age.  Prevent accidents, such as falls, as you age.  Increase life expectancy. What are the benefits of being physically active on a regular basis? In addition to improving your physical health, being physically active on most days of the week can help you in ways that you may not expect. Benefits of regular physical activity may include:  Feeling good about your body.  Being able to move around more easily and for longer periods of time without getting tired (increased stamina).  Finding new sources of fun and enjoyment.  Meeting new people who share a common interest.  Being able to fight off illness better (enhanced immunity).  Being able to sleep better. What can happen if I am not physically active on a regular basis? Not getting enough physical activity can lead to an unhealthy lifestyle and future health problems. This can increase your chances of:  Becoming overweight or obese.  Becoming sick.  Developing chronic illnesses, like heart disease or diabetes.  Having mental health problems, like depression or anxiety.  Having sleep problems.  Having trouble walking or getting yourself around (reduced mobility).  Injuring yourself in a fall as you get older. What steps can I take to be more physically active?  Check  with your health care provider about how to get started. Ask your health care provider what activities are safe for you.  Start out slowly. Walking or doing some simple chair exercises is a good place to start, especially if you have not been active before or for a long time.  Try to find activities that you enjoy. You are more likely to commit to an exercise routine if it does not feel like a chore.  If you have bone or joint problems, choose low-impact exercises, like walking or swimming.  Include physical activity in your everyday routine.  Invite friends or family members to exercise with you. This also will help you commit to your workout plan.  Set goals that you can work toward.  Aim for at least 150 minutes of moderate-intensity exercise each week. Examples of moderate-intensity exercise include walking or riding a bike. Where to find more information  Centers for Disease Control and Prevention: BowlingGrip.is  President's Council on Fitness, Sports & Nutrition www.http://villegas.org/  ChooseMyPlate: [employer].dk Contact a health care provider if:  You have headaches, muscle aches, or joint pain.  You feel dizzy or light-headed while exercising.  You faint.  You have chest pain while exercising. Summary  Exercise benefits your mind and body at any age, even if you are just starting out.  If you have a chronic illness or have not been active for a while, check with your health care provider before increasing your physical activity.  Choose activities that are safe and enjoyable for you.Ask your health care provider what activities are safe for you.  Start slowly. Tell your health care provider if you have problems as you start to increase your activity level. This information is not intended to replace advice given to you by your health care provider. Make sure you discuss any questions you have with your  health care provider. Document Released: 09/28/2016 Document Revised: 09/28/2016 Document Reviewed: 09/28/2016 Elsevier Interactive Patient Education  2019 [employer].

## 2018-12-24 NOTE — Assessment & Plan Note (Signed)
PSA today. Continue to collaborate with Dr. Bernardo Heater with care and future needs.

## 2018-12-24 NOTE — Assessment & Plan Note (Signed)
Chronic, stable. Continue with CPAP qHS.  

## 2018-12-24 NOTE — Assessment & Plan Note (Signed)
Chronic, stable. No change to medication regimen. CMP and Lipids today. Follow-up in 6 months.

## 2018-12-25 LAB — LIPID PANEL W/O CHOL/HDL RATIO
Cholesterol, Total: 152 mg/dL (ref 100–199)
HDL: 48 mg/dL (ref >39)
LDL Calculated: 96 mg/dL (ref 0–99)
Triglycerides: 39 mg/dL (ref 0–149)
VLDL Cholesterol Cal: 8 mg/dL (ref 5–40)

## 2018-12-25 LAB — CBC WITH DIFFERENTIAL/PLATELET
Basophils Absolute: 0.1 10*3/uL (ref 0.0–0.2)
Basos: 1 %
EOS (ABSOLUTE): 0.5 10*3/uL — AB (ref 0.0–0.4)
Eos: 9 %
Hematocrit: 40.2 % (ref 37.5–51.0)
Hemoglobin: 14.1 g/dL (ref 13.0–17.7)
Immature Grans (Abs): 0 10*3/uL (ref 0.0–0.1)
Immature Granulocytes: 0 %
Lymphocytes Absolute: 0.9 10*3/uL (ref 0.7–3.1)
Lymphs: 16 %
MCH: 31.3 pg (ref 26.6–33.0)
MCHC: 35.1 g/dL (ref 31.5–35.7)
MCV: 89 fL (ref 79–97)
Monocytes Absolute: 0.5 10*3/uL (ref 0.1–0.9)
Monocytes: 10 %
NEUTROS ABS: 3.4 10*3/uL (ref 1.4–7.0)
Neutrophils: 64 %
Platelets: 191 10*3/uL (ref 150–450)
RBC: 4.5 x10E6/uL (ref 4.14–5.80)
RDW: 12.7 % (ref 11.6–15.4)
WBC: 5.3 10*3/uL (ref 3.4–10.8)

## 2018-12-25 LAB — COMPREHENSIVE METABOLIC PANEL
ALBUMIN: 4.2 g/dL (ref 3.8–4.8)
ALT: 15 IU/L (ref 0–44)
AST: 23 IU/L (ref 0–40)
Albumin/Globulin Ratio: 1.8 (ref 1.2–2.2)
Alkaline Phosphatase: 93 IU/L (ref 39–117)
BUN / CREAT RATIO: 20 (ref 10–24)
BUN: 20 mg/dL (ref 8–27)
Bilirubin Total: 0.6 mg/dL (ref 0.0–1.2)
CO2: 23 mmol/L (ref 20–29)
Calcium: 9.4 mg/dL (ref 8.6–10.2)
Chloride: 105 mmol/L (ref 96–106)
Creatinine, Ser: 0.99 mg/dL (ref 0.76–1.27)
GFR calc Af Amer: 89 mL/min/{1.73_m2} (ref >59)
GFR calc non Af Amer: 77 mL/min/{1.73_m2} (ref >59)
Globulin, Total: 2.4 g/dL (ref 1.5–4.5)
Glucose: 80 mg/dL (ref 65–99)
POTASSIUM: 4.2 mmol/L (ref 3.5–5.2)
Sodium: 143 mmol/L (ref 134–144)
Total Protein: 6.6 g/dL (ref 6.0–8.5)

## 2018-12-25 LAB — PSA: Prostate Specific Ag, Serum: <0.1 ng/mL (ref 0.0–4.0)

## 2018-12-25 LAB — HEPATITIS C ANTIBODY: Hep C Virus Ab: <0.1 s/co ratio (ref 0.0–0.9)

## 2018-12-25 LAB — MAGNESIUM: Magnesium: 1.9 mg/dL (ref 1.6–2.3)

## 2018-12-25 LAB — TSH: TSH: 0.923 u[IU]/mL (ref 0.450–4.500)

## 2019-02-17 ENCOUNTER — Encounter: Payer: Self-pay | Admitting: Family Medicine

## 2019-02-17 ENCOUNTER — Ambulatory Visit (INDEPENDENT_AMBULATORY_CARE_PROVIDER_SITE_OTHER): Payer: BC Managed Care – PPO | Admitting: Family Medicine

## 2019-02-17 ENCOUNTER — Other Ambulatory Visit: Payer: Self-pay

## 2019-02-17 VITALS — BP 135/77 | HR 66 | Temp 97.6°F | Wt 238.0 lb

## 2019-02-17 DIAGNOSIS — M79605 Pain in left leg: Secondary | ICD-10-CM | POA: Diagnosis not present

## 2019-02-17 NOTE — Progress Notes (Signed)
BP 135/77   Pulse 66   Temp 97.6 F (36.4 C)   Wt 238 lb (108 kg)   BMI 36.24 kg/m    Subjective:    Patient ID: Grant Diaz, male    DOB: October 08, 1951, 68 y.o.   MRN: [id number]  HPI: Grant Diaz is a 68 y.o. male  Chief Complaint  Patient presents with  . Ankle Pain    left, patient states that he did rest it for several days, after that he started to get cramps in his shin, he states that it comes and goes. Question of blood clot had one 2 years ago " mild leg cramp left shin" just wants to make sure its not a DVT    . This visit was completed via telephone due to the restrictions of the COVID-19 pandemic. All issues as above were discussed and addressed but no physical exam was performed. If it was felt that the patient should be evaluated in the office, they were directed there. The patient verbally consented to this visit. Patient was unable to complete an audio/visual visit due to Technical difficulties,Lack of internet. Due to the catastrophic nature of the COVID-19 pandemic, this visit was done through audio contact only. . Location of the patient: home . Location of the provider: home . Those involved with this call:  . Provider: Merrie Roof, PA-C . CMA: Yvonna Alanis, [place] . Front Desk/Registration: Jill Side  . Time spent on call: 15 minutes on the phone discussing health concerns. 5 minutes total spent in review of patient's record and preparation of their chart. .  Patient presenting with left calf pain that has been intermittent for the past 4 days or so. Started out feeling like a mild leg cramp, does not seem to be triggered by anything in particular. No swelling, redness, numbness, tingling. Does note that about a week ago he mildly twisted the left ankle doing something outside but otherwise no known injury. Elevating the leg and massaging it with mild relief. Patient is concerned as he had a DVT and PE several years ago. Anticoagulation regimen  currently is a baby aspirin.   Relevant past medical, surgical, family and social history reviewed and updated as indicated. Interim medical history since our last visit reviewed. Allergies and medications reviewed and updated.  Review of Systems  Per HPI unless specifically indicated above     Objective:    BP 135/77   Pulse 66   Temp 97.6 F (36.4 C)   Wt 238 lb (108 kg)   BMI 36.24 kg/m   Wt Readings from Last 3 Encounters:  02/17/19 238 lb (108 kg)  12/24/18 240 lb 6 oz (109 kg)  11/29/18 240 lb (108.9 kg)    Physical Exam  Unable to perform PE given patient does not have access to video technology for today's visit  Results for orders placed or performed in visit on 12/24/18  Hepatitis C antibody  Result Value Ref Range   Hep C Virus Ab <0.1 0.0 - 0.9 s/co ratio  CBC with Differential/Platelet  Result Value Ref Range   WBC 5.3 3.4 - 10.8 x10E3/uL   RBC 4.50 4.14 - 5.80 x10E6/uL   Hemoglobin 14.1 13.0 - 17.7 g/dL   Hematocrit 40.2 37.5 - 51.0 %   MCV 89 79 - 97 fL   MCH 31.3 26.6 - 33.0 pg   MCHC 35.1 31.5 - 35.7 g/dL   RDW 12.7 11.6 - 15.4 %   Platelets 191  150 - 450 x10E3/uL   Neutrophils 64 Not Estab. %   Lymphs 16 Not Estab. %   Monocytes 10 Not Estab. %   Eos 9 Not Estab. %   Basos 1 Not Estab. %   Neutrophils Absolute 3.4 1.4 - 7.0 x10E3/uL   Lymphocytes Absolute 0.9 0.7 - 3.1 x10E3/uL   Monocytes Absolute 0.5 0.1 - 0.9 x10E3/uL   EOS (ABSOLUTE) 0.5 (H) 0.0 - 0.4 x10E3/uL   Basophils Absolute 0.1 0.0 - 0.2 x10E3/uL   Immature Granulocytes 0 Not Estab. %   Immature Grans (Abs) 0.0 0.0 - 0.1 x10E3/uL  Comprehensive metabolic panel  Result Value Ref Range   Glucose 80 65 - 99 mg/dL   BUN 20 8 - 27 mg/dL   Creatinine, Ser 0.99 0.76 - 1.27 mg/dL   GFR calc non Af Amer 77 >59 mL/min/1.73   GFR calc Af Amer 89 >59 mL/min/1.73   BUN/Creatinine Ratio 20 10 - 24   Sodium 143 134 - 144 mmol/L   Potassium 4.2 3.5 - 5.2 mmol/L   Chloride 105 96 - 106  mmol/L   CO2 23 20 - 29 mmol/L   Calcium 9.4 8.6 - 10.2 mg/dL   Total Protein 6.6 6.0 - 8.5 g/dL   Albumin 4.2 3.8 - 4.8 g/dL   Globulin, Total 2.4 1.5 - 4.5 g/dL   Albumin/Globulin Ratio 1.8 1.2 - 2.2   Bilirubin Total 0.6 0.0 - 1.2 mg/dL   Alkaline Phosphatase 93 39 - 117 IU/L   AST 23 0 - 40 IU/L   ALT 15 0 - 44 IU/L  Lipid Panel w/o Chol/HDL Ratio  Result Value Ref Range   Cholesterol, Total 152 100 - 199 mg/dL   Triglycerides 39 0 - 149 mg/dL   HDL 48 >39 mg/dL   VLDL Cholesterol Cal 8 5 - 40 mg/dL   LDL Calculated 96 0 - 99 mg/dL  TSH  Result Value Ref Range   TSH 0.923 0.450 - 4.500 uIU/mL  Magnesium  Result Value Ref Range   Magnesium 1.9 1.6 - 2.3 mg/dL  PSA  Result Value Ref Range   Prostate Specific Ag, Serum <0.1 0.0 - 4.0 ng/mL      Assessment & Plan:   Problem List Items Addressed This Visit    None    Visit Diagnoses    Left leg pain    -  Primary    Will schedule for in office evaluation tomorrow morning given hx of DVT and location of pain. Suspect more muscular/tendon injury from twisting the ankle a week ago but feel patient would benefit from a further look. Strict return precautions given for in the meantime if sxs worsen. Warm compresses, stretches, soaks, tylenol recommended.   Follow up plan: Return in about 1 day (around 02/18/2019) for In office with Dr. Wynetta Emery.

## 2019-02-18 ENCOUNTER — Encounter: Payer: Self-pay | Admitting: Family Medicine

## 2019-02-18 ENCOUNTER — Ambulatory Visit (INDEPENDENT_AMBULATORY_CARE_PROVIDER_SITE_OTHER): Payer: BC Managed Care – PPO | Admitting: Family Medicine

## 2019-02-18 VITALS — BP 129/76 | HR 84 | Temp 98.3°F

## 2019-02-18 DIAGNOSIS — I499 Cardiac arrhythmia, unspecified: Secondary | ICD-10-CM

## 2019-02-18 DIAGNOSIS — R252 Cramp and spasm: Secondary | ICD-10-CM | POA: Diagnosis not present

## 2019-02-18 NOTE — Addendum Note (Signed)
Addended by: Valerie Roys on: 02/18/2019 03:18 PM   Modules accepted: Orders

## 2019-02-18 NOTE — Patient Instructions (Signed)

## 2019-02-18 NOTE — Progress Notes (Addendum)
BP 129/76   Pulse 84   Temp 98.3 F (36.8 C) (Oral)   SpO2 98%    Subjective:    Patient ID: Grant Diaz, male    DOB: January 24, 1951, 68 y.o.   MRN: [id number]  HPI: Grant Diaz is a 68 y.o. male  Chief Complaint  Patient presents with  . Muscle Pain    pt states he has been having cramps in his left lower leg for the past 8 days. Satets they come and go. States he did have a PE about 2 years ago.    LEG CRAMPS- tried to get a cat out of tree, twisted his ankle, then walked on it for a while a week ago. Started with L calf cramps only, none on the R have not been getting particularly better. Has a history of unprovoked PE 2 years ago. Does not appear to have had a hypercoagulable work up. Duration: 8 days Pain: yes Severity: moderate  Quality:  cramping Location: L lower leg  Bilateral:  no Onset: sudden Frequency: constant Time of  day:   at random Sudden unintentional leg jerking:   no Paresthesias:   no Decreased sensation:  no Weakness:   no Insomnia:   no Fatigue:   no Status: worse  Relevant past medical, surgical, family and social history reviewed and updated as indicated. Interim medical history since our last visit reviewed. Allergies and medications reviewed and updated.  Review of Systems  Constitutional: Negative.   Respiratory: Negative.   Cardiovascular: Negative.   Musculoskeletal: Positive for myalgias. Negative for arthralgias, back pain, gait problem, joint swelling, neck pain and neck stiffness.  Skin: Negative.   Neurological: Negative.   Psychiatric/Behavioral: Negative.     Per HPI unless specifically indicated above     Objective:    BP 129/76   Pulse 84   Temp 98.3 F (36.8 C) (Oral)   SpO2 98%   Wt Readings from Last 3 Encounters:  02/17/19 238 lb (108 kg)  12/24/18 240 lb 6 oz (109 kg)  11/29/18 240 lb (108.9 kg)    Physical Exam Vitals signs and nursing note reviewed.  Constitutional:      General: He is not in acute  distress.    Appearance: Normal appearance. He is not ill-appearing, toxic-appearing or diaphoretic.  HENT:     Head: Normocephalic and atraumatic.     Right Ear: External ear normal.     Left Ear: External ear normal.     Nose: Nose normal.     Mouth/Throat:     Mouth: Mucous membranes are moist.     Pharynx: Oropharynx is clear.  Eyes:     General: No scleral icterus.       Right eye: No discharge.        Left eye: No discharge.     Extraocular Movements: Extraocular movements intact.     Conjunctiva/sclera: Conjunctivae normal.     Pupils: Pupils are equal, round, and reactive to light.  Neck:     Musculoskeletal: Normal range of motion and neck supple.  Cardiovascular:     Rate and Rhythm: Normal rate. Rhythm irregularly irregular.     Pulses: Normal pulses.     Heart sounds: Normal heart sounds. No murmur. No friction rub. No gallop.   Pulmonary:     Effort: Pulmonary effort is normal. No respiratory distress.     Breath sounds: Normal breath sounds. No stridor. No wheezing, rhonchi or rales.  Chest:  Chest wall: No tenderness.  Musculoskeletal: Normal range of motion.     Comments: Negative squeeze, + homan's, no heat, + varicosities   Skin:    General: Skin is warm and dry.     Capillary Refill: Capillary refill takes less than 2 seconds.     Coloration: Skin is not jaundiced or pale.     Findings: No bruising, erythema, lesion or rash.  Neurological:     General: No focal deficit present.     Mental Status: He is alert and oriented to person, place, and time. Mental status is at baseline.  Psychiatric:        Mood and Affect: Mood normal.        Behavior: Behavior normal.        Thought Content: Thought content normal.        Judgment: Judgment normal.     Results for orders placed or performed in visit on 12/24/18  Hepatitis C antibody  Result Value Ref Range   Hep C Virus Ab <0.1 0.0 - 0.9 s/co ratio  CBC with Differential/Platelet  Result Value Ref  Range   WBC 5.3 3.4 - 10.8 x10E3/uL   RBC 4.50 4.14 - 5.80 x10E6/uL   Hemoglobin 14.1 13.0 - 17.7 g/dL   Hematocrit 40.2 37.5 - 51.0 %   MCV 89 79 - 97 fL   MCH 31.3 26.6 - 33.0 pg   MCHC 35.1 31.5 - 35.7 g/dL   RDW 12.7 11.6 - 15.4 %   Platelets 191 150 - 450 x10E3/uL   Neutrophils 64 Not Estab. %   Lymphs 16 Not Estab. %   Monocytes 10 Not Estab. %   Eos 9 Not Estab. %   Basos 1 Not Estab. %   Neutrophils Absolute 3.4 1.4 - 7.0 x10E3/uL   Lymphocytes Absolute 0.9 0.7 - 3.1 x10E3/uL   Monocytes Absolute 0.5 0.1 - 0.9 x10E3/uL   EOS (ABSOLUTE) 0.5 (H) 0.0 - 0.4 x10E3/uL   Basophils Absolute 0.1 0.0 - 0.2 x10E3/uL   Immature Granulocytes 0 Not Estab. %   Immature Grans (Abs) 0.0 0.0 - 0.1 x10E3/uL  Comprehensive metabolic panel  Result Value Ref Range   Glucose 80 65 - 99 mg/dL   BUN 20 8 - 27 mg/dL   Creatinine, Ser 0.99 0.76 - 1.27 mg/dL   GFR calc non Af Amer 77 >59 mL/min/1.73   GFR calc Af Amer 89 >59 mL/min/1.73   BUN/Creatinine Ratio 20 10 - 24   Sodium 143 134 - 144 mmol/L   Potassium 4.2 3.5 - 5.2 mmol/L   Chloride 105 96 - 106 mmol/L   CO2 23 20 - 29 mmol/L   Calcium 9.4 8.6 - 10.2 mg/dL   Total Protein 6.6 6.0 - 8.5 g/dL   Albumin 4.2 3.8 - 4.8 g/dL   Globulin, Total 2.4 1.5 - 4.5 g/dL   Albumin/Globulin Ratio 1.8 1.2 - 2.2   Bilirubin Total 0.6 0.0 - 1.2 mg/dL   Alkaline Phosphatase 93 39 - 117 IU/L   AST 23 0 - 40 IU/L   ALT 15 0 - 44 IU/L  Lipid Panel w/o Chol/HDL Ratio  Result Value Ref Range   Cholesterol, Total 152 100 - 199 mg/dL   Triglycerides 39 0 - 149 mg/dL   HDL 48 >39 mg/dL   VLDL Cholesterol Cal 8 5 - 40 mg/dL   LDL Calculated 96 0 - 99 mg/dL  TSH  Result Value Ref Range   TSH 0.923 0.450 - 4.500  uIU/mL  Magnesium  Result Value Ref Range   Magnesium 1.9 1.6 - 2.3 mg/dL  PSA  Result Value Ref Range   Prostate Specific Ag, Serum <0.1 0.0 - 4.0 ng/mL      Assessment & Plan:   Problem List Items Addressed This Visit    None     Visit Diagnoses    Leg cramp    -  Primary   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 Will check labs and D-dimer. If high, will get [place]. Await results.    Relevant Orders   D-Dimer, Quantitative   Magnesium   Phosphorus   Comprehensive metabolic panel   Irregular heart beat       PVCs on EKG. Follows with cardiology. No sign of a. fib today. Call with any concerns.    Relevant Orders   EKG 12-Lead (Completed)       Follow up plan: Return Pending results.  >25 minutes spent with patient today >50% spent in counseling and coordination of care today.

## 2019-02-19 ENCOUNTER — Telehealth: Payer: Self-pay | Admitting: Family Medicine

## 2019-02-19 ENCOUNTER — Other Ambulatory Visit: Payer: Self-pay

## 2019-02-19 ENCOUNTER — Ambulatory Visit (HOSPITAL_COMMUNITY)
Admission: RE | Admit: 2019-02-19 | Discharge: 2019-02-19 | Disposition: A | Discharge status: Home / Self Care | Payer: BC Managed Care – PPO | Source: Ambulatory Visit | Attending: Family Medicine | Admitting: Family Medicine

## 2019-02-19 DIAGNOSIS — R252 Cramp and spasm: Secondary | ICD-10-CM | POA: Diagnosis present

## 2019-02-19 LAB — COMPREHENSIVE METABOLIC PANEL
ALT: 13 IU/L (ref 0–44)
AST: 21 IU/L (ref 0–40)
Albumin/Globulin Ratio: 1.6 (ref 1.2–2.2)
Albumin: 3.9 g/dL (ref 3.8–4.8)
Alkaline Phosphatase: 57 IU/L (ref 39–117)
BUN/Creatinine Ratio: 22 (ref 10–24)
BUN: 21 mg/dL (ref 8–27)
Bilirubin Total: 0.3 mg/dL (ref 0.0–1.2)
CO2: 24 mmol/L (ref 20–29)
Calcium: 9.5 mg/dL (ref 8.6–10.2)
Chloride: 104 mmol/L (ref 96–106)
Creatinine, Ser: 0.95 mg/dL (ref 0.76–1.27)
GFR calc Af Amer: 94 mL/min/{1.73_m2} (ref >59)
GFR calc non Af Amer: 81 mL/min/{1.73_m2} (ref >59)
Globulin, Total: 2.5 g/dL (ref 1.5–4.5)
Glucose: 98 mg/dL (ref 65–99)
Potassium: 3.8 mmol/L (ref 3.5–5.2)
Sodium: 143 mmol/L (ref 134–144)
Total Protein: 6.4 g/dL (ref 6.0–8.5)

## 2019-02-19 LAB — D-DIMER, QUANTITATIVE: D-DIMER: 0.8 mg/L FEU — ABNORMAL HIGH (ref 0.00–0.49)

## 2019-02-19 LAB — PHOSPHORUS: Phosphorus: 2.5 mg/dL — ABNORMAL LOW (ref 2.8–4.1)

## 2019-02-19 LAB — MAGNESIUM: Magnesium: 2 mg/dL (ref 1.6–2.3)

## 2019-02-19 NOTE — Telephone Encounter (Signed)
Please let patient know that his d-dimer was slightly elevated, so we're going to get an [place] to make sure he doesn't have a clot. They should be calling him shortly. His other blood work looked good, but his phosphorus was a little low. He can bring that up by eating chicken or [place] or pork. Thanks!

## 2019-02-19 NOTE — Telephone Encounter (Signed)
Please let him know that he does NOT have a DVT and he is free to leave imaging. He should increase his phosphorus and let us know if he's not feeling better. Thanks!

## 2019-02-19 NOTE — Telephone Encounter (Signed)
Message relayed to patient. Verbalized understanding and denied questions.   

## 2019-02-19 NOTE — Telephone Encounter (Signed)
Patient notified and verbalized understanding. 

## 2019-03-03 ENCOUNTER — Other Ambulatory Visit: Payer: Self-pay | Admitting: Unknown Physician Specialty

## 2019-03-03 NOTE — Telephone Encounter (Signed)
Requested medication (s) are due for refill today -yes  Requested medication (s) are on the active medication list -yes  Future visit scheduled - yes  Last refill: 1 year  Notes to clinic: Patient is current with visits and meets criteria for refill- please refill under current PCP for correct provider. ( former Psychologist, educational)  Requested Prescriptions  Pending Prescriptions Disp Refills   lisinopril-hydrochlorothiazide (ZESTORETIC) 20-25 MG tablet [Pharmacy Med Name: Lisinopril-hydroCHLOROthiazide 20-25 MG Oral Tablet] 90 tablet 0    Sig: Take 1 tablet by mouth once daily     Cardiovascular:  ACEI + Diuretic Combos Passed - 03/03/2019  7:06 AM      Passed - Na in normal range and within 180 days    Sodium  Date Value Ref Range Status  02/18/2019 143 134 - 144 mmol/L Final         Passed - K in normal range and within 180 days    Potassium  Date Value Ref Range Status  02/18/2019 3.8 3.5 - 5.2 mmol/L Final         Passed - Cr in normal range and within 180 days    Creatinine, Ser  Date Value Ref Range Status  02/18/2019 0.95 0.76 - 1.27 mg/dL Final         Passed - Ca in normal range and within 180 days    Calcium  Date Value Ref Range Status  02/18/2019 9.5 8.6 - 10.2 mg/dL Final         Passed - Patient is not pregnant      Passed - Last BP in normal range    BP Readings from Last 1 Encounters:  02/18/19 129/76         Passed - Valid encounter within last 6 months    Recent Outpatient Visits          1 week ago Leg cramp   [place], [place] P, DO   2 weeks ago Left leg pain   [facility] Volney [place], [place]   2 months ago Annual physical exam   [place], [place] T, NP   3 months ago Need for influenza vaccination   [place], Jolene T, NP   10 months ago Irregular heart rate   [facility] Kathrine Haddock, NP      Future Appointments            In 3 months  [place]ann[place], Barbaraann Faster, NP [employer], [facility]   In 9 months Stoioff, Ronda Fairly, MD [facility]            Requested Prescriptions  Pending Prescriptions Disp Refills   lisinopril-hydrochlorothiazide (ZESTORETIC) 20-25 MG tablet [Pharmacy Med Name: Lisinopril-hydroCHLOROthiazide 20-25 MG Oral Tablet] 90 tablet 0    Sig: Take 1 tablet by mouth once daily     Cardiovascular:  ACEI + Diuretic Combos Passed - 03/03/2019  7:06 AM      Passed - Na in normal range and within 180 days    Sodium  Date Value Ref Range Status  02/18/2019 143 134 - 144 mmol/L Final         Passed - K in normal range and within 180 days    Potassium  Date Value Ref Range Status  02/18/2019 3.8 3.5 - 5.2 mmol/L Final         Passed - Cr in normal range and within 180 days    Creatinine, Ser  Date Value Ref Range Status  02/18/2019 0.95 0.76 - 1.27 mg/dL Final         Passed - Ca in normal range and within 180 days    Calcium  Date Value Ref Range Status  02/18/2019 9.5 8.6 - 10.2 mg/dL Final         Passed - Patient is not pregnant      Passed - Last BP in normal range    BP Readings from Last 1 Encounters:  02/18/19 129/76         Passed - Valid encounter within last 6 months    Recent Outpatient Visits          1 week ago Leg cramp   [place], [place] P, DO   2 weeks ago Left leg pain   [facility] Volney [place], [place]   2 months ago Annual physical exam   [place], Henrine Screws T, NP   3 months ago Need for influenza vaccination   [facility] [place], Jolene T, NP   10 months ago Irregular heart rate   [facility] Kathrine Haddock, NP      Future Appointments            In 3 months Cannady, Barbaraann Faster, NP [employer], [place]   In 9 months Stoioff, Ronda Fairly, MD [facility]

## 2019-03-04 ENCOUNTER — Ambulatory Visit: Payer: Self-pay | Admitting: Nurse Practitioner

## 2019-03-23 ENCOUNTER — Other Ambulatory Visit: Payer: Self-pay | Admitting: Unknown Physician Specialty

## 2019-04-01 IMAGING — CT CT ABD-PEL WO/W CM
3 of 12 series · 11 of 46 positions shown, 17 images · IV contrast (iopamidol)
Comparison: CT urogram 09/03/2009.  Abdominal MRI 09/27/2009.

CLINICAL DATA: Episodic gross hematuria 2 months ago. No current
complaints. History of kidney stones, pancreatitis, pulmonary
embolism and prostate cancer.

EXAM:
CT ABDOMEN AND PELVIS WITHOUT AND WITH CONTRAST
TECHNIQUE: Multidetector CT imaging of the abdomen and pelvis was performed
following the standard protocol before and following the bolus
administration of intravenous contrast.
CONTRAST:  125mL UKR3HI-KCC IOPAMIDOL (UKR3HI-KCC) INJECTION 61%

[Series 3: without pre · axial · non-contrast · 0.85mm/px · z∈[-1502,-1432]mm · 2 of 99 slices shown]
[im 15/99  soft-tissue]
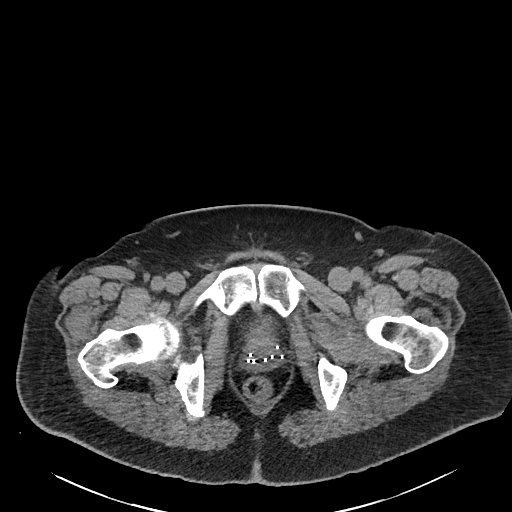
[im 29/99  soft-tissue]
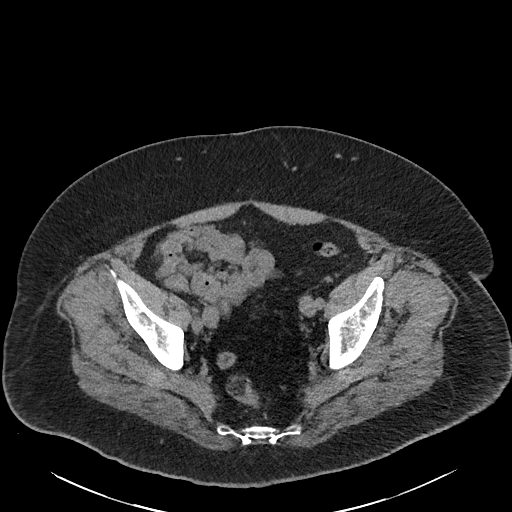

[Series 6: cor without without pre · coronal · non-contrast · 0.85mm/px · 2 of 179 slices shown, 3 images]
[im 60/179  soft-tissue]
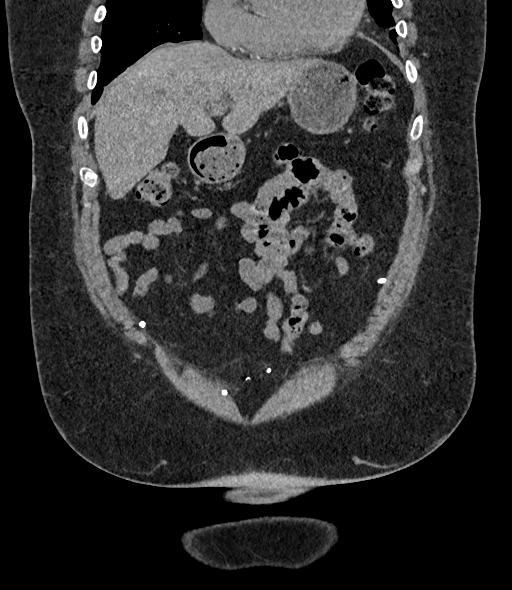
[im 60/179  bone]
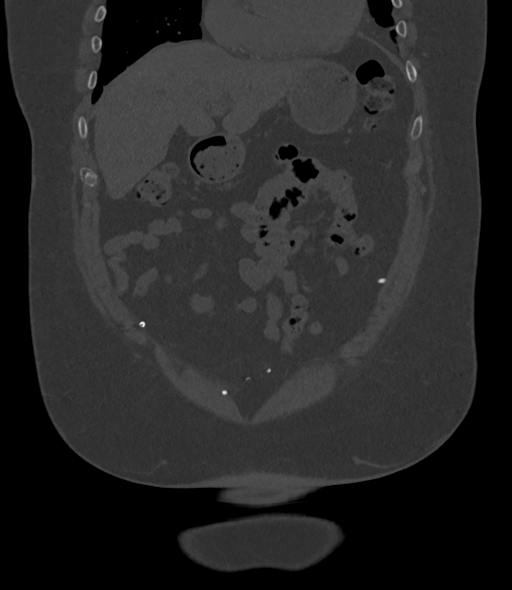
[im 119/179  soft-tissue]
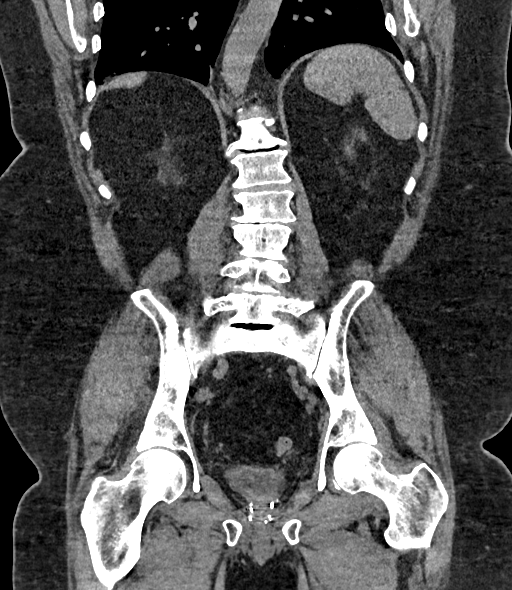

[Series 17: axial delay delay prone · axial · delayed · 0.93mm/px · z∈[-1579,-1189]mm · 7 of 105 slices shown, 12 images]
[im 14/105  soft-tissue]
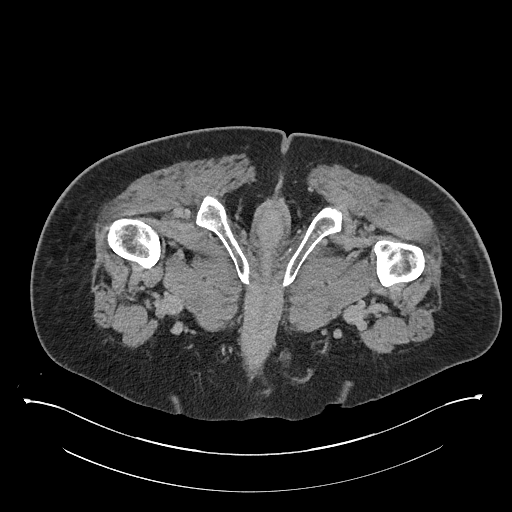
[im 14/105  bone]
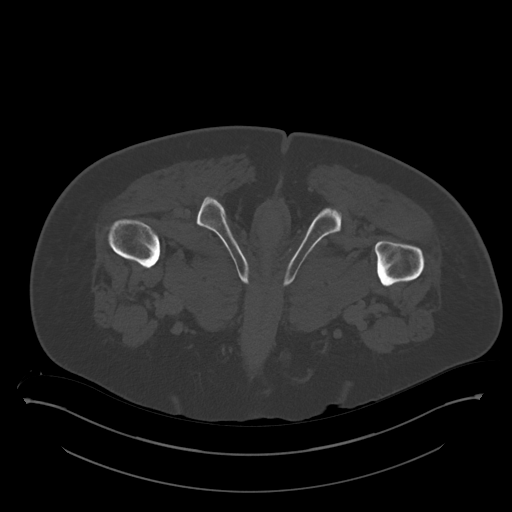
[im 27/105  soft-tissue]
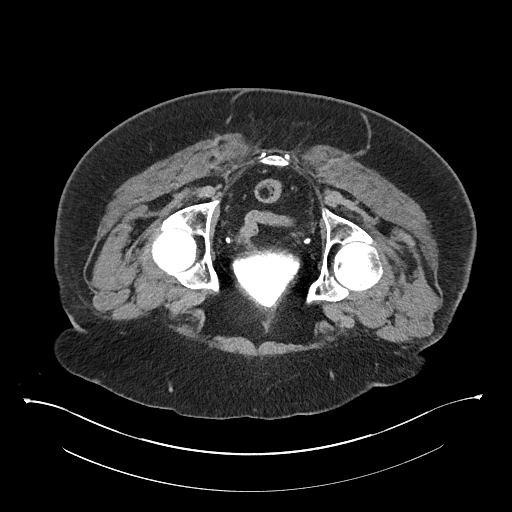
[im 40/105  soft-tissue]
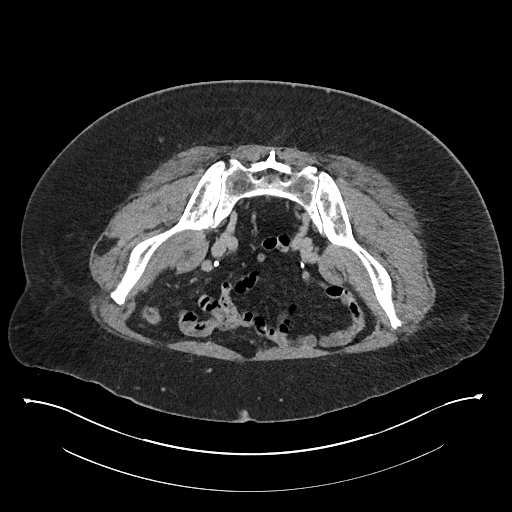
[im 53/105  soft-tissue]
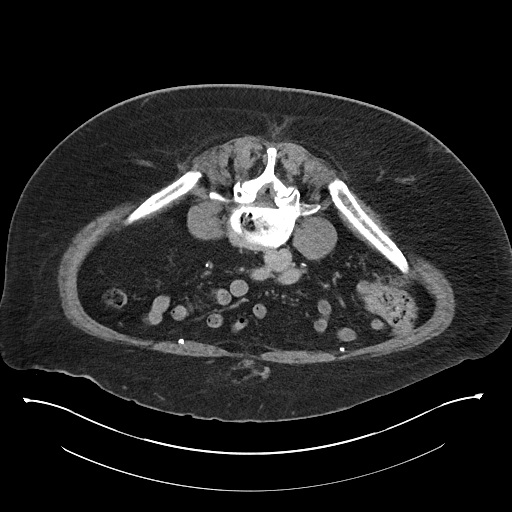
[im 53/105  lung]
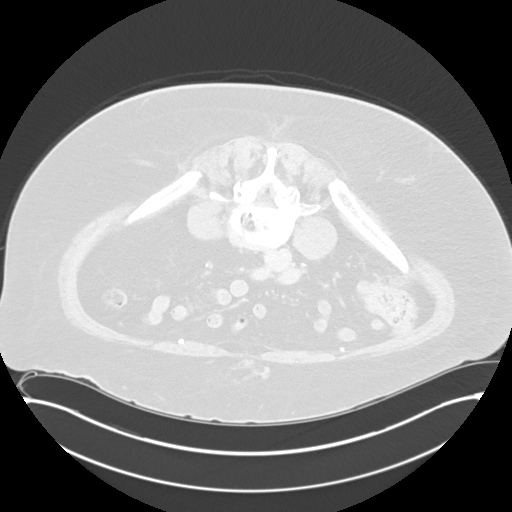
[im 66/105  soft-tissue]
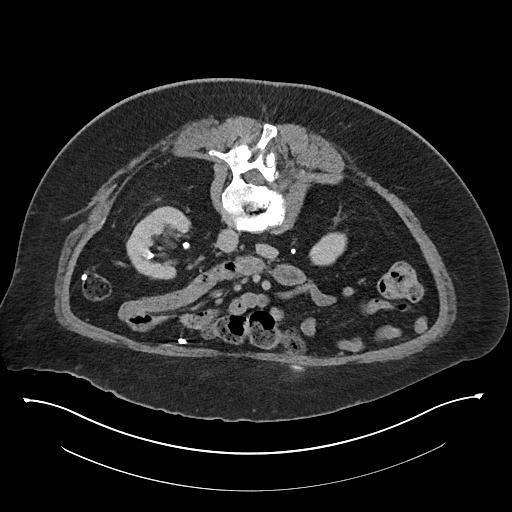
[im 66/105  lung]
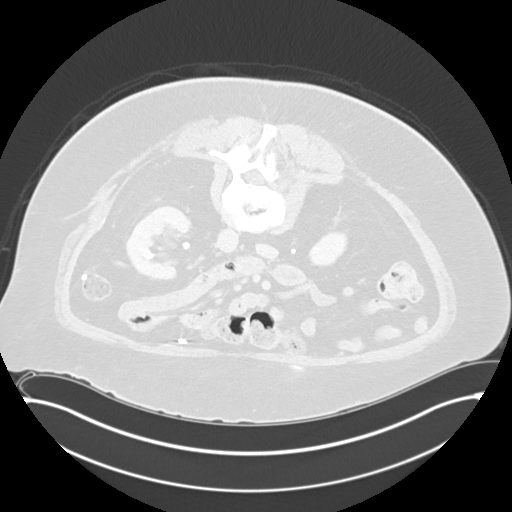
[im 79/105  soft-tissue]
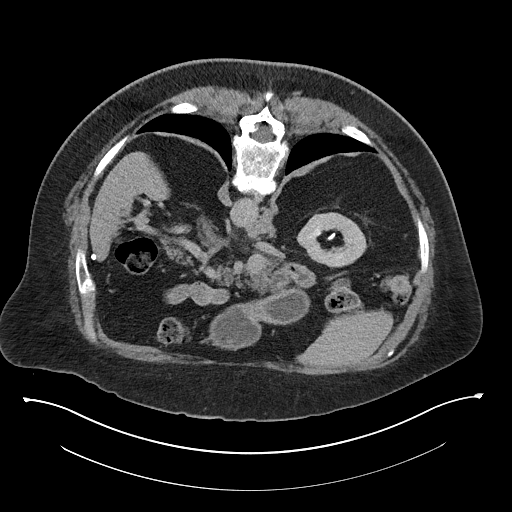
[im 79/105  lung]
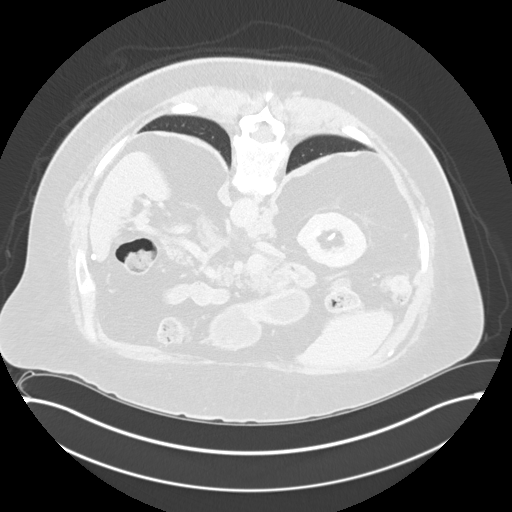
[im 92/105  soft-tissue]
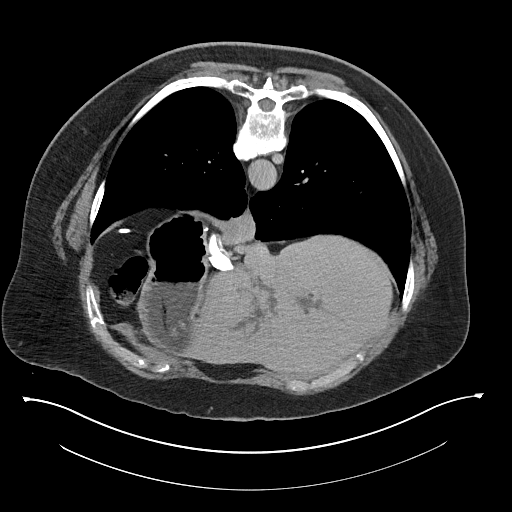
[im 92/105  lung]
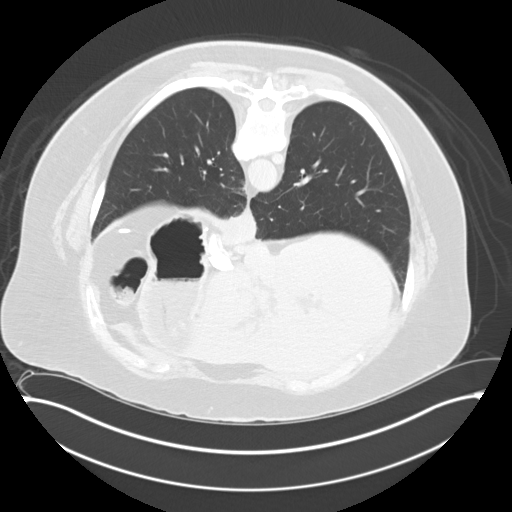

[11 of 46 positions shown; findings below may reference images not displayed]

FINDINGS: Lower chest: Mild perifissural nodularity along the right minor
fissure, most obvious on the sagittal images, likely due to
incidental lymph nodes. There is a small calcified right lower lobe
granuloma. Mild atelectasis is present at both lung bases, and there
is a small hiatal hernia and mild coronary artery atherosclerosis.

Hepatobiliary: No focal hepatic abnormalities are identified. There
is stable mild intra and extrahepatic biliary dilatation status post
cholecystectomy.

Pancreas: Unremarkable. No pancreatic ductal dilatation or
surrounding inflammatory changes.

Spleen: Normal in size without focal abnormality.

Adrenals/Urinary Tract: The adrenal glands appear stable.
Pre-contrast images demonstrate no renal, ureteral or bladder
calculi. Post-contrast, both kidneys enhance normally. There is no
evidence of enhancing renal mass. There are small renal sinus cysts
bilaterally. Delayed images result in segmental visualization of the
ureters. No focal upper tract urothelial abnormalities are
identified. The bladder appears unremarkable.

Stomach/Bowel: No evidence of bowel wall thickening, distention or
surrounding inflammatory change. Gastric lap band appears stable in
position. The appendix appears normal. There is stool throughout the
colon and mild sigmoid diverticulosis.

Vascular/Lymphatic: There are no enlarged abdominal or pelvic lymph
nodes. No acute vascular findings. There is a retroaortic left renal
vein.

Reproductive: Prostate brachytherapy seeds are present. No evidence
of pelvic mass.

Other: Previous ventral hernia repair. There is a stable small
supraumbilical hernia containing only fat.

Musculoskeletal: No acute osseous findings. There is multilevel
spondylosis. There is a left-sided pars defect at L5 with a
resulting grade 1 anterolisthesis and mild foraminal narrowing
bilaterally at L5-S1. There is also a defect in the right L5
articulating facet which appears chronic, likely an atypical pars
defect.
IMPRESSION: 1. No explanation for hematuria identified. No evidence of urinary
tract calculus, renal mass or urothelial lesion.
2. Bilateral renal sinus cysts.
3. Stable biliary dilatation post cholecystectomy, likely
physiologic.
4. No evidence of metastatic prostate cancer.
5. Lumbar spondylosis and bilateral L5 pars defects.

## 2019-06-23 ENCOUNTER — Other Ambulatory Visit: Payer: Self-pay | Admitting: Unknown Physician Specialty

## 2019-06-27 ENCOUNTER — Ambulatory Visit: Payer: BC Managed Care – PPO | Admitting: Nurse Practitioner

## 2019-07-08 ENCOUNTER — Ambulatory Visit: Payer: BC Managed Care – PPO | Admitting: Nurse Practitioner

## 2019-07-10 ENCOUNTER — Ambulatory Visit: Payer: BC Managed Care – PPO | Admitting: Nurse Practitioner

## 2019-07-18 ENCOUNTER — Ambulatory Visit: Payer: Medicare Other | Admitting: Nurse Practitioner

## 2019-07-18 ENCOUNTER — Encounter: Payer: Self-pay | Admitting: Nurse Practitioner

## 2019-07-18 ENCOUNTER — Other Ambulatory Visit: Payer: Self-pay

## 2019-07-18 VITALS — BP 128/85 | HR 76 | Temp 98.1°F | Ht 68.0 in | Wt 245.0 lb

## 2019-07-18 DIAGNOSIS — K2270 Barrett's esophagus without dysplasia: Secondary | ICD-10-CM | POA: Insufficient documentation

## 2019-07-18 DIAGNOSIS — K219 Gastro-esophageal reflux disease without esophagitis: Secondary | ICD-10-CM | POA: Diagnosis not present

## 2019-07-18 DIAGNOSIS — I10 Essential (primary) hypertension: Secondary | ICD-10-CM

## 2019-07-18 MED ORDER — KETOCONAZOLE 2 % EX CREA: 1.0000 "application " | TOPICAL_CREAM | Freq: Every day | CUTANEOUS | 3 refills | Status: DC | PRN

## 2019-07-18 MED ORDER — TRIAMCINOLONE ACETONIDE 0.025 % EX CREA: 1.0000 "application " | TOPICAL_CREAM | Freq: Two times a day (BID) | CUTANEOUS | 2 refills | Status: DC | PRN

## 2019-07-18 NOTE — Assessment & Plan Note (Signed)
Chronic, stable. No change to medication regimen. CMP today. Follow-up in 6 months.

## 2019-07-18 NOTE — Assessment & Plan Note (Signed)
Chronic, stable on Prilosec.  Continue current medication regimen and consider reduction in future.  Return in 6 months.

## 2019-07-18 NOTE — Patient Instructions (Signed)

## 2019-07-18 NOTE — Progress Notes (Signed)
BP 128/85   Pulse 76   Temp 98.1 F (36.7 C) (Oral)   Ht 5\' 8"  (1.727 m)   Wt 245 lb (111.1 kg)   SpO2 98%   BMI 37.25 kg/m    Subjective:    Patient ID: Grant Diaz, male    DOB: [birth year], 68 y.o.   MRN: [id number]  HPI: Grant Diaz is a 68 y.o. male  Chief Complaint  Patient presents with  . Hypertension  . Gastroesophageal Reflux  . Paperwork    DMV for school bus driver   HYPERTENSION Continues on Amlodipine, Lisinopril-HCTZ, and ASA.  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   Hypertension status: stable  Satisfied with current treatment? yes Duration of hypertension: chronic BP monitoring frequency:  daily BP range: 120-130/80's BP medication side effects:  no Medication compliance: good compliance Aspirin: yes Recurrent headaches: no Visual changes: no Palpitations: no Dyspnea: no Chest pain: no Lower extremity edema: no Dizzy/lightheaded: no   GERD Continues on Omeprazole. GERD control status: stable  Satisfied with current treatment? yes Heartburn frequency:  Medication side effects: no  Dysphagia: no Odynophagia:  no Hematemesis: no Blood in stool: no EGD: no  Relevant past medical, surgical, family and social history reviewed and updated as indicated. Interim medical history since our last visit reviewed. Allergies and medications reviewed and updated.  Review of Systems  Constitutional: Negative for activity change, diaphoresis, fatigue and fever.  Respiratory: Negative for cough, chest tightness, shortness of breath and wheezing.   Cardiovascular: Negative for chest pain, palpitations and leg swelling.  Gastrointestinal: Negative for abdominal distention, abdominal pain, constipation, diarrhea, nausea and vomiting.  Neurological: Negative for dizziness, syncope, weakness, light-headedness, numbness and headaches.  Psychiatric/Behavioral: Negative.     Per HPI unless specifically indicated above     Objective:     BP 128/85   Pulse 76   Temp 98.1 F (36.7 C) (Oral)   Ht 5\' 8"  (1.727 m)   Wt 245 lb (111.1 kg)   SpO2 98%   BMI 37.25 kg/m   Wt Readings from Last 3 Encounters:  07/18/19 245 lb (111.1 kg)  02/17/19 238 lb (108 kg)  12/24/18 240 lb 6 oz (109 kg)    Physical Exam Vitals signs and nursing note reviewed.  Constitutional:      General: He is awake. He is not in acute distress.    Appearance: He is well-developed. He is obese. He is not ill-appearing.  HENT:     Head: Normocephalic and atraumatic.     Right Ear: Hearing normal. No drainage.     Left Ear: Hearing normal. No drainage.  Eyes:     General: Lids are normal.        Right eye: No discharge.        Left eye: No discharge.     Conjunctiva/sclera: Conjunctivae normal.     Pupils: Pupils are equal, round, and reactive to light.  Neck:     Musculoskeletal: Normal range of motion and neck supple.     Vascular: No carotid bruit.  Cardiovascular:     Rate and Rhythm: Normal rate and regular rhythm.     Heart sounds: Normal heart sounds, S1 normal and S2 normal. No murmur. No gallop.   Pulmonary:     Effort: Pulmonary effort is normal. No accessory muscle usage or respiratory distress.     Breath sounds: Normal breath sounds.  Abdominal:     General: Bowel sounds  are normal.     Palpations: Abdomen is soft.  Musculoskeletal: Normal range of motion.     Right lower leg: No edema.     Left lower leg: No edema.  Skin:    General: Skin is warm and dry.  Neurological:     Mental Status: He is alert and oriented to person, place, and time.  Psychiatric:        Mood and Affect: Mood normal.        Behavior: Behavior normal. Behavior is cooperative.        Thought Content: Thought content normal.        Judgment: Judgment normal.     Results for orders placed or performed in visit on 02/18/19  D-Dimer, Quantitative  Result Value Ref Range   D-DIMER 0.80 (H) 0.00 - 0.49 mg/L FEU  Magnesium  Result Value Ref Range    Magnesium 2.0 1.6 - 2.3 mg/dL  Phosphorus  Result Value Ref Range   Phosphorus 2.5 (L) 2.8 - 4.1 mg/dL  Comprehensive metabolic panel  Result Value Ref Range   Glucose 98 65 - 99 mg/dL   BUN 21 8 - 27 mg/dL   Creatinine, Ser 0.95 0.76 - 1.27 mg/dL   GFR calc non Af Amer 81 >59 mL/min/1.73   GFR calc Af Amer 94 >59 mL/min/1.73   BUN/Creatinine Ratio 22 10 - 24   Sodium 143 134 - 144 mmol/L   Potassium 3.8 3.5 - 5.2 mmol/L   Chloride 104 96 - 106 mmol/L   CO2 24 20 - 29 mmol/L   Calcium 9.5 8.6 - 10.2 mg/dL   Total Protein 6.4 6.0 - 8.5 g/dL   Albumin 3.9 3.8 - 4.8 g/dL   Globulin, Total 2.5 1.5 - 4.5 g/dL   Albumin/Globulin Ratio 1.6 1.2 - 2.2   Bilirubin Total 0.3 0.0 - 1.2 mg/dL   Alkaline Phosphatase 57 39 - 117 IU/L   AST 21 0 - 40 IU/L   ALT 13 0 - 44 IU/L      Assessment & Plan:   Problem List Items Addressed This Visit      Cardiovascular and Mediastinum   Essential hypertension, benign - Primary    Chronic, stable. No change to medication regimen. CMP today. Follow-up in 6 months.       Relevant Orders   Comprehensive metabolic panel     Digestive   GERD (gastroesophageal reflux disease)    Chronic, stable on Prilosec.  Continue current medication regimen and consider reduction in future.  Return in 6 months.      Relevant Medications   MAGNESIUM OXIDE PO       Follow up plan: Return in about 6 months (around 01/15/2020) for HTN/HLD, GERD, OSA.

## 2019-07-19 LAB — COMPREHENSIVE METABOLIC PANEL
ALT: 18 IU/L (ref 0–44)
AST: 21 IU/L (ref 0–40)
Albumin/Globulin Ratio: 1.7 (ref 1.2–2.2)
Albumin: 4.2 g/dL (ref 3.8–4.8)
Alkaline Phosphatase: 88 IU/L (ref 39–117)
BUN/Creatinine Ratio: 20 (ref 10–24)
BUN: 21 mg/dL (ref 8–27)
Bilirubin Total: 0.4 mg/dL (ref 0.0–1.2)
CO2: 25 mmol/L (ref 20–29)
Calcium: 9.2 mg/dL (ref 8.6–10.2)
Chloride: 102 mmol/L (ref 96–106)
Creatinine, Ser: 1.05 mg/dL (ref 0.76–1.27)
GFR calc Af Amer: 83 mL/min/{1.73_m2} (ref >59)
GFR calc non Af Amer: 72 mL/min/{1.73_m2} (ref >59)
Globulin, Total: 2.5 g/dL (ref 1.5–4.5)
Glucose: 83 mg/dL (ref 65–99)
Potassium: 3.6 mmol/L (ref 3.5–5.2)
Sodium: 141 mmol/L (ref 134–144)
Total Protein: 6.7 g/dL (ref 6.0–8.5)

## 2019-07-20 NOTE — Progress Notes (Signed)
Normal test results noted.  Please call patient and make them aware of normal results and will continue to monitor at regular visits.  Have a great day.  Look forward to seeing you at your next visit.

## 2019-08-28 ENCOUNTER — Telehealth: Payer: Self-pay | Admitting: Nurse Practitioner

## 2019-08-28 ENCOUNTER — Other Ambulatory Visit: Payer: Self-pay | Admitting: Nurse Practitioner

## 2019-08-28 DIAGNOSIS — M79671 Pain in right foot: Secondary | ICD-10-CM

## 2019-08-28 DIAGNOSIS — M79672 Pain in left foot: Secondary | ICD-10-CM

## 2019-08-28 NOTE — Telephone Encounter (Signed)
Placed referral as has been seen recently in office.

## 2019-08-28 NOTE — Progress Notes (Signed)
Patient has been seen in office for leg cramps and ankle pain

## 2019-08-28 NOTE — Telephone Encounter (Signed)
Called pt advised that referral has been placed. Pt verbalized understanding.

## 2019-08-28 NOTE — Telephone Encounter (Signed)
Pt called in to request a referral to Dr. Samara Deist at [facility]. Pt says that provider seen him for his ankles in the past, pt says that he called them to schedule but was told to have a referral placed.

## 2019-09-29 ENCOUNTER — Other Ambulatory Visit: Payer: Self-pay | Admitting: Unknown Physician Specialty

## 2019-11-25 ENCOUNTER — Other Ambulatory Visit: Payer: Self-pay | Admitting: Nurse Practitioner

## 2019-11-25 NOTE — Telephone Encounter (Signed)
Requested Prescriptions  Pending Prescriptions Disp Refills  . lisinopril-hydrochlorothiazide (ZESTORETIC) 20-25 MG tablet [Pharmacy Med Name: Lisinopril-hydroCHLOROthiazide 20-25 MG Oral Tablet] 90 tablet 0    Sig: Take 1 tablet by mouth once daily     Cardiovascular:  ACEI + Diuretic Combos Passed - 11/25/2019  2:40 PM      Passed - Na in normal range and within 180 days    Sodium  Date Value Ref Range Status  07/18/2019 141 134 - 144 mmol/L Final         Passed - K in normal range and within 180 days    Potassium  Date Value Ref Range Status  07/18/2019 3.6 3.5 - 5.2 mmol/L Final         Passed - Cr in normal range and within 180 days    Creatinine, Ser  Date Value Ref Range Status  07/18/2019 1.05 0.76 - 1.27 mg/dL Final         Passed - Ca in normal range and within 180 days    Calcium  Date Value Ref Range Status  07/18/2019 9.2 8.6 - 10.2 mg/dL Final         Passed - Patient is not pregnant      Passed - Last BP in normal range    BP Readings from Last 1 Encounters:  07/18/19 128/85         Passed - Valid encounter within last 6 months    Recent Outpatient Visits          4 months ago Essential hypertension, benign   [place], [place] T, NP   9 months ago Leg cramp   [place], [place], DO   9 months ago Left leg pain   [facility] Mer[place] Roof [place], [place]   11 months ago Annual physical exam   [place] Cannady, Henrine Screws T, NP   12 months ago Need for influenza vaccination   [place], Barbaraann Faster, NP      Future Appointments            In 1 week Stoioff, Ronda Fairly, MD [place]

## 2019-12-05 ENCOUNTER — Other Ambulatory Visit: Payer: Self-pay

## 2019-12-05 ENCOUNTER — Ambulatory Visit: Payer: Medicare PPO | Admitting: Urology

## 2019-12-05 ENCOUNTER — Encounter: Payer: Self-pay | Admitting: Urology

## 2019-12-05 VITALS — BP 157/79 | HR 88 | Ht 69.0 in | Wt 253.0 lb

## 2019-12-05 DIAGNOSIS — Z8546 Personal history of malignant neoplasm of prostate: Secondary | ICD-10-CM | POA: Diagnosis not present

## 2019-12-05 DIAGNOSIS — R310 Gross hematuria: Secondary | ICD-10-CM

## 2019-12-05 LAB — URINALYSIS, COMPLETE
Bilirubin, UA: NEGATIVE
Glucose, UA: NEGATIVE
Ketones, UA: NEGATIVE
Leukocytes,UA: NEGATIVE
Nitrite, UA: NEGATIVE
Protein,UA: NEGATIVE
Specific Gravity, UA: 1.015 (ref 1.005–1.030)
Urobilinogen, Ur: 0.2 mg/dL (ref 0.2–1.0)
pH, UA: 7 (ref 5.0–7.5)

## 2019-12-05 LAB — MICROSCOPIC EXAMINATION: Bacteria, UA: NONE SEEN

## 2019-12-05 NOTE — Progress Notes (Signed)
12/05/2019 10:29 AM   Grant Diaz July 09, 1951 [id number]  Referring provider: Venita Lick, NP [address] [place],  [place] [address]  Chief Complaint  Patient presents with  . Follow-up    1 yr    Urologic history: 1. 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  Gross hematuria             -Episode total gross painless hematuria 01/2018             -CTU 04/2018 no significant abnormalities             -Cystoscopy 05/2018 hypervascularity prostatic urethra   HPI: 69 yo male presents for annual follow-up. He states he has been doing well and has no complaints today. No bothersome lower tract symptoms. He has nocturia x1-2. Denies dysuria or recurrent gross hematuria. PSA performed by his PCP March 2020 was <0.1   PMH: Past Medical History:  Diagnosis Date  . Arthritis   . Cancer [facility]) 2008   Prostate-radiation  . Chronic kidney disease    kidney stones  remote history  . GERD (gastroesophageal reflux disease)   . History of DVT (deep vein thrombosis)   . Hypertension   . Joint pain   . Pancreatitis 2013   [facility]  . Prostate cancer ([place])   . Pulmonary embolus [facility]) March 2016   Bilateral pulmonary embolus, [facility]  . Sleep apnea, obstructive    cpap    Surgical History: Past Surgical History:  Procedure Laterality Date  . CARPAL TUNNEL RELEASE Right 03/15/2017   Procedure: CARPAL TUNNEL RELEASE;  Surgeon: Hessie Knows, MD;  Location: [facility] ORS;  Service: Orthopedics;  Laterality: Right;  . CHOLECYSTECTOMY    . COLONOSCOPY WITH PROPOFOL N/A 04/02/2018   Procedure: COLONOSCOPY WITH PROPOFOL;  Surgeon: Lucilla Lame, MD;  Location: [facility] ENDOSCOPY;  Service: Endoscopy;  Laterality: N/A;  . ESOPHAGOGASTRODUODENOSCOPY N/A 05/28/2013   Procedure: ESOPHAGOGASTRODUODENOSCOPY (EGD);  Surgeon: Shann Medal, MD;  Location: [facility];  Service: General;  Laterality: N/A;  .  GALLBLADDER SURGERY  2003  . GANGLION CYST EXCISION Right 03/15/2017   Procedure: REMOVAL GANGLION OF WRIST;  Surgeon: Hessie Knows, MD;  Location: [facility] ORS;  Service: Orthopedics;  Laterality: Right;  . HERNIA REPAIR    . hose  2011  . KNEE ARTHROSCOPY Bilateral [id number]  . LAPAROSCOPIC GASTRIC BANDING  2009   and hernia repair  . RADIATION IMPLANT W/ ULTRASOUND, MALE  2005   internal/external radiation for prostate cancer  . TOTAL KNEE ARTHROPLASTY Left 10/12/2015   Procedure: TOTAL KNEE ARTHROPLASTY;  Surgeon: Hessie Knows, MD;  Location: [facility] ORS;  Service: Orthopedics;  Laterality: Left;  . TOTAL KNEE ARTHROPLASTY Right 05/15/2017   Procedure: TOTAL KNEE ARTHROPLASTY;  Surgeon: Hessie Knows, MD;  Location: [facility] ORS;  Service: Orthopedics;  Laterality: Right;  . UMBILICAL HERNIA REPAIR  2010    Home Medications:  Allergies as of 12/05/2019      Reactions   Adhesive [tape] Rash   "whelps", latex adhesive tape only, paper tape is ok       Medication List       Accurate as of December 05, 2019 10:29 AM. If you have any questions, ask your nurse or doctor.        acetaminophen 325 MG tablet Commonly  known as: TYLENOL Take 650 mg by mouth every 6 (six) hours as needed.   amLODipine 10 MG tablet Commonly known as: NORVASC Take 1 tablet by mouth once daily   aspirin EC 81 MG tablet Take 81 mg by mouth daily.   celecoxib 200 MG capsule Commonly known as: CELEBREX Take 200 mg by mouth daily.   D3-1000 25 MCG (1000 UT) capsule Generic drug: Cholecalciferol Take 1,000 Units by mouth daily.   ketoconazole 2 % cream Commonly known as: NIZORAL Apply 1 application topically daily as needed.   lisinopril-hydrochlorothiazide 20-25 MG tablet Commonly known as: ZESTORETIC Take 1 tablet by mouth once daily   MAGNESIUM OXIDE PO Take by mouth daily.   multivitamin capsule Take 1 capsule by mouth daily.   omeprazole 20 MG capsule Commonly known as: PRILOSEC Take 20 mg  by mouth every other day.   triamcinolone 0.025 % cream Commonly known as: KENALOG Apply 1 application topically 2 (two) times daily as needed (skin rash).   Turmeric 500 MG Caps Take 1 capsule by mouth daily.       Allergies:  Allergies  Allergen Reactions  . Adhesive [Tape] Rash    "whelps", latex adhesive tape only, paper tape is ok     Family History: Family History  Problem Relation Age of Onset  . Diabetes Mother   . Parkinson's disease Father   . Hypertension Brother   . Diabetes Son        type 1  . Heart disease Maternal Grandmother        CHF  . Stroke Maternal Grandfather   . Heart disease Paternal Grandmother        CHF  . Stroke Paternal Grandfather     Social History:  reports that he has never smoked. He has never used smokeless tobacco. He reports that he does not drink alcohol or use drugs.  ROS: As per the HPI otherwise noncontributory  Physical Exam: BP (!) 157/79 (BP Location: Left Arm, Patient Position: Sitting, Cuff Size: Large)   Pulse 88   Ht 5\' 9"  (1.753 m)   Wt 253 lb (114.8 kg)   BMI 37.36 kg/m   Constitutional:  Alert and oriented, No acute distress. HEENT: Trail AT, moist mucus membranes.  Trachea midline, no masses. Cardiovascular: No clubbing, cyanosis, or edema. Respiratory: Normal respiratory effort, no increased work of breathing. Neurologic: Grossly intact, no focal deficits, moving all 4 extremities. Psychiatric: Normal mood and affect.  Laboratory Data:  Urinalysis Dipstick/microscopy negative  Assessment & Plan:    - Personal history prostate cancer Doing well. PSA last year undetectable and he is scheduled for blood work next month with his PCP. Continue annual follow-up   Abbie Sons, MD  [facility] [address], [place], [place] [address] [phone]

## 2019-12-29 ENCOUNTER — Other Ambulatory Visit: Payer: Self-pay | Admitting: Nurse Practitioner

## 2019-12-29 NOTE — Telephone Encounter (Signed)
Requested Prescriptions  Pending Prescriptions Disp Refills  . amLODipine (NORVASC) 10 MG tablet [Pharmacy Med Name: amLODIPine Besylate 10 MG Oral Tablet] 90 tablet 0    Sig: Take 1 tablet by mouth once daily     Cardiovascular:  Calcium Channel Blockers Failed - 12/29/2019  8:34 AM      Failed - Last BP in normal range    BP Readings from Last 1 Encounters:  12/05/19 (!) 157/79         Passed - Valid encounter within last 6 months    Recent Outpatient Visits          5 months ago Essential hypertension, benign   [place], [place] T, NP   10 months ago Leg cramp   [place], [place], DO   10 months ago Left leg pain   [place], Lilia [place], [place]   1 year ago Annual physical exam   [place], Barbaraann Faster, NP   1 year ago Need for influenza vaccination   [place], Barbaraann Faster, NP      Future Appointments            In 2 months  [place], [place]   In 11 months Stoioff, Ronda Fairly, MD [facility]

## 2020-02-13 ENCOUNTER — Encounter: Payer: Self-pay | Admitting: Nurse Practitioner

## 2020-02-25 ENCOUNTER — Other Ambulatory Visit: Payer: Self-pay | Admitting: Nurse Practitioner

## 2020-03-24 ENCOUNTER — Ambulatory Visit (INDEPENDENT_AMBULATORY_CARE_PROVIDER_SITE_OTHER): Payer: Medicare PPO

## 2020-03-24 ENCOUNTER — Ambulatory Visit: Payer: Medicare PPO

## 2020-03-24 VITALS — BP 148/73 | HR 76 | Ht 69.0 in | Wt 248.0 lb

## 2020-03-24 DIAGNOSIS — Z0000 Encounter for general adult medical examination without abnormal findings: Secondary | ICD-10-CM

## 2020-03-24 NOTE — Patient Instructions (Signed)
Grant Diaz , Thank you for taking time to come for your Medicare Wellness Visit. I appreciate your ongoing commitment to your health goals. Please review the following plan we discussed and let me know if I can assist you in the future.   Screening recommendations/referrals: Colonoscopy: colonoscopy completed 04/02/2018, due 2024 Recommended yearly ophthalmology/optometry visit for glaucoma screening and checkup Recommended yearly dental visit for hygiene and checkup  Vaccinations: Influenza vaccine: due 06/2020 Pneumococcal vaccine: completed first dose 12/24/2018, second dose due, can get at next in office visit.  Tdap vaccine: completed 12/24/2018 Shingles vaccine: shingrix completed, check with pharmacy on dates completed and bring them to your next appt.    Covid-19: completed series  Advanced directives: copy on file    Conditions/risks identified: Hypertension, please follow up with your PCP as scheduled.   Next appointment: Follow up in one year for your annual wellness visit. 03/31/2021 at 815am  Preventive Care 65 Years and Older, Male Preventive care refers to lifestyle choices and visits with your health care provider that can promote health and wellness. What does preventive care include?  A yearly physical exam. This is also called an annual well check.  Dental exams once or twice a year.  Routine eye exams. Ask your health care provider how often you should have your eyes checked.  Personal lifestyle choices, including:  Daily care of your teeth and gums.  Regular physical activity.  Eating a healthy diet.  Avoiding tobacco and drug use.  Limiting alcohol use.  Practicing safe sex.  Taking low doses of aspirin every day.  Taking vitamin and mineral supplements as recommended by your health care provider. What happens during an annual well check? The services and screenings done by your health care provider during your annual well check will depend on your age,  overall health, lifestyle risk factors, and family history of disease. Counseling  Your health care provider may ask you questions about your:  Alcohol use.  Tobacco use.  Drug use.  Emotional well-being.  Home and relationship well-being.  Sexual activity.  Eating habits.  History of falls.  Memory and ability to understand (cognition).  Work and work Statistician. Screening  You may have the following tests or measurements:  Height, weight, and BMI.  Blood pressure.  Lipid and cholesterol levels. These may be checked every 5 years, or more frequently if you are over 24 years old.  Skin check.  Lung cancer screening. You may have this screening every year starting at age 74 if you have a 30-pack-year history of smoking and currently smoke or have quit within the past 15 years.  Fecal occult blood test (FOBT) of the stool. You may have this test every year starting at age 25.  Flexible sigmoidoscopy or colonoscopy. You may have a sigmoidoscopy every 5 years or a colonoscopy every 10 years starting at age 71.  Prostate cancer screening. Recommendations will vary depending on your family history and other risks.  Hepatitis C blood test.  Hepatitis B blood test.  Sexually transmitted disease (STD) testing.  Diabetes screening. This is done by checking your blood sugar (glucose) after you have not eaten for a while (fasting). You may have this done every 1-3 years.  Abdominal aortic aneurysm (AAA) screening. You may need this if you are a current or former smoker.  Osteoporosis. You may be screened starting at age 79 if you are at high risk. Talk with your health care provider about your test results, treatment options, and  if necessary, the need for more tests. Vaccines  Your health care provider may recommend certain vaccines, such as:  Influenza vaccine. This is recommended every year.  Tetanus, diphtheria, and acellular pertussis (Tdap, Td) vaccine. You may  need a Td booster every 10 years.  Zoster vaccine. You may need this after age 82.  Pneumococcal 13-valent conjugate (PCV13) vaccine. One dose is recommended after age 24.  Pneumococcal polysaccharide (PPSV23) vaccine. One dose is recommended after age 55. Talk to your health care provider about which screenings and vaccines you need and how often you need them. This information is not intended to replace advice given to you by your health care provider. Make sure you discuss any questions you have with your health care provider. Document Released: 11/05/2015 Document Revised: 06/28/2016 Document Reviewed: 08/10/2015 Elsevier Interactive Patient Education  2017 [employer] Prevention in the Home Falls can cause injuries. They can happen to people of all ages. There are many things you can do to make your home safe and to help prevent falls. What can I do on the outside of my home?  Regularly fix the edges of walkways and driveways and fix any cracks.  Remove anything that might make you trip as you walk through a door, such as a raised step or threshold.  Trim any bushes or trees on the path to your home.  Use bright outdoor lighting.  Clear any walking paths of anything that might make someone trip, such as rocks or tools.  Regularly check to see if handrails are loose or broken. Make sure that both sides of any steps have handrails.  Any raised decks and porches should have guardrails on the edges.  Have any leaves, snow, or ice cleared regularly.  Use sand or salt on walking paths during winter.  Clean up any spills in your garage right away. This includes oil or grease spills. What can I do in the bathroom?  Use night lights.  Install grab bars by the toilet and in the tub and shower. Do not use towel bars as grab bars.  Use non-skid mats or decals in the tub or shower.  If you need to sit down in the shower, use a plastic, non-slip stool.  Keep the floor  dry. Clean up any water that spills on the floor as soon as it happens.  Remove soap buildup in the tub or shower regularly.  Attach bath mats securely with double-sided non-slip rug tape.  Do not have throw rugs and other things on the floor that can make you trip. What can I do in the bedroom?  Use night lights.  Make sure that you have a light by your bed that is easy to reach.  Do not use any sheets or blankets that are too big for your bed. They should not hang down onto the floor.  Have a firm chair that has side arms. You can use this for support while you get dressed.  Do not have throw rugs and other things on the floor that can make you trip. What can I do in the kitchen?  Clean up any spills right away.  Avoid walking on wet floors.  Keep items that you use a lot in easy-to-reach places.  If you need to reach something above you, use a strong step stool that has a grab bar.  Keep electrical cords out of the way.  Do not use floor polish or wax that makes floors slippery. If you  must use wax, use non-skid floor wax.  Do not have throw rugs and other things on the floor that can make you trip. What can I do with my stairs?  Do not leave any items on the stairs.  Make sure that there are handrails on both sides of the stairs and use them. Fix handrails that are broken or loose. Make sure that handrails are as long as the stairways.  Check any carpeting to make sure that it is firmly attached to the stairs. Fix any carpet that is loose or worn.  Avoid having throw rugs at the top or bottom of the stairs. If you do have throw rugs, attach them to the floor with carpet tape.  Make sure that you have a light switch at the top of the stairs and the bottom of the stairs. If you do not have them, ask someone to add them for you. What else can I do to help prevent falls?  Wear shoes that:  Do not have high heels.  Have rubber bottoms.  Are comfortable and fit you  well.  Are closed at the toe. Do not wear sandals.  If you use a stepladder:  Make sure that it is fully opened. Do not climb a closed stepladder.  Make sure that both sides of the stepladder are locked into place.  Ask someone to hold it for you, if possible.  Clearly mark and make sure that you can see:  Any grab bars or handrails.  First and last steps.  Where the edge of each step is.  Use tools that help you move around (mobility aids) if they are needed. These include:  Canes.  Walkers.  Scooters.  Crutches.  Turn on the lights when you go into a dark area. Replace any light bulbs as soon as they burn out.  Set up your furniture so you have a clear path. Avoid moving your furniture around.  If any of your floors are uneven, fix them.  If there are any pets around you, be aware of where they are.  Review your medicines with your doctor. Some medicines can make you feel dizzy. This can increase your chance of falling. Ask your doctor what other things that you can do to help prevent falls. This information is not intended to replace advice given to you by your health care provider. Make sure you discuss any questions you have with your health care provider. Document Released: 08/05/2009 Document Revised: 03/16/2016 Document Reviewed: 11/13/2014 Elsevier Interactive Patient Education  2017 [employer].

## 2020-03-24 NOTE — Progress Notes (Signed)
Subjective:   Grant Diaz is a 69 y.o. male who presents for an Initial Medicare Annual Wellness Visit.  I connected with Ayaz today by telephone and verified that I am speaking with the correct person using two identifiers. Location patient: home Location provider: work Persons participating in the virtual visit: patient, Marine scientist.    I discussed the limitations, risks, security and privacy concerns of performing an evaluation and management service by telephone and the availability of in person appointments. I also discussed with the patient that there may be a patient responsible charge related to this service. The patient expressed understanding and verbally consented to this telephonic visit.    Interactive audio and video telecommunications were attempted between this provider and patient, however failed, due to patient having technical difficulties OR patient did not have access to video capability.  We continued and completed visit with audio only.  Some vital signs may be absent or patient reported.   Time Spent with patient on telephone encounter: 20 minutes   Review of Systems  Cardiac Risk Factors include: advanced age (>42men, >69 women);dyslipidemia;hypertension;male gender    Objective:    Today's Vitals   03/24/20 0823  BP: (!) 148/73  Pulse: 76  Weight: 248 lb (112.5 kg)  Height: 5\' 9"  (1.753 m)   Body mass index is 36.62 kg/m.  Advanced Directives 03/24/2020 04/02/2018 05/15/2017 05/09/2017 03/09/2017 10/12/2015 10/12/2015  Does Patient Have a Medical Advance Directive? Yes Yes Yes Yes Yes Yes Yes  Type of Advance Directive Living will;Healthcare Power of [place];Living will [place];Living will [place];Living will [place];Living will [place];Living will  Does patient want to make changes to medical advance directive? - - No - Patient declined - No -  Patient declined - -  Copy of [place] in Chart? Yes - validated most recent copy scanned in chart (See row information) - No - copy requested - - Yes -    Current Medications (verified) Outpatient Encounter Medications as of 03/24/2020  Medication Sig  . acetaminophen (TYLENOL) 325 MG tablet Take 650 mg by mouth every 6 (six) hours as needed.  Marland Kitchen amLODipine (NORVASC) 10 MG tablet Take 1 tablet by mouth once daily  . aspirin EC 81 MG tablet Take 81 mg by mouth daily.  . celecoxib (CELEBREX) 200 MG capsule Take 200 mg by mouth daily.  . Cholecalciferol (D3-1000) 1000 UNITS capsule Take 1,000 Units by mouth daily.  Marland Kitchen ketoconazole (NIZORAL) 2 % cream Apply 1 application topically daily as needed.  Marland Kitchen lisinopril-hydrochlorothiazide (ZESTORETIC) 20-25 MG tablet Take 1 tablet by mouth once daily  . MAGNESIUM OXIDE PO Take by mouth daily.  . Multiple Vitamin (MULTIVITAMIN) capsule Take 1 capsule by mouth daily.    Marland Kitchen omeprazole (PRILOSEC) 20 MG capsule Take 20 mg by mouth every other day.  . triamcinolone (KENALOG) 0.025 % cream Apply 1 application topically 2 (two) times daily as needed (skin rash).  . Turmeric 500 MG CAPS Take 1 capsule by mouth daily.   No facility-administered encounter medications on file as of 03/24/2020.    Allergies (verified) Adhesive [tape]   History: Past Medical History:  Diagnosis Date  . Arthritis   . Cancer [facility]) 2008   Prostate-radiation  . Cataract 2019   not yet significant enough to warrant surgery  . Chronic kidney disease    kidney stones  remote history  . GERD (gastroesophageal reflux  disease)   . History of DVT (deep vein thrombosis)   . Hypertension   . Joint pain   . Pancreatitis 2013   [facility]  . Prostate cancer ([place])   . Pulmonary embolus [facility]) March 2016   Bilateral pulmonary embolus, [facility]  . Sleep apnea 2010   use CPAP regularly  . Sleep apnea, obstructive    cpap   Past Surgical History:  Procedure Laterality Date    . CARPAL TUNNEL RELEASE Right 03/15/2017   Procedure: CARPAL TUNNEL RELEASE;  Surgeon: Hessie Knows, MD;  Location: [facility] ORS;  Service: Orthopedics;  Laterality: Right;  . CHOLECYSTECTOMY    . COLONOSCOPY WITH PROPOFOL N/A 04/02/2018   Procedure: COLONOSCOPY WITH PROPOFOL;  Surgeon: Lucilla Lame, MD;  Location: [facility] ENDOSCOPY;  Service: Endoscopy;  Laterality: N/A;  . ESOPHAGOGASTRODUODENOSCOPY N/A 05/28/2013   Procedure: ESOPHAGOGASTRODUODENOSCOPY (EGD);  Surgeon: Shann Medal, MD;  Location: [facility] ENDOS[facility];  Service: General;  Laterality: N/A;  . GALLBLADDER SURGERY  2003  . GANGLION CYST EXCISION Right 03/15/2017   Procedure: REMOVAL GANGLION OF WRIST;  Surgeon: Hessie Knows, MD;  Location: [facility] ORS;  Service: Orthopedics;  Laterality: Right;  . HERNIA REPAIR    . hose  2011  . JOINT REPLACEMENT    . KNEE ARTHROSCOPY Bilateral [id number]  . LAPAROSCOPIC GASTRIC BANDING  2009   and hernia repair  . RADIATION IMPLANT W/ ULTRASOUND, MALE  2005   internal/external radiation for prostate cancer  . TOTAL KNEE ARTHROPLASTY Left 10/12/2015   Procedure: TOTAL KNEE ARTHROPLASTY;  Surgeon: Hessie Knows, MD;  Location: [facility] ORS;  Service: Orthopedics;  Laterality: Left;  . TOTAL KNEE ARTHROPLASTY Right 05/15/2017   Procedure: TOTAL KNEE ARTHROPLASTY;  Surgeon: Hessie Knows, MD;  Location: [facility] ORS;  Service: Orthopedics;  Laterality: Right;  . UMBILICAL HERNIA REPAIR  2010   Family History  Problem Relation Age of Onset  . Diabetes Mother   . Arthritis Mother   . Obesity Mother   . Parkinson's disease Father   . Hypertension Brother   . Arthritis Brother   . Asthma Brother   . Diabetes Son        type 1  . Heart disease Maternal Grandmother        CHF  . Stroke Maternal Grandfather   . Heart disease Paternal Grandmother        CHF  . Stroke Paternal Grandfather    Social History   Socioeconomic History  . Marital status: Married    Spouse name: Not on file  . Number of children:  Not on file  . Years of education: Not on file  . Highest education level: Not on file  Occupational History  . Occupation: pastor   Tobacco Use  . Smoking status: Never Smoker  . Smokeless tobacco: Never Used  Substance and Sexual Activity  . Alcohol use: Not Currently    Comment: quit using alcohol upon entering [place]  . Drug use: Never  . Sexual activity: Not Currently    Birth control/protection: None  Other Topics Concern  . Not on file  Social History Narrative  . Not on file   Social Determinants of Health   Financial Resource Strain: Low Risk   . Difficulty of Paying Living Expenses: Not hard at all  Food Insecurity: No Food Insecurity  . Worried About Charity fundraiser in the Last Year: Never true  . Ran Out of Food in the Last Year: Never true  Transportation Needs: No Transportation Needs  .  Lack of Transportation (Medical): No  . Lack of Transportation (Non-Medical): No  Physical Activity: Insufficiently Active  . Days of Exercise per Week: 4 days  . Minutes of Exercise per Session: 30 min  Stress:   . Feeling of Stress :   Social Connections: Not Isolated  . Frequency of Communication with Friends and Family: More than three times a week  . Frequency of Social Gatherings with Friends and Family: More than three times a week  . Attends Religious Services: More than 4 times per year  . Active Member of Clubs or Organizations: Yes  . Attends Archivist Meetings: More than 4 times per year  . Marital Status: Married   Tobacco Counseling Counseling given: Not Answered   Clinical Intake:  Pre-visit preparation completed: Yes  Pain : No/denies pain     Nutritional Status: BMI > 30  Obese Nutritional Risks: None Diabetes: No  How often do you need to have someone help you when you read instructions, pamphlets, or other written materials from your doctor or pharmacy?: 1 - Never  Interpreter Needed?: No  Information entered by ::  Lamaria Hildebrandt,LPN  Activities of Daily Living In your present state of health, do you have any difficulty performing the following activities: 03/24/2020  Hearing? N  Comment no hearing aids  Vision? N  Comment glasses as needed, goes [email or web address], Dr.Daily  Difficulty concentrating or making decisions? N  Walking or climbing stairs? N  Dressing or bathing? N  Doing errands, shopping? N  Preparing Food and eating ? N  Using the Toilet? N  In the past six months, have you accidently leaked urine? N  Do you have problems with loss of bowel control? N  Managing your Medications? N  Managing your Finances? N  Housekeeping or managing your Housekeeping? N  Some recent data might be hidden     Immunizations and Health Maintenance Immunization History  Administered Date(s) Administered  . Influenza, High Dose Seasonal PF 11/28/2018, 07/08/2019  . Influenza-Unspecified 07/23/2017  . Moderna SARS-COVID-2 Vaccination 12/17/2019, 01/14/2020  . Pneumococcal Conjugate-13 12/24/2018  . Td 12/24/2018  . Zoster Recombinat (Shingrix) 07/08/2019   Health Maintenance Due  Topic Date Due  . PNA vac Low Risk Adult (2 of 2 - PPSV23) 12/24/2019    Patient Care Team: Venita Lick, NP as PCP - General (Nurse Practitioner) Abbie Sons, MD (Urology) Samara Deist, DPM as Referring Physician (Podiatry)  Indicate any recent Medical Services you may have received from other than [facility] providers in the past year (date may be approximate).    Assessment:   This is a routine wellness examination for Danile.  Hearing/Vision screen  Hearing Screening   125Hz  250Hz  500Hz  1000Hz  2000Hz  3000Hz  4000Hz  6000Hz  8000Hz   Right ear:           Left ear:           Vision Screening Comments: Goes to Dr.Daily annually   Dietary issues and exercise activities discussed: Current Exercise Habits: Home exercise routine, Type of exercise: strength training/weights;walking([employer], swimming), Time  (Minutes): 30, Frequency (Times/Week): 4, Weekly Exercise (Minutes/Week): 120, Intensity: Moderate, Exercise limited by: None identified  Goals Addressed   None    Depression Screen PHQ 2/9 Scores 03/24/2020 12/24/2018 09/25/2017  PHQ - 2 Score 0 0 0  PHQ- 9 Score - - 1    Fall Risk Fall Risk  03/24/2020 12/24/2018 09/25/2017  Falls in the past year? 0 0 No  Number  falls in past yr: 0 0 -  Injury with Fall? 0 0 -    FALL RISK PREVENTION PERTAINING TO THE HOME:  Any stairs in or around the home? No  If so, are there any without handrails? No   Home free of loose throw rugs in walkways, pet beds, electrical cords, etc? Yes  Adequate lighting in your home to reduce risk of falls? Yes   ASSISTIVE DEVICES UTILIZED TO PREVENT FALLS:  Life alert? No  Use of a cane, walker or w/c? No  Grab bars in the bathroom? Yes  Shower chair or bench in shower? No  Elevated toilet seat or a handicapped toilet? No    TIMED UP AND GO:  Unable to perform    Cognitive Function: not indicated         Screening Tests Health Maintenance  Topic Date Due  . PNA vac Low Risk Adult (2 of 2 - PPSV23) 12/24/2019  . INFLUENZA VACCINE  05/23/2020  . COLONOSCOPY  04/03/2023  . TETANUS/TDAP  12/23/2028  . COVID-19 Vaccine  Completed  . Hepatitis C Screening  Completed    Qualifies for Shingles Vaccine? Yes Shingrix completed 07/08/2019, will check when he had 2nd dose   Tdap: up to date   Flu Vaccine: up to date   Pneumococcal Vaccine: completed first dose, second dose due, will get at next In office visit.   Covid-19 Vaccine: Completed vaccines  Cancer Screenings:  Colorectal Screening: Completed 03/13/2018, complete again in 3 years.   Lung Cancer Screening: (Low Dose CT Chest recommended if Age 110-80 years, 30 pack-year currently smoking OR have quit w/in 15years.) does not qualify.     Additional Screening:  Hepatitis C Screening: does qualify; Completed 12/24/2018  Vision Screening:  Recommended annual ophthalmology exams for early detection of glaucoma and other disorders of the eye. Is the patient up to date with their annual eye exam?  Yes  Who is the provider or what is the name of the office in which the pt attends annual eye exams? Dr.Daily   Dental Screening: Recommended annual dental exams for proper oral hygiene  Community Resource Referral:  CRR required this visit?  No        Plan:  I have personally reviewed and addressed the Medicare Annual Wellness questionnaire and have noted the following in the patient's chart:  A. Medical and social history B. Use of alcohol, tobacco or illicit drugs  C. Current medications and supplements D. Functional ability and status E.  Nutritional status F.  Physical activity G. Advance directives H. List of other physicians I.  Hospitalizations, surgeries, and ER visits in previous 12 months J.  [place] such as hearing and vision if needed, cognitive and depression L. Referrals and appointments   In addition, I have reviewed and discussed with patient certain preventive protocols, quality metrics, and best practice recommendations. A written personalized care plan for preventive services as well as general preventive health recommendations were provided to patient.   Signed,    Bevelyn Ngo, LPN   [id number]  Nurse Health Advisor   Nurse Notes:  Patient is requesting a refill on lisinopril, requesting these to be a 3 month supply, also needs refill on amlodipine. Scheduled for office visit in September.

## 2020-03-25 ENCOUNTER — Ambulatory Visit: Payer: Medicare PPO

## 2020-03-29 ENCOUNTER — Ambulatory Visit: Payer: Medicare PPO

## 2020-03-29 ENCOUNTER — Other Ambulatory Visit: Payer: Self-pay | Admitting: Nurse Practitioner

## 2020-03-29 NOTE — Telephone Encounter (Signed)
Requested medication (s) are due for refill today  Yes  Requested medication (s) are on the active medication list   Yes  Future visit scheduled Yes  on 06/29/20.   Note to clinic-See TE with this request. Last 2 visits cancelled by provider.    Requested Prescriptions  Pending Prescriptions Disp Refills   amLODipine (NORVASC) 10 MG tablet [Pharmacy Med Name: amLODIPine Besylate 10 MG Oral Tablet] 90 tablet 0    Sig: Take 1 tablet by mouth once daily      Cardiovascular:  Calcium Channel Blockers Failed - 03/29/2020  9:23 AM      Failed - Last BP in normal range    BP Readings from Last 1 Encounters:  03/24/20 (!) 148/73          Passed - Valid encounter within last 6 months    Recent Outpatient Visits           8 months ago Essential hypertension, benign   [place], [place] T, NP   1 year ago Leg cramp   [place], [place], DO   1 year ago Left leg pain   [place], Lilia Argue, [place]   1 year ago Annual physical exam   [place], [place] T, NP   1 year ago Need for influenza vaccination   [place], Barbaraann Faster, NP       Future Appointments             In 3 months Cannady, Barbaraann Faster, NP [facility]   In 8 months Stoioff, Ronda Fairly, MD [facility]   In 1 year  [facility]              lisinopril-hydrochlorothiazide (ZESTORETIC) 20-25 MG tablet [employer] Med Name: Lisinopril-hydroCHLOROthiazide 20-25 MG Oral Tablet] 30 tablet 0    Sig: Take 1 tablet by mouth once daily      Cardiovascular:  ACEI + Diuretic Combos Failed - 03/29/2020  9:23 AM      Failed - Na in normal range and within 180 days    Sodium  Date Value Ref Range Status  07/18/2019 141 134 - 144 mmol/L Final          Failed - K in normal range and within 180 days    Potassium  Date Value Ref Range Status  07/18/2019 3.6 3.5 - 5.2  mmol/L Final          Failed - Cr in normal range and within 180 days    Creatinine, Ser  Date Value Ref Range Status  07/18/2019 1.05 0.76 - 1.27 mg/dL Final          Failed - Ca in normal range and within 180 days    Calcium  Date Value Ref Range Status  07/18/2019 9.2 8.6 - 10.2 mg/dL Final          Failed - Last BP in normal range    BP Readings from Last 1 Encounters:  03/24/20 (!) 148/73          Passed - Patient is not pregnant      Passed - Valid encounter within last 6 months    Recent Outpatient Visits           8 months ago Essential hypertension, benign   [place], Henrine Screws T, NP   1 year ago Leg cramp   [place],  Megan P, DO   1 year ago Left leg pain   [facility] Mer[place] Roof [place], [place]   1 year ago Annual physical exam   [place], Barbaraann Faster, NP   1 year ago Need for influenza vaccination   [place], Barbaraann Faster, NP       Future Appointments             In 3 months Cannady, Barbaraann Faster, NP [facility]   In 8 months Stoioff, Ronda Fairly, MD [facility]   In 1 year  [facility]

## 2020-03-29 NOTE — Telephone Encounter (Signed)
Routing to provider  

## 2020-03-29 NOTE — Telephone Encounter (Signed)
Pt called in and stated he would like a 90 supply for both of these meds.  They are leaving for [place] and needs to be able to pick them up tomorrow.  He stated that this was suppose to be called in during his [place]

## 2020-05-29 ENCOUNTER — Encounter: Payer: Self-pay | Admitting: Urology

## 2020-06-02 ENCOUNTER — Ambulatory Visit: Payer: Medicare PPO | Admitting: Urology

## 2020-06-02 ENCOUNTER — Encounter: Payer: Self-pay | Admitting: Urology

## 2020-06-02 ENCOUNTER — Other Ambulatory Visit: Payer: Self-pay

## 2020-06-02 VITALS — BP 146/77 | HR 46 | Ht 68.0 in | Wt 248.0 lb

## 2020-06-02 DIAGNOSIS — R310 Gross hematuria: Secondary | ICD-10-CM | POA: Diagnosis not present

## 2020-06-02 DIAGNOSIS — Z8546 Personal history of malignant neoplasm of prostate: Secondary | ICD-10-CM

## 2020-06-02 NOTE — Progress Notes (Signed)
06/02/2020 11:51 AM   Grant Diaz 1951-05-10 [id number]  Referring provider: Venita Lick, NP [address] [place],  [place] [address]  Chief Complaint  Patient presents with   Hematuria    Urologic history: 1. 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 male presents for recurrent gross hematuria.   Prior evaluation 2019  Traveling 7/11 and the following morning had a urge to void with inability to empty bladder  Subsequently successful in voiding and describes urine as heavy blood with cranberry juice appearance  Other than urge to void had no pain, no dysuria  Hematuria resolved however recurred 8/7  Some lower abdominal discomfort and constipation with that episode  Has had intermittent hematuria since 8/7   PMH: Past Medical History:  Diagnosis Date   Arthritis    Cancer ([place]) 2008   Prostate-radiation   Cataract 2019   not yet significant enough to warrant surgery   Chronic kidney disease    kidney stones  remote history   GERD (gastroesophageal reflux disease)    History of DVT (deep vein thrombosis)    Hypertension    Joint pain    Pancreatitis 2013   [facility]   Prostate cancer [facility]    Pulmonary embolus ([place]) March 2016   Bilateral pulmonary embolus, [facility]   Sleep apnea 2010   use CPAP regularly   Sleep apnea, obstructive    cpap    Surgical History: Past Surgical History:  Procedure Laterality Date   CARPAL TUNNEL RELEASE Right 03/15/2017   Procedure: CARPAL TUNNEL RELEASE;  Surgeon: Hessie Knows, MD;  Location: [facility] ORS;  Service: Orthopedics;  Laterality: Right;   CHOLECYSTECTOMY     COLONOSCOPY WITH PROPOFOL N/A  04/02/2018   Procedure: COLONOSCOPY WITH PROPOFOL;  Surgeon: Lucilla Lame, MD;  [facility];  Service: Endoscopy;  Laterality: N/A;   ESOPHAGOGASTRODUODENOSCOPY N/A 05/28/2013   Procedure: ESOPHAGOGASTRODUODENOSCOPY (EGD);  Surgeon: Shann Medal, MD;  Location: [facility];  Service: General;  Laterality: N/A;   GALLBLADDER SURGERY  2003   GANGLION CYST EXCISION Right 03/15/2017   Procedure: REMOVAL GANGLION OF WRIST;  Surgeon: Hessie Knows, MD;  Location: [facility] ORS;  Service: Orthopedics;  Laterality: Right;   HERNIA REPAIR     hose  2011   JOINT REPLACEMENT     KNEE ARTHROSCOPY Bilateral [id number],[id number]   LAPAROSCOPIC GASTRIC BANDING  2009   and hernia repair   RADIATION IMPLANT W/ ULTRASOUND, MALE  2005   internal/external radiation for prostate cancer   TOTAL KNEE ARTHROPLASTY Left 10/12/2015   Procedure: TOTAL KNEE ARTHROPLASTY;  Surgeon: Hessie Knows, MD;  Location: [facility] ORS;  Service: Orthopedics;  Laterality: Left;   TOTAL KNEE ARTHROPLASTY Right 05/15/2017   Procedure: TOTAL KNEE ARTHROPLASTY;  Surgeon: Hessie Knows, MD;  Location: [facility] ORS;  Service: Orthopedics;  Laterality: Right;   UMBILICAL HERNIA REPAIR  2010    Home Medications:  Allergies as of 06/02/2020      Reactions   Adhesive [tape] Rash   "whelps", latex adhesive tape only, paper tape is ok       Medication List       Accurate as of June 02, 2020 11:51 AM. If you have any questions, ask your nurse or doctor.        acetaminophen 325 MG tablet Commonly known as: TYLENOL Take 650  mg by mouth every 6 (six) hours as needed.   amLODipine 10 MG tablet Commonly known as: NORVASC Take 1 tablet by mouth once daily   aspirin EC 81 MG tablet Take 81 mg by mouth daily.   celecoxib 200 MG capsule Commonly known as: CELEBREX Take 200 mg by mouth daily.   D3-1000 25 MCG (1000 UT) capsule Generic drug: Cholecalciferol Take 1,000 Units by mouth daily.   ketoconazole 2 % cream Commonly  known as: NIZORAL Apply 1 application topically daily as needed.   lisinopril-hydrochlorothiazide 20-25 MG tablet Commonly known as: ZESTORETIC Take 1 tablet by mouth once daily   MAGNESIUM OXIDE PO Take by mouth daily.   multivitamin capsule Take 1 capsule by mouth daily.   omeprazole 20 MG capsule Commonly known as: PRILOSEC Take 20 mg by mouth every other day.   triamcinolone 0.025 % cream Commonly known as: KENALOG Apply 1 application topically 2 (two) times daily as needed (skin rash).   Turmeric 500 MG Caps Take 1 capsule by mouth daily.       Allergies:  Allergies  Allergen Reactions   Adhesive [Tape] Rash    "whelps", latex adhesive tape only, paper tape is ok     Family History: Family History  Problem Relation Age of Onset   Diabetes Mother    Arthritis Mother    Obesity Mother    Parkinson's disease Father    Hypertension Brother    Arthritis Brother    Asthma Brother    Diabetes Son        type 1   Heart disease Maternal Grandmother        CHF   Stroke Maternal Grandfather    Heart disease Paternal Grandmother        CHF   Stroke Paternal Grandfather     Social History:  reports that he has never smoked. He has never used smokeless tobacco. He reports previous alcohol use. He reports that he does not use drugs.   Physical Exam: BP (!) 146/77    Pulse (!) 46    Ht 5\' 8"  (1.727 m)    Wt 248 lb (112.5 kg)    BMI 37.71 kg/m   Constitutional:  Alert and oriented, No acute distress. HEENT: [place] AT, moist mucus membranes.  Trachea midline, no masses. Cardiovascular: No clubbing, cyanosis, or edema. Respiratory: Normal respiratory effort, no increased work of breathing. Skin: No rashes, bruises or suspicious lesions. Neurologic: Grossly intact, no focal deficits, moving all 4 extremities. Psychiatric: Normal mood and affect.    Assessment & Plan:    1. Gross hematuria  Recurrent gross hematuria  AUA hematuria risk  stratification: High  Last evaluation 2019; risk factor pelvic radiation  Recommended reevaluation with CT urogram and cystoscopy  Procedures were discussed    Abbie Sons, MD  [place] [address] [place], [place] [address] [phone]

## 2020-06-03 LAB — URINALYSIS, COMPLETE
Bilirubin, UA: NEGATIVE
Glucose, UA: NEGATIVE
Leukocytes,UA: NEGATIVE
Nitrite, UA: NEGATIVE
Protein,UA: NEGATIVE
RBC, UA: NEGATIVE
Specific Gravity, UA: 1.02 (ref 1.005–1.030)
Urobilinogen, Ur: 1 mg/dL (ref 0.2–1.0)
pH, UA: 7 (ref 5.0–7.5)

## 2020-06-03 LAB — MICROSCOPIC EXAMINATION
Bacteria, UA: NONE SEEN
Epithelial Cells (non renal): NONE SEEN /hpf (ref 0–10)

## 2020-06-06 ENCOUNTER — Encounter: Payer: Self-pay | Admitting: Urology

## 2020-06-08 ENCOUNTER — Encounter: Payer: Self-pay | Admitting: Urology

## 2020-06-23 ENCOUNTER — Other Ambulatory Visit: Payer: Self-pay | Admitting: Nurse Practitioner

## 2020-06-23 DIAGNOSIS — K529 Noninfective gastroenteritis and colitis, unspecified: Secondary | ICD-10-CM | POA: Insufficient documentation

## 2020-06-29 ENCOUNTER — Ambulatory Visit: Payer: Medicare PPO | Admitting: Nurse Practitioner

## 2020-06-29 ENCOUNTER — Encounter: Payer: Self-pay | Admitting: Nurse Practitioner

## 2020-06-29 ENCOUNTER — Other Ambulatory Visit: Payer: Self-pay

## 2020-06-29 VITALS — BP 134/79 | HR 72 | Temp 97.9°F | Wt 257.0 lb

## 2020-06-29 DIAGNOSIS — Z8546 Personal history of malignant neoplasm of prostate: Secondary | ICD-10-CM

## 2020-06-29 DIAGNOSIS — E669 Obesity, unspecified: Secondary | ICD-10-CM

## 2020-06-29 DIAGNOSIS — E7800 Pure hypercholesterolemia, unspecified: Secondary | ICD-10-CM | POA: Diagnosis not present

## 2020-06-29 DIAGNOSIS — I10 Essential (primary) hypertension: Secondary | ICD-10-CM

## 2020-06-29 DIAGNOSIS — E782 Mixed hyperlipidemia: Secondary | ICD-10-CM | POA: Insufficient documentation

## 2020-06-29 DIAGNOSIS — G4733 Obstructive sleep apnea (adult) (pediatric): Secondary | ICD-10-CM

## 2020-06-29 DIAGNOSIS — Z23 Encounter for immunization: Secondary | ICD-10-CM | POA: Diagnosis not present

## 2020-06-29 DIAGNOSIS — Z8616 Personal history of COVID-19: Secondary | ICD-10-CM | POA: Insufficient documentation

## 2020-06-29 DIAGNOSIS — K219 Gastro-esophageal reflux disease without esophagitis: Secondary | ICD-10-CM

## 2020-06-29 NOTE — Assessment & Plan Note (Signed)
Chronic, stable on Prilosec.  Continue current medication regimen and consider reduction in future.  Mag level today.  Return in 6 months.

## 2020-06-29 NOTE — Assessment & Plan Note (Signed)
Chronic, stable. Continue with CPAP qHS.

## 2020-06-29 NOTE — Patient Instructions (Signed)
DASH Eating Plan DASH stands for "Dietary Approaches to Stop Hypertension." The DASH eating plan is a healthy eating plan that has been shown to reduce high blood pressure (hypertension). It may also reduce your risk for type 2 diabetes, heart disease, and stroke. The DASH eating plan may also help with weight loss. What are tips for following this plan?  General guidelines  Avoid eating more than 2,300 mg (milligrams) of salt (sodium) a day. If you have hypertension, you may need to reduce your sodium intake to 1,500 mg a day.  Limit alcohol intake to no more than 1 drink a day for nonpregnant women and 2 drinks a day for men. One drink equals 12 oz of beer, 5 oz of wine, or 1 oz of hard liquor.  Work with your health care provider to maintain a healthy body weight or to lose weight. Ask what an ideal weight is for you.  Get at least 30 minutes of exercise that causes your heart to beat faster (aerobic exercise) most days of the week. Activities may include walking, swimming, or biking.  Work with your health care provider or diet and nutrition specialist (dietitian) to adjust your eating plan to your individual calorie needs. Reading food labels   Check food labels for the amount of sodium per serving. Choose foods with less than 5 percent of the Daily Value of sodium. Generally, foods with less than 300 mg of sodium per serving fit into this eating plan.  To find whole grains, look for the word "whole" as the first word in the ingredient list. Shopping  Buy products labeled as "low-sodium" or "no salt added."  Buy fresh foods. Avoid canned foods and premade or frozen meals. Cooking  Avoid adding salt when cooking. Use salt-free seasonings or herbs instead of table salt or sea salt. Check with your health care provider or pharmacist before using salt substitutes.  Do not fry foods. Cook foods using healthy methods such as baking, boiling, grilling, and broiling instead.  Cook with  heart-healthy oils, such as olive, canola, soybean, or sunflower oil. Meal planning  Eat a balanced diet that includes: ? 5 or more servings of fruits and vegetables each day. At each meal, try to fill half of your plate with fruits and vegetables. ? Up to 6-8 servings of whole grains each day. ? Less than 6 oz of lean meat, poultry, or fish each day. A 3-oz serving of meat is about the same size as a deck of cards. One egg equals 1 oz. ? 2 servings of low-fat dairy each day. ? A serving of nuts, seeds, or beans 5 times each week. ? Heart-healthy fats. Healthy fats called Omega-3 fatty acids are found in foods such as flaxseeds and coldwater fish, like sardines, salmon, and mackerel.  Limit how much you eat of the following: ? Canned or prepackaged foods. ? Food that is high in trans fat, such as fried foods. ? Food that is high in saturated fat, such as fatty meat. ? Sweets, desserts, sugary drinks, and other foods with added sugar. ? Full-fat dairy products.  Do not salt foods before eating.  Try to eat at least 2 vegetarian meals each week.  Eat more home-cooked food and less restaurant, buffet, and fast food.  When eating at a restaurant, ask that your food be prepared with less salt or no salt, if possible. What foods are recommended? The items listed may not be a complete list. Talk with your dietitian about   what dietary choices are best for you. Grains Whole-grain or whole-wheat bread. Whole-grain or whole-wheat pasta. Brown rice. Oatmeal. Quinoa. Bulgur. Whole-grain and low-sodium cereals. Pita bread. Low-fat, low-sodium crackers. Whole-wheat flour tortillas. Vegetables Fresh or frozen vegetables (raw, steamed, roasted, or grilled). Low-sodium or reduced-sodium tomato and vegetable juice. Low-sodium or reduced-sodium tomato sauce and tomato paste. Low-sodium or reduced-sodium canned vegetables. Fruits All fresh, dried, or frozen fruit. Canned fruit in natural juice (without  added sugar). Meat and other protein foods Skinless chicken or turkey. Ground chicken or turkey. Pork with fat trimmed off. Fish and seafood. Egg whites. Dried beans, peas, or lentils. Unsalted nuts, nut butters, and seeds. Unsalted canned beans. Lean cuts of beef with fat trimmed off. Low-sodium, lean deli meat. Dairy Low-fat (1%) or fat-free (skim) milk. Fat-free, low-fat, or reduced-fat cheeses. Nonfat, low-sodium ricotta or cottage cheese. Low-fat or nonfat yogurt. Low-fat, low-sodium cheese. Fats and oils Soft margarine without trans fats. Vegetable oil. Low-fat, reduced-fat, or light mayonnaise and salad dressings (reduced-sodium). Canola, safflower, olive, soybean, and sunflower oils. Avocado. Seasoning and other foods Herbs. Spices. Seasoning mixes without salt. Unsalted popcorn and pretzels. Fat-free sweets. What foods are not recommended? The items listed may not be a complete list. Talk with your dietitian about what dietary choices are best for you. Grains Baked goods made with fat, such as croissants, muffins, or some breads. Dry pasta or rice meal packs. Vegetables Creamed or fried vegetables. Vegetables in a cheese sauce. Regular canned vegetables (not low-sodium or reduced-sodium). Regular canned tomato sauce and paste (not low-sodium or reduced-sodium). Regular tomato and vegetable juice (not low-sodium or reduced-sodium). Pickles. Olives. Fruits Canned fruit in a light or heavy syrup. Fried fruit. Fruit in cream or butter sauce. Meat and other protein foods Fatty cuts of meat. Ribs. Fried meat. Bacon. Sausage. Bologna and other processed lunch meats. Salami. Fatback. Hotdogs. Bratwurst. Salted nuts and seeds. Canned beans with added salt. Canned or smoked fish. Whole eggs or egg yolks. Chicken or turkey with skin. Dairy Whole or 2% milk, cream, and half-and-half. Whole or full-fat cream cheese. Whole-fat or sweetened yogurt. Full-fat cheese. Nondairy creamers. Whipped toppings.  Processed cheese and cheese spreads. Fats and oils Butter. Stick margarine. Lard. Shortening. Ghee. Bacon fat. Tropical oils, such as coconut, palm kernel, or palm oil. Seasoning and other foods Salted popcorn and pretzels. Onion salt, garlic salt, seasoned salt, table salt, and sea salt. Worcestershire sauce. Tartar sauce. Barbecue sauce. Teriyaki sauce. Soy sauce, including reduced-sodium. Steak sauce. Canned and packaged gravies. Fish sauce. Oyster sauce. Cocktail sauce. Horseradish that you find on the shelf. Ketchup. Mustard. Meat flavorings and tenderizers. Bouillon cubes. Hot sauce and Tabasco sauce. Premade or packaged marinades. Premade or packaged taco seasonings. Relishes. Regular salad dressings. Where to find more information:  National Heart, Lung, and Blood Institute: www.nhlbi.nih.gov  American Heart Association: www.heart.org Summary  The DASH eating plan is a healthy eating plan that has been shown to reduce high blood pressure (hypertension). It may also reduce your risk for type 2 diabetes, heart disease, and stroke.  With the DASH eating plan, you should limit salt (sodium) intake to 2,300 mg a day. If you have hypertension, you may need to reduce your sodium intake to 1,500 mg a day.  When on the DASH eating plan, aim to eat more fresh fruits and vegetables, whole grains, lean proteins, low-fat dairy, and heart-healthy fats.  Work with your health care provider or diet and nutrition specialist (dietitian) to adjust your eating plan to your   individual calorie needs. This information is not intended to replace advice given to you by your health care provider. Make sure you discuss any questions you have with your health care provider. Document Revised: 09/21/2017 Document Reviewed: 10/02/2016 Elsevier Patient Education  2020 Elsevier Inc.  

## 2020-06-29 NOTE — Progress Notes (Signed)
BP 134/79   Pulse 72   Temp 97.9 F (36.6 C) (Oral)   Wt 257 lb (116.6 kg)   SpO2 99%   BMI 39.08 kg/m    Subjective:    Patient ID: Grant Diaz, male    DOB: [birth year], 69 y.o.   MRN: [id number]  HPI: Grant Diaz is a 68 y.o. male  Chief Complaint  Patient presents with  . Hypertension  . Hyperlipidemia  . Gastroesophageal Reflux   HYPERTENSION Continues on Amlodipine, Lisinopril-HCTZ, and ASA.  Last LDL check in March 2020 was 96.  Uses CPAP at home consistently. Hypertension status: stable  Satisfied with current treatment? yes Duration of hypertension: chronic BP monitoring frequency:  occasionally BP range: 130/70 range BP medication side effects:  no Medication compliance: good compliance Aspirin: yes Recurrent headaches: no Visual changes: no Palpitations: no Dyspnea: no Chest pain: no Lower extremity edema: no Dizzy/lightheaded: no  The 10-year ASCVD risk score Mikey Bussing DC Jr., et al., 2013) is: 20%   Values used to calculate the score:     Age: 73 years     Sex: Male     Is Non-Hispanic African American: No     Diabetic: No     Tobacco smoker: No     Systolic Blood Pressure: 102 mmHg     Is BP treated: Yes     HDL Cholesterol: 48 mg/dL     Total Cholesterol: 152 mg/dL   GERD Continues on Omeprazole, once every other day. GERD control status: stable  Satisfied with current treatment? yes Heartburn frequency:  Medication side effects: no  Dysphagia: no Odynophagia:  no Hematemesis: no Blood in stool: no EGD: no   HISTORY OF PROSTATE CANCER: Followed by urology, Dr. Bernardo Heater, and last seen on 06/02/20 -- had some bleeding in July but none since.  History of Gleason 3+4, 12/2005 treated with IMRT/brachytherapy.  Last cystoscopy was 05/2018.  At recent visit it was recommend he have reevaluation with CT urogram and cystoscopy -- coming up September 15th and 20th.  Relevant past medical, surgical, family and social history reviewed and updated  as indicated. Interim medical history since our last visit reviewed. Allergies and medications reviewed and updated.  Review of Systems  Constitutional: Negative for activity change, diaphoresis, fatigue and fever.  Respiratory: Negative for cough, chest tightness, shortness of breath and wheezing.   Cardiovascular: Negative for chest pain, palpitations and leg swelling.  Gastrointestinal: Negative.   Neurological: Negative.   Psychiatric/Behavioral: Negative.     Per HPI unless specifically indicated above     Objective:    BP 134/79   Pulse 72   Temp 97.9 F (36.6 C) (Oral)   Wt 257 lb (116.6 kg)   SpO2 99%   BMI 39.08 kg/m   Wt Readings from Last 3 Encounters:  06/29/20 257 lb (116.6 kg)  06/02/20 248 lb (112.5 kg)  03/24/20 248 lb (112.5 kg)    Physical Exam Vitals and nursing note reviewed.  Constitutional:      General: He is awake. He is not in acute distress.    Appearance: He is well-developed. He is obese. He is not ill-appearing.  HENT:     Head: Normocephalic and atraumatic.     Right Ear: Hearing normal. No drainage.     Left Ear: Hearing normal. No drainage.  Eyes:     General: Lids are normal.        Right eye: No discharge.  Left eye: No discharge.     Conjunctiva/sclera: Conjunctivae normal.     Pupils: Pupils are equal, round, and reactive to light.  Neck:     Vascular: No carotid bruit.  Cardiovascular:     Rate and Rhythm: Normal rate and regular rhythm.     Heart sounds: Normal heart sounds, S1 normal and S2 normal. No murmur heard.  No gallop.   Pulmonary:     Effort: Pulmonary effort is normal. No accessory muscle usage or respiratory distress.     Breath sounds: Normal breath sounds.  Abdominal:     General: Bowel sounds are normal.     Palpations: Abdomen is soft.  Musculoskeletal:        General: Normal range of motion.     Cervical back: Normal range of motion and neck supple.     Right lower leg: No edema.     Left lower  leg: No edema.  Skin:    General: Skin is warm and dry.  Neurological:     Mental Status: He is alert and oriented to person, place, and time.  Psychiatric:        Attention and Perception: Attention normal.        Mood and Affect: Mood normal.        Speech: Speech normal.        Behavior: Behavior normal. Behavior is cooperative.        Thought Content: Thought content normal.     Results for orders placed or performed in visit on 06/02/20  Microscopic Examination   Urine  Result Value Ref Range   WBC, UA 0-5 0 - 5 /hpf   RBC 0-2 0 - 2 /hpf   Epithelial Cells (non renal) None seen 0 - 10 /hpf   Bacteria, UA None seen None seen/Few  Urinalysis, Complete  Result Value Ref Range   Specific Gravity, UA 1.020 1.005 - 1.030   pH, UA 7.0 5.0 - 7.5   Color, UA Orange Yellow   Appearance Ur Cloudy (A) Clear   Leukocytes,UA Negative Negative   Protein,UA Negative Negative/Trace   Glucose, UA Negative Negative   Ketones, UA Trace (A) Negative   RBC, UA Negative Negative   Bilirubin, UA Negative Negative   Urobilinogen, Ur 1.0 0.2 - 1.0 mg/dL   Nitrite, UA Negative Negative   Microscopic Examination See below:       Assessment & Plan:   Problem List Items Addressed This Visit      Cardiovascular and Mediastinum   Essential hypertension, benign - Primary    Chronic, stable. No change to medication regimen, continue as is and adjust as needed.  Recommend he monitor BP at home weekly and document for provider + focus on DASH diet. BMP and TSH today. Follow-up at end of January.      Relevant Orders   Basic metabolic panel   Lipid Panel w/o Chol/HDL Ratio   TSH     Respiratory   Sleep apnea    Chronic, stable. Continue with CPAP qHS.         Digestive   GERD (gastroesophageal reflux disease)    Chronic, stable on Prilosec.  Continue current medication regimen and consider reduction in future.  Mag level today.  Return in 6 months.      Relevant Orders   Magnesium      Other   Obesity (BMI 35.0-39.9 without comorbidity)    Recommended eating smaller high protein, low fat meals more frequently and  exercising 30 mins a day 5 times a week with a goal of 10-15lb weight loss in the next 3 months. Patient voiced their understanding and motivation to adhere to these recommendations.       History of prostate cancer    PSA up to date. Continue to collaborate with Dr. Bernardo Heater with care and future needs.       Elevated LDL cholesterol level    Current ASCVE 20%, discussed with patient.  Will recheck lipid panel today and consider addition of statin therapy if elevations noted.  Educated patient on statin use and side effects.       Other Visit Diagnoses    Flu vaccine need       Flu vaccine today   Relevant Orders   Flu Vaccine QUAD High Dose(Fluad)   Need for pneumococcal vaccine       Relevant Orders   Pneumococcal polysaccharide vaccine 23-valent greater than or equal to 2yo subcutaneous/IM       Follow up plan: Return in about 5 months (around 11/15/2020) for HTN and GERD.

## 2020-06-29 NOTE — Assessment & Plan Note (Signed)
Recommended eating smaller high protein, low fat meals more frequently and exercising 30 mins a day 5 times a week with a goal of 10-15lb weight loss in the next 3 months. Patient voiced their understanding and motivation to adhere to these recommendations.  

## 2020-06-29 NOTE — Assessment & Plan Note (Signed)
Chronic, stable. No change to medication regimen, continue as is and adjust as needed.  Recommend he monitor BP at home weekly and document for provider + focus on DASH diet. BMP and TSH today. Follow-up at end of January.

## 2020-06-29 NOTE — Assessment & Plan Note (Signed)
Current ASCVE 20%, discussed with patient.  Will recheck lipid panel today and consider addition of statin therapy if elevations noted.  Educated patient on statin use and side effects.

## 2020-06-29 NOTE — Assessment & Plan Note (Signed)
PSA up to date. Continue to collaborate with Dr. Bernardo Heater with care and future needs.

## 2020-06-30 LAB — BASIC METABOLIC PANEL
BUN/Creatinine Ratio: 27 — ABNORMAL HIGH (ref 10–24)
BUN: 22 mg/dL (ref 8–27)
CO2: 25 mmol/L (ref 20–29)
Calcium: 9.4 mg/dL (ref 8.6–10.2)
Chloride: 104 mmol/L (ref 96–106)
Creatinine, Ser: 0.83 mg/dL (ref 0.76–1.27)
GFR calc Af Amer: 103 mL/min/{1.73_m2} (ref >59)
GFR calc non Af Amer: 89 mL/min/{1.73_m2} (ref >59)
Glucose: 80 mg/dL (ref 65–99)
Potassium: 3.8 mmol/L (ref 3.5–5.2)
Sodium: 142 mmol/L (ref 134–144)

## 2020-06-30 LAB — LIPID PANEL W/O CHOL/HDL RATIO
Cholesterol, Total: 165 mg/dL (ref 100–199)
HDL: 45 mg/dL (ref >39)
LDL Chol Calc (NIH): 104 mg/dL — ABNORMAL HIGH (ref 0–99)
Triglycerides: 87 mg/dL (ref 0–149)
VLDL Cholesterol Cal: 16 mg/dL (ref 5–40)

## 2020-06-30 LAB — TSH: TSH: 1.09 u[IU]/mL (ref 0.450–4.500)

## 2020-06-30 LAB — MAGNESIUM: Magnesium: 1.8 mg/dL (ref 1.6–2.3)

## 2020-06-30 NOTE — Progress Notes (Signed)
Contacted via MyChart The 10-year ASCVD risk score Mikey Bussing DC Jr., et al., 2013) is: 21.4%   Values used to calculate the score:     Age: 69 years     Sex: Male     Is Non-Hispanic African American: No     Diabetic: No     Tobacco smoker: No     Systolic Blood Pressure: 532 mmHg     Is BP treated: Yes     HDL Cholesterol: 45 mg/dL     Total Cholesterol: 165 mg/dL  Good morning Mr. Lafoy, your labs have returned and overall look good.  Kidney function, electrolytes, thyroid, and magnesium all normal.  Your cholesterol levels continue to show LDL, bad cholesterol, above stroke prevention goal.  Would be good to see this less than 70 and your current risk score as we discussed yesterday is 21.4%.  I would recommend trying a low dose statin to start, see if we can lower that LDL.  Would you like to try this?  IF so let me know and I can send in, then would like to see you back in office in 6 weeks to see how you are doing and recheck labs.  Any questions? Keep being awesome!!  Thank you for allowing me to participate in your care. Kindest regards, Reniya Mcclees

## 2020-07-07 ENCOUNTER — Encounter: Payer: Self-pay | Admitting: Urology

## 2020-07-07 ENCOUNTER — Ambulatory Visit
Admission: RE | Admit: 2020-07-07 | Discharge: 2020-07-07 | Disposition: A | Discharge status: Home / Self Care | Payer: Medicare PPO | Source: Ambulatory Visit | Attending: Urology | Admitting: Urology

## 2020-07-07 ENCOUNTER — Other Ambulatory Visit: Payer: Self-pay

## 2020-07-07 DIAGNOSIS — R310 Gross hematuria: Secondary | ICD-10-CM | POA: Insufficient documentation

## 2020-07-07 MED ORDER — IOHEXOL 300 MG/ML  SOLN
125.0000 mL | Freq: Once | INTRAMUSCULAR | Status: AC | PRN
  Administered 2020-07-07: 125 mL via INTRAVENOUS

## 2020-07-11 NOTE — Progress Notes (Signed)
   07/12/2020  CC:  Chief Complaint  Patient presents with  . Cysto    GUR:KYHCW Grant Diaz is a 69 y.o. male who returns for a 1 month follow up of gross hematuria.    Prior evaluation 2019  Hematuria resolved however recurred 8/7  Some lower abdominal discomfort and constipation with that episode  Has had intermittent hematuria since 8/7  CTU on 07/07/20 showed no CT findings to account for the patient's hematuria. 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    Blood pressure (!) 160/77, pulse 79, height 5\' 8"  (1.727 m), weight 254 lb 14.4 oz (115.6 kg). NED. A&Ox3.   No respiratory distress   Abd soft, NT, ND Normal phallus with bilateral descended testicles  Cystoscopy Procedure Note  Patient identification was confirmed, informed consent was obtained, and patient was prepped using Betadine solution.  Lidocaine jelly was administered per urethral meatus.     Pre-Procedure: - Inspection reveals a normal caliber ureteral meatus.  Procedure: The flexible cystoscope was introduced without difficulty - Wide caliber bulbous urethral strictures - Mildly enlarged prostate with hypervascularity  - Normal bladder neck - Bilateral ureteral orifices identified - Bladder mucosa  reveals no ulcers, tumors, or lesions - No bladder stones - No trabeculation  Retroflexion shows bladder neck hypervascularity   Post-Procedure: - Patient tolerated the procedure well  Assessment/ Plan:  1.Gross hematuria  Hematuria most likely prostatic in origin  Urine cytology sent.   Regular follow-up scheduled February 2022 with PSA.  Since seen earlier he will keep his lab visit for PSA and will not need an office visit.     Grant Diaz, am acting as a scribe for Dr. Nicki Reaper C. Rylinn Diaz,  I have reviewed the above documentation for accuracy and completeness, and I agree with the above.   Grant Sons, MD

## 2020-07-12 ENCOUNTER — Encounter: Payer: Self-pay | Admitting: Urology

## 2020-07-12 ENCOUNTER — Other Ambulatory Visit: Payer: Self-pay

## 2020-07-12 ENCOUNTER — Other Ambulatory Visit: Payer: Self-pay | Admitting: Urology

## 2020-07-12 ENCOUNTER — Ambulatory Visit: Payer: Medicare PPO | Admitting: Urology

## 2020-07-12 VITALS — BP 160/77 | HR 79 | Ht 68.0 in | Wt 254.9 lb

## 2020-07-12 DIAGNOSIS — R310 Gross hematuria: Secondary | ICD-10-CM

## 2020-07-13 ENCOUNTER — Encounter: Payer: Self-pay | Admitting: Urology

## 2020-07-13 LAB — URINALYSIS, COMPLETE
Bilirubin, UA: NEGATIVE
Glucose, UA: NEGATIVE
Ketones, UA: NEGATIVE
Leukocytes,UA: NEGATIVE
Nitrite, UA: NEGATIVE
Protein,UA: NEGATIVE
RBC, UA: NEGATIVE
Specific Gravity, UA: 1.025 (ref 1.005–1.030)
Urobilinogen, Ur: 0.2 mg/dL (ref 0.2–1.0)
pH, UA: 7.5 (ref 5.0–7.5)

## 2020-07-13 LAB — MICROSCOPIC EXAMINATION: Bacteria, UA: NONE SEEN

## 2020-07-13 LAB — CYTOLOGY - NON PAP

## 2020-07-14 ENCOUNTER — Telehealth: Payer: Self-pay | Admitting: *Deleted

## 2020-07-14 NOTE — Telephone Encounter (Signed)
-----   Message from Abbie Sons, MD sent at 07/13/2020 11:30 PM EDT ----- Urine cytology showed no abnormal cells

## 2020-07-14 NOTE — Telephone Encounter (Signed)
Notified patient as instructed, patient pleased. Discussed follow-up appointments, patient agrees  

## 2020-07-23 DIAGNOSIS — M2142 Flat foot [pes planus] (acquired), left foot: Secondary | ICD-10-CM | POA: Insufficient documentation

## 2020-08-26 ENCOUNTER — Ambulatory Visit: Payer: Medicare PPO | Admitting: Nurse Practitioner

## 2020-09-07 ENCOUNTER — Other Ambulatory Visit: Payer: Self-pay

## 2020-09-07 ENCOUNTER — Encounter: Payer: Self-pay | Admitting: Nurse Practitioner

## 2020-09-07 ENCOUNTER — Ambulatory Visit (INDEPENDENT_AMBULATORY_CARE_PROVIDER_SITE_OTHER): Payer: Medicare PPO | Admitting: Nurse Practitioner

## 2020-09-07 VITALS — BP 138/78 | HR 64 | Temp 97.7°F | Wt 259.0 lb

## 2020-09-07 DIAGNOSIS — K219 Gastro-esophageal reflux disease without esophagitis: Secondary | ICD-10-CM | POA: Diagnosis not present

## 2020-09-07 MED ORDER — OMEPRAZOLE 20 MG PO CPDR: 20.0000 mg | DELAYED_RELEASE_CAPSULE | Freq: Every day | ORAL | 4 refills | Status: DC

## 2020-09-07 NOTE — Assessment & Plan Note (Signed)
Chronic, ongoing with poor control at this time.  However, improved symptoms over psat 2 weeks with cutting out milk products.  Continue current medication regimen and recommend he start taking Prilosec daily vs every other day.  Check CMP, CBC, Mag, TSH, Amylase, Lipase today.  Return as scheduled in February, sooner if worsening.  May benefit from GI visit and imaging if worsening or ongoing.

## 2020-09-07 NOTE — Patient Instructions (Signed)

## 2020-09-07 NOTE — Progress Notes (Signed)
BP 138/78    Pulse 64    Temp 97.7 F (36.5 C)    Wt 259 lb (117.5 kg)    SpO2 99%    BMI 39.38 kg/m    Subjective:    Patient ID: Grant Diaz, male    DOB: [birth year], 69 y.o.   MRN: [id number]  HPI: Grant Diaz is a 69 y.o. male  Chief Complaint  Patient presents with   Gastroesophageal Reflux    pt states for a few weeks he feels more occasional  indegestion after his afternoon evening meal, pt states he is sitting stright up when it occurs. Pt states Gas-x helps ease the pain, states if he doesn't take gas-x he feels chills    GERD Has been present for "probably almost a year", but seems to have intensified.  Has noticed cutting back on milk has helped.  Notices it most after evening meal, seldom in morning/lunch/or at night time -- not every day.  Taking Gas-X plus helps, if he does not catch it in time and take medication -- will have chills -- "feels like flu".  Then next morning will be feeling better. Had lap band placed 8 years ago.  Is taking Prilosec once every other day.   Denies any nausea or vomiting.  Has had no incidents in past 2-3 weeks -- has worked on avoiding food items, like milk, which has helped. GERD control status: uncontrolled  Satisfied with current treatment? yes Heartburn frequency:  Medication side effects: none Medication compliance: stable Previous GERD medications: Prilosec, Gas-Ex, Nexium Antacid use frequency:  once a week Nature: burning, intense Location: epigastric Heartburn duration: 1 to 1 1/2 hours, cyclical Alleviatiating factors:  Gas-Ex Aggravating factors: milk products Dysphagia: no Odynophagia:  no Hematemesis: no Blood in stool: no EGD: yes -- years ago -- had pancreatitis after procedure   Relevant past medical, surgical, family and social history reviewed and updated as indicated. Interim medical history since our last visit reviewed. Allergies and medications reviewed and updated.  Review of Systems   Constitutional: Negative for activity change, diaphoresis, fatigue and fever.  Respiratory: Negative for cough, chest tightness, shortness of breath and wheezing.   Cardiovascular: Negative for chest pain, palpitations and leg swelling.  Gastrointestinal: Positive for abdominal pain (epigastric) and constipation (occasional). Negative for abdominal distention, blood in stool, diarrhea, nausea and vomiting.  Neurological: Negative.   Psychiatric/Behavioral: Negative.     Per HPI unless specifically indicated above     Objective:    BP 138/78    Pulse 64    Temp 97.7 F (36.5 C)    Wt 259 lb (117.5 kg)    SpO2 99%    BMI 39.38 kg/m   Wt Readings from Last 3 Encounters:  09/07/20 259 lb (117.5 kg)  07/12/20 254 lb 14.4 oz (115.6 kg)  06/29/20 257 lb (116.6 kg)    Physical Exam Vitals and nursing note reviewed.  Constitutional:      General: He is awake. He is not in acute distress.    Appearance: He is well-developed. He is obese. He is not ill-appearing.  HENT:     Head: Normocephalic and atraumatic.     Right Ear: Hearing normal. No drainage.     Left Ear: Hearing normal. No drainage.  Eyes:     General: Lids are normal.        Right eye: No discharge.        Left eye: No discharge.  Conjunctiva/sclera: Conjunctivae normal.     Pupils: Pupils are equal, round, and reactive to light.  Neck:     Vascular: No carotid bruit.  Cardiovascular:     Rate and Rhythm: Normal rate and regular rhythm.     Heart sounds: Normal heart sounds, S1 normal and S2 normal. No murmur heard.  No gallop.   Pulmonary:     Effort: Pulmonary effort is normal. No accessory muscle usage or respiratory distress.     Breath sounds: Normal breath sounds.  Abdominal:     General: Bowel sounds are normal. There is no distension.     Palpations: Abdomen is soft.     Tenderness: There is no abdominal tenderness. There is no right CVA tenderness or left CVA tenderness.     Hernia: No hernia is  present.  Musculoskeletal:        General: Normal range of motion.     Cervical back: Normal range of motion and neck supple.     Right lower leg: No edema.     Left lower leg: No edema.  Skin:    General: Skin is warm and dry.  Neurological:     Mental Status: He is alert and oriented to person, place, and time.  Psychiatric:        Attention and Perception: Attention normal.        Mood and Affect: Mood normal.        Speech: Speech normal.        Behavior: Behavior normal. Behavior is cooperative.        Thought Content: Thought content normal.     Results for orders placed or performed in visit on 07/12/20  Cytology - Non PAP;  Result Value Ref Range   CYTOLOGY - NON GYN      Cytology - Non PAP CASE: [id number] PATIENT: Grant Diaz Non-Gynecological Cytology Report     Specimen Submitted: A. Urine  Clinical History: None provided      DIAGNOSIS: A. URINE; VOIDED: - NEGATIVE FOR HIGH-GRADE UROTHELIAL CARCINOMA. - BENIGN UROTHELIAL AND SQUAMOUS CELLS WITH DEGENERATIVE CHANGES.   GROSS DESCRIPTION: A. Labeled: Received labeled with the patient's name and date of birth (per requisition urine, voided) Received: Fresh Volume: 50 mL Description of fluid and container in which it is received: Received in a clear plastic specimen container with a yellow screw top lid is yellow clear fluid. Specimen material submitted for: ThinPrep  Final Diagnosis performed by Betsy Pries, MD.   Electronically signed 07/13/2020 3:41:45PM The electronic signature indicates that the named Attending Pathologist has evaluated the specimen Technical component performed at [facility], [address] [place], [place] [address] Lab: [phone] Dir: [place] Rod, MD, MMM  Professional component performed at [facility], [facility], [place], [place], [place] [address] Lab: [phone] Dir: Dellia Nims. Reuel [place], MD       Assessment & Plan:   Problem List  Items Addressed This Visit      Digestive   GERD (gastroesophageal reflux disease) - Primary    Chronic, ongoing with poor control at this time.  However, improved symptoms over psat 2 weeks with cutting out milk products.  Continue current medication regimen and recommend he start taking Prilosec daily vs every other day.  Check CMP, CBC, Mag, TSH, Amylase, Lipase today.  Return as scheduled in February, sooner if worsening.  May benefit from GI visit and imaging if worsening or ongoing.        Relevant Medications  omeprazole (PRILOSEC) 20 MG capsule   Other Relevant Orders   Comprehensive metabolic panel   CBC with Differential/Platelet   Lipase   Amylase   Magnesium   TSH       Follow up plan: Return for as scheduled in February, sooner if worsening symptoms.

## 2020-09-08 LAB — COMPREHENSIVE METABOLIC PANEL
ALT: 25 IU/L (ref 0–44)
AST: 25 IU/L (ref 0–40)
Albumin/Globulin Ratio: 1.6 (ref 1.2–2.2)
Albumin: 4.2 g/dL (ref 3.8–4.8)
Alkaline Phosphatase: 118 IU/L (ref 44–121)
BUN/Creatinine Ratio: 21 (ref 10–24)
BUN: 17 mg/dL (ref 8–27)
Bilirubin Total: 0.6 mg/dL (ref 0.0–1.2)
CO2: 27 mmol/L (ref 20–29)
Calcium: 9.4 mg/dL (ref 8.6–10.2)
Chloride: 103 mmol/L (ref 96–106)
Creatinine, Ser: 0.81 mg/dL (ref 0.76–1.27)
GFR calc Af Amer: 104 mL/min/{1.73_m2} (ref >59)
GFR calc non Af Amer: 90 mL/min/{1.73_m2} (ref >59)
Globulin, Total: 2.6 g/dL (ref 1.5–4.5)
Glucose: 85 mg/dL (ref 65–99)
Potassium: 3.8 mmol/L (ref 3.5–5.2)
Sodium: 141 mmol/L (ref 134–144)
Total Protein: 6.8 g/dL (ref 6.0–8.5)

## 2020-09-08 LAB — CBC WITH DIFFERENTIAL/PLATELET
Basophils Absolute: 0 10*3/uL (ref 0.0–0.2)
Basos: 1 %
EOS (ABSOLUTE): 0.3 10*3/uL (ref 0.0–0.4)
Eos: 5 %
Hematocrit: 38 % (ref 37.5–51.0)
Hemoglobin: 13.1 g/dL (ref 13.0–17.7)
Immature Grans (Abs): 0 10*3/uL (ref 0.0–0.1)
Immature Granulocytes: 0 %
Lymphocytes Absolute: 1 10*3/uL (ref 0.7–3.1)
Lymphs: 18 %
MCH: 31 pg (ref 26.6–33.0)
MCHC: 34.5 g/dL (ref 31.5–35.7)
MCV: 90 fL (ref 79–97)
Monocytes Absolute: 0.6 10*3/uL (ref 0.1–0.9)
Monocytes: 11 %
Neutrophils Absolute: 3.6 10*3/uL (ref 1.4–7.0)
Neutrophils: 65 %
Platelets: 220 10*3/uL (ref 150–450)
RBC: 4.22 x10E6/uL (ref 4.14–5.80)
RDW: 13 % (ref 11.6–15.4)
WBC: 5.6 10*3/uL (ref 3.4–10.8)

## 2020-09-08 LAB — MAGNESIUM: Magnesium: 1.9 mg/dL (ref 1.6–2.3)

## 2020-09-08 LAB — AMYLASE: Amylase: 135 U/L — ABNORMAL HIGH (ref 31–110)

## 2020-09-08 LAB — LIPASE: Lipase: 25 U/L (ref 13–78)

## 2020-09-08 LAB — TSH: TSH: 1.41 u[IU]/mL (ref 0.450–4.500)

## 2020-09-08 NOTE — Progress Notes (Signed)
Contacted via [place] afternoon Grant Diaz, your labs have returned.  Overall they are within normal ranges with exception of mild elevation in Amylase -- one of the pancreas labs.  Lipase is normal though, this is another pancreas lab.  I suspect with your history you may have some underlying chronic pancreatitis, which we will monitor closely to ensure no acute incidents or worsening levels.  We will recheck next visit.  Any questions for me? Keep being awesome!!  Thank you for allowing me to participate in your care. Kindest regards, Mel Langan

## 2020-09-25 ENCOUNTER — Other Ambulatory Visit: Payer: Self-pay | Admitting: Nurse Practitioner

## 2020-09-25 NOTE — Telephone Encounter (Signed)
Requested Prescriptions  Pending Prescriptions Disp Refills  . amLODipine (NORVASC) 10 MG tablet [Pharmacy Med Name: amLODIPine Besylate 10 MG Oral Tablet] 90 tablet 0    Sig: Take 1 tablet by mouth once daily     Cardiovascular:  Calcium Channel Blockers Passed - 09/25/2020  6:52 AM      Passed - Last BP in normal range    BP Readings from Last 1 Encounters:  09/07/20 138/78         Passed - Valid encounter within last 6 months    Recent Outpatient Visits          2 weeks ago Gastroesophageal reflux disease without esophagitis   [facility] [place], [place] T, NP   2 months ago Essential hypertension, benign   [place], [place] T, NP   1 year ago Essential hypertension, benign   [place], [place] T, NP   1 year ago Leg cramp   [place], [place], DO   1 year ago Left leg pain   [place], Lilia Argue, [place]      Future Appointments            In 2 weeks Cannady, Barbaraann Faster, NP [facility], [place]   In 2 months Cannady, Barbaraann Faster, NP [facility], [place]   In 6 months  [facility], [place]   In 9 months Stoio[place], Ronda Fairly, MD [facility]           . lisinopril-hydrochlorothiazide (ZESTORETIC) 20-25 MG tablet [Pharmacy Med Name: Lisinopril-hydroCHLOROthiazide 20-25 MG Oral Tablet] 90 tablet 0    Sig: Take 1 tablet by mouth once daily     Cardiovascular:  ACEI + Diuretic Combos Passed - 09/25/2020  6:52 AM      Passed - Na in normal range and within 180 days    Sodium  Date Value Ref Range Status  09/07/2020 141 134 - 144 mmol/L Final         Passed - K in normal range and within 180 days    Potassium  Date Value Ref Range Status  09/07/2020 3.8 3.5 - 5.2 mmol/L Final         Passed - Cr in normal range and within 180 days    Creatinine, Ser  Date Value Ref Range Status  09/07/2020 0.81 0.76 - 1.27 mg/dL Final          Passed - Ca in normal range and within 180 days    Calcium  Date Value Ref Range Status  09/07/2020 9.4 8.6 - 10.2 mg/dL Final         Passed - Patient is not pregnant      Passed - Last BP in normal range    BP Readings from Last 1 Encounters:  09/07/20 138/78         Passed - Valid encounter within last 6 months    Recent Outpatient Visits          2 weeks ago Gastroesophageal reflux disease without esophagitis   [facility] [place], [place] T, NP   2 months ago Essential hypertension, benign   [place], [place] T, NP   1 year ago Essential hypertension, benign   [place], [place] T, NP   1 year ago Leg cramp   [place], Megan P, DO   1 year ago Left leg pain   [facility] [place], PA-C      Future Appointments            In 2 weeks Cannady, Barbaraann Faster, NP [facility], [place]   In 2 months [place], Barbaraann Faster, NP [facility], [place]   In 6 months  [facility], [place]   In 9 months Stoioff, Ronda Fairly, MD [facility]

## 2020-10-13 ENCOUNTER — Encounter: Payer: Self-pay | Admitting: Nurse Practitioner

## 2020-10-13 ENCOUNTER — Other Ambulatory Visit: Payer: Self-pay

## 2020-10-13 ENCOUNTER — Ambulatory Visit (INDEPENDENT_AMBULATORY_CARE_PROVIDER_SITE_OTHER): Payer: Medicare PPO | Admitting: Nurse Practitioner

## 2020-10-13 VITALS — BP 139/80 | HR 76 | Temp 98.1°F | Ht 69.69 in | Wt 258.4 lb

## 2020-10-13 DIAGNOSIS — I10 Essential (primary) hypertension: Secondary | ICD-10-CM

## 2020-10-13 DIAGNOSIS — E669 Obesity, unspecified: Secondary | ICD-10-CM | POA: Diagnosis not present

## 2020-10-13 DIAGNOSIS — K219 Gastro-esophageal reflux disease without esophagitis: Secondary | ICD-10-CM

## 2020-10-13 DIAGNOSIS — H9202 Otalgia, left ear: Secondary | ICD-10-CM | POA: Diagnosis not present

## 2020-10-13 LAB — URINALYSIS, ROUTINE W REFLEX MICROSCOPIC
Bilirubin, UA: NEGATIVE
Glucose, UA: NEGATIVE
Leukocytes,UA: NEGATIVE
Nitrite, UA: NEGATIVE
Protein,UA: NEGATIVE
RBC, UA: NEGATIVE
Specific Gravity, UA: 1.025 (ref 1.005–1.030)
Urobilinogen, Ur: 0.2 mg/dL (ref 0.2–1.0)
pH, UA: 6.5 (ref 5.0–7.5)

## 2020-10-13 MED ORDER — NEOMYCIN-POLYMYXIN-HC 3.5-10000-1 OT SOLN: 3.0000 [drp] | Freq: Three times a day (TID) | OTIC | 2 refills | Status: DC

## 2020-10-13 NOTE — Progress Notes (Signed)
BP 139/80   Pulse 76   Temp 98.1 F (36.7 C) (Oral)   Ht 5' 9.69" (1.77 m)   Wt 258 lb 6.4 oz (117.2 kg)   SpO2 100%   BMI 37.41 kg/m    Subjective:    Patient ID: Grant Diaz, male    DOB: 1951-08-25, 70 y.o.   MRN: [id number]  HPI: Grant Diaz is a 69 y.o. male  Chief Complaint  Patient presents with  . Hypertension  . Gastroesophageal Reflux   GERD Follow-up for heart burn, last episode of [facility] day.  Had recent episode of pancreatitis, has had in past with EGD.  Taking Prilosec every day which is helping. Recent labs continued to show mild elevation in Amylase, but improved Lipase.  GERD control status: controlled Satisfied with current treatment? yes Heartburn frequency: improved, last episodes with daily Prilosec Medication side effects: none Medication compliance: stable Previous GERD medications: Prilosec, Gas-Ex, Nexium Antacid use frequency:  once a week Nature: burning, intense Location: epigastric Heartburn duration: 30 minutes Alleviatiating factors:  Gas-Ex Aggravating factors: milk products Dysphagia: no Odynophagia:  no Hematemesis: no Blood in stool: no EGD: yes -- years ago -- had pancreatitis after procedure  HYPERTENSION Continues on Lisinopril-HCTZ.   Hypertension status: stable  Satisfied with current treatment? yes Duration of hypertension: chronic BP monitoring frequency:  a few times a month BP range: 130/80 range -- occasional SBP 140 BP medication side effects:  no Medication compliance: good compliance Aspirin: yes Recurrent headaches: no Visual changes: no Palpitations: no Dyspnea: no Chest pain: no Lower extremity edema: no Dizzy/lightheaded: no   EAR PAIN Recently having some left ear pain, has been leftover Neomycin drops and would like refilled, is noticing benefit. Duration: weeks Involved ear(s): left Severity:  mild  Quality: dull and aching Fever: no Otorrhea: no Upper respiratory infection  symptoms: no Pruritus: no Hearing loss: no Water immersion occasional Using Q-tips: no Recurrent otitis media: no Status: stable Treatments attempted: Neomycin drops   Relevant past medical, surgical, family and social history reviewed and updated as indicated. Interim medical history since our last visit reviewed. Allergies and medications reviewed and updated.  Review of Systems  Constitutional: Negative for activity change, diaphoresis, fatigue and fever.  Respiratory: Negative for cough, chest tightness, shortness of breath and wheezing.   Cardiovascular: Negative for chest pain, palpitations and leg swelling.  Gastrointestinal: Negative for abdominal distention, abdominal pain, blood in stool, constipation, diarrhea, nausea and vomiting.  Neurological: Negative.   Psychiatric/Behavioral: Negative.     Per HPI unless specifically indicated above     Objective:    BP 139/80   Pulse 76   Temp 98.1 F (36.7 C) (Oral)   Ht 5' 9.69" (1.77 m)   Wt 258 lb 6.4 oz (117.2 kg)   SpO2 100%   BMI 37.41 kg/m   Wt Readings from Last 3 Encounters:  10/13/20 258 lb 6.4 oz (117.2 kg)  09/07/20 259 lb (117.5 kg)  07/12/20 254 lb 14.4 oz (115.6 kg)    Physical Exam Vitals and nursing note reviewed.  Constitutional:      General: He is awake. He is not in acute distress.    Appearance: He is well-developed. He is obese. He is not ill-appearing.  HENT:     Head: Normocephalic and atraumatic.     Right Ear: Hearing and tympanic membrane normal. No drainage.     Left Ear: Hearing and tympanic membrane normal. No drainage.  Ears:     Comments: Mild erythema in left ear canal. Eyes:     General: Lids are normal.        Right eye: No discharge.        Left eye: No discharge.     Conjunctiva/sclera: Conjunctivae normal.     Pupils: Pupils are equal, round, and reactive to light.  Neck:     Vascular: No carotid bruit.  Cardiovascular:     Rate and Rhythm: Normal rate and  regular rhythm.     Heart sounds: Normal heart sounds, S1 normal and S2 normal. No murmur heard. No gallop.   Pulmonary:     Effort: Pulmonary effort is normal. No accessory muscle usage or respiratory distress.     Breath sounds: Normal breath sounds.  Abdominal:     General: Bowel sounds are normal. There is no distension.     Palpations: Abdomen is soft.     Tenderness: There is no abdominal tenderness. There is no right CVA tenderness or left CVA tenderness.     Hernia: No hernia is present.  Musculoskeletal:        General: Normal range of motion.     Cervical back: Normal range of motion and neck supple.     Right lower leg: No edema.     Left lower leg: No edema.  Skin:    General: Skin is warm and dry.  Neurological:     Mental Status: He is alert and oriented to person, place, and time.  Psychiatric:        Attention and Perception: Attention normal.        Mood and Affect: Mood normal.        Speech: Speech normal.        Behavior: Behavior normal. Behavior is cooperative.        Thought Content: Thought content normal.     Results for orders placed or performed in visit on 09/07/20  Comprehensive metabolic panel  Result Value Ref Range   Glucose 85 65 - 99 mg/dL   BUN 17 8 - 27 mg/dL   Creatinine, Ser 0.81 0.76 - 1.27 mg/dL   GFR calc non Af Amer 90 >59 mL/min/1.73   GFR calc Af Amer 104 >59 mL/min/1.73   BUN/Creatinine Ratio 21 10 - 24   Sodium 141 134 - 144 mmol/L   Potassium 3.8 3.5 - 5.2 mmol/L   Chloride 103 96 - 106 mmol/L   CO2 27 20 - 29 mmol/L   Calcium 9.4 8.6 - 10.2 mg/dL   Total Protein 6.8 6.0 - 8.5 g/dL   Albumin 4.2 3.8 - 4.8 g/dL   Globulin, Total 2.6 1.5 - 4.5 g/dL   Albumin/Globulin Ratio 1.6 1.2 - 2.2   Bilirubin Total 0.6 0.0 - 1.2 mg/dL   Alkaline Phosphatase 118 44 - 121 IU/L   AST 25 0 - 40 IU/L   ALT 25 0 - 44 IU/L  CBC with Differential/Platelet  Result Value Ref Range   WBC 5.6 3.4 - 10.8 x10E3/uL   RBC 4.22 4.14 - 5.80  x10E6/uL   Hemoglobin 13.1 13.0 - 17.7 g/dL   Hematocrit 38.0 37.5 - 51.0 %   MCV 90 79 - 97 fL   MCH 31.0 26.6 - 33.0 pg   MCHC 34.5 31.5 - 35.7 g/dL   RDW 13.0 11.6 - 15.4 %   Platelets 220 150 - 450 x10E3/uL   Neutrophils 65 Not Estab. %   Lymphs 18 Not Estab. %  Monocytes 11 Not Estab. %   Eos 5 Not Estab. %   Basos 1 Not Estab. %   Neutrophils Absolute 3.6 1.4 - 7.0 x10E3/uL   Lymphocytes Absolute 1.0 0.7 - 3.1 x10E3/uL   Monocytes Absolute 0.6 0.1 - 0.9 x10E3/uL   EOS (ABSOLUTE) 0.3 0.0 - 0.4 x10E3/uL   Basophils Absolute 0.0 0.0 - 0.2 x10E3/uL   Immature Granulocytes 0 Not Estab. %   Immature Grans (Abs) 0.0 0.0 - 0.1 x10E3/uL  Lipase  Result Value Ref Range   Lipase 25 13 - 78 U/L  Amylase  Result Value Ref Range   Amylase 135 (H) 31 - 110 U/L  Magnesium  Result Value Ref Range   Magnesium 1.9 1.6 - 2.3 mg/dL  TSH  Result Value Ref Range   TSH 1.410 0.450 - 4.500 uIU/mL      Assessment & Plan:   Problem List Items Addressed This Visit      Cardiovascular and Mediastinum   Essential hypertension, benign    Chronic, stable. No change to medication regimen, continue as is and adjust as needed.  Recommend he monitor BP at home weekly and document for provider + focus on DASH diet. BMP today. Follow-up in February for physical.        Digestive   GERD (gastroesophageal reflux disease) - Primary    Chronic, ongoing with improved control with daily Prilosec.   Continue current medication regimen and adjust as needed.  Recheck CMP, CBC, Amylase, Lipase, urine today.  Return as scheduled in February, sooner if worsening.  May benefit from GI visit and imaging if worsening or ongoing.        Relevant Orders   Amylase   Lipase   Comprehensive metabolic panel   CBC with Differential/Platelet   Urinalysis, Routine w reflex microscopic     Other   Obesity (BMI 35.0-39.9 without comorbidity)    Recommended eating smaller high protein, low fat meals more frequently  and exercising 30 mins a day 5 times a week with a goal of 10-15lb weight loss in the next 3 months. Patient voiced their understanding and motivation to adhere to these recommendations.       Left ear pain    Refill sent on Neomycin drops which are offering benefit at this time.          Follow up plan: Return for as scheduled in February for physical.

## 2020-10-13 NOTE — Assessment & Plan Note (Signed)
Recommended eating smaller high protein, low fat meals more frequently and exercising 30 mins a day 5 times a week with a goal of 10-15lb weight loss in the next 3 months. Patient voiced their understanding and motivation to adhere to these recommendations.  

## 2020-10-13 NOTE — Assessment & Plan Note (Signed)
Chronic, ongoing with improved control with daily Prilosec.   Continue current medication regimen and adjust as needed.  Recheck CMP, CBC, Amylase, Lipase, urine today.  Return as scheduled in February, sooner if worsening.  May benefit from GI visit and imaging if worsening or ongoing.

## 2020-10-13 NOTE — Patient Instructions (Signed)

## 2020-10-13 NOTE — Assessment & Plan Note (Signed)
Refill sent on Neomycin drops which are offering benefit at this time.

## 2020-10-13 NOTE — Assessment & Plan Note (Signed)
Chronic, stable. No change to medication regimen, continue as is and adjust as needed.  Recommend he monitor BP at home weekly and document for provider + focus on DASH diet. BMP today. Follow-up in February for physical.

## 2020-10-14 ENCOUNTER — Other Ambulatory Visit: Payer: Self-pay | Admitting: Nurse Practitioner

## 2020-10-14 DIAGNOSIS — Z8719 Personal history of other diseases of the digestive system: Secondary | ICD-10-CM

## 2020-10-14 LAB — CBC WITH DIFFERENTIAL/PLATELET
Basophils Absolute: 0.1 10*3/uL (ref 0.0–0.2)
Basos: 1 %
EOS (ABSOLUTE): 0.5 10*3/uL — ABNORMAL HIGH (ref 0.0–0.4)
Eos: 10 %
Hematocrit: 39.6 % (ref 37.5–51.0)
Hemoglobin: 13.6 g/dL (ref 13.0–17.7)
Immature Grans (Abs): 0 10*3/uL (ref 0.0–0.1)
Immature Granulocytes: 0 %
Lymphocytes Absolute: 1.1 10*3/uL (ref 0.7–3.1)
Lymphs: 20 %
MCH: 31.7 pg (ref 26.6–33.0)
MCHC: 34.3 g/dL (ref 31.5–35.7)
MCV: 92 fL (ref 79–97)
Monocytes Absolute: 0.6 10*3/uL (ref 0.1–0.9)
Monocytes: 11 %
Neutrophils Absolute: 3 10*3/uL (ref 1.4–7.0)
Neutrophils: 58 %
Platelets: 198 10*3/uL (ref 150–450)
RBC: 4.29 x10E6/uL (ref 4.14–5.80)
RDW: 13.1 % (ref 11.6–15.4)
WBC: 5.2 10*3/uL (ref 3.4–10.8)

## 2020-10-14 LAB — COMPREHENSIVE METABOLIC PANEL
ALT: 17 IU/L (ref 0–44)
AST: 23 IU/L (ref 0–40)
Albumin/Globulin Ratio: 1.4 (ref 1.2–2.2)
Albumin: 4 g/dL (ref 3.7–4.7)
Alkaline Phosphatase: 102 IU/L (ref 44–121)
BUN/Creatinine Ratio: 22 (ref 10–24)
BUN: 23 mg/dL (ref 8–27)
Bilirubin Total: 0.3 mg/dL (ref 0.0–1.2)
CO2: 22 mmol/L (ref 20–29)
Calcium: 9.4 mg/dL (ref 8.6–10.2)
Chloride: 104 mmol/L (ref 96–106)
Creatinine, Ser: 1.04 mg/dL (ref 0.76–1.27)
GFR calc Af Amer: 83 mL/min/{1.73_m2} (ref >59)
GFR calc non Af Amer: 72 mL/min/{1.73_m2} (ref >59)
Globulin, Total: 2.8 g/dL (ref 1.5–4.5)
Glucose: 75 mg/dL (ref 65–99)
Potassium: 3.8 mmol/L (ref 3.5–5.2)
Sodium: 143 mmol/L (ref 134–144)
Total Protein: 6.8 g/dL (ref 6.0–8.5)

## 2020-10-14 LAB — AMYLASE: Amylase: 116 U/L — ABNORMAL HIGH (ref 31–110)

## 2020-10-14 LAB — LIPASE: Lipase: 29 U/L (ref 13–78)

## 2020-10-14 NOTE — Progress Notes (Signed)
Contacted via MyChart -- please schedule lab visit for 4 weeks

## 2020-10-18 ENCOUNTER — Telehealth: Payer: Self-pay

## 2020-10-18 NOTE — Telephone Encounter (Signed)
That would be perfect. Thank you.

## 2020-10-18 NOTE — Telephone Encounter (Signed)
Pt asked if he could have the blood work done on physical as he lives far and does not want to make 2 trips to office. Pt has physical on 11/24/20 asked if ok to come in then for the lab.

## 2020-10-18 NOTE — Telephone Encounter (Signed)
-----   Message from Marjie Skiff, NP sent at 10/14/2020  4:50 PM EST ----- Contacted via MyChart -- please schedule lab visit for 4 weeks

## 2020-10-18 NOTE — Telephone Encounter (Signed)
Pt verbalized understanding,  

## 2020-10-27 ENCOUNTER — Other Ambulatory Visit: Payer: Self-pay | Admitting: Nurse Practitioner

## 2020-10-27 MED ORDER — CELECOXIB 200 MG PO CAPS: 200.0000 mg | ORAL_CAPSULE | Freq: Every day | ORAL | 3 refills | Status: DC

## 2020-10-27 NOTE — Telephone Encounter (Signed)
Copied from CRM [id number]. Topic: Quick Communication - Rx Refill/Question >> Oct 27, 2020  3:31 PM Cuthrell, Pearlean Brownie wrote: Medication: celecoxib (CELEBREX) 200 MG capsule [[id number]] 200 mg (Patient would like PCP to start prescribing)   Has the patient contacted their pharmacy? yes  (Agent: If yes, when and what did the pharmacy advise?) contact pcp   Preferred Pharmacy (with phone number or street name):[facility] [address] ([address]), [place] - [address] ([address]) [place] [address] Phone: [phone] Fax: [phone] Hours: Not open 24 hours  Agent: Please be advised that RX refills may take up to 3 business days. We ask that you follow-up with your pharmacy.  Patient states that he is on his last pill which he will use tomorrow and is wondering if he can have his refill expedited, Patient states that he will be In town tomorrow because he usually stays about 25 mins from the pharmacy, Patient would like PCP to give him a follow up call, please advise

## 2020-10-27 NOTE — Telephone Encounter (Signed)
F/u call to patient concerning Rx request. No answer, left message to call clinic back for further questions.

## 2020-11-10 ENCOUNTER — Other Ambulatory Visit: Payer: Self-pay | Admitting: Nurse Practitioner

## 2020-11-10 MED ORDER — TRIAMCINOLONE ACETONIDE 0.025 % EX CREA: 1.0000 "application " | TOPICAL_CREAM | Freq: Two times a day (BID) | CUTANEOUS | 2 refills | Status: DC | PRN

## 2020-11-10 MED ORDER — KETOCONAZOLE 2 % EX CREA: 1.0000 "application " | TOPICAL_CREAM | Freq: Every day | CUTANEOUS | 3 refills | Status: DC | PRN

## 2020-11-24 ENCOUNTER — Ambulatory Visit (INDEPENDENT_AMBULATORY_CARE_PROVIDER_SITE_OTHER): Payer: Medicare PPO | Admitting: Nurse Practitioner

## 2020-11-24 ENCOUNTER — Encounter: Payer: Self-pay | Admitting: Nurse Practitioner

## 2020-11-24 ENCOUNTER — Other Ambulatory Visit: Payer: Self-pay

## 2020-11-24 VITALS — BP 132/82 | HR 60 | Temp 97.4°F | Ht 69.29 in | Wt 258.2 lb

## 2020-11-24 DIAGNOSIS — E7800 Pure hypercholesterolemia, unspecified: Secondary | ICD-10-CM | POA: Diagnosis not present

## 2020-11-24 DIAGNOSIS — K219 Gastro-esophageal reflux disease without esophagitis: Secondary | ICD-10-CM | POA: Diagnosis not present

## 2020-11-24 DIAGNOSIS — G4733 Obstructive sleep apnea (adult) (pediatric): Secondary | ICD-10-CM

## 2020-11-24 DIAGNOSIS — R748 Abnormal levels of other serum enzymes: Secondary | ICD-10-CM | POA: Insufficient documentation

## 2020-11-24 DIAGNOSIS — K449 Diaphragmatic hernia without obstruction or gangrene: Secondary | ICD-10-CM

## 2020-11-24 DIAGNOSIS — Z86711 Personal history of pulmonary embolism: Secondary | ICD-10-CM

## 2020-11-24 DIAGNOSIS — I10 Essential (primary) hypertension: Secondary | ICD-10-CM | POA: Diagnosis not present

## 2020-11-24 DIAGNOSIS — Z0000 Encounter for general adult medical examination without abnormal findings: Secondary | ICD-10-CM

## 2020-11-24 DIAGNOSIS — E669 Obesity, unspecified: Secondary | ICD-10-CM

## 2020-11-24 DIAGNOSIS — Z8546 Personal history of malignant neoplasm of prostate: Secondary | ICD-10-CM | POA: Diagnosis not present

## 2020-11-24 MED ORDER — AMLODIPINE BESYLATE 10 MG PO TABS: 10.0000 mg | ORAL_TABLET | Freq: Every day | ORAL | 4 refills | Status: DC

## 2020-11-24 MED ORDER — LISINOPRIL-HYDROCHLOROTHIAZIDE 20-25 MG PO TABS
1.0000 | ORAL_TABLET | Freq: Every day | ORAL | 4 refills | Status: DC
Start: 2020-11-24 — End: 2020-12-23

## 2020-11-24 NOTE — Assessment & Plan Note (Signed)
Chronic, ongoing with improved control with daily Prilosec.   Continue current medication regimen and adjust as needed.  Recheck CMP, CBC, Amylase, Lipase, today.  Return in 6 months, sooner dependent on labs.  May benefit from GI visit and imaging if worsening or ongoing elevations in labs -- he is agreeable to this.  Review labs tomorrow and determine need for referral.

## 2020-11-24 NOTE — Assessment & Plan Note (Signed)
Recheck today and if ongoing consider imaging and referral to GI.  Trending down last check with normal Lipase.

## 2020-11-24 NOTE — Assessment & Plan Note (Signed)
BMI 37.81.  Recommended eating smaller high protein, low fat meals more frequently and exercising 30 mins a day 5 times a week with a goal of 10-15lb weight loss in the next 3 months. Patient voiced their understanding and motivation to adhere to these recommendations.

## 2020-11-24 NOTE — Assessment & Plan Note (Signed)
Current ASCVD 22.4%, discussed with patient.  Will recheck lipid panel today and consider addition of statin therapy if elevations noted.  Educated patient on statin use and side effects.

## 2020-11-24 NOTE — Assessment & Plan Note (Signed)
Chronic, stable. Continue with CPAP QHS, 100% use 

## 2020-11-24 NOTE — Patient Instructions (Signed)
Food Choices for Gastroesophageal Reflux Disease, Adult When you have gastroesophageal reflux disease (GERD), the foods you eat and your eating habits are very important. Choosing the right foods can help ease your discomfort. Think about working with a food expert (dietitian) to help you make good choices. What are tips for following this plan? Reading food labels  Look for foods that are low in saturated fat. Foods that may help with your symptoms include: ? Foods that have less than 5% of daily value (DV) of fat. ? Foods that have 0 grams of trans fat. Cooking  Do not fry your food.  Cook your food by baking, steaming, grilling, or broiling. These are all methods that do not need a lot of fat for cooking.  To add flavor, try to use herbs that are low in spice and acidity. Meal planning  Choose healthy foods that are low in fat, such as: ? Fruits and vegetables. ? Whole grains. ? Low-fat dairy products. ? Lean meats, fish, and poultry.  Eat small meals often instead of eating 3 large meals each day. Eat your meals slowly in a place where you are relaxed. Avoid bending over or lying down until 2-3 hours after eating.  Limit high-fat foods such as fatty meats or fried foods.  Limit your intake of fatty foods, such as oils, butter, and shortening.  Avoid the following as told by your doctor: ? Foods that cause symptoms. These may be different for different people. Keep a food diary to keep track of foods that cause symptoms. ? Alcohol. ? Drinking a lot of liquid with meals. ? Eating meals during the 2-3 hours before bed.   Lifestyle  Stay at a healthy weight. Ask your doctor what weight is healthy for you. If you need to lose weight, work with your doctor to do so safely.  Exercise for at least 30 minutes on 5 or more days each week, or as told by your doctor.  Wear loose-fitting clothes.  Do not smoke or use any products that contain nicotine or tobacco. If you need help  quitting, ask your doctor.  Sleep with the head of your bed higher than your feet. Use a wedge under the mattress or blocks under the bed frame to raise the head of the bed.  Chew sugar-free gum after meals. What foods should eat? Eat a healthy, well-balanced diet of fruits, vegetables, whole grains, low-fat dairy products, lean meats, fish, and poultry. Each person is different. Foods that may cause symptoms in one person may not cause any symptoms in another person. Work with your doctor to find foods that are safe for you. The items listed above may not be a complete list of what you can eat and drink. Contact a food expert for more options.   What foods should I avoid? Limiting some of these foods may help in managing the symptoms of GERD. Everyone is different. Talk with a food expert or your doctor to help you find the exact foods to avoid, if any. Fruits Any fruits prepared with added fat. Any fruits that cause symptoms. For some people, this may include citrus fruits, such as oranges, grapefruit, pineapple, and lemons. Vegetables Deep-fried vegetables. French fries. Any vegetables prepared with added fat. Any vegetables that cause symptoms. For some people, this may include tomatoes and tomato products, chili peppers, onions and garlic, and horseradish. Grains Pastries or quick breads with added fat. Meats and other proteins High-fat meats, such as fatty beef or pork,   hot dogs, ribs, ham, sausage, salami, and bacon. Fried meat or protein, including fried fish and fried chicken. Nuts and nut butters, in large amounts. Dairy Whole milk and chocolate milk. Sour cream. Cream. Ice cream. Cream cheese. Milkshakes. Fats and oils Butter. Margarine. Shortening. Ghee. Beverages Coffee and tea, with or without caffeine. Carbonated beverages. Sodas. Energy drinks. Fruit juice made with acidic fruits, such as orange or grapefruit. Tomato juice. Alcoholic drinks. Sweets and desserts Chocolate and  cocoa. Donuts. Seasonings and condiments Pepper. Peppermint and spearmint. Added salt. Any condiments, herbs, or seasonings that cause symptoms. For some people, this may include curry, hot sauce, or vinegar-based salad dressings. The items listed above may not be a complete list of what you should not eat and drink. Contact a food expert for more options. Questions to ask your doctor Diet and lifestyle changes are often the first steps that are taken to manage symptoms of GERD. If diet and lifestyle changes do not help, talk with your doctor about taking medicines. Where to find more information  [employer]: aboutgerd.org Summary  When you have GERD, food and lifestyle choices are very important in easing your symptoms.  Eat small meals often instead of 3 large meals a day. Eat your meals slowly and in a place where you are relaxed.  Avoid bending over or lying down until 2-3 hours after eating.  Limit high-fat foods such as fatty meats or fried foods. This information is not intended to replace advice given to you by your health care provider. Make sure you discuss any questions you have with your health care provider. Document Revised: 04/19/2020 Document Reviewed: 04/19/2020 Elsevier Patient Education  2021 [employer]  

## 2020-11-24 NOTE — Assessment & Plan Note (Signed)
Stable.  PSA today. Continue to collaborate with Dr. Bernardo Heater with care and future needs.

## 2020-11-24 NOTE — Assessment & Plan Note (Signed)
Recommended continue daily aspirin. Continue to collaborate with Dr. Gollan for care and future needs.  

## 2020-11-24 NOTE — Assessment & Plan Note (Signed)
Chronic, stable with BP at goal today.  Recommend he monitor BP at least a few mornings a week at home and document.  DASH diet at home.  Continue current medication regimen and adjust as needed.  Labs today.  Refills sent in.  Return in 6 months.

## 2020-11-24 NOTE — Progress Notes (Signed)
BP 132/82 (BP Location: Left Arm)   Pulse 60 Comment: apical  Temp (!) 97.4 F (36.3 C) (Oral)   Ht 5' 9.29" (1.76 m)   Wt 258 lb 3.2 oz (117.1 kg)   SpO2 100%   BMI 37.81 kg/m    Subjective:    Patient ID: Grant Diaz, male    DOB: 11-11-1950, 70 y.o.   MRN: [id number]  HPI: Grant Diaz is a 70 y.o. male presenting on 11/24/2020 for comprehensive medical examination. Current medical complaints include:none.  He currently lives with: wife Interim Problems from his last visit: no  HYPERTENSION Pt taking amlodipine and lisinopril-hctz for HTN.  Hypertension status: controlled  Satisfied with current treatment? yes Duration of hypertension: chronic BP monitoring frequency: rarely BP range: 130/70 range BP medication side effects:  no Medication compliance: good compliance Aspirin: yes Recurrent headaches: no Visual changes: no Palpitations: no Dyspnea: no Chest pain: no Lower extremity edema: no Dizzy/lightheaded: no  The 10-year ASCVD risk score Grant Diaz DC Jr., et al., 2013) is: 22.4%   Values used to calculate the score:     Age: 32 years     Sex: Male     Is Non-Hispanic African American: No     Diabetic: No     Tobacco smoker: No     Systolic Blood Pressure: [id number] mmHg     Is BP treated: Yes     HDL Cholesterol: 45 mg/dL     Total Cholesterol: 165 mg/dL   GERD Had history of pancreatitis.  Has had EGD in past with hiatal hernia. Does have known hiatal hernia -- had history of lap band and mesh placed with hernia.   Taking Prilosec every day which is helping.  Recent labs continued to show mild elevation in Amylase (116), but improved Lipase (29).  In 2010 CT showed possible pancreatic mass, but MRI showed no mass.  GERD control status: controlled  Satisfied with current treatment? yes Heartburn frequency: 3-4 x a month Medication side effects: no  Medication compliance: stable Previous GERD medications: Antacid use frequency: "occasionally" in the evening  after a larger meal   Aggravating factors: Larger meals, greasy food Dysphagia: no - pt reports a lap band and food will "back up" with larger meals Odynophagia:  no Hematemesis: no Blood in stool: no EGD: yes - appx 4 years ago  SLEEP APNEA Endorses using CPAP every night and tolerating well. Denies insomnia, restless sleep, daytime sleepiness, depression, or confusion.    Hx PROSTATE CANCER Followed at [facility] by Dr. Bernardo Heater for hx of prostate cancer. Saw him last in September 2021 -- next sees in September 2022.  Pt reports current plan is to watch closely.  Urology notes review, patient requests PSA in house today and to forward result to Dr. Bernardo Heater.  Hx PULMONARY EMBOLUS Followed by [facility], Dr. Rockey Situ, for hx of pulmonary embolism. Currently taking 81mg  ASA. Endorses wearing compression stockings daily and avoidance of sitting for long periods of time. Denies any current concerns or issues.  Functional Status Survey: Is the patient deaf or have difficulty hearing?: No Does the patient have difficulty seeing, even when wearing glasses/contacts?: No Does the patient have difficulty concentrating, remembering, or making decisions?: No Does the patient have difficulty walking or climbing stairs?: No Does the patient have difficulty dressing or bathing?: No Does the patient have difficulty doing errands alone such as visiting a doctor's office or shopping?: No  FALL RISK: Fall Risk  11/24/2020 10/13/2020  03/24/2020 12/24/2018 09/25/2017  Falls in the past year? 0 0 0 0 No  Number falls in past yr: 0 0 0 0 -  Injury with Fall? - 0 0 0 -    Depression Screen Depression screen [facility] 2/9 11/24/2020 10/13/2020 03/24/2020 12/24/2018 09/25/2017  Decreased Interest 0 0 0 0 0  Down, Depressed, Hopeless 0 0 0 0 0  PHQ - 2 Score 0 0 0 0 0  Altered sleeping 0 - - - 0  Tired, decreased energy 0 - - - 0  Change in appetite 0 - - - 1  Feeling bad or failure about yourself  0 - - - 0   Trouble concentrating 0 - - - 0  Moving slowly or fidgety/restless 0 - - - 0  Suicidal thoughts 0 - - - 0  PHQ-9 Score 0 - - - 1     Past Medical History:  Past Medical History:  Diagnosis Date  . Arthritis   . Cancer [facility]) 2008   Prostate-radiation  . Cataract 2019   not yet significant enough to warrant surgery  . Chronic kidney disease    kidney stones  remote history  . GERD (gastroesophageal reflux disease)   . History of DVT (deep vein thrombosis)   . Hypertension   . Joint pain   . Pancreatitis 2013   [facility]  . Prostate cancer ([place])   . Pulmonary embolus [facility]) March 2016   Bilateral pulmonary embolus, [facility]  . Sleep apnea 2010   use CPAP regularly  . Sleep apnea, obstructive    cpap    Surgical History:  Past Surgical History:  Procedure Laterality Date  . CARPAL TUNNEL RELEASE Right 03/15/2017   Procedure: CARPAL TUNNEL RELEASE;  Surgeon: Hessie Knows, MD;  Location: [facility] ORS;  Service: Orthopedics;  Laterality: Right;  . CHOLECYSTECTOMY    . COLONOSCOPY WITH PROPOFOL N/A 04/02/2018   Procedure: COLONOSCOPY WITH PROPOFOL;  Surgeon: Lucilla Lame, MD;  Location: [facility] ENDOSCOPY;  Service: Endoscopy;  Laterality: N/A;  . ESOPHAGOGASTRODUODENOSCOPY N/A 05/28/2013   Procedure: ESOPHAGOGASTRODUODENOSCOPY (EGD);  Surgeon: Shann Medal, MD;  Location: [facility] ENDOSCOPY;  Service: General;  Laterality: N/A;  . GALLBLADDER SURGERY  2003  . GANGLION CYST EXCISION Right 03/15/2017   Procedure: REMOVAL GANGLION OF WRIST;  Surgeon: Hessie Knows, MD;  Location: [facility] ORS;  Service: Orthopedics;  Laterality: Right;  . HERNIA REPAIR    . hose  2011  . JOINT REPLACEMENT    . KNEE ARTHROSCOPY Bilateral [id number]  . LAPAROSCOPIC GASTRIC BANDING  2009   and hernia repair  . RADIATION IMPLANT W/ ULTRASOUND, MALE  2005   internal/external radiation for prostate cancer  . TOTAL KNEE ARTHROPLASTY Left 10/12/2015   Procedure: TOTAL KNEE ARTHROPLASTY;  Surgeon: Hessie Knows, MD;  Location:  [facility] ORS;  Service: Orthopedics;  Laterality: Left;  . TOTAL KNEE ARTHROPLASTY Right 05/15/2017   Procedure: TOTAL KNEE ARTHROPLASTY;  Surgeon: Hessie Knows, MD;  Location: [facility] ORS;  Service: Orthopedics;  Laterality: Right;  . UMBILICAL HERNIA REPAIR  2010    Medications:  Current Outpatient Medications on File Prior to Visit  Medication Sig  . acetaminophen (TYLENOL) 325 MG tablet Take 650 mg by mouth every 6 (six) hours as needed.  Marland Kitchen aspirin EC 81 MG tablet Take 81 mg by mouth daily.  . celecoxib (CELEBREX) 200 MG capsule Take 1 capsule (200 mg total) by mouth daily.  . Cholecalciferol 25 MCG (1000 UT) capsule Take 1,000 Units by mouth daily.  Marland Kitchen  ketoconazole (NIZORAL) 2 % cream Apply 1 application topically daily as needed.  . Multiple Vitamin (MULTIVITAMIN) capsule Take 1 capsule by mouth daily.  Marland Kitchen neomycin-polymyxin-hydrocortisone (CORTISPORIN) OTIC solution Place 3 drops into both ears 3 (three) times daily.  Marland Kitchen omeprazole (PRILOSEC) 20 MG capsule Take 1 capsule (20 mg total) by mouth daily.  Marland Kitchen triamcinolone (KENALOG) 0.025 % cream Apply 1 application topically 2 (two) times daily as needed (skin rash).   No current facility-administered medications on file prior to visit.    Allergies:  Allergies  Allergen Reactions  . Adhesive [Tape] Rash    "whelps", latex adhesive tape only, paper tape is ok     Social History:  Social History   Socioeconomic History  . Marital status: Married    Spouse name: Not on file  . Number of children: Not on file  . Years of education: Not on file  . Highest education level: Not on file  Occupational History  . Occupation: pastor   Tobacco Use  . Smoking status: Never Smoker  . Smokeless tobacco: Never Used  Vaping Use  . Vaping Use: Never used  Substance and Sexual Activity  . Alcohol use: Not Currently    Comment: quit using alcohol upon entering [place]  . Drug use: Never  . Sexual activity: Not Currently    Birth  control/protection: None  Other Topics Concern  . Not on file  Social History Narrative  . Not on file   Social Determinants of Health   Financial Resource Strain: Low Risk   . Difficulty of Paying Living Expenses: Not hard at all  Food Insecurity: No Food Insecurity  . Worried About Charity fundraiser in the Last Year: Never true  . Ran Out of Food in the Last Year: Never true  Transportation Needs: No Transportation Needs  . Lack of Transportation (Medical): No  . Lack of Transportation (Non-Medical): No  Physical Activity: Insufficiently Active  . Days of Exercise per Week: 4 days  . Minutes of Exercise per Session: 30 min  Stress: Not on file  Social Connections: Socially Integrated  . Frequency of Communication with Friends and Family: More than three times a week  . Frequency of Social Gatherings with Friends and Family: More than three times a week  . Attends Religious Services: More than 4 times per year  . Active Member of Clubs or Organizations: Yes  . Attends Archivist Meetings: More than 4 times per year  . Marital Status: Married  Human resources officer Violence: Not on file   Social History   Tobacco Use  Smoking Status Never Smoker  Smokeless Tobacco Never Used   Social History   Substance and Sexual Activity  Alcohol Use Not Currently   Comment: quit using alcohol upon entering ministry 1977    Family History:  Family History  Problem Relation Age of Onset  . Diabetes Mother   . Arthritis Mother   . Obesity Mother   . Parkinson's disease Father   . Hypertension Brother   . Arthritis Brother   . Asthma Brother   . Diabetes Son        type 1  . Heart disease Maternal Grandmother        CHF  . Stroke Maternal Grandfather   . Heart disease Paternal Grandmother        CHF  . Stroke Paternal Grandfather     Past medical history, surgical history, medications, allergies, family history and social history reviewed  with patient today and  changes made to appropriate areas of the chart.   Review of Systems - All other ROS negative except what is listed above and in the HPI.      Objective:    BP 132/82 (BP Location: Left Arm)   Pulse 60 Comment: apical  Temp (!) 97.4 F (36.3 C) (Oral)   Ht 5' 9.29" (1.76 m)   Wt 258 lb 3.2 oz (117.1 kg)   SpO2 100%   BMI 37.81 kg/m   Wt Readings from Last 3 Encounters:  11/24/20 258 lb 3.2 oz (117.1 kg)  10/13/20 258 lb 6.4 oz (117.2 kg)  09/07/20 259 lb (117.5 kg)    Physical Exam Vitals and nursing note reviewed.  Constitutional:      General: He is awake. He is not in acute distress.    Appearance: He is well-developed and well-groomed. He is obese. He is not ill-appearing.  HENT:     Head: Normocephalic and atraumatic.     Right Ear: Hearing, tympanic membrane, ear canal and external ear normal. No drainage.     Left Ear: Hearing, tympanic membrane, ear canal and external ear normal. No drainage.     Nose: Nose normal.     Mouth/Throat:     Pharynx: Uvula midline.  Eyes:     General: Lids are normal.        Right eye: No discharge.        Left eye: No discharge.     Extraocular Movements: Extraocular movements intact.     Conjunctiva/sclera: Conjunctivae normal.     Pupils: Pupils are equal, round, and reactive to light.     Visual Fields: Right eye visual fields normal and left eye visual fields normal.  Neck:     Thyroid: No thyromegaly.     Vascular: No carotid bruit or JVD.     Trachea: Trachea normal.  Cardiovascular:     Rate and Rhythm: Normal rate and regular rhythm.     Heart sounds: Normal heart sounds, S1 normal and S2 normal. No murmur heard. No gallop.   Pulmonary:     Effort: Pulmonary effort is normal. No accessory muscle usage or respiratory distress.     Breath sounds: Normal breath sounds.  Abdominal:     General: Bowel sounds are normal.     Palpations: Abdomen is soft. There is no hepatomegaly or splenomegaly.     Tenderness: There is no  abdominal tenderness.  Musculoskeletal:        General: Normal range of motion.     Cervical back: Normal range of motion and neck supple.     Right lower leg: No edema.     Left lower leg: No edema.  Lymphadenopathy:     Head:     Right side of head: No submental, submandibular, tonsillar, preauricular or posterior auricular adenopathy.     Left side of head: No submental, submandibular, tonsillar, preauricular or posterior auricular adenopathy.     Cervical: No cervical adenopathy.  Skin:    General: Skin is warm and dry.     Capillary Refill: Capillary refill takes less than 2 seconds.     Findings: No rash.  Neurological:     Mental Status: He is alert and oriented to person, place, and time.     Cranial Nerves: Cranial nerves are intact.     Gait: Gait is intact.     Deep Tendon Reflexes: Reflexes are normal and symmetric.  Reflex Scores:      Brachioradialis reflexes are 2+ on the right side and 2+ on the left side.      Patellar reflexes are 2+ on the right side and 2+ on the left side. Psychiatric:        Attention and Perception: Attention normal.        Mood and Affect: Mood normal.        Speech: Speech normal.        Behavior: Behavior normal. Behavior is cooperative.        Thought Content: Thought content normal.        Cognition and Memory: Cognition normal.        Judgment: Judgment normal.    Results for orders placed or performed in visit on 10/13/20  Amylase  Result Value Ref Range   Amylase 116 (H) 31 - 110 U/L  Lipase  Result Value Ref Range   Lipase 29 13 - 78 U/L  Comprehensive metabolic panel  Result Value Ref Range   Glucose 75 65 - 99 mg/dL   BUN 23 8 - 27 mg/dL   Creatinine, Ser 1.04 0.76 - 1.27 mg/dL   GFR calc non Af Amer 72 >59 mL/min/1.73   GFR calc Af Amer 83 >59 mL/min/1.73   BUN/Creatinine Ratio 22 10 - 24   Sodium 143 134 - 144 mmol/L   Potassium 3.8 3.5 - 5.2 mmol/L   Chloride 104 96 - 106 mmol/L   CO2 22 20 - 29 mmol/L    Calcium 9.4 8.6 - 10.2 mg/dL   Total Protein 6.8 6.0 - 8.5 g/dL   Albumin 4.0 3.7 - 4.7 g/dL   Globulin, Total 2.8 1.5 - 4.5 g/dL   Albumin/Globulin Ratio 1.4 1.2 - 2.2   Bilirubin Total 0.3 0.0 - 1.2 mg/dL   Alkaline Phosphatase 102 44 - 121 IU/L   AST 23 0 - 40 IU/L   ALT 17 0 - 44 IU/L  CBC with Differential/Platelet  Result Value Ref Range   WBC 5.2 3.4 - 10.8 x10E3/uL   RBC 4.29 4.14 - 5.80 x10E6/uL   Hemoglobin 13.6 13.0 - 17.7 g/dL   Hematocrit 39.6 37.5 - 51.0 %   MCV 92 79 - 97 fL   MCH 31.7 26.6 - 33.0 pg   MCHC 34.3 31.5 - 35.7 g/dL   RDW 13.1 11.6 - 15.4 %   Platelets 198 150 - 450 x10E3/uL   Neutrophils 58 Not Estab. %   Lymphs 20 Not Estab. %   Monocytes 11 Not Estab. %   Eos 10 Not Estab. %   Basos 1 Not Estab. %   Neutrophils Absolute 3.0 1.4 - 7.0 x10E3/uL   Lymphocytes Absolute 1.1 0.7 - 3.1 x10E3/uL   Monocytes Absolute 0.6 0.1 - 0.9 x10E3/uL   EOS (ABSOLUTE) 0.5 (H) 0.0 - 0.4 x10E3/uL   Basophils Absolute 0.1 0.0 - 0.2 x10E3/uL   Immature Granulocytes 0 Not Estab. %   Immature Grans (Abs) 0.0 0.0 - 0.1 x10E3/uL  Urinalysis, Routine w reflex microscopic  Result Value Ref Range   Specific Gravity, UA 1.025 1.005 - 1.030   pH, UA 6.5 5.0 - 7.5   Color, UA Yellow Yellow   Appearance Ur Clear Clear   Leukocytes,UA Negative Negative   Protein,UA Negative Negative/Trace   Glucose, UA Negative Negative   Ketones, UA Trace (A) Negative   RBC, UA Negative Negative   Bilirubin, UA Negative Negative   Urobilinogen, Ur 0.2 0.2 - 1.0 mg/dL  Nitrite, UA Negative Negative      Assessment & Plan:   Problem List Items Addressed This Visit      Cardiovascular and Mediastinum   Essential hypertension, benign - Primary    Chronic, stable with BP at goal today.  Recommend he monitor BP at least a few mornings a week at home and document.  DASH diet at home.  Continue current medication regimen and adjust as needed.  Labs today.  Refills sent in.  Return in 6  months.       Relevant Medications   amLODipine (NORVASC) 10 MG tablet   lisinopril-hydrochlorothiazide (ZESTORETIC) 20-25 MG tablet   Other Relevant Orders   CBC with Differential/Platelet   TSH     Respiratory   Sleep apnea    Chronic, stable. Continue with CPAP QHS, 100% use        Digestive   GERD (gastroesophageal reflux disease)    Chronic, ongoing with improved control with daily Prilosec.   Continue current medication regimen and adjust as needed.  Recheck CMP, CBC, Amylase, Lipase, today.  Return in 6 months, sooner dependent on labs.  May benefit from GI visit and imaging if worsening or ongoing elevations in labs -- he is agreeable to this.  Review labs tomorrow and determine need for referral.      Relevant Orders   Magnesium     Other   Obesity (BMI 35.0-39.9 without comorbidity)    BMI 37.81.  Recommended eating smaller high protein, low fat meals more frequently and exercising 30 mins a day 5 times a week with a goal of 10-15lb weight loss in the next 3 months. Patient voiced their understanding and motivation to adhere to these recommendations.       History of prostate cancer    Stable.  PSA today. Continue to collaborate with Dr. Bernardo Heater with care and future needs.       Relevant Orders   PSA   History of pulmonary embolism    Recommended continue daily aspirin. Continue to collaborate with Dr. Rockey Situ for care and future needs.       Hiatal hernia    History of with current mesh in place.  Has ongoing GERD and GI discomfort issues, recheck labs today and consider imaging and GI referral if ongoing.      Elevated LDL cholesterol level    Current ASCVD 22.4%, discussed with patient.  Will recheck lipid panel today and consider addition of statin therapy if elevations noted.  Educated patient on statin use and side effects.      Relevant Orders   Lipid Panel w/o Chol/HDL Ratio   Elevated amylase    Recheck today and if ongoing consider imaging and  referral to GI.  Trending down last check with normal Lipase.      Relevant Orders   Comprehensive metabolic panel   Amylase   Lipase   Gamma GT    Other Visit Diagnoses    Annual physical exam       Annual labs today to include CBC, CMP, TSH, lipid, PSA       Discussed aspirin prophylaxis for myocardial infarction prevention and decision was made to continue ASA  LABORATORY TESTING:  Health maintenance labs ordered today as discussed above. PSA on labs today and will alert urology to level.  IMMUNIZATIONS:   - Tdap: Tetanus vaccination status reviewed: Administered today. - Influenza: Up to date - Pneumovax: Up to Date - Prevnar: Up to date - Zostavax vaccine:  Up To Date  SCREENING: - Colonoscopy: Up To Date -- next 2024 Discussed with patient purpose of the colonoscopy is to detect colon cancer at curable precancerous or early stages   - AAA Screening: Not applicable  -Hearing Test: Not applicable  -Spirometry: Not applicable   PATIENT COUNSELING:    Sexuality: Discussed sexually transmitted diseases, partner selection, use of condoms, avoidance of unintended pregnancy  and contraceptive alternatives.   Advised to avoid cigarette smoking.  I discussed with the patient that most people either abstain from alcohol or drink within safe limits (<=14/week and <=4 drinks/occasion for males, <=7/weeks and <= 3 drinks/occasion for females) and that the risk for alcohol disorders and other health effects rises proportionally with the number of drinks per week and how often a drinker exceeds daily limits.  Discussed cessation/primary prevention of drug use and availability of treatment for abuse.   Diet: Encouraged to adjust caloric intake to maintain  or achieve ideal body weight, to reduce intake of dietary saturated fat and total fat, to limit sodium intake by avoiding high sodium foods and not adding table salt, and to maintain adequate dietary potassium and calcium  preferably from fresh fruits, vegetables, and low-fat dairy products.    Stressed the importance of regular exercise  Injury prevention: Discussed safety belts, safety helmets, smoke detector, smoking near bedding or upholstery.   Dental health: Discussed importance of regular tooth brushing, flossing, and dental visits.   Follow up plan: NEXT PREVENTATIVE PHYSICAL DUE IN 1 YEAR. Return in about 6 months (around 05/24/2021) for GERD, HTN/HLD.

## 2020-11-24 NOTE — Assessment & Plan Note (Signed)
History of with current mesh in place.  Has ongoing GERD and GI discomfort issues, recheck labs today and consider imaging and GI referral if ongoing.

## 2020-11-25 LAB — CBC WITH DIFFERENTIAL/PLATELET
Basophils Absolute: 0.1 10*3/uL (ref 0.0–0.2)
Basos: 1 %
EOS (ABSOLUTE): 0.3 10*3/uL (ref 0.0–0.4)
Eos: 6 %
Hematocrit: 39.3 % (ref 37.5–51.0)
Hemoglobin: 13.4 g/dL (ref 13.0–17.7)
Immature Grans (Abs): 0 10*3/uL (ref 0.0–0.1)
Immature Granulocytes: 0 %
Lymphocytes Absolute: 1.1 10*3/uL (ref 0.7–3.1)
Lymphs: 22 %
MCH: 31.2 pg (ref 26.6–33.0)
MCHC: 34.1 g/dL (ref 31.5–35.7)
MCV: 91 fL (ref 79–97)
Monocytes Absolute: 0.6 10*3/uL (ref 0.1–0.9)
Monocytes: 12 %
Neutrophils Absolute: 2.9 10*3/uL (ref 1.4–7.0)
Neutrophils: 59 %
Platelets: 184 10*3/uL (ref 150–450)
RBC: 4.3 x10E6/uL (ref 4.14–5.80)
RDW: 12.7 % (ref 11.6–15.4)
WBC: 5 10*3/uL (ref 3.4–10.8)

## 2020-11-25 LAB — COMPREHENSIVE METABOLIC PANEL
ALT: 67 IU/L — ABNORMAL HIGH (ref 0–44)
AST: 40 IU/L (ref 0–40)
Albumin/Globulin Ratio: 1.5 (ref 1.2–2.2)
Albumin: 4 g/dL (ref 3.7–4.7)
Alkaline Phosphatase: 155 IU/L — ABNORMAL HIGH (ref 44–121)
BUN/Creatinine Ratio: 19 (ref 10–24)
BUN: 16 mg/dL (ref 8–27)
Bilirubin Total: 0.7 mg/dL (ref 0.0–1.2)
CO2: 24 mmol/L (ref 20–29)
Calcium: 9.3 mg/dL (ref 8.6–10.2)
Chloride: 104 mmol/L (ref 96–106)
Creatinine, Ser: 0.85 mg/dL (ref 0.76–1.27)
GFR calc Af Amer: 101 mL/min/{1.73_m2} (ref >59)
GFR calc non Af Amer: 88 mL/min/{1.73_m2} (ref >59)
Globulin, Total: 2.6 g/dL (ref 1.5–4.5)
Glucose: 79 mg/dL (ref 65–99)
Potassium: 3.5 mmol/L (ref 3.5–5.2)
Sodium: 144 mmol/L (ref 134–144)
Total Protein: 6.6 g/dL (ref 6.0–8.5)

## 2020-11-25 LAB — LIPASE: Lipase: 21 U/L (ref 13–78)

## 2020-11-25 LAB — LIPID PANEL W/O CHOL/HDL RATIO
Cholesterol, Total: 183 mg/dL (ref 100–199)
HDL: 53 mg/dL (ref >39)
LDL Chol Calc (NIH): 118 mg/dL — ABNORMAL HIGH (ref 0–99)
Triglycerides: 65 mg/dL (ref 0–149)
VLDL Cholesterol Cal: 12 mg/dL (ref 5–40)

## 2020-11-25 LAB — MAGNESIUM: Magnesium: 1.7 mg/dL (ref 1.6–2.3)

## 2020-11-25 LAB — TSH: TSH: 1.42 u[IU]/mL (ref 0.450–4.500)

## 2020-11-25 LAB — PSA: Prostate Specific Ag, Serum: <0.1 ng/mL (ref 0.0–4.0)

## 2020-11-25 LAB — AMYLASE: Amylase: 101 U/L (ref 31–110)

## 2020-11-25 LAB — GAMMA GT: GGT: 189 IU/L — ABNORMAL HIGH (ref 0–65)

## 2020-11-25 NOTE — Progress Notes (Signed)
Contacted via MyChart The 10-year ASCVD risk score Mikey Bussing DC Jr., et al., 2013) is: 22%   Values used to calculate the score:     Age: 70 years     Sex: Male     Is Non-Hispanic African American: No     Diabetic: No     Tobacco smoker: No     Systolic Blood Pressure: 852 mmHg     Is BP treated: Yes     HDL Cholesterol: 53 mg/dL     Total Cholesterol: 183 mg/dL   Good evening Lanny Hurst, your labs have returned.  CBC is normal, no anemia.  Thyroid normal.  Prostate normal. - Cholesterol levels show LDL above goal at 118, goal for stroke prevention is less then 70 and your current risk score is 22% when I calculate this.  I would recommend a trial of a low dose statin in future to help lower risk. - Your Amylase and Lipase are normal now, but your GGT and Alkaline Phosphatase are elevated, as is your ALT.  Do you still have your gall bladder? I question whether this is what is underlying cause right now of your issues if so. If you still have gall bladder I would like to get some imaging via ultrasound of your right upper quadrant to look at this.  Please let me know.  Any questions? Keep being awesome!!  Thank you for allowing me to participate in your care. Kindest regards, Alithea Lapage

## 2020-11-29 ENCOUNTER — Other Ambulatory Visit: Payer: Self-pay | Admitting: Nurse Practitioner

## 2020-11-29 MED ORDER — ROSUVASTATIN CALCIUM 10 MG PO TABS: 10.0000 mg | ORAL_TABLET | Freq: Every day | ORAL | 3 refills | Status: DC

## 2020-12-06 ENCOUNTER — Other Ambulatory Visit: Payer: Self-pay | Admitting: Nurse Practitioner

## 2020-12-06 ENCOUNTER — Ambulatory Visit: Payer: Self-pay | Admitting: Urology

## 2020-12-06 DIAGNOSIS — R748 Abnormal levels of other serum enzymes: Secondary | ICD-10-CM

## 2020-12-06 DIAGNOSIS — K429 Umbilical hernia without obstruction or gangrene: Secondary | ICD-10-CM

## 2020-12-06 DIAGNOSIS — Z8546 Personal history of malignant neoplasm of prostate: Secondary | ICD-10-CM

## 2020-12-23 ENCOUNTER — Other Ambulatory Visit: Payer: Self-pay | Admitting: Nurse Practitioner

## 2020-12-28 ENCOUNTER — Other Ambulatory Visit: Payer: Self-pay | Admitting: Nurse Practitioner

## 2021-01-06 ENCOUNTER — Other Ambulatory Visit: Payer: Self-pay

## 2021-01-06 ENCOUNTER — Ambulatory Visit: Payer: Medicare PPO | Admitting: Nurse Practitioner

## 2021-01-06 ENCOUNTER — Encounter: Payer: Self-pay | Admitting: Nurse Practitioner

## 2021-01-06 VITALS — BP 124/78 | HR 59 | Temp 97.7°F | Wt 261.6 lb

## 2021-01-06 DIAGNOSIS — R748 Abnormal levels of other serum enzymes: Secondary | ICD-10-CM | POA: Diagnosis not present

## 2021-01-06 DIAGNOSIS — E782 Mixed hyperlipidemia: Secondary | ICD-10-CM | POA: Diagnosis not present

## 2021-01-06 DIAGNOSIS — E669 Obesity, unspecified: Secondary | ICD-10-CM | POA: Diagnosis not present

## 2021-01-06 NOTE — Progress Notes (Signed)
BP 124/78   Pulse (!) 59   Temp 97.7 F (36.5 C) (Oral)   Wt 261 lb 9.6 oz (118.7 kg)   SpO2 98%   BMI 38.31 kg/m    Subjective:    Patient ID: Grant Diaz, male    DOB: 06-Jan-1951, 70 y.o.   MRN: [id number]  HPI: Grant Diaz is a 70 y.o. male  Chief Complaint  Patient presents with  . Medication Problem    Patient states when he started taking the cholesterol medication he has noticed some shoulder and arm pain. Patient states he would like to discuss GI issues with provider   GERD Had history of pancreatitis in 2021.  Has had EGD in past with hiatal hernia. Does have known hiatal hernia -- had history of lap band and mesh placed with hernia.  Taking Prilosec every day which is helping.  Recent labs showed improved Amylase of 101, but improved Lipase (21) -- GGT was mildly elevated at 189 (no gall bladder) and AST/ALT 40/67.  In 2010 CT showed possible pancreatic mass, but MRI showed no mass.   In past 6 weeks has had improved GI symptoms.  Sees GI on April 7th.   GERD control status: controlled  Satisfied with current treatment? yes Heartburn frequency: very little Medication side effects: no  Medication compliance: stable Previous GERD medications: Antacid use frequency: "occasionally" in the evening after a larger meal   Aggravating factors: Larger meals, greasy food Dysphagia: no - pt reports a lap band and food will "back up" with larger meals Odynophagia:  no Hematemesis: no Blood in stool: no EGD: yes - appx 4 years ago  HYPERLIPIDEMIA Started on Rosuvastatin 10 MG recently for elevation in LDL. Hyperlipidemia status: good compliance Satisfied with current treatment?  yes Side effects:  occasional muscle aches Medication compliance: good compliance Supplements: none Aspirin:  yes The 10-year ASCVD risk score Mikey Bussing DC Jr., et al., 2013) is: 19.9%   Values used to calculate the score:     Age: 80 years     Sex: Male     Is Non-Hispanic African  American: No     Diabetic: No     Tobacco smoker: No     Systolic Blood Pressure: 409 mmHg     Is BP treated: Yes     HDL Cholesterol: 53 mg/dL     Total Cholesterol: 183 mg/dL Chest pain:  no Coronary artery disease:  no Family history CAD:  yes -- father and grandfather Family history early CAD:  no  Relevant past medical, surgical, family and social history reviewed and updated as indicated. Interim medical history since our last visit reviewed. Allergies and medications reviewed and updated.  Review of Systems  Constitutional: Negative for activity change, diaphoresis, fatigue and fever.  Respiratory: Negative for cough, chest tightness, shortness of breath and wheezing.   Cardiovascular: Negative for chest pain, palpitations and leg swelling.  Gastrointestinal: Negative.   Neurological: Negative.   Psychiatric/Behavioral: Negative.     Per HPI unless specifically indicated above     Objective:    BP 124/78   Pulse (!) 59   Temp 97.7 F (36.5 C) (Oral)   Wt 261 lb 9.6 oz (118.7 kg)   SpO2 98%   BMI 38.31 kg/m   Wt Readings from Last 3 Encounters:  01/06/21 261 lb 9.6 oz (118.7 kg)  11/24/20 258 lb 3.2 oz (117.1 kg)  10/13/20 258 lb 6.4 oz (117.2 kg)    Physical Exam  Vitals and nursing note reviewed.  Constitutional:      General: He is awake. He is not in acute distress.    Appearance: He is well-developed. He is obese. He is not ill-appearing.  HENT:     Head: Normocephalic and atraumatic.     Right Ear: Hearing normal. No drainage.     Left Ear: Hearing normal. No drainage.  Eyes:     General: Lids are normal.        Right eye: No discharge.        Left eye: No discharge.     Conjunctiva/sclera: Conjunctivae normal.     Pupils: Pupils are equal, round, and reactive to light.  Neck:     Vascular: No carotid bruit.  Cardiovascular:     Rate and Rhythm: Normal rate and regular rhythm.     Heart sounds: Normal heart sounds, S1 normal and S2 normal. No  murmur heard. No gallop.   Pulmonary:     Effort: Pulmonary effort is normal. No accessory muscle usage or respiratory distress.     Breath sounds: Normal breath sounds.  Abdominal:     General: Bowel sounds are normal. There is no distension.     Palpations: Abdomen is soft.     Tenderness: There is no abdominal tenderness. There is no right CVA tenderness or left CVA tenderness.     Hernia: No hernia is present.  Musculoskeletal:        General: Normal range of motion.     Cervical back: Normal range of motion and neck supple.     Right lower leg: No edema.     Left lower leg: No edema.  Skin:    General: Skin is warm and dry.  Neurological:     Mental Status: He is alert and oriented to person, place, and time.  Psychiatric:        Attention and Perception: Attention normal.        Mood and Affect: Mood normal.        Speech: Speech normal.        Behavior: Behavior normal. Behavior is cooperative.        Thought Content: Thought content normal.    Results for orders placed or performed in visit on 11/24/20  CBC with Differential/Platelet  Result Value Ref Range   WBC 5.0 3.4 - 10.8 x10E3/uL   RBC 4.30 4.14 - 5.80 x10E6/uL   Hemoglobin 13.4 13.0 - 17.7 g/dL   Hematocrit 39.3 37.5 - 51.0 %   MCV 91 79 - 97 fL   MCH 31.2 26.6 - 33.0 pg   MCHC 34.1 31.5 - 35.7 g/dL   RDW 12.7 11.6 - 15.4 %   Platelets 184 150 - 450 x10E3/uL   Neutrophils 59 Not Estab. %   Lymphs 22 Not Estab. %   Monocytes 12 Not Estab. %   Eos 6 Not Estab. %   Basos 1 Not Estab. %   Neutrophils Absolute 2.9 1.4 - 7.0 x10E3/uL   Lymphocytes Absolute 1.1 0.7 - 3.1 x10E3/uL   Monocytes Absolute 0.6 0.1 - 0.9 x10E3/uL   EOS (ABSOLUTE) 0.3 0.0 - 0.4 x10E3/uL   Basophils Absolute 0.1 0.0 - 0.2 x10E3/uL   Immature Granulocytes 0 Not Estab. %   Immature Grans (Abs) 0.0 0.0 - 0.1 x10E3/uL  Comprehensive metabolic panel  Result Value Ref Range   Glucose 79 65 - 99 mg/dL   BUN 16 8 - 27 mg/dL    Creatinine, Ser 0.85  0.76 - 1.27 mg/dL   GFR calc non Af Amer 88 >59 mL/min/1.73   GFR calc Af Amer 101 >59 mL/min/1.73   BUN/Creatinine Ratio 19 10 - 24   Sodium 144 134 - 144 mmol/L   Potassium 3.5 3.5 - 5.2 mmol/L   Chloride 104 96 - 106 mmol/L   CO2 24 20 - 29 mmol/L   Calcium 9.3 8.6 - 10.2 mg/dL   Total Protein 6.6 6.0 - 8.5 g/dL   Albumin 4.0 3.7 - 4.7 g/dL   Globulin, Total 2.6 1.5 - 4.5 g/dL   Albumin/Globulin Ratio 1.5 1.2 - 2.2   Bilirubin Total 0.7 0.0 - 1.2 mg/dL   Alkaline Phosphatase 155 (H) 44 - 121 IU/L   AST 40 0 - 40 IU/L   ALT 67 (H) 0 - 44 IU/L  Lipid Panel w/o Chol/HDL Ratio  Result Value Ref Range   Cholesterol, Total 183 100 - 199 mg/dL   Triglycerides 65 0 - 149 mg/dL   HDL 53 >39 mg/dL   VLDL Cholesterol Cal 12 5 - 40 mg/dL   LDL Chol Calc (NIH) 118 (H) 0 - 99 mg/dL  TSH  Result Value Ref Range   TSH 1.420 0.450 - 4.500 uIU/mL  PSA  Result Value Ref Range   Prostate Specific Ag, Serum <0.1 0.0 - 4.0 ng/mL  Amylase  Result Value Ref Range   Amylase 101 31 - 110 U/L  Lipase  Result Value Ref Range   Lipase 21 13 - 78 U/L  Gamma GT  Result Value Ref Range   GGT 189 (H) 0 - 65 IU/L  Magnesium  Result Value Ref Range   Magnesium 1.7 1.6 - 2.3 mg/dL      Assessment & Plan:   Problem List Items Addressed This Visit      Other   Obesity (BMI 35.0-39.9 without comorbidity)    BMI 38.31.  Recommended eating smaller high protein, low fat meals more frequently and exercising 30 mins a day 5 times a week with a goal of 10-15lb weight loss in the next 3 months. Patient voiced their understanding and motivation to adhere to these recommendations.       Mixed hyperlipidemia    Chronic, ongoing.  Current ASCVD 19.9%, discussed with patient.  Will recheck lipid panel today and continue statin at this time.  Educated patient on statin use and side effects.      Relevant Orders   Lipid Panel w/o Chol/HDL Ratio   Elevated amylase - Primary    Recheck  today, stable last check.  Trending down last check with normal Lipase and Amylase. Continue collaborate with GI.      Relevant Orders   Comprehensive metabolic panel   Gamma GT   Amylase   Lipase       Follow up plan: Return in about 6 months (around 07/09/2021) for HTN/HLD, OSA, GERD.

## 2021-01-06 NOTE — Assessment & Plan Note (Signed)
Chronic, ongoing.  Current ASCVD 19.9%, discussed with patient.  Will recheck lipid panel today and continue statin at this time.  Educated patient on statin use and side effects.

## 2021-01-06 NOTE — Patient Instructions (Signed)
Preventing High Cholesterol Cholesterol is a white, waxy substance similar to fat that the human body needs to help build cells. The liver makes all the cholesterol that a person's body needs. Having high cholesterol (hypercholesterolemia) increases your risk for heart disease and stroke. Extra or excess cholesterol comes from the food that you eat. High cholesterol can often be prevented with diet and lifestyle changes. If you already have high cholesterol, you can control it with diet, lifestyle changes, and medicines. How can high cholesterol affect me? If you have high cholesterol, fatty deposits (plaques) may build up on the walls of your blood vessels. The blood vessels that carry blood away from your heart are called arteries. Plaques make the arteries narrower and stiffer. This in turn can:  Restrict or block blood flow and cause blood clots to form.  Increase your risk for heart attack and stroke. What can increase my risk for high cholesterol? This condition is more likely to develop in people who:  Eat foods that are high in saturated fat or cholesterol. Saturated fat is mostly found in foods that come from animal sources.  Are overweight.  Are not getting enough exercise.  Have a family history of high cholesterol (familial hypercholesterolemia). What actions can I take to prevent this? Nutrition  Eat less saturated fat.  Avoid trans fats (partially hydrogenated oils). These are often found in margarine and in some baked goods, fried foods, and snacks bought in packages.  Avoid precooked or cured meat, such as bacon, sausages, or meat loaves.  Avoid foods and drinks that have added sugars.  Eat more fruits, vegetables, and whole grains.  Choose healthy sources of protein, such as fish, poultry, lean cuts of red meat, beans, peas, lentils, and nuts.  Choose healthy sources of fat, such as: ? Nuts. ? Vegetable oils, especially olive oil. ? Fish that have healthy fats,  such as omega-3 fatty acids. These fish include mackerel or salmon.   Lifestyle  Lose weight if you are overweight. Maintaining a healthy body mass index (BMI) can help prevent or control high cholesterol. It can also lower your risk for diabetes and high blood pressure. Ask your health care provider to help you with a diet and exercise plan to lose weight safely.  Do not use any products that contain nicotine or tobacco, such as cigarettes, e-cigarettes, and chewing tobacco. If you need help quitting, ask your health care provider. Alcohol use  Do not drink alcohol if: ? Your health care provider tells you not to drink. ? You are pregnant, may be pregnant, or are planning to become pregnant.  If you drink alcohol: ? Limit how much you use to:  0-1 drink a day for women.  0-2 drinks a day for men. ? Be aware of how much alcohol is in your drink. In the [place] one drink equals one 12 oz bottle of beer (355 mL), one 5 oz glass of wine (148 mL), or one 1 oz glass of hard liquor (44 mL). Activity  Get enough exercise. Do exercises as told by your health care provider.  Each week, do at least 150 minutes of exercise that takes a medium level of effort (moderate-intensity exercise). This kind of exercise: ? Makes your heart beat faster while allowing you to still be able to talk. ? Can be done in short sessions several times a day or longer sessions a few times a week. For example, on 5 days each week, you could walk fast or ride   your bike 3 times a day for 10 minutes each time.   Medicines  Your health care provider may recommend medicines to help lower cholesterol. This may be a medicine to lower the amount of cholesterol that your liver makes. You may need medicine if: ? Diet and lifestyle changes have not lowered your cholesterol enough. ? You have high cholesterol and other risk factors for heart disease or stroke.  Take over-the-counter and prescription medicines only as told by your  health care provider. General information  Manage your risk factors for high cholesterol. Talk with your health care provider about all your risk factors and how to lower your risk.  Manage other conditions that you have, such as diabetes or high blood pressure (hypertension).  Have blood tests to check your cholesterol levels at regular points in time as told by your health care provider.  Keep all follow-up visits as told by your health care provider. This is important. Where to find more information  American Heart Association: www.heart.org  National Heart, Lung, and Blood Institute: www.nhlbi.nih.gov Summary  High cholesterol increases your risk for heart disease and stroke. By keeping your cholesterol level low, you can reduce your risk for these conditions.  High cholesterol can often be prevented with diet and lifestyle changes.  Work with your health care provider to manage your risk factors, and have your blood tested regularly. This information is not intended to replace advice given to you by your health care provider. Make sure you discuss any questions you have with your health care provider. Document Revised: 07/22/2019 Document Reviewed: 07/22/2019 Elsevier Patient Education  2021 Elsevier Inc.  

## 2021-01-06 NOTE — Assessment & Plan Note (Signed)
Recheck today, stable last check.  Trending down last check with normal Lipase and Amylase. Continue collaborate with GI.

## 2021-01-06 NOTE — Assessment & Plan Note (Signed)
BMI 38.31.  Recommended eating smaller high protein, low fat meals more frequently and exercising 30 mins a day 5 times a week with a goal of 10-15lb weight loss in the next 3 months. Patient voiced their understanding and motivation to adhere to these recommendations.

## 2021-01-07 LAB — LIPID PANEL W/O CHOL/HDL RATIO
Cholesterol, Total: 126 mg/dL (ref 100–199)
HDL: 47 mg/dL (ref >39)
LDL Chol Calc (NIH): 66 mg/dL (ref 0–99)
Triglycerides: 63 mg/dL (ref 0–149)
VLDL Cholesterol Cal: 13 mg/dL (ref 5–40)

## 2021-01-07 LAB — COMPREHENSIVE METABOLIC PANEL
ALT: 18 IU/L (ref 0–44)
AST: 22 IU/L (ref 0–40)
Albumin/Globulin Ratio: 1.6 (ref 1.2–2.2)
Albumin: 4.1 g/dL (ref 3.7–4.7)
Alkaline Phosphatase: 101 IU/L (ref 44–121)
BUN/Creatinine Ratio: 20 (ref 10–24)
BUN: 21 mg/dL (ref 8–27)
Bilirubin Total: 0.5 mg/dL (ref 0.0–1.2)
CO2: 23 mmol/L (ref 20–29)
Calcium: 9.5 mg/dL (ref 8.6–10.2)
Chloride: 104 mmol/L (ref 96–106)
Creatinine, Ser: 1.03 mg/dL (ref 0.76–1.27)
Globulin, Total: 2.6 g/dL (ref 1.5–4.5)
Glucose: 94 mg/dL (ref 65–99)
Potassium: 4 mmol/L (ref 3.5–5.2)
Sodium: 141 mmol/L (ref 134–144)
Total Protein: 6.7 g/dL (ref 6.0–8.5)
eGFR: 78 mL/min/{1.73_m2} (ref >59)

## 2021-01-07 LAB — AMYLASE: Amylase: 118 U/L — ABNORMAL HIGH (ref 31–110)

## 2021-01-07 LAB — LIPASE: Lipase: 26 U/L (ref 13–78)

## 2021-01-07 LAB — GAMMA GT: GGT: 69 IU/L — ABNORMAL HIGH (ref 0–65)

## 2021-01-07 NOTE — Progress Notes (Signed)
Contacted via MyChart

## 2021-01-27 ENCOUNTER — Encounter: Payer: Self-pay | Admitting: Gastroenterology

## 2021-01-27 ENCOUNTER — Ambulatory Visit: Payer: Medicare PPO | Admitting: Gastroenterology

## 2021-01-27 ENCOUNTER — Other Ambulatory Visit: Payer: Self-pay

## 2021-01-27 VITALS — BP 157/75 | HR 69 | Temp 97.9°F | Ht 69.29 in | Wt 264.8 lb

## 2021-01-27 DIAGNOSIS — R1319 Other dysphagia: Secondary | ICD-10-CM

## 2021-01-27 DIAGNOSIS — K219 Gastro-esophageal reflux disease without esophagitis: Secondary | ICD-10-CM | POA: Diagnosis not present

## 2021-01-27 NOTE — Progress Notes (Signed)
Grant Diaz [address]  [place], [place] [address]  Main: [phone]  Fax: [phone]   Gastroenterology Consultation  Referring Provider:     Venita Lick, NP Primary Care Physician:  Venita Lick, NP Reason for Consultation:             HPI:    Chief complaint: Epigastric discomfort  Grant Diaz is a 70 y.o. y/o male referred for consultation & management  by Dr. Venita Lick, NP.  Patient reports having epigastric to lower chest discomfort that feels like gas.  He states when the referral was placed, 3 of these episodes occurred close to each other.  He uses Gas-X which relieves the symptoms.  However, he also reports dysphagia and he states this has been going on since he underwent laP band surgery about 20 years ago.  Symptoms occur about once or twice a week and he has to stop what he is eating, and induce emesis.  No recent upper endoscopy.  He does remember having an upper endoscopy years ago for evaluation of a pancreatic lesion that he states was seen on a CT scan years ago, but then resolved on subsequent CT scans since then.  I personally reviewed his previous records.  Patient had a CT scan in 2010 that reported biliary distention is possible pancreatic mass.  Follow-up MRI stated that a definite pancreatic head mass is not identified and ERCP was suggested.  ERCP by Dr. Candace Cruise in 2011    In 2014 patient had an upper endoscopy for evaluation of symptoms of reflux esophagitis.  Operative note states that patient had a lap band by Dr. Excell Seltzer in 2009.  EGD reports a normal esophagus "may be slightly dilated distally, but this is a soft finding."  GE junction at 35 cm.  Opening of LAP-BAND at 38 cm, normal pouch for LAP-BAND, with LAP-BAND "position.  Stomach and duodenum normal.  Recommendations were to follow-up with Dr. Excell Seltzer and since removal of 1 cc of fluid, most of patient's symptoms have resolved as per that note.  Past  Medical History:  Diagnosis Date  . Arthritis   . Cancer [facility]) 2008   Prostate-radiation  . Cataract 2019   not yet significant enough to warrant surgery  . Chronic kidney disease    kidney stones  remote history  . GERD (gastroesophageal reflux disease)   . History of DVT (deep vein thrombosis)   . Hypertension   . Joint pain   . Pancreatitis 2013   [facility]  . Prostate cancer ([place])   . Pulmonary embolus [facility]) March 2016   Bilateral pulmonary embolus, [facility]  . Sleep apnea 2010   use CPAP regularly  . Sleep apnea, obstructive    cpap    Past Surgical History:  Procedure Laterality Date  . CARPAL TUNNEL RELEASE Right 03/15/2017   Procedure: CARPAL TUNNEL RELEASE;  Surgeon: Hessie Knows, MD;  Location: [facility] ORS;  Service: Orthopedics;  Laterality: Right;  . CHOLECYSTECTOMY    . COLONOSCOPY WITH PROPOFOL N/A 04/02/2018   Procedure: COLONOSCOPY WITH PROPOFOL;  Surgeon: Lucilla Lame, MD;  Location: [facility] ENDOSCOPY;  Service: Endoscopy;  Laterality: N/A;  . ESOPHAGOGASTRODUODENOSCOPY N/A 05/28/2013   Procedure: ESOPHAGOGASTRODUODENOSCOPY (EGD);  Surgeon: Shann Medal, MD;  Location: [facility] ENDOSCOPY;  Service: General;  Laterality: N/A;  . GALLBLADDER SURGERY  2003  . GANGLION CYST EXCISION Right 03/15/2017   Procedure: REMOVAL GANGLION OF WRIST;  Surgeon: Hessie Knows, MD;  Location:  [facility] ORS;  Service: Orthopedics;  Laterality: Right;  . HERNIA REPAIR    . hose  2011  . JOINT REPLACEMENT    . KNEE ARTHROSCOPY Bilateral [id number]  . LAPAROSCOPIC GASTRIC BANDING  2009   and hernia repair  . RADIATION IMPLANT W/ ULTRASOUND, MALE  2005   internal/external radiation for prostate cancer  . TOTAL KNEE ARTHROPLASTY Left 10/12/2015   Procedure: TOTAL KNEE ARTHROPLASTY;  Surgeon: Hessie Knows, MD;  Location: [facility] ORS;  Service: Orthopedics;  Laterality: Left;  . TOTAL KNEE ARTHROPLASTY Right 05/15/2017   Procedure: TOTAL KNEE ARTHROPLASTY;  Surgeon: Hessie Knows, MD;  Location: [facility] ORS;   Service: Orthopedics;  Laterality: Right;  . UMBILICAL HERNIA REPAIR  2010    Prior to Admission medications   Medication Sig Start Date End Date Taking? Authorizing Provider  amLODipine (NORVASC) 10 MG tablet Take 1 tablet (10 mg total) by mouth daily. 11/24/20  Yes Cannady, Henrine Screws T, NP  aspirin EC 81 MG tablet Take 81 mg by mouth daily.   Yes [provider]  celecoxib (CELEBREX) 200 MG capsule Take 1 capsule (200 mg total) by mouth daily. 10/27/20  Yes Cannady, Henrine Screws T, NP  Cholecalciferol 25 MCG (1000 UT) capsule Take 1,000 Units by mouth daily.   Yes [provider]  ketoconazole (NIZORAL) 2 % cream Apply 1 application topically daily as needed. 11/10/20  Yes Cannady, Henrine Screws T, NP  lisinopril-hydrochlorothiazide (ZESTORETIC) 20-25 MG tablet Take 1 tablet by mouth once daily 12/23/20  Yes Cannady, Jolene T, NP  Multiple Vitamin (MULTIVITAMIN) capsule Take 1 capsule by mouth daily.   Yes [provider]  neomycin-polymyxin-hydrocortisone (CORTISPORIN) OTIC solution Place 3 drops into both ears 3 (three) times daily. 10/13/20  Yes Cannady, Jolene T, NP  omeprazole (PRILOSEC) 20 MG capsule Take 1 capsule (20 mg total) by mouth daily. 09/07/20  Yes Cannady, Jolene T, NP  rosuvastatin (CRESTOR) 10 MG tablet Take 1 tablet (10 mg total) by mouth daily. 11/29/20  Yes Cannady, Jolene T, NP  triamcinolone (KENALOG) 0.025 % cream Apply 1 application topically 2 (two) times daily as needed (skin rash). 11/10/20  Yes Cannady, Jolene T, NP  acetaminophen (TYLENOL) 325 MG tablet Take 650 mg by mouth every 6 (six) hours as needed. Patient not taking: Reported on 01/27/2021    [provider]    Family History  Problem Relation Age of Onset  . Diabetes Mother   . Arthritis Mother   . Obesity Mother   . Parkinson's disease Father   . Hypertension Brother   . Arthritis Brother   . Asthma Brother   . Diabetes Son        type 1  . Heart disease Maternal Grandmother         CHF  . Stroke Maternal Grandfather   . Heart disease Paternal Grandmother        CHF  . Stroke Paternal Grandfather      Social History   Tobacco Use  . Smoking status: Never Smoker  . Smokeless tobacco: Never Used  Vaping Use  . Vaping Use: Never used  Substance Use Topics  . Alcohol use: Not Currently    Comment: quit using alcohol upon entering [place]  . Drug use: Never    Allergies as of 01/27/2021 - Review Complete 01/27/2021  Allergen Reaction Noted  . Adhesive [tape] Rash 09/29/2015    Review of Systems:    All systems reviewed and negative except where noted in HPI.   Physical  Exam:  BP (!) 157/75   Pulse 69   Temp 97.9 F (36.6 C) (Oral)   Ht 5' 9.29" (1.76 m)   Wt 264 lb 12.8 oz (120.1 kg)   BMI 38.78 kg/m  No LMP for male patient. Psych:  Alert and cooperative. Normal mood and affect. General:   Alert,  Well-developed, well-nourished, pleasant and cooperative in NAD Head:  Normocephalic and atraumatic. Eyes:  Sclera clear, no icterus.   Conjunctiva pink. Ears:  Normal auditory acuity. Nose:  No deformity, discharge, or lesions. Mouth:  No deformity or lesions,oropharynx pink & moist. Neck:  Supple; no masses or thyromegaly. Abdomen:  Normal bowel sounds.  No bruits.  Soft, non-tender and non-distended without masses, hepatosplenomegaly or hernias noted.  No guarding or rebound tenderness.    Msk:  Symmetrical without gross deformities. Good, equal movement & strength bilaterally. Pulses:  Normal pulses noted. Extremities:  No clubbing or edema.  No cyanosis. Neurologic:  Alert and oriented x3;  grossly normal neurologically. Skin:  Intact without significant lesions or rashes. No jaundice. Lymph Nodes:  No significant cervical adenopathy. Psych:  Alert and cooperative. Normal mood and affect.   Labs: CBC    Component Value Date/Time   WBC 5.0 11/24/2020 0947   WBC 9.5 05/17/2017 0405   RBC 4.30 11/24/2020 0947   RBC 3.67 (L) 05/17/2017  0405   HGB 13.4 11/24/2020 0947   HCT 39.3 11/24/2020 0947   PLT 184 11/24/2020 0947   MCV 91 11/24/2020 0947   MCH 31.2 11/24/2020 0947   MCH 31.3 05/17/2017 0405   MCHC 34.1 11/24/2020 0947   MCHC 34.2 05/17/2017 0405   RDW 12.7 11/24/2020 0947   LYMPHSABS 1.1 11/24/2020 0947   MONOABS 0.4 10/26/2008 0810   EOSABS 0.3 11/24/2020 0947   BASOSABS 0.1 11/24/2020 0947   CMP     Component Value Date/Time   NA 141 01/06/2021 0929   K 4.0 01/06/2021 0929   CL 104 01/06/2021 0929   CO2 23 01/06/2021 0929   GLUCOSE 94 01/06/2021 0929   GLUCOSE 93 05/17/2017 0405   BUN 21 01/06/2021 0929   CREATININE 1.03 01/06/2021 0929   CALCIUM 9.5 01/06/2021 0929   PROT 6.7 01/06/2021 0929   ALBUMIN 4.1 01/06/2021 0929   AST 22 01/06/2021 0929   ALT 18 01/06/2021 0929   ALKPHOS 101 01/06/2021 0929   BILITOT 0.5 01/06/2021 0929   GFRNONAA 88 11/24/2020 0947   GFRAA 101 11/24/2020 0947    Imaging Studies: No results found.  Assessment and Plan:   Grant Diaz is a 70 y.o. y/o male has been referred for elevated amylase  Patient does not have any clinical signs of pancreatitis.  Elevated amylase by itself is not specific for pancreatitis.  In September 2021 his pancreas was normal on CT scan.  His liver enzymes are normal.  His lipase is.  His main complaint is epigastric pain, and gastric sensation in his lower chest, that gets better with Gas-X, and also dysphagia.  His dysphagia is pretty significant and given that he is having to induce emesis twice a week.  Would recommend endoscopy for dysphagia, which would also allow evaluation of his LAP-BAND and patient states he has also had a hiatal hernia surgery before  Patient is already on Prilosec and despite this continues to have above symptoms, making that much more important to proceed with upper endoscopy  Alternative options of conservative management were discussed in detail, including but not limited to medication  management,  foregoing endoscopic procedures at this time and others.    I have discussed alternative options, risks & benefits,  which include, but are not limited to, bleeding, infection, perforation,respiratory complication & drug reaction.  The patient agrees with this plan & written consent will be obtained.    Colonoscopy up to date, 2019 with Dr. Allen Norris   Dr Grant Diaz  Speech recognition software was used to dictate the above note.

## 2021-02-07 ENCOUNTER — Encounter: Payer: Self-pay | Admitting: Gastroenterology

## 2021-02-07 ENCOUNTER — Telehealth: Payer: Self-pay | Admitting: Gastroenterology

## 2021-02-07 ENCOUNTER — Telehealth: Payer: Self-pay

## 2021-02-07 NOTE — Telephone Encounter (Signed)
Error

## 2021-02-07 NOTE — Telephone Encounter (Signed)
Patient has questions concerning his upcoming procedure. Would like a call from the provider or CMA

## 2021-02-07 NOTE — Telephone Encounter (Addendum)
Patient has questions about his upcoming procedure/ Would like a call from the provider or CMA.

## 2021-02-07 NOTE — Telephone Encounter (Signed)
Called patient and he wanted to know if he could drink black coffee before his procedure but he was able to read that he was not allowed to do so. Patient also wanted the physician know that he had low grade fever, abdominal pain and is concerned if he had pancreatitis. Patient stated that he will go forward with an EGD and hopefully find out if it was pancreatitis.

## 2021-02-08 ENCOUNTER — Encounter
Admission: RE | Disposition: A | Discharge status: Home / Self Care | Payer: Self-pay | Source: Home / Self Care | Attending: Gastroenterology

## 2021-02-08 ENCOUNTER — Ambulatory Visit: Payer: Medicare PPO | Admitting: Registered Nurse

## 2021-02-08 ENCOUNTER — Ambulatory Visit
Admission: RE | Admit: 2021-02-08 | Discharge: 2021-02-08 | Disposition: A | Discharge status: Home / Self Care | Payer: Medicare PPO | Attending: Gastroenterology | Admitting: Gastroenterology

## 2021-02-08 ENCOUNTER — Encounter: Payer: Self-pay | Admitting: Gastroenterology

## 2021-02-08 DIAGNOSIS — K219 Gastro-esophageal reflux disease without esophagitis: Secondary | ICD-10-CM | POA: Diagnosis not present

## 2021-02-08 DIAGNOSIS — Z923 Personal history of irradiation: Secondary | ICD-10-CM | POA: Diagnosis not present

## 2021-02-08 DIAGNOSIS — K2950 Unspecified chronic gastritis without bleeding: Secondary | ICD-10-CM | POA: Insufficient documentation

## 2021-02-08 DIAGNOSIS — R1319 Other dysphagia: Secondary | ICD-10-CM

## 2021-02-08 DIAGNOSIS — R1310 Dysphagia, unspecified: Secondary | ICD-10-CM | POA: Diagnosis present

## 2021-02-08 DIAGNOSIS — K2270 Barrett's esophagus without dysplasia: Secondary | ICD-10-CM | POA: Insufficient documentation

## 2021-02-08 DIAGNOSIS — Z8546 Personal history of malignant neoplasm of prostate: Secondary | ICD-10-CM | POA: Insufficient documentation

## 2021-02-08 DIAGNOSIS — Z86718 Personal history of other venous thrombosis and embolism: Secondary | ICD-10-CM | POA: Insufficient documentation

## 2021-02-08 DIAGNOSIS — R109 Unspecified abdominal pain: Secondary | ICD-10-CM | POA: Diagnosis not present

## 2021-02-08 DIAGNOSIS — Z9049 Acquired absence of other specified parts of digestive tract: Secondary | ICD-10-CM | POA: Diagnosis not present

## 2021-02-08 DIAGNOSIS — K449 Diaphragmatic hernia without obstruction or gangrene: Secondary | ICD-10-CM | POA: Insufficient documentation

## 2021-02-08 DIAGNOSIS — Z7982 Long term (current) use of aspirin: Secondary | ICD-10-CM | POA: Diagnosis not present

## 2021-02-08 DIAGNOSIS — Z96653 Presence of artificial knee joint, bilateral: Secondary | ICD-10-CM | POA: Diagnosis not present

## 2021-02-08 DIAGNOSIS — K2289 Other specified disease of esophagus: Secondary | ICD-10-CM | POA: Diagnosis not present

## 2021-02-08 DIAGNOSIS — Z791 Long term (current) use of non-steroidal anti-inflammatories (NSAID): Secondary | ICD-10-CM | POA: Insufficient documentation

## 2021-02-08 DIAGNOSIS — Z86711 Personal history of pulmonary embolism: Secondary | ICD-10-CM | POA: Diagnosis not present

## 2021-02-08 HISTORY — DX: Mixed hyperlipidemia: E78.2

## 2021-02-08 HISTORY — PX: ESOPHAGOGASTRODUODENOSCOPY (EGD) WITH PROPOFOL: SHX5813

## 2021-02-08 SURGERY — ESOPHAGOGASTRODUODENOSCOPY (EGD) WITH PROPOFOL
Anesthesia: General

## 2021-02-08 MED ORDER — PROPOFOL 500 MG/50ML IV EMUL
INTRAVENOUS | Status: DC | PRN
  Administered 2021-02-08: 150 ug/kg/min via INTRAVENOUS

## 2021-02-08 MED ORDER — PROPOFOL 500 MG/50ML IV EMUL
INTRAVENOUS | Status: AC
  Filled 2021-02-08: qty 50

## 2021-02-08 MED ORDER — PROPOFOL 10 MG/ML IV BOLUS
INTRAVENOUS | Status: DC | PRN
  Administered 2021-02-08: 60 mg via INTRAVENOUS
  Administered 2021-02-08: 40 mg via INTRAVENOUS

## 2021-02-08 MED ORDER — LIDOCAINE HCL (PF) 2 % IJ SOLN
INTRAMUSCULAR | Status: AC
  Filled 2021-02-08: qty 5

## 2021-02-08 MED ORDER — LIDOCAINE HCL (CARDIAC) PF 100 MG/5ML IV SOSY
PREFILLED_SYRINGE | INTRAVENOUS | Status: DC | PRN
  Administered 2021-02-08: 100 mg via INTRAVENOUS

## 2021-02-08 MED ORDER — SODIUM CHLORIDE 0.9 % IV SOLN: INTRAVENOUS | Status: DC

## 2021-02-08 NOTE — Anesthesia Postprocedure Evaluation (Signed)
Anesthesia Post Note  Patient: Grant Diaz  Procedure(s) Performed: ESOPHAGOGASTRODUODENOSCOPY (EGD) WITH PROPOFOL (N/A )  Patient location during evaluation: PACU Anesthesia Type: General Level of consciousness: awake and alert Pain management: pain level controlled Vital Signs Assessment: post-procedure vital signs reviewed and stable Respiratory status: spontaneous breathing, nonlabored ventilation and respiratory function stable Cardiovascular status: blood pressure returned to baseline and stable Postop Assessment: no apparent nausea or vomiting Anesthetic complications: no   No complications documented.   Last Vitals:  Vitals:   02/08/21 1002 02/08/21 1004  BP: 120/74 120/74  Pulse:  64  Resp:  (!) 21  Temp: (!) 35.8 C   SpO2:  94%    Last Pain:  Vitals:   02/08/21 1002  TempSrc: Temporal  PainSc: Asleep                 Tera Mater

## 2021-02-08 NOTE — Op Note (Signed)
[facility] Gastroenterology Patient Name: Grant Diaz Procedure Date: 02/08/2021 9:16 AM MRN: [id number] Account #: [id number] Date of Birth: 03/03/1951 Admit Type: Outpatient Age: 70 Room: [address] [facility] ENDO ROOM 3 Gender: Male Note Status: Finalized Procedure:             Upper GI endoscopy Indications:           Abdominal pain, Dysphagia Providers:             Aroush Chasse B. Bonna Gains MD, MD Referring MD:          Barbaraann Faster. Ned Card (Referring MD) Medicines:             Monitored Anesthesia Care Complications:         No immediate complications. Procedure:             Pre-Anesthesia Assessment:                        - The risks and benefits of the procedure and the                         sedation options and risks were discussed with the                         patient. All questions were answered and informed                         consent was obtained.                        - Patient identification and proposed procedure were                         verified prior to the procedure.                        - ASA Grade Assessment: II - A patient with mild                         systemic disease.                        After obtaining informed consent, the endoscope was                         passed under direct vision. Throughout the procedure,                         the patient's blood pressure, pulse, and oxygen                         saturations were monitored continuously. The Endoscope                         was introduced through the mouth, and advanced to the                         second part of duodenum. The upper GI endoscopy was                         accomplished with  ease. The patient tolerated the                         procedure well. Findings:      One tongue of salmon-colored mucosa was present from 38 to 40 cm. No       other visible abnormalities were present. Mucosa was biopsied with a       cold forceps for histology. Biopsies were  obtained from the proximal and       distal esophagus with cold forceps for histology of suspected       eosinophilic esophagitis.      The exam of the esophagus was otherwise normal.      A small hiatal hernia was present.      The exam of the stomach was otherwise normal.      There is no endoscopic evidence of inflammation or ulceration in the       entire examined stomach. Biopsies were obtained in the gastric body, at       the incisura and in the gastric antrum with cold forceps for       Helicobacter pylori testing.      Patchy mild mucosal changes characterized by discoloration were found in       the second portion of the duodenum. Biopsies were taken with a cold       forceps for histology.      The exam of the duodenum was otherwise normal. Impression:            - Salmon-colored mucosa suspicious for short-segment                         Barrett's esophagus. Biopsied.                        - Small hiatal hernia.                        - Mucosal changes in the duodenum. Biopsied.                        - Biopsies were obtained in the gastric body, at the                         incisura and in the gastric antrum. Recommendation:        - Await pathology results.                        - Discuss further steps such as referral to surgeon                         for hiatal hernia after biopsy results in clinic                        - Follow an antireflux regimen.                        - Discharge patient to home.                        - Continue present medications.                        -  Resume previous diet.                        - The findings and recommendations were discussed with                         the patient.                        - The findings and recommendations were discussed with                         the patient's family.                        - Return to primary care physician as previously                         scheduled. Procedure Code(s):      --- Professional ---                        [phone], Esophagogastroduodenoscopy, flexible,                         transoral; with biopsy, single or multiple Diagnosis Code(s):     --- Professional ---                        K22.8, Other specified diseases of esophagus                        K44.9, Diaphragmatic hernia without obstruction or                         gangrene                        K31.89, Other diseases of stomach and duodenum                        R10.9, Unspecified abdominal pain                        R13.10, Dysphagia, unspecified CPT copyright 2019 American Medical Association. All rights reserved. The codes documented in this report are preliminary and upon coder review may  be revised to meet current compliance requirements.  Vonda Antigua, MD Margretta Sidle B. Bonna Gains MD, MD 02/08/2021 10:04:21 AM This report has been signed electronically. Number of Addenda: 0 Note Initiated On: 02/08/2021 9:16 AM Estimated Blood Loss:  Estimated blood loss: none.      [facility]

## 2021-02-08 NOTE — Anesthesia Preprocedure Evaluation (Signed)
Anesthesia Evaluation  Patient identified by MRN, date of birth, ID band Patient awake    Reviewed: Allergy & Precautions, H&P , NPO status , Patient's Chart, lab work & pertinent test results  History of Anesthesia Complications Negative for: history of anesthetic complications  Airway Mallampati: II  TM Distance: >3 FB     Dental  (+) Chipped   Pulmonary sleep apnea , neg COPD,    breath sounds clear to auscultation       Cardiovascular hypertension, (-) angina(-) Past MI and (-) Cardiac Stents (-) dysrhythmias  Rhythm:regular Rate:Normal     Neuro/Psych negative neurological ROS  negative psych ROS   GI/Hepatic Neg liver ROS, hiatal hernia, GERD  ,  Endo/Other  Morbid obesity  Renal/GU Renal disease (CKD)  negative genitourinary   Musculoskeletal   Abdominal   Peds  Hematology negative hematology ROS (+)   Anesthesia Other Findings Past Medical History: No date: Arthritis 2008: Cancer ([place])     Comment:  Prostate-radiation 2019: Cataract     Comment:  not yet significant enough to warrant surgery No date: Chronic kidney disease     Comment:  kidney stones  remote history No date: GERD (gastroesophageal reflux disease) No date: History of DVT (deep vein thrombosis) No date: Hypertension No date: Joint pain No date: Mixed hyperlipidemia 2013: Pancreatitis     Comment:  [facility] No date: Prostate cancer [facility]) March 2016: Pulmonary embolus ([place])     Comment:  Bilateral pulmonary embolus, [facility] 2010: Sleep apnea     Comment:  use CPAP regularly No date: Sleep apnea, obstructive     Comment:  cpap  Past Surgical History: 03/15/2017: CARPAL TUNNEL RELEASE; Right     Comment:  Procedure: CARPAL TUNNEL RELEASE;  Surgeon: Hessie Knows, MD;  Location: [facility] ORS;  Service: Orthopedics;               Laterality: Right; No date: CHOLECYSTECTOMY 04/02/2018: COLONOSCOPY WITH PROPOFOL; N/A     Comment:   Procedure: COLONOSCOPY WITH PROPOFOL;  Surgeon: Lucilla Lame, MD;  Location: [facility] ENDOSCOPY;  Service:               Endoscopy;  Laterality: N/A; 05/28/2013: ESOPHAGOGASTRODUODENOSCOPY; N/A     Comment:  Procedure: ESOPHAGOGASTRODUODENOSCOPY (EGD);  Surgeon:               Shann Medal, MD;  Location: [facility];  Service:               General;  Laterality: N/A; 2003: GALLBLADDER SURGERY 03/15/2017: GANGLION CYST EXCISION; Right     Comment:  Procedure: REMOVAL GANGLION OF WRIST;  Surgeon: Hessie Knows, MD;  Location: [facility] ORS;  Service: Orthopedics;               Laterality: Right; No date: HERNIA REPAIR 2011: hose No date: JOINT REPLACEMENT [id number]: KNEE ARTHROSCOPY; Bilateral 2009: LAPAROSCOPIC GASTRIC BANDING     Comment:  and hernia repair 2005: RADIATION IMPLANT W/ ULTRASOUND, MALE     Comment:  internal/external radiation for prostate cancer 10/12/2015: TOTAL KNEE ARTHROPLASTY; Left     Comment:  Procedure: TOTAL KNEE ARTHROPLASTY;  Surgeon: Hessie Knows, MD;  Location:  [facility] ORS;  Service: Orthopedics;                Laterality: Left; 05/15/2017: TOTAL KNEE ARTHROPLASTY; Right     Comment:  Procedure: TOTAL KNEE ARTHROPLASTY;  Surgeon: Hessie Knows, MD;  Location: [facility] ORS;  Service: Orthopedics;               Laterality: Right; 6378: UMBILICAL HERNIA REPAIR  BMI    Body Mass Index: 39.53 kg/m      Reproductive/Obstetrics negative OB ROS                             Anesthesia Physical Anesthesia Plan  ASA: III  Anesthesia Plan: General   Post-op Pain Management:    Induction:   PONV Risk Score and Plan: Propofol infusion and TIVA  Airway Management Planned:   Additional Equipment:   Intra-op Plan:   Post-operative Plan:   Informed Consent: I have reviewed the patients History and Physical, chart, labs and discussed the procedure including the risks, benefits and  alternatives for the proposed anesthesia with the patient or authorized representative who has indicated his/her understanding and acceptance.     Dental Advisory Given  Plan Discussed with: Anesthesiologist, CRNA and Surgeon  Anesthesia Plan Comments:         Anesthesia Quick Evaluation

## 2021-02-08 NOTE — H&P (Signed)
Vonda Antigua, MD [address] [place], [place], [place], [address] [address], [place], [place], [place], [address] Phone: [phone]  Fax: [phone]  Primary Care Physician:  Venita Lick, NP   Pre-Procedure History & Physical: HPI:  Grant Diaz is a 70 y.o. male is here for an EGD.   Past Medical History:  Diagnosis Date  . Arthritis   . Cancer [facility]) 2008   Prostate-radiation  . Cataract 2019   not yet significant enough to warrant surgery  . Chronic kidney disease    kidney stones  remote history  . GERD (gastroesophageal reflux disease)   . History of DVT (deep vein thrombosis)   . Hypertension   . Joint pain   . Mixed hyperlipidemia   . Pancreatitis 2013   [facility]  . Prostate cancer ([place])   . Pulmonary embolus [facility]) March 2016   Bilateral pulmonary embolus, [facility]  . Sleep apnea 2010   use CPAP regularly  . Sleep apnea, obstructive    cpap    Past Surgical History:  Procedure Laterality Date  . CARPAL TUNNEL RELEASE Right 03/15/2017   Procedure: CARPAL TUNNEL RELEASE;  Surgeon: Hessie Knows, MD;  Location: [facility] ORS;  Service: Orthopedics;  Laterality: Right;  . CHOLECYSTECTOMY    . COLONOSCOPY WITH PROPOFOL N/A 04/02/2018   Procedure: COLONOSCOPY WITH PROPOFOL;  Surgeon: Lucilla Lame, MD;  Location: [facility] ENDOSCOPY;  Service: Endoscopy;  Laterality: N/A;  . ESOPHAGOGASTRODUODENOSCOPY N/A 05/28/2013   Procedure: ESOPHAGOGASTRODUODENOSCOPY (EGD);  Surgeon: Shann Medal, MD;  Location: [facility];  Service: General;  Laterality: N/A;  . GALLBLADDER SURGERY  2003  . GANGLION CYST EXCISION Right 03/15/2017   Procedure: REMOVAL GANGLION OF WRIST;  Surgeon: Hessie Knows, MD;  Location: [facility] ORS;  Service: Orthopedics;  Laterality: Right;  . HERNIA REPAIR    . hose  2011  . JOINT REPLACEMENT    . KNEE ARTHROSCOPY Bilateral [id number]  . LAPAROSCOPIC GASTRIC BANDING  2009   and hernia repair  . RADIATION IMPLANT W/ ULTRASOUND, MALE  2005    internal/external radiation for prostate cancer  . TOTAL KNEE ARTHROPLASTY Left 10/12/2015   Procedure: TOTAL KNEE ARTHROPLASTY;  Surgeon: Hessie Knows, MD;  Location: [facility] ORS;  Service: Orthopedics;  Laterality: Left;  . TOTAL KNEE ARTHROPLASTY Right 05/15/2017   Procedure: TOTAL KNEE ARTHROPLASTY;  Surgeon: Hessie Knows, MD;  Location: [facility] ORS;  Service: Orthopedics;  Laterality: Right;  . UMBILICAL HERNIA REPAIR  2010    Prior to Admission medications   Medication Sig Start Date End Date Taking? Authorizing Provider  amLODipine (NORVASC) 10 MG tablet Take 1 tablet (10 mg total) by mouth daily. 11/24/20  Yes Cannady, Henrine Screws T, NP  celecoxib (CELEBREX) 200 MG capsule Take 1 capsule (200 mg total) by mouth daily. 10/27/20  Yes Cannady, Henrine Screws T, NP  lisinopril-hydrochlorothiazide (ZESTORETIC) 20-25 MG tablet Take 1 tablet by mouth once daily 12/23/20  Yes Cannady, Jolene T, NP  omeprazole (PRILOSEC) 20 MG capsule Take 1 capsule (20 mg total) by mouth daily. 09/07/20  Yes Cannady, Jolene T, NP  rosuvastatin (CRESTOR) 10 MG tablet Take 1 tablet (10 mg total) by mouth daily. 11/29/20  Yes Cannady, Henrine Screws T, NP  acetaminophen (TYLENOL) 325 MG tablet Take 650 mg by mouth every 6 (six) hours as needed. Patient not taking: Reported on 01/27/2021    [provider]  aspirin EC 81 MG tablet Take 81 mg by mouth daily.    [provider]  Cholecalciferol 25 MCG (  1000 UT) capsule Take 1,000 Units by mouth daily.    [provider]  ketoconazole (NIZORAL) 2 % cream Apply 1 application topically daily as needed. 11/10/20   Marnee Guarneri T, NP  Multiple Vitamin (MULTIVITAMIN) capsule Take 1 capsule by mouth daily.    [provider]  neomycin-polymyxin-hydrocortisone (CORTISPORIN) OTIC solution Place 3 drops into both ears 3 (three) times daily. 10/13/20   Cannady, Henrine Screws T, NP  triamcinolone (KENALOG) 0.025 % cream Apply 1 application topically 2 (two) times daily as needed (skin  rash). 11/10/20   Marnee Guarneri T, NP    Allergies as of 01/28/2021 - Review Complete 01/27/2021  Allergen Reaction Noted  . Adhesive [tape] Rash 09/29/2015    Family History  Problem Relation Age of Onset  . Diabetes Mother   . Arthritis Mother   . Obesity Mother   . Parkinson's disease Father   . Hypertension Brother   . Arthritis Brother   . Asthma Brother   . Diabetes Son        type 1  . Heart disease Maternal Grandmother        CHF  . Stroke Maternal Grandfather   . Heart disease Paternal Grandmother        CHF  . Stroke Paternal Grandfather     Social History   Socioeconomic History  . Marital status: Married    Spouse name: Not on file  . Number of children: Not on file  . Years of education: Not on file  . Highest education level: Not on file  Occupational History  . Occupation: pastor   Tobacco Use  . Smoking status: Never Smoker  . Smokeless tobacco: Never Used  Vaping Use  . Vaping Use: Never used  Substance and Sexual Activity  . Alcohol use: Not Currently    Comment: quit using alcohol upon entering [place]  . Drug use: Never  . Sexual activity: Not Currently    Birth control/protection: None  Other Topics Concern  . Not on file  Social History Narrative  . Not on file   Social Determinants of Health   Financial Resource Strain: Low Risk   . Difficulty of Paying Living Expenses: Not hard at all  Food Insecurity: No Food Insecurity  . Worried About Charity fundraiser in the Last Year: Never true  . Ran Out of Food in the Last Year: Never true  Transportation Needs: No Transportation Needs  . Lack of Transportation (Medical): No  . Lack of Transportation (Non-Medical): No  Physical Activity: Insufficiently Active  . Days of Exercise per Week: 4 days  . Minutes of Exercise per Session: 30 min  Stress: Not on file  Social Connections: Socially Integrated  . Frequency of Communication with Friends and Family: More than three times a  week  . Frequency of Social Gatherings with Friends and Family: More than three times a week  . Attends Religious Services: More than 4 times per year  . Active Member of Clubs or Organizations: Yes  . Attends Archivist Meetings: More than 4 times per year  . Marital Status: Married  Human resources officer Violence: Not on file    Review of Systems: See HPI, otherwise negative ROS  Physical Exam: BP (!) 168/91   Pulse 62   Temp (!) 96.1 F (35.6 C) (Temporal)   Resp 18   Ht 5\' 8"  (1.727 m)   Wt 117.9 kg   SpO2 100%   BMI 39.53 kg/m  General:  Alert,  pleasant and cooperative in NAD Head:  Normocephalic and atraumatic. Neck:  Supple; no masses or thyromegaly. Lungs:  Clear throughout to auscultation, normal respiratory effort.    Heart:  +S1, +S2, Regular rate and rhythm, No edema. Abdomen:  Soft, nontender and nondistended. Normal bowel sounds, without guarding, and without rebound.   Neurologic:  Alert and  oriented x4;  grossly normal neurologically.  Impression/Plan: Grant Diaz is here for an EGD for dysphagia, abdominal pain  Risks, benefits, limitations, and alternatives regarding the procedure have been reviewed with the patient.  Questions have been answered.  All parties agreeable.   Virgel Manifold, MD  02/08/2021, 9:32 AM

## 2021-02-08 NOTE — Transfer of Care (Signed)
Immediate Anesthesia Transfer of Care Note  Patient: Grant Diaz  Procedure(s) Performed: ESOPHAGOGASTRODUODENOSCOPY (EGD) WITH PROPOFOL (N/A )  Patient Location: PACU and Endoscopy Unit  Anesthesia Type:General  Level of Consciousness: drowsy  Airway & Oxygen Therapy: Patient Spontanous Breathing  Post-op Assessment: Report given to RN and Post -op Vital signs reviewed and stable  Post vital signs: Reviewed and stable  Last Vitals:  Vitals Value Taken Time  BP 120/74 02/08/21 1004  Temp 35.8 C 02/08/21 1002  Pulse 64 02/08/21 1004  Resp 21 02/08/21 1004  SpO2 94 % 02/08/21 1004    Last Pain:  Vitals:   02/08/21 1002  TempSrc: Temporal  PainSc: Asleep         Complications: No complications documented.

## 2021-02-09 ENCOUNTER — Encounter: Payer: Self-pay | Admitting: Gastroenterology

## 2021-02-10 LAB — SURGICAL PATHOLOGY

## 2021-02-22 ENCOUNTER — Telehealth: Payer: Self-pay

## 2021-02-22 DIAGNOSIS — K449 Diaphragmatic hernia without obstruction or gangrene: Secondary | ICD-10-CM

## 2021-02-22 NOTE — Telephone Encounter (Signed)
-----   Message from Virgel Manifold, MD sent at 02/10/2021  3:13 PM EDT ----- Herb Grays please let the patient know, his biopsies show Barrett's esophagus.  Low-dose PPI use is recommended indefinitely to prevent this from worsening.  He is on omeprazole for this and this should be continued.  I recommend evaluation by surgery due to hiatal hernia as this may be leading to his symptoms.  Unless patient has a preference for another surgeon, please refer him to [facility] for symptomatic hiatal hernia  Clinic follow-up in 2 to 3 months

## 2021-02-22 NOTE — Addendum Note (Signed)
Addended by: Wayna Chalet on: 02/22/2021 11:54 AM   Modules accepted: Orders

## 2021-02-22 NOTE — Telephone Encounter (Signed)
Called patient and Dr. Bonna Gains had messaged him as well through [place]. Patient's follow up appointment was made per Dr. Michele Mcalpine request.

## 2021-03-05 ENCOUNTER — Encounter: Payer: Self-pay | Admitting: Nurse Practitioner

## 2021-03-10 ENCOUNTER — Ambulatory Visit (INDEPENDENT_AMBULATORY_CARE_PROVIDER_SITE_OTHER): Payer: Medicare PPO | Admitting: Nurse Practitioner

## 2021-03-10 ENCOUNTER — Encounter: Payer: Self-pay | Admitting: Nurse Practitioner

## 2021-03-10 ENCOUNTER — Other Ambulatory Visit: Payer: Self-pay

## 2021-03-10 VITALS — BP 129/76 | HR 51 | Temp 98.0°F | Wt 258.2 lb

## 2021-03-10 DIAGNOSIS — E782 Mixed hyperlipidemia: Secondary | ICD-10-CM

## 2021-03-10 DIAGNOSIS — K449 Diaphragmatic hernia without obstruction or gangrene: Secondary | ICD-10-CM | POA: Diagnosis not present

## 2021-03-10 DIAGNOSIS — K2270 Barrett's esophagus without dysplasia: Secondary | ICD-10-CM | POA: Diagnosis not present

## 2021-03-10 NOTE — Assessment & Plan Note (Signed)
Will review notes from [facility] once available.

## 2021-03-10 NOTE — Assessment & Plan Note (Signed)
Chronic, ongoing.  Poor tolerance of daily Crestor at low dose.  Will recheck lipid panel today and may restart statin at three days a week or once a week to trial.  Educated patient on statin use and side effects.

## 2021-03-10 NOTE — Patient Instructions (Signed)
Hiatal Hernia  A hiatal hernia occurs when part of the stomach slides above the muscle that separates the abdomen from the chest (diaphragm). A person can be born with a hiatal hernia (congenital), or it may develop over time. In almost all cases of hiatal hernia, only the top part of the stomach pushes through the diaphragm. Many people have a hiatal hernia with no symptoms. The larger the hernia, the more likely it is that you will have symptoms. In some cases, a hiatal hernia allows stomach acid to flow back into the tube that carries food from your mouth to your stomach (esophagus). This may cause heartburn symptoms. Severe heartburn symptoms may mean that you have developed a condition called gastroesophageal reflux disease (GERD). What are the causes? This condition is caused by a weakness in the opening (hiatus) where the esophagus passes through the diaphragm to attach to the upper part of the stomach. A person may be born with a weakness in the hiatus, or a weakness can develop over time. What increases the risk? This condition is more likely to develop in:  Older people. Age is a major risk factor for a hiatal hernia, especially if you are over the age of 50.  Pregnant women.  People who are overweight.  People who have frequent constipation. What are the signs or symptoms? Symptoms of this condition usually develop in the form of GERD symptoms. Symptoms include:  Heartburn.  Belching.  Indigestion.  Trouble swallowing.  Coughing or wheezing.  Sore throat.  Hoarseness.  Chest pain.  Nausea and vomiting. How is this diagnosed? This condition may be diagnosed during testing for GERD. Tests that may be done include:  X-rays of your stomach or chest.  An upper gastrointestinal (GI) series. This is an X-ray exam of your GI tract that is taken after you swallow a chalky liquid that shows up clearly on the X-ray.  Endoscopy. This is a procedure to look into your stomach  using a thin, flexible tube that has a tiny camera and light on the end of it. How is this treated? This condition may be treated by:  Dietary and lifestyle changes to help reduce GERD symptoms.  Medicines. These may include: ? Over-the-counter antacids. ? Medicines that make your stomach empty more quickly. ? Medicines that block the production of stomach acid (H2 blockers). ? Stronger medicines to reduce stomach acid (proton pump inhibitors).  Surgery to repair the hernia, if other treatments are not helping. If you have no symptoms, you may not need treatment. Follow these instructions at home: Lifestyle and activity  Do not use any products that contain nicotine or tobacco, such as cigarettes and e-cigarettes. If you need help quitting, ask your health care provider.  Try to achieve and maintain a healthy body weight.  Avoid putting pressure on your abdomen. Anything that puts pressure on your abdomen increases the amount of acid that may be pushed up into your esophagus. ? Avoid bending over, especially after eating. ? Raise the head of your bed by putting blocks under the legs. This keeps your head and esophagus higher than your stomach. ? Do not wear tight clothing around your chest or stomach. ? Try not to strain when having a bowel movement, when urinating, or when lifting heavy objects. Eating and drinking  Avoid foods that can worsen GERD symptoms. These may include: ? Fatty foods, like fried foods. ? Citrus fruits, like oranges or lemon. ? Other foods and drinks that contain acid, like   orange juice or tomatoes. ? Spicy food. ? Chocolate.  Eat frequent small meals instead of three large meals a day. This helps prevent your stomach from getting too full. ? Eat slowly. ? Do not lie down right after eating. ? Do not eat 1-2 hours before bed.  Do not drink beverages with caffeine. These include cola, coffee, cocoa, and tea.  Do not drink alcohol. General  instructions  Take over-the-counter and prescription medicines only as told by your health care provider.  Keep all follow-up visits as told by your health care provider. This is important. Contact a health care provider if:  Your symptoms are not controlled with medicines or lifestyle changes.  You are having trouble swallowing.  You have coughing or wheezing that will not go away. Get help right away if:  Your pain is getting worse.  Your pain spreads to your arms, neck, jaw, teeth, or back.  You have shortness of breath.  You sweat for no reason.  You feel sick to your stomach (nauseous) or you vomit.  You vomit blood.  You have bright red blood in your stools.  You have black, tarry stools. This information is not intended to replace advice given to you by your health care provider. Make sure you discuss any questions you have with your health care provider. Document Revised: 09/21/2017 Document Reviewed: 05/14/2017 Elsevier Patient Education  [place].

## 2021-03-10 NOTE — Progress Notes (Signed)
BP 129/76 (BP Location: Left Arm, Cuff Size: Normal)   Pulse (!) 51   Temp 98 F (36.7 C) (Oral)   Wt 258 lb 3.2 oz (117.1 kg)   SpO2 100%   BMI 39.26 kg/m    Subjective:    Patient ID: Grant Diaz, male    DOB: Dec 01, 1950, 70 y.o.   MRN: [id number]  HPI: Grant Diaz is a 70 y.o. male  Chief Complaint  Patient presents with  . Medication Problem    Patient states the medication prescribed for his cholesterol, he has was not able to sleep. Patient states since he has stop the medication and has been able to sleep. Patient would like to discuss different treatment options.    GERD Recently saw GI and had EGD -- noted Barrett's esophagus -- is recommended to continue PPI long term.  Had history of pancreatitis in 2021.  Has a referral to [facility] via [facility] due to hiatal hernia -- has symptoms of occasional SOB and GERD.  Is going to see them June 7th.  Does have known hiatal hernia -- had history of lap band and mesh placed with hernia.  Taking Prilosec every day which is helping GERD. Recent labs showed Amylase of 118, but improved Lipase (26) -- GGT was showing trend down at 69 (no gall bladder) and AST/ALT 22/18.  In 2010 CT showed possible pancreatic mass, but MRI showed no mass.  GERD control status: controlled  Satisfied with current treatment? yes Heartburn frequency: very little Medication side effects: no  Medication compliance: stable Previous GERD medications: Antacid use frequency: "occasionally" in the evening after a larger meal   Aggravating factors: Larger meals, greasy food Dysphagia: no - pt reports a lap band and food will "back up" with larger meals Odynophagia:  no Hematemesis: no Blood in stool: no EGD: yes   HYPERLIPIDEMIA Started on Rosuvastatin 10 MG March 2022 for elevation in LDL.  That caused aches in arms and could not sleep, stopped statin and sleeping better.   Hyperlipidemia status: good compliance Satisfied with current treatment?   yes Side effects:  occasional muscle aches Medication compliance: good compliance Supplements: none Aspirin:  yes The ASCVD Risk score Mikey Bussing DC Jr., et al., 2013) failed to calculate for the following reasons:   The valid total cholesterol range is 130 to 320 mg/dL Chest pain:  no Coronary artery disease:  no Family history CAD:  yes -- father and grandfather Family history early CAD:  no  Relevant past medical, surgical, family and social history reviewed and updated as indicated. Interim medical history since our last visit reviewed. Allergies and medications reviewed and updated.  Review of Systems  Constitutional: Negative for activity change, diaphoresis, fatigue and fever.  Respiratory: Negative for cough, chest tightness, shortness of breath and wheezing.   Cardiovascular: Negative for chest pain, palpitations and leg swelling.  Gastrointestinal: Negative.   Neurological: Negative.   Psychiatric/Behavioral: Negative.     Per HPI unless specifically indicated above     Objective:    BP 129/76 (BP Location: Left Arm, Cuff Size: Normal)   Pulse (!) 51   Temp 98 F (36.7 C) (Oral)   Wt 258 lb 3.2 oz (117.1 kg)   SpO2 100%   BMI 39.26 kg/m   Wt Readings from Last 3 Encounters:  03/10/21 258 lb 3.2 oz (117.1 kg)  02/08/21 260 lb (117.9 kg)  01/27/21 264 lb 12.8 oz (120.1 kg)    Physical Exam Vitals and  nursing note reviewed.  Constitutional:      General: He is awake. He is not in acute distress.    Appearance: He is well-developed. He is obese. He is not ill-appearing.  HENT:     Head: Normocephalic and atraumatic.     Right Ear: Hearing normal. No drainage.     Left Ear: Hearing normal. No drainage.  Eyes:     General: Lids are normal.        Right eye: No discharge.        Left eye: No discharge.     Conjunctiva/sclera: Conjunctivae normal.     Pupils: Pupils are equal, round, and reactive to light.  Neck:     Vascular: No carotid bruit.  Cardiovascular:      Rate and Rhythm: Normal rate and regular rhythm.     Heart sounds: Normal heart sounds, S1 normal and S2 normal. No murmur heard. No gallop.   Pulmonary:     Effort: Pulmonary effort is normal. No accessory muscle usage or respiratory distress.     Breath sounds: Normal breath sounds.  Abdominal:     General: Bowel sounds are normal. There is no distension.     Palpations: Abdomen is soft.     Tenderness: There is no abdominal tenderness. There is no right CVA tenderness or left CVA tenderness.     Hernia: No hernia is present.  Musculoskeletal:        General: Normal range of motion.     Cervical back: Normal range of motion and neck supple.     Right lower leg: No edema.     Left lower leg: No edema.  Skin:    General: Skin is warm and dry.  Neurological:     Mental Status: He is alert and oriented to person, place, and time.  Psychiatric:        Attention and Perception: Attention normal.        Mood and Affect: Mood normal.        Speech: Speech normal.        Behavior: Behavior normal. Behavior is cooperative.        Thought Content: Thought content normal.    Results for orders placed or performed during the hospital encounter of 02/08/21  Surgical pathology  Result Value Ref Range   SURGICAL PATHOLOGY      SURGICAL PATHOLOGY CASE: [id number] PATIENT: Grant Diaz Surgical Pathology Report     Specimen Submitted: A. Duodenum; cbx B. Stomach; cbx C. Esophagus, Barrett's; cbx D. Esophagus, for EOE; cbx  Clinical History: Dysphagia R13.10.  Duodenal mucosal changes; Barrett's esophagus      DIAGNOSIS: A. DUODENUM; COLD BIOPSY: - DUODENAL MUCOSA WITH NO SIGNIFICANT PATHOLOGIC ALTERATION. - NEGATIVE FOR FEATURES OF CELIAC DISEASE. - NEGATIVE FOR DYSPLASIA AND MALIGNANCY.  B. STOMACH; COLD BIOPSY: - CHRONIC AND FOCALLY ACTIVE GASTRITIS. - NEGATIVE FOR HELICOBACTER PYLORI BY IMMUNOHISTOCHEMISTRY. - NEGATIVE FOR INTESTINAL METAPLASIA, DYSPLASIA, AND  MALIGNANCY.  C. ESOPHAGUS; COLD BIOPSY: - BARRETT'S ESOPHAGUS. - NEGATIVE FOR DYSPLASIA AND MALIGNANCY.  D. ESOPHAGUS; COLD BIOPSY: - SQUAMOUS MUCOSA WITH NO SIGNIFICANT PATHOLOGIC ALTERATION. - NEGATIVE FOR INCREASED EOSINOPHILS. - NEGATIVE FOR INTESTINAL METAPLASIA, DYSPLASIA, AND MALIGNANCY.  Comment: Im munohistochemical stain for H pylori (block B1) is negative.  IHC slides were prepared by Launa Grill, [place]. All controls stained appropriately.  This test was developed and its performance characteristics determined by [facility]. It has not been cleared or approved by the [place] Food and Drug Administration. The FDA  does not require this test to go through premarket FDA review. This test is used for clinical purposes. It should not be regarded as investigational or for research. This laboratory is certified under the Clinical Laboratory Improvement Amendments (CLIA) as qualified to perform high complexity clinical laboratory testing.    GROSS DESCRIPTION: A. Labeled: cbx mucosal changes duodenum Received: Formalin Collection time: 9:48 AM on 02/08/2021 Placed into formalin time: 9:48 AM on 02/08/2021 Tissue fragment(s): 1 Size: 0.5 x 0.5 x 0.2 cm Description: Tan-pink soft tissue fragment Entirely submitted in 1 cassette.  B. Labeled: Gastric cbx rule out H. pylo ri Received: Formalin Collection time: 9:49 AM on 02/08/2021 Placed into formalin time: 9:49 AM on 02/08/2021 Tissue fragment(s): 3 Size: Aggregate, 1.2 x 0.5 x 0.2 cm Description: Tan soft tissue fragments Entirely submitted in 1 cassette.  C. Labeled: cbx Barrett's esophagus Received: Formalin Collection time: 9:51 AM on 02/08/2021 Placed into formalin time: 9:51 AM on 02/08/2021 Tissue fragment(s): Multiple Size: Aggregate, 0.8 x 0.7 x 0.2 cm Description: Tan-pink soft tissue fragments Entirely submitted in 1 cassette.  D. Labeled: Esophagus cbx for EOE Received: Formalin Collection time: 9:55 AM on  02/08/2021 Placed into formalin time: 9:55 AM on 02/08/2021 Tissue fragment(s): Multiple Size: Aggregate, 0.9 x 0.5 x 0.1 cm Description: White-pink translucent soft tissue fragments Entirely submitted in 1 cassette.  RB 02/08/2021  Final Diagnosis performed by Betsy Pries, MD.   Electronically signed 02/10/2021 2:38:15PM The electronic signature indicates that  the named Attending Pathologist has evaluated the specimen Technical component performed at [facility], [address], [place], [place] [address] Lab: [phone] Dir: Rush Farmer, MD, MMM  Professional component performed at [facility], [facility], [place], [place],  [address] Lab: [phone] Dir: Dellia Nims. Reuel Derby, MD       Assessment & Plan:   Problem List Items Addressed This Visit      Digestive   Barrett esophagus    Chronic, ongoing noted on EGD.   Continue current medication regimen and adjust as needed, lifelong PPI.  Return in 6 months and continue collaboration with GI.        Other   Hiatal hernia    Will review notes from [facility] once available.       Mixed hyperlipidemia - Primary    Chronic, ongoing.  Poor tolerance of daily Crestor at low dose.  Will recheck lipid panel today and may restart statin at three days a week or once a week to trial.  Educated patient on statin use and side effects.      Relevant Orders   Lipid Panel w/o Chol/HDL Ratio       Follow up plan: Return for as scheduled.

## 2021-03-10 NOTE — Assessment & Plan Note (Signed)
Chronic, ongoing noted on EGD.   Continue current medication regimen and adjust as needed, lifelong PPI.  Return in 6 months and continue collaboration with GI.

## 2021-03-11 LAB — LIPID PANEL W/O CHOL/HDL RATIO
Cholesterol, Total: 156 mg/dL (ref 100–199)
HDL: 45 mg/dL (ref >39)
LDL Chol Calc (NIH): 100 mg/dL — ABNORMAL HIGH (ref 0–99)
Triglycerides: 53 mg/dL (ref 0–149)
VLDL Cholesterol Cal: 11 mg/dL (ref 5–40)

## 2021-03-11 NOTE — Progress Notes (Signed)
Contacted via [place] afternoon [place], your cholesterol levels have returned and LDL has crept back up to 100, was 66 on medication (at goal).  I would try like we discussed taking Rosuvastatin 3 days a week, if can not tolerate then try one day a week and we will recheck next visit.  Let me know how this goes.  Any questions? Keep being awesome!!  Thank you for allowing me to participate in your care.  I appreciate you. Kindest regards, Larisha Vencill

## 2021-03-16 ENCOUNTER — Telehealth: Payer: Self-pay | Admitting: Nurse Practitioner

## 2021-03-16 NOTE — Telephone Encounter (Signed)
Copied from [place] [phone]. Topic: Medicare AWV >> Mar 16, 2021 10:23 AM Cher Nakai R wrote: Reason for CRM:  LM re: [facility] schedule change - AWVS rescheduled from 6/9 to 6/9 at 8:15 to be completed by phone not inthe office-srs

## 2021-03-23 ENCOUNTER — Other Ambulatory Visit: Payer: Self-pay | Admitting: Nurse Practitioner

## 2021-03-23 MED ORDER — ROSUVASTATIN CALCIUM 10 MG PO TABS: ORAL_TABLET | ORAL | 4 refills | Status: DC

## 2021-03-28 ENCOUNTER — Ambulatory Visit: Payer: Medicare PPO

## 2021-03-28 ENCOUNTER — Other Ambulatory Visit (HOSPITAL_COMMUNITY): Payer: Self-pay | Admitting: General Surgery

## 2021-03-28 ENCOUNTER — Other Ambulatory Visit: Payer: Self-pay | Admitting: General Surgery

## 2021-03-28 DIAGNOSIS — K449 Diaphragmatic hernia without obstruction or gangrene: Secondary | ICD-10-CM

## 2021-03-31 ENCOUNTER — Ambulatory Visit: Payer: Medicare PPO

## 2021-04-01 ENCOUNTER — Ambulatory Visit: Payer: Medicare PPO

## 2021-04-05 ENCOUNTER — Other Ambulatory Visit: Payer: Self-pay

## 2021-04-05 ENCOUNTER — Ambulatory Visit (HOSPITAL_COMMUNITY)
Admission: RE | Admit: 2021-04-05 | Discharge: 2021-04-05 | Disposition: A | Discharge status: Home / Self Care | Payer: Medicare PPO | Source: Ambulatory Visit | Attending: General Surgery | Admitting: General Surgery

## 2021-04-05 DIAGNOSIS — K449 Diaphragmatic hernia without obstruction or gangrene: Secondary | ICD-10-CM

## 2021-04-15 ENCOUNTER — Ambulatory Visit (INDEPENDENT_AMBULATORY_CARE_PROVIDER_SITE_OTHER): Payer: Medicare PPO

## 2021-04-15 VITALS — Ht 68.0 in | Wt 255.0 lb

## 2021-04-15 DIAGNOSIS — Z0000 Encounter for general adult medical examination without abnormal findings: Secondary | ICD-10-CM | POA: Diagnosis not present

## 2021-04-15 NOTE — Progress Notes (Signed)
I connected with Grant Diaz today by telephone and verified that I am speaking with the correct person using two identifiers. Location patient: home Location provider: work Persons participating in the virtual visit: Akul Leggette, Glenna Durand LPN.   I discussed the limitations, risks, security and privacy concerns of performing an evaluation and management service by telephone and the availability of in person appointments. I also discussed with the patient that there may be a patient responsible charge related to this service. The patient expressed understanding and verbally consented to this telephonic visit.    Interactive audio and video telecommunications were attempted between this provider and patient, however failed, due to patient having technical difficulties OR patient did not have access to video capability.  We continued and completed visit with audio only.     Vital signs may be patient reported or missing.  Subjective:   Grant Diaz is a 70 y.o. male who presents for Medicare Annual/Subsequent preventive examination.  Review of Systems     Cardiac Risk Factors include: advanced age (>46men, >74 women);dyslipidemia;hypertension;male gender;obesity (BMI >30kg/m2);sedentary lifestyle     Objective:    Today's Vitals   04/15/21 0811  Weight: 255 lb (115.7 kg)  Height: 5\' 8"  (1.727 m)   Body mass index is 38.77 kg/m.  Advanced Directives 04/15/2021 02/08/2021 03/24/2020 04/02/2018 05/15/2017 05/09/2017 03/09/2017  Does Patient Have a Medical Advance Directive? Yes Yes Yes Yes Yes Yes Yes  Type of Paramedic of [place];Living will - Living will;Healthcare Power of [place];Living will [place];Living will [place];Living will  Does patient want to make changes to medical advance directive? - - - - No - Patient declined - No - Patient declined  Copy of [place] in Chart? No - copy requested - Yes - validated most recent copy scanned in chart (See row information) - No - copy requested - -    Current Medications (verified) Outpatient Encounter Medications as of 04/15/2021  Medication Sig   acetaminophen (TYLENOL) 325 MG tablet Take 650 mg by mouth every 6 (six) hours as needed.   amLODipine (NORVASC) 10 MG tablet Take 1 tablet (10 mg total) by mouth daily.   aspirin EC 81 MG tablet Take 81 mg by mouth daily.   celecoxib (CELEBREX) 200 MG capsule Take 1 capsule (200 mg total) by mouth daily.   Cholecalciferol 25 MCG (1000 UT) capsule Take 1,000 Units by mouth daily.   ketoconazole (NIZORAL) 2 % cream Apply 1 application topically daily as needed.   lisinopril-hydrochlorothiazide (ZESTORETIC) 20-25 MG tablet Take 1 tablet by mouth once daily   Multiple Vitamin (MULTIVITAMIN) capsule Take 1 capsule by mouth daily.   neomycin-polymyxin-hydrocortisone (CORTISPORIN) OTIC solution Place 3 drops into both ears 3 (three) times daily.   omeprazole (PRILOSEC) 20 MG capsule Take 1 capsule (20 mg total) by mouth daily.   rosuvastatin (CRESTOR) 10 MG tablet Take one tablet (10 MG) by mouth three times a week.   triamcinolone (KENALOG) 0.025 % cream Apply 1 application topically 2 (two) times daily as needed (skin rash).   No facility-administered encounter medications on file as of 04/15/2021.    Allergies (verified) Adhesive [tape]   History: Past Medical History:  Diagnosis Date   Arthritis    Barrett's esophagus    Cancer ([place]) 2008   Prostate-radiation   Cataract 2019   not yet significant enough to warrant surgery   Chronic kidney disease  kidney stones  remote history   GERD (gastroesophageal reflux disease)    History of DVT (deep vein thrombosis)    Hypertension    Joint pain    Mixed hyperlipidemia    Pancreatitis 2013   [facility]   Prostate cancer [facility])    Pulmonary embolus [facility]) March 2016   Bilateral pulmonary embolus, [facility]    Sleep apnea 2010   use CPAP regularly   Sleep apnea, obstructive    cpap   Past Surgical History:  Procedure Laterality Date   CARPAL TUNNEL RELEASE Right 03/15/2017   Procedure: CARPAL TUNNEL RELEASE;  Surgeon: Hessie Knows, MD;  Location: [facility] ORS;  Service: Orthopedics;  Laterality: Right;   CHOLECYSTECTOMY     COLONOSCOPY WITH PROPOFOL N/A 04/02/2018   Procedure: COLONOSCOPY WITH PROPOFOL;  Surgeon: Lucilla Lame, MD;  Location: [facility] ENDOSCOPY;  Service: Endoscopy;  Laterality: N/A;   ESOPHAGOGASTRODUODENOSCOPY N/A 05/28/2013   Procedure: ESOPHAGOGASTRODUODENOSCOPY (EGD);  Surgeon: Shann Medal, MD;  Location: [facility] END[facility];  Service: General;  Laterality: N/A;   ESOPHAGOGASTRODUODENOSCOPY (EGD) WITH PROPOFOL N/A 02/08/2021   Procedure: ESOPHAGOGASTRODUODENOSCOPY (EGD) WITH PROPOFOL;  Surgeon: Virgel Manifold, MD;  Location: [facility] ENDOSCOPY;  Service: Endoscopy;  Laterality: N/A;   GALLBLADDER SURGERY  2003   GANGLION CYST EXCISION Right 03/15/2017   Procedure: REMOVAL GANGLION OF WRIST;  Surgeon: Hessie Knows, MD;  Location: [facility] ORS;  Service: Orthopedics;  Laterality: Right;   HERNIA REPAIR     hose  2011   JOINT REPLACEMENT     KNEE ARTHROSCOPY Bilateral [id number]   LAPAROSCOPIC GASTRIC BANDING  2009   and hernia repair   RADIATION IMPLANT W/ ULTRASOUND, MALE  2005   internal/external radiation for prostate cancer   TOTAL KNEE ARTHROPLASTY Left 10/12/2015   Procedure: TOTAL KNEE ARTHROPLASTY;  Surgeon: Hessie Knows, MD;  Location: [facility] ORS;  Service: Orthopedics;  Laterality: Left;   TOTAL KNEE ARTHROPLASTY Right 05/15/2017   Procedure: TOTAL KNEE ARTHROPLASTY;  Surgeon: Hessie Knows, MD;  Location: [facility] ORS;  Service: Orthopedics;  Laterality: Right;   UMBILICAL HERNIA REPAIR  2010   Family History  Problem Relation Age of Onset   Diabetes Mother    Arthritis Mother    Obesity Mother    Parkinson's disease Father    Hypertension Brother    Arthritis Brother    Asthma  Brother    Diabetes Son        type 1   Heart disease Maternal Grandmother        CHF   Stroke Maternal Grandfather    Heart disease Paternal Grandmother        CHF   Stroke Paternal Grandfather    Social History   Socioeconomic History   Marital status: Married    Spouse name: Not on file   Number of children: Not on file   Years of education: Not on file   Highest education level: Not on file  Occupational History   Occupation: pastor   Tobacco Use   Smoking status: Never   Smokeless tobacco: Never  Vaping Use   Vaping Use: Never used  Substance and Sexual Activity   Alcohol use: Not Currently    Comment: quit using alcohol upon entering ministry 1977   Drug use: Never   Sexual activity: Not Currently    Birth control/protection: None  Other Topics Concern   Not on file  Social History Narrative   Not on file   Social Determinants of Health   Financial Resource Strain:  Low Risk    Difficulty of Paying Living Expenses: Not hard at all  Food Insecurity: No Food Insecurity   Worried About Running Out of Food in the Last Year: Never true   Ran Out of Food in the Last Year: Never true  Transportation Needs: No Transportation Needs   Lack of Transportation (Medical): No   Lack of Transportation (Non-Medical): No  Physical Activity: Inactive   Days of Exercise per Week: 0 days   Minutes of Exercise per Session: 0 min  Stress: No Stress Concern Present   Feeling of Stress : Not at all  Social Connections: Not on file    Tobacco Counseling Counseling given: Not Answered   Clinical Intake:  Pre-visit preparation completed: Yes  Pain : No/denies pain     Nutritional Status: BMI > 30  Obese Nutritional Risks: None Diabetes: No  How often do you need to have someone help you when you read instructions, pamphlets, or other written materials from your doctor or pharmacy?: 1 - Never What is the last grade level you completed in school?: masters  degree  Diabetic? no  Interpreter Needed?: No  Information entered by :: NAllen LPN   Activities of Daily Living In your present state of health, do you have any difficulty performing the following activities: 04/15/2021 11/24/2020  Hearing? N N  Vision? N N  Difficulty concentrating or making decisions? N N  Walking or climbing stairs? N N  Dressing or bathing? N N  Doing errands, shopping? N N  Preparing Food and eating ? N -  Using the Toilet? N -  In the past six months, have you accidently leaked urine? Y -  Do you have problems with loss of bowel control? N -  Managing your Finances? N -  Housekeeping or managing your Housekeeping? N -  Some recent data might be hidden    Patient Care Team: Venita Lick, NP as PCP - General (Nurse Practitioner) Abbie Sons, MD (Urology) Samara Deist, DPM as Referring Physician (Podiatry)  Indicate any recent Medical Services you may have received from other than [facility] providers in the past year (date may be approximate).     Assessment:   This is a routine wellness examination for Sael.  Hearing/Vision screen Vision Screening - Comments:: Regular eye exams, Dr. Duffy Bruce  Dietary issues and exercise activities discussed: Current Exercise Habits: The patient does not participate in regular exercise at present   Goals Addressed             This Visit's Progress    Patient Stated       04/15/2021, possibly get bariatric surgery        Depression Screen PHQ 2/9 Scores 04/15/2021 11/24/2020 10/13/2020 03/24/2020 12/24/2018 09/25/2017  PHQ - 2 Score 0 0 0 0 0 0  PHQ- 9 Score - 0 - - - 1    Fall Risk Fall Risk  04/15/2021 11/24/2020 10/13/2020 03/24/2020 12/24/2018  Falls in the past year? 0 0 0 0 0  Number falls in past yr: - 0 0 0 0  Injury with Fall? - - 0 0 0  Risk for fall due to : Medication side effect - - - -  Follow up Falls evaluation completed;Education provided;Falls prevention discussed - - - -    FALL RISK  PREVENTION PERTAINING TO THE HOME:  Any stairs in or around the home? Yes  If so, are there any without handrails? No  Home free of loose throw rugs  in walkways, pet beds, electrical cords, etc? Yes  Adequate lighting in your home to reduce risk of falls? Yes   ASSISTIVE DEVICES UTILIZED TO PREVENT FALLS:  Life alert? No  Use of a cane, walker or w/c? No  Grab bars in the bathroom? Yes  Shower chair or bench in shower? Yes  Elevated toilet seat or a handicapped toilet? Yes   TIMED UP AND GO:  Was the test performed? No .      Cognitive Function:     6CIT Screen 04/15/2021  What Year? 0 points  What month? 0 points  What time? 0 points  Count back from 20 0 points  Months in reverse 0 points  Repeat phrase 0 points  Total Score 0    Immunizations Immunization History  Administered Date(s) Administered   Fluad Quad(high Dose 65+) 06/29/2020   H1N1 01/13/2009   Influenza, High Dose Seasonal PF 11/28/2018, 07/08/2019   Influenza-Unspecified 07/23/2017, 07/08/2019   Moderna Sars-Covid-2 Vaccination 12/17/2019, 01/14/2020   Pneumococcal Conjugate-13 12/24/2018   Pneumococcal Polysaccharide-23 06/29/2020   Td 12/24/2018   Zoster Recombinat (Shingrix) 07/08/2019, 11/27/2019   Zoster, Live 01/24/2013    TDAP status: Up to date  Flu Vaccine status: Up to date  Pneumococcal vaccine status: Up to date  Covid-19 vaccine status: Completed vaccines  Qualifies for Shingles Vaccine? Yes   Zostavax completed Yes   Shingrix Completed?: Yes  Screening Tests Health Maintenance  Topic Date Due   COVID-19 Vaccine (3 - Booster for Moderna series) 06/15/2020   INFLUENZA VACCINE  05/23/2021   COLONOSCOPY (Pts 45-16yrs Insurance coverage will need to be confirmed)  04/03/2023   TETANUS/TDAP  12/23/2028   Hepatitis C Screening  Completed   PNA vac Low Risk Adult  Completed   Zoster Vaccines- Shingrix  Completed   HPV VACCINES  Aged Out    Health Maintenance  Health  Maintenance Due  Topic Date Due   COVID-19 Vaccine (3 - Booster for Moderna series) 06/15/2020    Colorectal cancer screening: Type of screening: Colonoscopy. Completed 04/02/2018. Repeat every 5 years  Lung Cancer Screening: (Low Dose CT Chest recommended if Age 34-80 years, 30 pack-year currently smoking OR have quit w/in 15years.) does not qualify.   Lung Cancer Screening Referral: no  Additional Screening:  Hepatitis C Screening: does qualify; Completed 12/24/2018  Vision Screening: Recommended annual ophthalmology exams for early detection of glaucoma and other disorders of the eye. Is the patient up to date with their annual eye exam?  Yes  Who is the provider or what is the name of the office in which the patient attends annual eye exams? Dr. Duffy Bruce If pt is not established with a provider, would they like to be referred to a provider to establish care? No .   Dental Screening: Recommended annual dental exams for proper oral hygiene  Community Resource Referral / Chronic Care Management: CRR required this visit?  No   CCM required this visit?  No      Plan:     I have personally reviewed and noted the following in the patient's chart:   Medical and social history Use of alcohol, tobacco or illicit drugs  Current medications and supplements including opioid prescriptions. Patient is not currently taking opioid prescriptions. Functional ability and status Nutritional status Physical activity Advanced directives List of other physicians Hospitalizations, surgeries, and ER visits in previous 12 months Vitals Screenings to include cognitive, depression, and falls Referrals and appointments  In addition, I have reviewed  and discussed with patient certain preventive protocols, quality metrics, and best practice recommendations. A written personalized care plan for preventive services as well as general preventive health recommendations were provided to patient.      Kellie Simmering, LPN   [id number]   Nurse Notes:

## 2021-04-15 NOTE — Patient Instructions (Signed)
Mr. Grant Diaz , Thank you for taking time to come for your Medicare Wellness Visit. I appreciate your ongoing commitment to your health goals. Please review the following plan we discussed and let me know if I can assist you in the future.   Screening recommendations/referrals: Colonoscopy: completed 04/02/2018, due 04/03/2023 Recommended yearly ophthalmology/optometry visit for glaucoma screening and checkup Recommended yearly dental visit for hygiene and checkup  Vaccinations: Influenza vaccine: completed 06/29/2020, due 05/23/2021 Pneumococcal vaccine: completed 06/29/2020 Tdap vaccine: completed 12/24/2018, due 12/23/2028 Shingles vaccine: completed   Covid-19:  01/14/2020, 12/17/2019  Advanced directives: Please bring a copy of your POA (Power of Attorney) and/or Living Will to your next appointment.   Conditions/risks identified: none  Next appointment: Follow up in one year for your annual wellness visit.   Preventive Care 23 Years and Older, Male Preventive care refers to lifestyle choices and visits with your health care provider that can promote health and wellness. What does preventive care include? A yearly physical exam. This is also called an annual well check. Dental exams once or twice a year. Routine eye exams. Ask your health care provider how often you should have your eyes checked. Personal lifestyle choices, including: Daily care of your teeth and gums. Regular physical activity. Eating a healthy diet. Avoiding tobacco and drug use. Limiting alcohol use. Practicing safe sex. Taking low doses of aspirin every day. Taking vitamin and mineral supplements as recommended by your health care provider. What happens during an annual well check? The services and screenings done by your health care provider during your annual well check will depend on your age, overall health, lifestyle risk factors, and family history of disease. Counseling  Your health care provider may ask you  questions about your: Alcohol use. Tobacco use. Drug use. Emotional well-being. Home and relationship well-being. Sexual activity. Eating habits. History of falls. Memory and ability to understand (cognition). Work and work Statistician. Screening  You may have the following tests or measurements: Height, weight, and BMI. Blood pressure. Lipid and cholesterol levels. These may be checked every 5 years, or more frequently if you are over 54 years old. Skin check. Lung cancer screening. You may have this screening every year starting at age 4 if you have a 30-pack-year history of smoking and currently smoke or have quit within the past 15 years. Fecal occult blood test (FOBT) of the stool. You may have this test every year starting at age 56. Flexible sigmoidoscopy or colonoscopy. You may have a sigmoidoscopy every 5 years or a colonoscopy every 10 years starting at age 83. Prostate cancer screening. Recommendations will vary depending on your family history and other risks. Hepatitis C blood test. Hepatitis B blood test. Sexually transmitted disease (STD) testing. Diabetes screening. This is done by checking your blood sugar (glucose) after you have not eaten for a while (fasting). You may have this done every 1-3 years. Abdominal aortic aneurysm (AAA) screening. You may need this if you are a current or former smoker. Osteoporosis. You may be screened starting at age 62 if you are at high risk. Talk with your health care provider about your test results, treatment options, and if necessary, the need for more tests. Vaccines  Your health care provider may recommend certain vaccines, such as: Influenza vaccine. This is recommended every year. Tetanus, diphtheria, and acellular pertussis (Tdap, Td) vaccine. You may need a Td booster every 10 years. Zoster vaccine. You may need this after age 54. Pneumococcal 13-valent conjugate (PCV13) vaccine. One  dose is recommended after age  21. Pneumococcal polysaccharide (PPSV23) vaccine. One dose is recommended after age 20. Talk to your health care provider about which screenings and vaccines you need and how often you need them. This information is not intended to replace advice given to you by your health care provider. Make sure you discuss any questions you have with your health care provider. Document Released: 11/05/2015 Document Revised: 06/28/2016 Document Reviewed: 08/10/2015 Elsevier Interactive Patient Education  2017 [place] Prevention in the Home Falls can cause injuries. They can happen to people of all ages. There are many things you can do to make your home safe and to help prevent falls. What can I do on the outside of my home? Regularly fix the edges of walkways and driveways and fix any cracks. Remove anything that might make you trip as you walk through a door, such as a raised step or threshold. Trim any bushes or trees on the path to your home. Use bright outdoor lighting. Clear any walking paths of anything that might make someone trip, such as rocks or tools. Regularly check to see if handrails are loose or broken. Make sure that both sides of any steps have handrails. Any raised decks and porches should have guardrails on the edges. Have any leaves, snow, or ice cleared regularly. Use sand or salt on walking paths during winter. Clean up any spills in your garage right away. This includes oil or grease spills. What can I do in the bathroom? Use night lights. Install grab bars by the toilet and in the tub and shower. Do not use towel bars as grab bars. Use non-skid mats or decals in the tub or shower. If you need to sit down in the shower, use a plastic, non-slip stool. Keep the floor dry. Clean up any water that spills on the floor as soon as it happens. Remove soap buildup in the tub or shower regularly. Attach bath mats securely with double-sided non-slip rug tape. Do not have throw  rugs and other things on the floor that can make you trip. What can I do in the bedroom? Use night lights. Make sure that you have a light by your bed that is easy to reach. Do not use any sheets or blankets that are too big for your bed. They should not hang down onto the floor. Have a firm chair that has side arms. You can use this for support while you get dressed. Do not have throw rugs and other things on the floor that can make you trip. What can I do in the kitchen? Clean up any spills right away. Avoid walking on wet floors. Keep items that you use a lot in easy-to-reach places. If you need to reach something above you, use a strong step stool that has a grab bar. Keep electrical cords out of the way. Do not use floor polish or wax that makes floors slippery. If you must use wax, use non-skid floor wax. Do not have throw rugs and other things on the floor that can make you trip. What can I do with my stairs? Do not leave any items on the stairs. Make sure that there are handrails on both sides of the stairs and use them. Fix handrails that are broken or loose. Make sure that handrails are as long as the stairways. Check any carpeting to make sure that it is firmly attached to the stairs. Fix any carpet that is loose or worn.  Avoid having throw rugs at the top or bottom of the stairs. If you do have throw rugs, attach them to the floor with carpet tape. Make sure that you have a light switch at the top of the stairs and the bottom of the stairs. If you do not have them, ask someone to add them for you. What else can I do to help prevent falls? Wear shoes that: Do not have high heels. Have rubber bottoms. Are comfortable and fit you well. Are closed at the toe. Do not wear sandals. If you use a stepladder: Make sure that it is fully opened. Do not climb a closed stepladder. Make sure that both sides of the stepladder are locked into place. Ask someone to hold it for you, if  possible. Clearly mark and make sure that you can see: Any grab bars or handrails. First and last steps. Where the edge of each step is. Use tools that help you move around (mobility aids) if they are needed. These include: Canes. Walkers. Scooters. Crutches. Turn on the lights when you go into a dark area. Replace any light bulbs as soon as they burn out. Set up your furniture so you have a clear path. Avoid moving your furniture around. If any of your floors are uneven, fix them. If there are any pets around you, be aware of where they are. Review your medicines with your doctor. Some medicines can make you feel dizzy. This can increase your chance of falling. Ask your doctor what other things that you can do to help prevent falls. This information is not intended to replace advice given to you by your health care provider. Make sure you discuss any questions you have with your health care provider. Document Released: 08/05/2009 Document Revised: 03/16/2016 Document Reviewed: 11/13/2014 Elsevier Interactive Patient Education  2017 [employer].

## 2021-05-17 NOTE — Progress Notes (Signed)
Sent message, via epic in basket, requesting orders in epic from surgeon.  

## 2021-05-23 ENCOUNTER — Ambulatory Visit: Payer: Self-pay | Admitting: General Surgery

## 2021-05-23 NOTE — Patient Instructions (Signed)
DUE TO COVID-19 ONLY ONE VISITOR IS ALLOWED TO COME WITH YOU AND STAY IN THE WAITING ROOM ONLY DURING PRE OP AND PROCEDURE DAY OF SURGERY. THE 2 VISITORS  MAY VISIT WITH YOU AFTER SURGERY IN YOUR PRIVATE ROOM DURING VISITING HOURS ONLY!  YOU NEED TO HAVE A COVID 19 TEST ON_8/5______ '@__706'$  [place]   8:00 -3:00___, THIS TEST MUST BE DONE BEFORE SURGERY,                 Grant Diaz    Your procedure is scheduled on: 05/31/21   Report to [facility] Main  Entrance   Report to admitting at 12:00 pm     Call this number if you have problems the morning of surgery [place], [place].   MORNING OF SURGERY DRINK:   DRINK 1 G2 drink BEFORE YOU LEAVE HOME, DRINK ALL OF THE  G2 DRINK AT ONE TIME.     NO SOLID FOOD AFTER 6:00 PM THE NIGHT BEFORE YOUR SURGERY.   YOU MAY DRINK CLEAR FLUIDS. Until 11:00 am    CLEAR LIQUID DIET   Foods Allowed                                                                     Foods Excluded  Coffee and tea, regular and decaf                             liquids that you cannot  Plain Jell-O any favor except red or purple                                           see through such as: Fruit ices (not with fruit pulp)                                     milk, soups, orange juice  Iced Popsicles                                    All solid food Carbonated beverages, regular and diet                                    Cranberry, grape and apple juices Sports drinks like Gatorade Lightly seasoned clear broth or consume(fat free) Sugar, honey syrup    PAIN IS EXPECTED AFTER SURGERY AND WILL NOT BE COMPLETELY ELIMINATED.   AMBULATION AND TYLENOL WILL HELP REDUCE INCISIONAL AND GAS PAIN. MOVEMENT IS KEY!  YOU ARE EXPECTED TO BE OUT OF BED WITHIN 4 HOURS OF ADMISSION TO YOUR PATIENT ROOM.  SITTING IN THE RECLINER THROUGHOUT THE DAY IS IMPORTANT FOR  DRINKING FLUIDS AND MOVING GAS THROUGHOUT THE GI TRACT.  [place]  STAY UNLESS YOU ARE WALKING.   INCENTIVE SPIROMETER SHOULD BE USED EVERY HOUR WHILE AWAKE TO DECREASE POST-OPERATIVE COMPLICATIONS SUCH AS PNEUMONIA.  WHEN DISCHARGED HOME, IT IS IMPORTANT TO CONTINUE TO WALK EVERY HOUR AND USE THE INCENTIVE SPIROMETER EVERY HOUR.         [place] - Preparing for Surgery Before surgery, you can play an important role.  Because skin is not sterile, your skin needs to be as free of germs as possible.  You can reduce the number of germs on your skin by washing with CHG (chlorahexidine gluconate) soap before surgery.  CHG is an antiseptic cleaner which kills germs and bonds with the skin to continue killing germs even after washing. Please DO NOT use if you have an allergy to CHG or antibacterial soaps.  If your skin becomes reddened/irritated stop using the CHG and inform your nurse when you arrive at Short Stay.   You may shave your face/neck.  Please follow these instructions carefully:  1.  Shower with CHG Soap the night before surgery and the  morning of Surgery.  2.  If you choose to wash your hair, wash your hair first as usual with your  normal  shampoo.  3.  After you shampoo, rinse your hair and body thoroughly to remove the  shampoo.                                        4.  Use CHG as you would any other liquid soap.  You can apply chg directly  to the skin and wash                       Gently with a scrungie or clean washcloth.  5.  Apply the CHG Soap to your body ONLY FROM THE NECK DOWN.   Do not use on face/ open                           Wound or open sores. Avoid contact with eyes, ears mouth and genitals (private parts).                       Wash face,  Genitals (private parts) with your normal soap.             6.  Wash thoroughly, paying special attention to the area where your surgery  will be performed.  7.   Thoroughly rinse your body with warm water from the neck down.  8.  DO NOT shower/wash with your normal soap after using and rinsing off  the CHG Soap.             9.  Pat yourself dry with a clean towel.            10.  Wear clean pajamas.            11.  Place clean sheets on your bed the night of your first shower and do not  sleep with pets. Day of Surgery : Do not apply any lotions/deodorants the morning of surgery.  Please wear clean clothes to the hospital/surgery center.  FAILURE TO FOLLOW THESE INSTRUCTIONS MAY RESULT IN THE CANCELLATION OF YOUR SURGERY PATIENT SIGNATURE_________________________________  NURSE SIGNATURE__________________________________  ________________________________________________________________________   Grant Diaz  An incentive spirometer is a tool that can  help keep your lungs clear and active. This tool measures how well you are filling your lungs with each breath. Taking long deep breaths may help reverse or decrease the chance of developing breathing (pulmonary) problems (especially infection) following: A long period of time when you are unable to move or be active. BEFORE THE PROCEDURE  If the spirometer includes an indicator to show your best effort, your nurse or respiratory therapist will set it to a desired goal. If possible, sit up straight or lean slightly forward. Try not to slouch. Hold the incentive spirometer in an upright position. INSTRUCTIONS FOR USE  Sit on the edge of your bed if possible, or sit up as far as you can in bed or on a chair. Hold the incentive spirometer in an upright position. Breathe out normally. Place the mouthpiece in your mouth and seal your lips tightly around it. Breathe in slowly and as deeply as possible, raising the piston or the ball toward the top of the column. Hold your breath for 3-5 seconds or for as long as possible. Allow the piston or ball to fall to the bottom of the column. Remove the  mouthpiece from your mouth and breathe out normally. Rest for a few seconds and repeat Steps 1 through 7 at least 10 times every 1-2 hours when you are awake. Take your time and take a few normal breaths between deep breaths. The spirometer may include an indicator to show your best effort. Use the indicator as a goal to work toward during each repetition. After each set of 10 deep breaths, practice coughing to be sure your lungs are clear. If you have an incision (the cut made at the time of surgery), support your incision when coughing by placing a pillow or rolled up towels firmly against it. Once you are able to get out of bed, walk around indoors and cough well. You may stop using the incentive spirometer when instructed by your caregiver.  RISKS AND COMPLICATIONS Take your time so you do not get dizzy or light-headed. If you are in pain, you may need to take or ask for pain medication before doing incentive spirometry. It is harder to take a deep breath if you are having pain. AFTER USE Rest and breathe slowly and easily. It can be helpful to keep track of a log of your progress. Your caregiver can provide you with a simple table to help with this. If you are using the spirometer at home, follow these instructions: [place] IF:  You are having difficultly using the spirometer. You have trouble using the spirometer as often as instructed. Your pain medication is not giving enough relief while using the spirometer. You develop fever of 100.5 F (38.1 C) or higher. SEEK IMMEDIATE MEDICAL CARE IF:  You cough up bloody sputum that had not been present before. You develop fever of 102 F (38.9 C) or greater. You develop worsening pain at or near the incision site. MAKE SURE YOU:  Understand these instructions. Will watch your condition. Will get help right away if you are not doing well or get worse. Document Released: 02/19/2007 Document Revised: 01/01/2012 Document Reviewed:  04/22/2007 [facility] Patient Information 2014 [employer], [place].   ________________________________________________________________________   Take these medicines the morning of surgery with A SIP OF WATER:   DO NOT TAKE ANY DIABETIC MEDICATIONS DAY OF YOUR SURGERY  You may not have any metal on your body including hair pins and              piercings  Do not wear jewelry, make-up, lotions, powders or perfumes, deodorant             Do not wear nail polish on your fingernails.  Do not shave  48 hours prior to surgery.              Men may shave face and neck.   Do not bring valuables to the hospital. [place].  Contacts, dentures or bridgework may not be worn into surgery.  Leave suitcase in the car. After surgery it may be brought to your room.     Patients discharged the day of surgery will not be allowed to drive home. IF YOU ARE HAVING SURGERY AND GOING HOME THE SAME DAY, YOU MUST HAVE AN ADULT TO DRIVE YOU HOME AND BE WITH YOU FOR 24 HOURS. YOU MAY GO HOME BY TAXI OR UBER OR ORTHERWISE, BUT AN ADULT MUST ACCOMPANY YOU HOME AND STAY WITH YOU FOR 24 HOURS.  Name and phone number of your driver:  Special Instructions: N/A              Please read over the following fact sheets you were given: _____________________________________________________________________

## 2021-05-24 ENCOUNTER — Ambulatory Visit: Payer: Medicare PPO | Admitting: Nurse Practitioner

## 2021-05-24 ENCOUNTER — Encounter (HOSPITAL_COMMUNITY)
Admission: RE | Admit: 2021-05-24 | Discharge: 2021-05-24 | Disposition: A | Discharge status: Home / Self Care | Payer: Medicare PPO | Source: Ambulatory Visit | Attending: Nurse Practitioner | Admitting: Nurse Practitioner

## 2021-05-25 ENCOUNTER — Ambulatory Visit: Payer: Medicare PPO | Admitting: Gastroenterology

## 2021-05-25 NOTE — Patient Instructions (Addendum)
DUE TO COVID-19 ONLY ONE VISITOR IS ALLOWED TO COME WITH YOU AND STAY IN THE WAITING ROOM ONLY DURING PRE OP AND PROCEDURE.   **NO VISITORS ARE ALLOWED IN THE SHORT STAY AREA OR RECOVERY ROOM!!**  IF YOU WILL BE ADMITTED INTO THE HOSPITAL YOU ARE ALLOWED ONLY TWO SUPPORT PEOPLE DURING VISITATION HOURS ONLY (10AM -8PM)   The support person(s) may change daily. The support person(s) must pass our screening, gel in and out, and wear a mask at all times, including in the patient's room. Patients must also wear a mask when staff or their support person are in the room.  No visitors under the age of 40. Any visitor under the age of 32 must be accompanied by an adult.    COVID SWAB TESTING MUST BE COMPLETED ON:  Friday, 05-27-21 between the hours of 8 and 3  **MUST PRESENT COMPLETED FORM AT TESTING SITE**    [address] (backside of the building)  You are not required to quarantine, however you are required to wear a well-fitted mask when you are out and around people not in your household.  Hand Hygiene often Do NOT share personal items Notify your provider if you are in close contact with someone who has COVID or you develop fever 100.4 or greater, new onset of sneezing, cough, sore throat, shortness of breath or body aches.         Your procedure is scheduled on:  Tuesday, 05-31-21   Report to [facility] Main  Entrance    Report to admitting at 12:00 PM   Call this number if you have problems the morning of surgery [phone]   Do not eat food :After Midnight.   May have liquids until 11:00 AM  day of surgery  CLEAR LIQUID DIET  Foods Allowed                                                                     Foods Excluded  Water, Black Coffee and tea, regular and decaf              liquids that you cannot  Plain Jell-O in any flavor  (No red)                                    see through such as: Fruit ices (not with fruit pulp)                                       milk, soups, orange juice              Iced Popsicles (No red)                                      All solid food  Apple juices Sports drinks like Gatorade (No red) Lightly seasoned clear broth or consume(fat free) Sugar, honey syrup        Oral Hygiene is also important to reduce your risk of infection.                                    Remember - BRUSH YOUR TEETH THE MORNING OF SURGERY WITH YOUR REGULAR TOOTHPASTE   Do NOT smoke after Midnight   Take these medicines the morning of surgery with A SIP OF WATER:  Amlodipine, Omeprazole                                 You may not have any metal on your body including jewelry, and body piercing             Do not wear  lotions, powders, cologne, or deodorant              Men may shave face and neck.   Do not bring valuables to the hospital. [place].   Contacts, dentures or bridgework may not be worn into surgery.   Bring small overnight bag day of surgery.   Special Instructions: Bring a copy of your healthcare power of attorney and living will documents         the day of surgery if you haven't scanned them in before.   Please read over the following fact sheets you were given: IF YOU HAVE QUESTIONS ABOUT YOUR PRE OP INSTRUCTIONS PLEASE CALL [phone]   [place] - Preparing for Surgery Before surgery, you can play an important role.  Because skin is not sterile, your skin needs to be as free of germs as possible.  You can reduce the number of germs on your skin by washing with CHG (chlorahexidine gluconate) soap before surgery.  CHG is an antiseptic cleaner which kills germs and bonds with the skin to continue killing germs even after washing. Please DO NOT use if you have an allergy to CHG or antibacterial soaps.  If your skin becomes reddened/irritated stop using the CHG and inform your nurse when you arrive at Short  Stay. Do not shave (including legs and underarms) for at least 48 hours prior to the first CHG shower.  You may shave your face/neck.  Please follow these instructions carefully:  1.  Shower with CHG Soap the night before surgery and the  morning of surgery.  2.  If you choose to wash your hair, wash your hair first as usual with your normal  shampoo.  3.  After you shampoo, rinse your hair and body thoroughly to remove the shampoo.                             4.  Use CHG as you would any other liquid soap.  You can apply chg directly to the skin and wash.  Gently with a scrungie or clean washcloth.  5.  Apply the CHG Soap to your body ONLY FROM THE NECK DOWN.   Do   not use on face/ open                           Wound or open sores.  Avoid contact with eyes, ears mouth and   genitals (private parts).                       Wash face,  Genitals (private parts) with your normal soap.             6.  Wash thoroughly, paying special attention to the area where your    surgery  will be performed.  7.  Thoroughly rinse your body with warm water from the neck down.  8.  DO NOT shower/wash with your normal soap after using and rinsing off the CHG Soap.                9.  Pat yourself dry with a clean towel.            10.  Wear clean pajamas.            11.  Place clean sheets on your bed the night of your first shower and do not  sleep with pets. Day of Surgery : Do not apply any lotions/deodorants the morning of surgery.  Please wear clean clothes to the hospital/surgery center.  FAILURE TO FOLLOW THESE INSTRUCTIONS MAY RESULT IN THE CANCELLATION OF YOUR SURGERY  PATIENT SIGNATURE_________________________________  NURSE SIGNATURE__________________________________  ________________________________________________________________________

## 2021-05-26 ENCOUNTER — Encounter (HOSPITAL_COMMUNITY): Payer: Self-pay

## 2021-05-26 ENCOUNTER — Other Ambulatory Visit: Payer: Self-pay

## 2021-05-26 ENCOUNTER — Encounter (HOSPITAL_COMMUNITY)
Admission: RE | Admit: 2021-05-26 | Discharge: 2021-05-26 | Disposition: A | Discharge status: Home / Self Care | Payer: Medicare PPO | Source: Ambulatory Visit | Attending: General Surgery | Admitting: General Surgery

## 2021-05-26 DIAGNOSIS — Z7901 Long term (current) use of anticoagulants: Secondary | ICD-10-CM | POA: Diagnosis not present

## 2021-05-26 DIAGNOSIS — Z86711 Personal history of pulmonary embolism: Secondary | ICD-10-CM | POA: Insufficient documentation

## 2021-05-26 DIAGNOSIS — K219 Gastro-esophageal reflux disease without esophagitis: Secondary | ICD-10-CM | POA: Diagnosis not present

## 2021-05-26 DIAGNOSIS — Z79899 Other long term (current) drug therapy: Secondary | ICD-10-CM | POA: Diagnosis not present

## 2021-05-26 DIAGNOSIS — G4730 Sleep apnea, unspecified: Secondary | ICD-10-CM | POA: Diagnosis not present

## 2021-05-26 DIAGNOSIS — Z01818 Encounter for other preprocedural examination: Secondary | ICD-10-CM | POA: Insufficient documentation

## 2021-05-26 DIAGNOSIS — I10 Essential (primary) hypertension: Secondary | ICD-10-CM | POA: Insufficient documentation

## 2021-05-26 DIAGNOSIS — K449 Diaphragmatic hernia without obstruction or gangrene: Secondary | ICD-10-CM | POA: Insufficient documentation

## 2021-05-26 DIAGNOSIS — R1310 Dysphagia, unspecified: Secondary | ICD-10-CM | POA: Insufficient documentation

## 2021-05-26 HISTORY — DX: Personal history of urinary calculi: Z87.442

## 2021-05-26 HISTORY — DX: Cardiac arrhythmia, unspecified: I49.9

## 2021-05-26 HISTORY — DX: Personal history of other diseases of the digestive system: Z87.19

## 2021-05-26 LAB — CBC
HCT: 39.3 % (ref 39.0–52.0)
Hemoglobin: 13.2 g/dL (ref 13.0–17.0)
MCH: 30.7 pg (ref 26.0–34.0)
MCHC: 33.6 g/dL (ref 30.0–36.0)
MCV: 91.4 fL (ref 80.0–100.0)
Platelets: 184 10*3/uL (ref 150–400)
RBC: 4.3 MIL/uL (ref 4.22–5.81)
RDW: 13.3 % (ref 11.5–15.5)
WBC: 5.4 10*3/uL (ref 4.0–10.5)
nRBC: 0 % (ref 0.0–0.2)

## 2021-05-26 LAB — BASIC METABOLIC PANEL
Anion gap: 8 (ref 5–15)
BUN: 23 mg/dL (ref 8–23)
CO2: 27 mmol/L (ref 22–32)
Calcium: 9.4 mg/dL (ref 8.9–10.3)
Chloride: 105 mmol/L (ref 98–111)
Creatinine, Ser: 0.89 mg/dL (ref 0.61–1.24)
GFR, Estimated: >60 mL/min (ref >60)
Glucose, Bld: 118 mg/dL — ABNORMAL HIGH (ref 70–99)
Potassium: 4 mmol/L (ref 3.5–5.1)
Sodium: 140 mmol/L (ref 135–145)

## 2021-05-26 NOTE — Progress Notes (Addendum)
PCP - Azalee Course Cardiologist - Saw Dr.  Lamonte Sakai for clearance in [place] Requested 05-26-21 [phone]  PPM/ICD -  Device Orders -  Rep Notified -   Chest x-ray -  EKG - req. On chart Stress Test - req. On chart ECHO - req.  On chart Cardiac Cath -   Sleep Study -  CPAP -   Fasting Blood Sugar -  Checks Blood Sugar _____ times a day  Blood Thinner Instructions:Xarelto stop 3 days prior Aspirin Instructions:  ERAS Protcol - PRE-SURGERY Ensure or G2-   COVID TEST- 05-27-21  Activity--Able to walk a flight of stairs without SOB Anesthesia review: HTN, Afib  Patient denies shortness of breath, fever, cough and chest pain at PAT appointment   All instructions explained to the patient, with a verbal understanding of the material. Patient agrees to go over the instructions while at home for a better understanding. Patient also instructed to self quarantine after being tested for COVID-19. The opportunity to ask questions was provided.

## 2021-05-27 ENCOUNTER — Other Ambulatory Visit: Payer: Self-pay | Admitting: General Surgery

## 2021-05-27 NOTE — Progress Notes (Signed)
Anesthesia Chart Review   Case: [id number] Date/Time: 05/31/21 1345   Procedure: LAPAROSCOPIC REPAIR OF HIATAL HERNIA WITH LAP BAND REMOVAL   Anesthesia type: General   Pre-op diagnosis: recurrent hiatal hernia dysphagia   Location: [facility] ROOM 01 / [facility]   Surgeons: Kinsinger, Arta Bruce, MD       DISCUSSION:70 y.o. never smoker with h/o HTN, GERD, sleep apnea, PE, a-fib, recurrent hiatal hernia scheduled for above procedure 05/31/2021 with Dr. Gurney Maxin.   Seen by cardiology 05/03/2021 for preoperative evaluation.  Per OV note, "Patient cleared for upcoming surgery. Low risk. Stop Xarelto 3 days prior to surgery."  Anticipate pt can proceed with planned procedure barring acute status change.   VS: BP 125/73   Pulse 69   Temp 36.9 C (Oral)   Resp 16   Ht '5\' 8"'$  (1.727 m)   Wt 113.9 kg   SpO2 99%   BMI 38.16 kg/m   PROVIDERS: Venita Lick, NP is PCP    LABS: Labs reviewed: Acceptable for surgery. (all labs ordered are listed, but only abnormal results are displayed)  Labs Reviewed  BASIC METABOLIC PANEL - Abnormal; Notable for the following components:      Result Value   Glucose, Bld 118 (*)    All other components within normal limits  CBC     IMAGES:   EKG:   CV: Echo 04/29/2021  Ejection fraction 48 Left ventricle-Normal size, mildly decreased function Left ventricle diastolic dysfunction grade-Grade 1 relaxation abnormality Mild hypokinesis left ventricular apex Right ventricle-mildly dilated Normal right ventricular wall motion Left atrium-mildly dilated Right atrium-moderately dilated Aortic valve-Mild calcification of aortic valve cusps with No stenosis, Mild regurgitation Pulmonic valve-no stenosis, no regurgitation Mitral valve-Mild regurgitation Tricuspid valve-mild regurgitation No pericardial effusion  Echo 02/15/2018 Study Conclusions   - Left ventricle: The cavity size was normal. Systolic function was    normal. The estimated  ejection fraction was in the range of 55%    to 60%. Wall motion was normal; there were no regional wall    motion abnormalities. Features are consistent with a pseudonormal    left ventricular filling pattern, with concomitant abnormal    relaxation and increased filling pressure (grade 2 diastolic    dysfunction).  - Aortic valve: There was mild regurgitation.  - Mitral valve: There was mild regurgitation.  - Left atrium: The atrium was moderately dilated.  - Right ventricle: Systolic function was normal.  - Pulmonary arteries: Systolic pressure was within the normal    range.  Past Medical History:  Diagnosis Date   Arthritis    Barrett's esophagus    Cancer ([place]) 2008   Prostate-radiation   Cataract 2019   not yet significant enough to warrant surgery   Dysrhythmia    Afib   GERD (gastroesophageal reflux disease)    History of DVT (deep vein thrombosis)    History of hiatal hernia    History of kidney stones    Hypertension    Joint pain    Mixed hyperlipidemia    Pancreatitis 2013   [facility]   Prostate cancer [facility])    Pulmonary embolus ([place]) 12/2014   Bilateral pulmonary embolus, [facility]   Sleep apnea 2010   use CPAP regularly   Sleep apnea, obstructive    cpap    Past Surgical History:  Procedure Laterality Date   CARPAL TUNNEL RELEASE Right 03/15/2017   Procedure: CARPAL TUNNEL RELEASE;  Surgeon: Hessie Knows, MD;  Location: [facility] ORS;  Service: Orthopedics;  Laterality: Right;   CHOLECYSTECTOMY     COLONOSCOPY WITH PROPOFOL N/A 04/02/2018   Procedure: COLONOSCOPY WITH PROPOFOL;  Surgeon: Lucilla Lame, MD;  Location: [facility] ENDOSCOPY;  Service: Endoscopy;  Laterality: N/A;   ESOPHAGOGASTRODUODENOSCOPY N/A 05/28/2013   Procedure: ESOPHAGOGASTRODUODENOSCOPY (EGD);  Surgeon: Shann Medal, MD;  Location: [facility] ENDOSCOPY;  Service: General;  Laterality: N/A;   ESOPHAGOGASTRODUODENOSCOPY (EGD) WITH PROPOFOL N/A 02/08/2021   Procedure: ESOPHAGOGASTRODUODENOSCOPY (EGD) WITH  PROPOFOL;  Surgeon: Virgel Manifold, MD;  Location: [facility] ENDOSCOPY;  Service: Endoscopy;  Laterality: N/A;   GALLBLADDER SURGERY  2003   GANGLION CYST EXCISION Right 03/15/2017   Procedure: REMOVAL GANGLION OF WRIST;  Surgeon: Hessie Knows, MD;  Location: [facility] ORS;  Service: Orthopedics;  Laterality: Right;   HERNIA REPAIR     JOINT REPLACEMENT     KNEE ARTHROSCOPY Bilateral [id number]   LAPAROSCOPIC GASTRIC BANDING  2009   and hernia repair   RADIATION IMPLANT W/ ULTRASOUND, MALE  2005   internal/external radiation for prostate cancer   TOTAL KNEE ARTHROPLASTY Left 10/12/2015   Procedure: TOTAL KNEE ARTHROPLASTY;  Surgeon: Hessie Knows, MD;  Location: [facility] ORS;  Service: Orthopedics;  Laterality: Left;   TOTAL KNEE ARTHROPLASTY Right 05/15/2017   Procedure: TOTAL KNEE ARTHROPLASTY;  Surgeon: Hessie Knows, MD;  Location: [facility] ORS;  Service: Orthopedics;  Laterality: Right;   UMBILICAL HERNIA REPAIR  2010    MEDICATIONS:  acetaminophen (TYLENOL) 325 MG tablet   amLODipine (NORVASC) 10 MG tablet   celecoxib (CELEBREX) 200 MG capsule   Cholecalciferol 25 MCG (1000 UT) capsule   lisinopril-hydrochlorothiazide (ZESTORETIC) 20-25 MG tablet   Multiple Vitamin (MULTIVITAMIN) capsule   neomycin-polymyxin-hydrocortisone (CORTISPORIN) OTIC solution   omeprazole (PRILOSEC) 20 MG capsule   rosuvastatin (CRESTOR) 10 MG tablet   triamcinolone (KENALOG) 0.025 % cream   XARELTO 20 MG TABS tablet   ketoconazole (NIZORAL) 2 % cream   No current facility-administered medications for this encounter.   Konrad Felix, PA-C [facility] Pre-Surgical Testing [phone]

## 2021-05-30 LAB — SARS CORONAVIRUS 2 (TAT 6-24 HRS): SARS Coronavirus 2: NEGATIVE

## 2021-05-30 MED ORDER — BUPIVACAINE LIPOSOME 1.3 % IJ SUSP
20.0000 mL | Freq: Once | INTRAMUSCULAR | Status: DC
  Filled 2021-05-30: qty 20

## 2021-05-31 ENCOUNTER — Observation Stay (HOSPITAL_COMMUNITY)
Admission: RE | Admit: 2021-05-31 | Discharge: 2021-06-01 | Disposition: A | Discharge status: Home / Self Care | Payer: Medicare PPO | Attending: General Surgery | Admitting: General Surgery

## 2021-05-31 ENCOUNTER — Ambulatory Visit (HOSPITAL_COMMUNITY): Payer: Medicare PPO | Admitting: Certified Registered"

## 2021-05-31 ENCOUNTER — Ambulatory Visit (HOSPITAL_COMMUNITY): Payer: Medicare PPO | Admitting: Physician Assistant

## 2021-05-31 ENCOUNTER — Encounter (HOSPITAL_COMMUNITY): Payer: Self-pay | Admitting: General Surgery

## 2021-05-31 ENCOUNTER — Other Ambulatory Visit: Payer: Self-pay

## 2021-05-31 ENCOUNTER — Encounter (HOSPITAL_COMMUNITY)
Admission: RE | Disposition: A | Discharge status: Home / Self Care | Payer: Self-pay | Source: Home / Self Care | Attending: General Surgery

## 2021-05-31 DIAGNOSIS — Z96653 Presence of artificial knee joint, bilateral: Secondary | ICD-10-CM | POA: Insufficient documentation

## 2021-05-31 DIAGNOSIS — R1310 Dysphagia, unspecified: Secondary | ICD-10-CM

## 2021-05-31 DIAGNOSIS — K449 Diaphragmatic hernia without obstruction or gangrene: Secondary | ICD-10-CM | POA: Insufficient documentation

## 2021-05-31 DIAGNOSIS — K9509 Other complications of gastric band procedure: Secondary | ICD-10-CM | POA: Diagnosis not present

## 2021-05-31 DIAGNOSIS — Z7901 Long term (current) use of anticoagulants: Secondary | ICD-10-CM | POA: Insufficient documentation

## 2021-05-31 DIAGNOSIS — Z8546 Personal history of malignant neoplasm of prostate: Secondary | ICD-10-CM | POA: Insufficient documentation

## 2021-05-31 DIAGNOSIS — K219 Gastro-esophageal reflux disease without esophagitis: Secondary | ICD-10-CM | POA: Diagnosis present

## 2021-05-31 DIAGNOSIS — Z79899 Other long term (current) drug therapy: Secondary | ICD-10-CM | POA: Insufficient documentation

## 2021-05-31 DIAGNOSIS — I10 Essential (primary) hypertension: Secondary | ICD-10-CM | POA: Insufficient documentation

## 2021-05-31 HISTORY — PX: HIATAL HERNIA REPAIR: SHX195

## 2021-05-31 HISTORY — PX: OTHER SURGICAL HISTORY: SHX169

## 2021-05-31 LAB — HEMOGLOBIN AND HEMATOCRIT, BLOOD
HCT: 37.7 % — ABNORMAL LOW (ref 39.0–52.0)
Hemoglobin: 12.7 g/dL — ABNORMAL LOW (ref 13.0–17.0)

## 2021-05-31 SURGERY — REPAIR, HERNIA, HIATAL, LAPAROSCOPIC
Anesthesia: General | Site: Abdomen

## 2021-05-31 MED ORDER — HYDRALAZINE HCL 10 MG PO TABS
10.0000 mg | ORAL_TABLET | Freq: Three times a day (TID) | ORAL | Status: DC | PRN
  Filled 2021-05-31: qty 1

## 2021-05-31 MED ORDER — LISINOPRIL-HYDROCHLOROTHIAZIDE 20-25 MG PO TABS: 1.0000 | ORAL_TABLET | Freq: Every day | ORAL | Status: DC

## 2021-05-31 MED ORDER — DEXTROSE-NACL 5-0.45 % IV SOLN: INTRAVENOUS | Status: DC

## 2021-05-31 MED ORDER — SUGAMMADEX SODIUM 200 MG/2ML IV SOLN
INTRAVENOUS | Status: DC | PRN
  Administered 2021-05-31: 200 mg via INTRAVENOUS

## 2021-05-31 MED ORDER — CHLORHEXIDINE GLUCONATE 0.12 % MT SOLN
15.0000 mL | Freq: Once | OROMUCOSAL | Status: AC
  Administered 2021-05-31: 15 mL via OROMUCOSAL

## 2021-05-31 MED ORDER — AMISULPRIDE (ANTIEMETIC) 5 MG/2ML IV SOLN
INTRAVENOUS | Status: AC
  Filled 2021-05-31: qty 4

## 2021-05-31 MED ORDER — ENOXAPARIN SODIUM 30 MG/0.3ML IJ SOSY
30.0000 mg | PREFILLED_SYRINGE | Freq: Two times a day (BID) | INTRAMUSCULAR | Status: DC
  Administered 2021-05-31 – 2021-06-01 (×2): 30 mg via SUBCUTANEOUS
  Filled 2021-05-31 (×2): qty 0.3

## 2021-05-31 MED ORDER — ENSURE MAX PROTEIN PO LIQD
2.0000 [oz_av] | ORAL | Status: DC
  Administered 2021-06-01 (×6): 2 [oz_av] via ORAL

## 2021-05-31 MED ORDER — ROCURONIUM BROMIDE 100 MG/10ML IV SOLN
INTRAVENOUS | Status: DC | PRN
  Administered 2021-05-31 (×2): 10 mg via INTRAVENOUS
  Administered 2021-05-31: 50 mg via INTRAVENOUS

## 2021-05-31 MED ORDER — FENTANYL CITRATE (PF) 250 MCG/5ML IJ SOLN
INTRAMUSCULAR | Status: AC
  Filled 2021-05-31: qty 5

## 2021-05-31 MED ORDER — KETAMINE HCL 10 MG/ML IJ SOLN
INTRAMUSCULAR | Status: DC | PRN
  Administered 2021-05-31 (×4): 10 mg via INTRAVENOUS

## 2021-05-31 MED ORDER — AMISULPRIDE (ANTIEMETIC) 5 MG/2ML IV SOLN
10.0000 mg | Freq: Once | INTRAVENOUS | Status: AC | PRN
  Administered 2021-05-31: 10 mg via INTRAVENOUS

## 2021-05-31 MED ORDER — BUPIVACAINE LIPOSOME 1.3 % IJ SUSP
INTRAMUSCULAR | Status: DC | PRN
  Administered 2021-05-31: 20 mL

## 2021-05-31 MED ORDER — ONDANSETRON HCL 4 MG/2ML IJ SOLN
4.0000 mg | INTRAMUSCULAR | Status: DC | PRN
  Administered 2021-05-31: 4 mg via INTRAVENOUS
  Filled 2021-05-31: qty 2

## 2021-05-31 MED ORDER — LACTATED RINGERS IV SOLN: INTRAVENOUS | Status: DC

## 2021-05-31 MED ORDER — ACETAMINOPHEN 500 MG PO TABS
1000.0000 mg | ORAL_TABLET | Freq: Three times a day (TID) | ORAL | Status: DC
  Administered 2021-05-31 – 2021-06-01 (×2): 1000 mg via ORAL
  Filled 2021-05-31 (×2): qty 2

## 2021-05-31 MED ORDER — HYDROMORPHONE HCL 2 MG/ML IJ SOLN
INTRAMUSCULAR | Status: AC
  Filled 2021-05-31: qty 1

## 2021-05-31 MED ORDER — CELECOXIB 200 MG PO CAPS
200.0000 mg | ORAL_CAPSULE | Freq: Every day | ORAL | Status: DC
  Administered 2021-05-31 – 2021-06-01 (×2): 200 mg via ORAL
  Filled 2021-05-31 (×2): qty 1

## 2021-05-31 MED ORDER — HYDROMORPHONE HCL 1 MG/ML IJ SOLN
INTRAMUSCULAR | Status: DC | PRN
  Administered 2021-05-31 (×2): 1 mg via INTRAVENOUS

## 2021-05-31 MED ORDER — BUPIVACAINE-EPINEPHRINE 0.25% -1:200000 IJ SOLN
INTRAMUSCULAR | Status: DC | PRN
  Administered 2021-05-31: 30 mL

## 2021-05-31 MED ORDER — CHLORHEXIDINE GLUCONATE CLOTH 2 % EX PADS: 6.0000 | MEDICATED_PAD | Freq: Once | CUTANEOUS | Status: DC

## 2021-05-31 MED ORDER — ORAL CARE MOUTH RINSE: 15.0000 mL | Freq: Once | OROMUCOSAL | Status: AC

## 2021-05-31 MED ORDER — LIDOCAINE 2% (20 MG/ML) 5 ML SYRINGE
INTRAMUSCULAR | Status: DC | PRN
  Administered 2021-05-31: 80 mg via INTRAVENOUS

## 2021-05-31 MED ORDER — FAMOTIDINE IN NACL 20-0.9 MG/50ML-% IV SOLN
20.0000 mg | Freq: Two times a day (BID) | INTRAVENOUS | Status: DC
  Administered 2021-05-31 – 2021-06-01 (×2): 20 mg via INTRAVENOUS
  Filled 2021-05-31 (×2): qty 50

## 2021-05-31 MED ORDER — DEXAMETHASONE SODIUM PHOSPHATE 10 MG/ML IJ SOLN
INTRAMUSCULAR | Status: AC
  Filled 2021-05-31: qty 1

## 2021-05-31 MED ORDER — MIDAZOLAM HCL 5 MG/5ML IJ SOLN
INTRAMUSCULAR | Status: DC | PRN
  Administered 2021-05-31: 2 mg via INTRAVENOUS

## 2021-05-31 MED ORDER — PROPOFOL 10 MG/ML IV BOLUS
INTRAVENOUS | Status: DC | PRN
  Administered 2021-05-31: 130 mg via INTRAVENOUS

## 2021-05-31 MED ORDER — OXYCODONE HCL 5 MG/5ML PO SOLN
5.0000 mg | Freq: Four times a day (QID) | ORAL | Status: DC | PRN
  Administered 2021-06-01: 5 mg via ORAL
  Filled 2021-05-31: qty 5

## 2021-05-31 MED ORDER — CELECOXIB 200 MG PO CAPS
400.0000 mg | ORAL_CAPSULE | ORAL | Status: AC
  Administered 2021-05-31: 400 mg via ORAL
  Filled 2021-05-31: qty 2

## 2021-05-31 MED ORDER — BUPIVACAINE-EPINEPHRINE (PF) 0.25% -1:200000 IJ SOLN
INTRAMUSCULAR | Status: AC
  Filled 2021-05-31: qty 30

## 2021-05-31 MED ORDER — FENTANYL CITRATE (PF) 100 MCG/2ML IJ SOLN
INTRAMUSCULAR | Status: DC | PRN
  Administered 2021-05-31 (×5): 50 ug via INTRAVENOUS

## 2021-05-31 MED ORDER — AMLODIPINE BESYLATE 10 MG PO TABS
10.0000 mg | ORAL_TABLET | Freq: Every day | ORAL | Status: DC
  Administered 2021-06-01: 10 mg via ORAL
  Filled 2021-05-31: qty 1

## 2021-05-31 MED ORDER — LACTATED RINGERS IR SOLN
Status: DC | PRN
  Administered 2021-05-31: 1000 mL

## 2021-05-31 MED ORDER — HYDROCHLOROTHIAZIDE 25 MG PO TABS
25.0000 mg | ORAL_TABLET | Freq: Every day | ORAL | Status: DC
  Administered 2021-05-31 – 2021-06-01 (×2): 25 mg via ORAL
  Filled 2021-05-31 (×3): qty 1

## 2021-05-31 MED ORDER — LIDOCAINE 2% (20 MG/ML) 5 ML SYRINGE
INTRAMUSCULAR | Status: AC
  Filled 2021-05-31: qty 5

## 2021-05-31 MED ORDER — ROCURONIUM BROMIDE 10 MG/ML (PF) SYRINGE
PREFILLED_SYRINGE | INTRAVENOUS | Status: AC
  Filled 2021-05-31: qty 10

## 2021-05-31 MED ORDER — CEFAZOLIN SODIUM-DEXTROSE 2-4 GM/100ML-% IV SOLN
2.0000 g | INTRAVENOUS | Status: AC
  Administered 2021-05-31: 2 g via INTRAVENOUS
  Filled 2021-05-31: qty 100

## 2021-05-31 MED ORDER — DEXAMETHASONE SODIUM PHOSPHATE 10 MG/ML IJ SOLN
INTRAMUSCULAR | Status: DC | PRN
Start: 2021-05-31 — End: 2021-05-31
  Administered 2021-05-31: 10 mg via INTRAVENOUS

## 2021-05-31 MED ORDER — PANTOPRAZOLE SODIUM 40 MG PO TBEC
40.0000 mg | DELAYED_RELEASE_TABLET | Freq: Every day | ORAL | Status: DC
  Administered 2021-06-01: 40 mg via ORAL
  Filled 2021-05-31: qty 1

## 2021-05-31 MED ORDER — PHENYLEPHRINE 40 MCG/ML (10ML) SYRINGE FOR IV PUSH (FOR BLOOD PRESSURE SUPPORT)
PREFILLED_SYRINGE | INTRAVENOUS | Status: DC | PRN
  Administered 2021-05-31: 80 ug via INTRAVENOUS

## 2021-05-31 MED ORDER — 0.9 % SODIUM CHLORIDE (POUR BTL) OPTIME
TOPICAL | Status: DC | PRN
  Administered 2021-05-31: 1000 mL

## 2021-05-31 MED ORDER — PHENYLEPHRINE 40 MCG/ML (10ML) SYRINGE FOR IV PUSH (FOR BLOOD PRESSURE SUPPORT)
PREFILLED_SYRINGE | INTRAVENOUS | Status: AC
  Filled 2021-05-31: qty 20

## 2021-05-31 MED ORDER — SIMETHICONE 80 MG PO CHEW: 80.0000 mg | CHEWABLE_TABLET | Freq: Four times a day (QID) | ORAL | Status: DC | PRN

## 2021-05-31 MED ORDER — ACETAMINOPHEN 160 MG/5ML PO SOLN: 1000.0000 mg | Freq: Three times a day (TID) | ORAL | Status: DC

## 2021-05-31 MED ORDER — MORPHINE SULFATE (PF) 4 MG/ML IV SOLN: 1.0000 mg | INTRAVENOUS | Status: DC | PRN

## 2021-05-31 MED ORDER — FENTANYL CITRATE (PF) 100 MCG/2ML IJ SOLN: 25.0000 ug | INTRAMUSCULAR | Status: DC | PRN

## 2021-05-31 MED ORDER — KETAMINE HCL 10 MG/ML IJ SOLN
INTRAMUSCULAR | Status: AC
  Filled 2021-05-31: qty 1

## 2021-05-31 MED ORDER — LISINOPRIL 20 MG PO TABS
20.0000 mg | ORAL_TABLET | Freq: Every day | ORAL | Status: DC
  Administered 2021-05-31 – 2021-06-01 (×2): 20 mg via ORAL
  Filled 2021-05-31 (×3): qty 1

## 2021-05-31 MED ORDER — MIDAZOLAM HCL 2 MG/2ML IJ SOLN
INTRAMUSCULAR | Status: AC
  Filled 2021-05-31: qty 2

## 2021-05-31 MED ORDER — ACETAMINOPHEN 500 MG PO TABS
1000.0000 mg | ORAL_TABLET | ORAL | Status: AC
  Administered 2021-05-31: 1000 mg via ORAL
  Filled 2021-05-31: qty 2

## 2021-05-31 MED ORDER — ONDANSETRON HCL 4 MG/2ML IJ SOLN
INTRAMUSCULAR | Status: DC | PRN
  Administered 2021-05-31: 4 mg via INTRAVENOUS

## 2021-05-31 SURGICAL SUPPLY — 58 items
ADH SKN CLS APL DERMABOND .7 (GAUZE/BANDAGES/DRESSINGS) ×1
APL PRP STRL LF DISP 70% ISPRP (MISCELLANEOUS) ×1
APL SKNCLS STERI-STRIP NONHPOA (GAUZE/BANDAGES/DRESSINGS) ×1
APPLIER CLIP 5 13 M/L LIGAMAX5 (MISCELLANEOUS)
APPLIER CLIP ROT 10 11.4 M/L (STAPLE)
APR CLP MED LRG 11.4X10 (STAPLE)
APR CLP MED LRG 5 ANG JAW (MISCELLANEOUS)
BAG COUNTER SPONGE SURGICOUNT (BAG) IMPLANT
BAG SPNG CNTER NS LX DISP (BAG)
BENZOIN TINCTURE PRP APPL 2/3 (GAUZE/BANDAGES/DRESSINGS) ×2 IMPLANT
BNDG ADH 1X3 SHEER STRL LF (GAUZE/BANDAGES/DRESSINGS) ×12 IMPLANT
BNDG ADH THN 3X1 STRL LF (GAUZE/BANDAGES/DRESSINGS) ×6
CHLORAPREP W/TINT 26 (MISCELLANEOUS) ×2 IMPLANT
CLIP APPLIE 5 13 M/L LIGAMAX5 (MISCELLANEOUS) IMPLANT
CLIP APPLIE ROT 10 11.4 M/L (STAPLE) IMPLANT
COVER SURGICAL LIGHT HANDLE (MISCELLANEOUS) ×2 IMPLANT
DECANTER SPIKE VIAL GLASS SM (MISCELLANEOUS) ×2 IMPLANT
DERMABOND ADVANCED (GAUZE/BANDAGES/DRESSINGS) ×1
DERMABOND ADVANCED .7 DNX12 (GAUZE/BANDAGES/DRESSINGS) IMPLANT
DRAIN CHANNEL 19F RND (DRAIN) IMPLANT
DRAIN PENROSE 0.5X18 (DRAIN) ×2 IMPLANT
ELECT L-HOOK LAP 45CM DISP (ELECTROSURGICAL)
ELECT PENCIL ROCKER SW 15FT (MISCELLANEOUS) ×1 IMPLANT
ELECT REM PT RETURN 15FT ADLT (MISCELLANEOUS) ×2 IMPLANT
ELECTRODE L-HOOK LAP 45CM DISP (ELECTROSURGICAL) IMPLANT
EVACUATOR SILICONE 100CC (DRAIN) IMPLANT
GLOVE SURG POLYISO LF SZ7 (GLOVE) ×2 IMPLANT
GLOVE SURG UNDER POLY LF SZ7 (GLOVE) ×2 IMPLANT
GOWN STRL REUS W/TWL XL LVL3 (GOWN DISPOSABLE) ×6 IMPLANT
KIT BASIN OR (CUSTOM PROCEDURE TRAY) ×2 IMPLANT
KIT TURNOVER KIT A (KITS) ×2 IMPLANT
L-HOOK LAP DISP 36CM (ELECTROSURGICAL)
LEGGING LITHOTOMY PAIR STRL (DRAPES) ×2 IMPLANT
LHOOK LAP DISP 36CM (ELECTROSURGICAL) IMPLANT
MARKER SKIN DUAL TIP RULER LAB (MISCELLANEOUS) ×2 IMPLANT
PAD POSITIONING PINK XL (MISCELLANEOUS) IMPLANT
PENCIL SMOKE EVACUATOR (MISCELLANEOUS) IMPLANT
PROTECTOR NERVE ULNAR (MISCELLANEOUS) IMPLANT
SCISSORS LAP 5X45 EPIX DISP (ENDOMECHANICALS) ×2 IMPLANT
SET IRRIG TUBING LAPAROSCOPIC (IRRIGATION / IRRIGATOR) ×2 IMPLANT
SHEARS HARMONIC ACE PLUS 45CM (MISCELLANEOUS) ×2 IMPLANT
SLEEVE XCEL OPT CAN 5 100 (ENDOMECHANICALS) ×4 IMPLANT
STRIP CLOSURE SKIN 1/2X4 (GAUZE/BANDAGES/DRESSINGS) ×2 IMPLANT
SUT ETHIBOND 0 36 GRN (SUTURE) ×6 IMPLANT
SUT ETHILON 2 0 PS N (SUTURE) IMPLANT
SUT MNCRL AB 4-0 PS2 18 (SUTURE) ×2 IMPLANT
SUT SILK 0 SH 30 (SUTURE) IMPLANT
SUT SILK 2 0 SH (SUTURE) ×12 IMPLANT
TAPE CLOTH 4X10 WHT NS (GAUZE/BANDAGES/DRESSINGS) IMPLANT
TIP INNERVISION DETACH 40FR (MISCELLANEOUS) IMPLANT
TIP INNERVISION DETACH 50FR (MISCELLANEOUS) IMPLANT
TIP INNERVISION DETACH 56FR (MISCELLANEOUS) IMPLANT
TIPS INNERVISION DETACH 40FR (MISCELLANEOUS)
TOWEL OR 17X26 10 PK STRL BLUE (TOWEL DISPOSABLE) ×2 IMPLANT
TOWEL OR NON WOVEN STRL DISP B (DISPOSABLE) IMPLANT
TRAY LAPAROSCOPIC (CUSTOM PROCEDURE TRAY) ×2 IMPLANT
TROCAR BLADELESS OPT 5 100 (ENDOMECHANICALS) ×2 IMPLANT
TROCAR XCEL 12X100 BLDLESS (ENDOMECHANICALS) ×2 IMPLANT

## 2021-05-31 NOTE — Transfer of Care (Signed)
Immediate Anesthesia Transfer of Care Note  Patient: Grant Diaz  Procedure(s) Performed: LAPAROSCOPIC REPAIR OF HIATAL HERNIA WITH LAP BAND REMOVAL (Abdomen)  Patient Location: PACU  Anesthesia Type:General  Level of Consciousness: awake, alert , oriented and patient cooperative  Airway & Oxygen Therapy: Patient Spontanous Breathing and Patient connected to face mask oxygen  Post-op Assessment: Report given to RN, Post -op Vital signs reviewed and stable and Patient moving all extremities X 4  Post vital signs: stable  Last Vitals:  Vitals Value Taken Time  BP 139/87 05/31/21 1507  Temp    Pulse 101 05/31/21 1514  Resp 17 05/31/21 1514  SpO2 100 % 05/31/21 1514  Vitals shown include unvalidated device data.  Last Pain:  Vitals:   05/31/21 1222  TempSrc: Oral  PainSc: 4          Complications: No notable events documented.

## 2021-05-31 NOTE — Anesthesia Procedure Notes (Signed)
Procedure Name: Intubation Date/Time: 05/31/2021 1:09 PM Performed by: Gwyndolyn Saxon, CRNA Pre-anesthesia Checklist: Patient identified, Emergency Drugs available, Suction available and Patient being monitored Patient Re-evaluated:Patient Re-evaluated prior to induction Oxygen Delivery Method: Circle system utilized Preoxygenation: Pre-oxygenation with 100% oxygen Induction Type: IV induction Ventilation: Mask ventilation without difficulty Laryngoscope Size: Miller and 2 Grade View: Grade I Tube type: Oral Tube size: 7.5 mm Number of attempts: 1 Airway Equipment and Method: Patient positioned with wedge pillow and Stylet Placement Confirmation: ETT inserted through vocal cords under direct vision, positive ETCO2 and breath sounds checked- equal and bilateral Secured at: 23 cm Tube secured with: Tape Dental Injury: Teeth and Oropharynx as per pre-operative assessment

## 2021-05-31 NOTE — H&P (Signed)
Grant Diaz is an 70 y.o. male.   Chief Complaint: reflux HPI: 70 yo male with lap band placed in 2009. He presents for removal due to esophageal   Past Medical History:  Diagnosis Date   Arthritis    Barrett's esophagus    Cancer ([place]) 2008   Prostate-radiation   Cataract 2019   not yet significant enough to warrant surgery   Dysrhythmia    Afib   GERD (gastroesophageal reflux disease)    History of DVT (deep vein thrombosis)    History of hiatal hernia    History of kidney stones    Hypertension    Joint pain    Mixed hyperlipidemia    Pancreatitis 2013   [facility]   Prostate cancer [facility])    Pulmonary embolus ([place]) 12/2014   Bilateral pulmonary embolus, [facility]   Sleep apnea 2010   use CPAP regularly   Sleep apnea, obstructive    cpap    Past Surgical History:  Procedure Laterality Date   CARPAL TUNNEL RELEASE Right 03/15/2017   Procedure: CARPAL TUNNEL RELEASE;  Surgeon: Hessie Knows, MD;  Location: [facility] ORS;  Service: Orthopedics;  Laterality: Right;   CHOLECYSTECTOMY     COLONOSCOPY WITH PROPOFOL N/A 04/02/2018   Procedure: COLONOSCOPY WITH PROPOFOL;  Surgeon: Lucilla Lame, MD;  Location: [facility] ENDOSCOPY;  Service: Endoscopy;  Laterality: N/A;   ESOPHAGOGASTRODUODENOSCOPY N/A 05/28/2013   Procedure: ESOPHAGOGASTRODUODENOSCOPY (EGD);  Surgeon: Shann Medal, MD;  Location: [facility] END[facility]COPY;  Service: General;  Laterality: N/A;   ESOPHAGOGASTRODUODENOSCOPY (EGD) WITH PROPOFOL N/A 02/08/2021   Procedure: ESOPHAGOGASTRODUODENOSCOPY (EGD) WITH PROPOFOL;  Surgeon: Virgel Manifold, MD;  Location: [facility] ENDOSCOPY;  Service: Endoscopy;  Laterality: N/A;   GALLBLADDER SURGERY  2003   GANGLION CYST EXCISION Right 03/15/2017   Procedure: REMOVAL GANGLION OF WRIST;  Surgeon: Hessie Knows, MD;  Location: [facility] ORS;  Service: Orthopedics;  Laterality: Right;   HERNIA REPAIR     JOINT REPLACEMENT     KNEE ARTHROSCOPY Bilateral [id number]   LAPAROSCOPIC GASTRIC BANDING  2009   and  hernia repair   RADIATION IMPLANT W/ ULTRASOUND, MALE  2005   internal/external radiation for prostate cancer   TOTAL KNEE ARTHROPLASTY Left 10/12/2015   Procedure: TOTAL KNEE ARTHROPLASTY;  Surgeon: Hessie Knows, MD;  Location: [facility] ORS;  Service: Orthopedics;  Laterality: Left;   TOTAL KNEE ARTHROPLASTY Right 05/15/2017   Procedure: TOTAL KNEE ARTHROPLASTY;  Surgeon: Hessie Knows, MD;  Location: [facility] ORS;  Service: Orthopedics;  Laterality: Right;   UMBILICAL HERNIA REPAIR  2010    Family History  Problem Relation Age of Onset   Diabetes Mother    Arthritis Mother    Obesity Mother    Parkinson's disease Father    Hypertension Brother    Arthritis Brother    Asthma Brother    Diabetes Son        type 1   Heart disease Maternal Grandmother        CHF   Stroke Maternal Grandfather    Heart disease Paternal Grandmother        CHF   Stroke Paternal Grandfather    Social History:  reports that he has never smoked. He has never used smokeless tobacco. He reports previous alcohol use. He reports that he does not use drugs.  Allergies:  Allergies  Allergen Reactions   Adhesive [Tape] Rash    "whelps", latex adhesive tape only, paper tape is ok     Medications Prior to Admission  Medication  Sig Dispense Refill   acetaminophen (TYLENOL) 325 MG tablet Take 650 mg by mouth every 6 (six) hours as needed for mild pain.     amLODipine (NORVASC) 10 MG tablet Take 1 tablet (10 mg total) by mouth daily. 90 tablet 4   celecoxib (CELEBREX) 200 MG capsule Take 1 capsule (200 mg total) by mouth daily. 90 capsule 3   Cholecalciferol 25 MCG (1000 UT) capsule Take 1,000 Units by mouth daily.     ketoconazole (NIZORAL) 2 % cream Apply 1 application topically daily as needed. 15 g 3   lisinopril-hydrochlorothiazide (ZESTORETIC) 20-25 MG tablet Take 1 tablet by mouth once daily 90 tablet 0   Multiple Vitamin (MULTIVITAMIN) capsule Take 1 capsule by mouth daily.      neomycin-polymyxin-hydrocortisone (CORTISPORIN) OTIC solution Place 3 drops into both ears 3 (three) times daily. 10 mL 2   omeprazole (PRILOSEC) 20 MG capsule Take 1 capsule (20 mg total) by mouth daily. 90 capsule 4   rosuvastatin (CRESTOR) 10 MG tablet Take one tablet (10 MG) by mouth three times a week. 36 tablet 4   triamcinolone (KENALOG) 0.025 % cream Apply 1 application topically 2 (two) times daily as needed (skin rash). 30 g 2   XARELTO 20 MG TABS tablet Take 20 mg by mouth daily.      No results found for this or any previous visit (from the past 48 hour(s)). No results found.  Review of Systems  Constitutional:  Negative for chills and fever.  HENT:  Negative for hearing loss.   Respiratory:  Negative for cough.   Cardiovascular:  Negative for chest pain and palpitations.  Gastrointestinal:  Negative for abdominal pain, nausea and vomiting.  Genitourinary:  Negative for dysuria and urgency.  Musculoskeletal:  Negative for myalgias and neck pain.  Skin:  Negative for rash.  Neurological:  Negative for dizziness and headaches.  Hematological:  Does not bruise/bleed easily.  Psychiatric/Behavioral:  Negative for suicidal ideas.    Blood pressure (!) 164/103, pulse (!) 102, temperature 98.3 F (36.8 C), temperature source Oral, resp. rate 20, height '5\' 8"'$  (1.727 m), weight 113.9 kg, SpO2 99 %. Physical Exam Vitals reviewed.  Constitutional:      Appearance: He is well-developed.  HENT:     Head: Normocephalic and atraumatic.  Eyes:     Conjunctiva/sclera: Conjunctivae normal.     Pupils: Pupils are equal, round, and reactive to light.  Cardiovascular:     Rate and Rhythm: Normal rate and regular rhythm.  Pulmonary:     Effort: Pulmonary effort is normal.     Breath sounds: Normal breath sounds.  Abdominal:     General: Bowel sounds are normal. There is no distension.     Palpations: Abdomen is soft.     Tenderness: There is no abdominal tenderness.  Musculoskeletal:         General: Normal range of motion.     Cervical back: Normal range of motion and neck supple.  Skin:    General: Skin is warm and dry.  Neurological:     Mental Status: He is alert and oriented to person, place, and time.  Psychiatric:        Behavior: Behavior normal.     Assessment/Plan 70 yo male with reflux and lap band in place -lap removal of lap band with possible hiatal hernia repair -observation stay -ERAS protocol  Mickeal Skinner, MD 05/31/2021, 12:24 PM

## 2021-05-31 NOTE — Anesthesia Postprocedure Evaluation (Signed)
Anesthesia Post Note  Patient: Grant Diaz  Procedure(s) Performed: LAPAROSCOPIC REPAIR OF HIATAL HERNIA WITH LAP BAND REMOVAL (Abdomen)     Patient location during evaluation: PACU Anesthesia Type: General Level of consciousness: awake and alert Pain management: pain level controlled Vital Signs Assessment: post-procedure vital signs reviewed and stable Respiratory status: spontaneous breathing, nonlabored ventilation, respiratory function stable and patient connected to nasal cannula oxygen Cardiovascular status: blood pressure returned to baseline and stable Postop Assessment: no apparent nausea or vomiting Anesthetic complications: no   No notable events documented.  Last Vitals:  Vitals:   05/31/21 1833 05/31/21 1952  BP: 120/69 (!) 155/91  Pulse: 75 86  Resp:  18  Temp: 36.4 C (!) 36.3 C  SpO2: 99% 100%    Last Pain:  Vitals:   05/31/21 2032  TempSrc:   PainSc: 4                  Tiajuana Amass

## 2021-05-31 NOTE — Anesthesia Preprocedure Evaluation (Signed)
Anesthesia Evaluation  Patient identified by MRN, date of birth, ID band Patient awake    Reviewed: Allergy & Precautions, NPO status , Patient's Chart, lab work & pertinent test results  Airway Mallampati: II  TM Distance: >3 FB Neck ROM: Full    Dental  (+) Dental Advisory Given   Pulmonary sleep apnea ,    breath sounds clear to auscultation       Cardiovascular hypertension, Pt. on medications and Pt. on home beta blockers + DVT  + dysrhythmias Atrial Fibrillation  Rhythm:Regular Rate:Normal     Neuro/Psych negative neurological ROS     GI/Hepatic Neg liver ROS, hiatal hernia, GERD  ,  Endo/Other  negative endocrine ROS  Renal/GU negative Renal ROS     Musculoskeletal   Abdominal   Peds  Hematology negative hematology ROS (+)   Anesthesia Other Findings   Reproductive/Obstetrics                             Lab Results  Component Value Date   WBC 5.4 05/26/2021   HGB 13.2 05/26/2021   HCT 39.3 05/26/2021   MCV 91.4 05/26/2021   PLT 184 05/26/2021   Lab Results  Component Value Date   CREATININE 0.89 05/26/2021   BUN 23 05/26/2021   NA 140 05/26/2021   K 4.0 05/26/2021   CL 105 05/26/2021   CO2 27 05/26/2021    Anesthesia Physical Anesthesia Plan  ASA: 3  Anesthesia Plan: General   Post-op Pain Management:    Induction: Intravenous  PONV Risk Score and Plan: 2 and Dexamethasone, Ondansetron and Treatment may vary due to age or medical condition  Airway Management Planned: Oral ETT  Additional Equipment: None  Intra-op Plan:   Post-operative Plan: Extubation in OR  Informed Consent: I have reviewed the patients History and Physical, chart, labs and discussed the procedure including the risks, benefits and alternatives for the proposed anesthesia with the patient or authorized representative who has indicated his/her understanding and acceptance.     Dental  advisory given  Plan Discussed with: CRNA  Anesthesia Plan Comments:         Anesthesia Quick Evaluation

## 2021-05-31 NOTE — Op Note (Signed)
Preop Diagnosis: band complication of dysphagia  Postop Diagnosis: same  Procedure performed: laparoscopic gastric band removal, subcutaneous port removal, laparoscopic hiatal hernia repair  Assitant: Louanna Raw  Indications:  The patient with history of gastric adjustable band insertion for weight loss presents with dysphagia and reflux. On EGD he had barrett's esophagus.  Description of Operation:  Following informed consent, the patient was taken to the operating room and placed on the operating table in the supine position.  He had previously received prophylactic antibiotics.  After induction of general endotracheal anesthesia by the anesthesiologist, the patient underwent placement of sequential compression devices.  A timeout was confirmed by the surgery and anesthesia teams.  The patient was adequately padded at all pressure points and placed on a footboard to prevent slippage from the OR table during extremes of position during surgery.  He underwent a routine sterile prep and drape of her entire abdomen.    Next, A transverse incision was made under the left subcostal area and a 80m optical viewing trocar was introduced into the peritoneal cavity. Pneumoperitoneum was applied with a high flow and low pressure. A laparoscope was inserted to confirm placement. Adhesions of omentum to the abdominal wall were removed with harmonic scalpel. There was an intraperitoneal mesh in the lower abdomen that was left alone.  A extraperitoneal block was then placed at the lateral abdominal wall using exparel diluted with marcaine. 4 additional incisions were placed: 1 533mtrocar to the left of the midline, 1 1566mrocar in the right mid abdomen, 1 5mm39mocar in the right subcostal area, and a Nathanson retractor was placed through a subxiphoid incision.  The buckle of the band was identified. Cautery was used to free adhesions from over the buckle and the buckle was unclipped and cut free and  removed. Next, the band was slipped from around the stomach.   The band and tubing were removed via the 15mm70mcar.   The CT showed a 3 cm hiatal hernia. Therefore, the pars flaccida was incised with harmonic scalpel. 360 dissection of the crus off the esophagus was performed. There was 1 ethibond from previous repair that was divided. The sac was dissected free and 2 0 ethibond sutures placed in interrupted fashion.  Next, attention was turned to the subcutaneous port. The incision over the port was enlarged and cautery was used to remove the port away from surrounding tissues. The fascia was closed with 0 vicryl . All trocars were removed after pneumoperitoneum was evacuated. All skin incisions were closed with 4-0 monocryl by subcuticular technique.  Estimated blood loss: <30ml 86mcimens:  Band for gross only  Local Anesthesia: 50 ml Exparel:0.5% Marcaine mix  Post-Op Plan:       Pain Management: PO, prn      Antibiotics: Prophylactic      Anticoagulation: Prophylactic, Starting now      Post Op Studies/Consults: Not applicable      Intended Discharge: within 48h      Intended Outpatient Follow-Up: Two Week      Intended Outpatient Studies: Not Applicable      Other: Not Applicable  Enzley Kitchens AArta Brucenger

## 2021-06-01 ENCOUNTER — Encounter (HOSPITAL_COMMUNITY): Payer: Self-pay | Admitting: General Surgery

## 2021-06-01 DIAGNOSIS — K9509 Other complications of gastric band procedure: Secondary | ICD-10-CM | POA: Diagnosis not present

## 2021-06-01 LAB — CBC WITH DIFFERENTIAL/PLATELET
Abs Immature Granulocytes: 0.02 10*3/uL (ref 0.00–0.07)
Basophils Absolute: 0 10*3/uL (ref 0.0–0.1)
Basophils Relative: 0 %
Eosinophils Absolute: 0 10*3/uL (ref 0.0–0.5)
Eosinophils Relative: 0 %
HCT: 35.5 % — ABNORMAL LOW (ref 39.0–52.0)
Hemoglobin: 12.2 g/dL — ABNORMAL LOW (ref 13.0–17.0)
Immature Granulocytes: 0 %
Lymphocytes Relative: 8 %
Lymphs Abs: 0.5 10*3/uL — ABNORMAL LOW (ref 0.7–4.0)
MCH: 31.3 pg (ref 26.0–34.0)
MCHC: 34.4 g/dL (ref 30.0–36.0)
MCV: 91 fL (ref 80.0–100.0)
Monocytes Absolute: 0.5 10*3/uL (ref 0.1–1.0)
Monocytes Relative: 7 %
Neutro Abs: 5.4 10*3/uL (ref 1.7–7.7)
Neutrophils Relative %: 85 %
Platelets: 159 10*3/uL (ref 150–400)
RBC: 3.9 MIL/uL — ABNORMAL LOW (ref 4.22–5.81)
RDW: 13.4 % (ref 11.5–15.5)
WBC: 6.4 10*3/uL (ref 4.0–10.5)
nRBC: 0 % (ref 0.0–0.2)

## 2021-06-01 LAB — COMPREHENSIVE METABOLIC PANEL
ALT: 85 U/L — ABNORMAL HIGH (ref 0–44)
AST: 121 U/L — ABNORMAL HIGH (ref 15–41)
Albumin: 3.4 g/dL — ABNORMAL LOW (ref 3.5–5.0)
Alkaline Phosphatase: 93 U/L (ref 38–126)
Anion gap: 8 (ref 5–15)
BUN: 19 mg/dL (ref 8–23)
CO2: 26 mmol/L (ref 22–32)
Calcium: 8.9 mg/dL (ref 8.9–10.3)
Chloride: 103 mmol/L (ref 98–111)
Creatinine, Ser: 0.74 mg/dL (ref 0.61–1.24)
GFR, Estimated: >60 mL/min (ref >60)
Glucose, Bld: 139 mg/dL — ABNORMAL HIGH (ref 70–99)
Potassium: 3.5 mmol/L (ref 3.5–5.1)
Sodium: 137 mmol/L (ref 135–145)
Total Bilirubin: 0.6 mg/dL (ref 0.3–1.2)
Total Protein: 6.4 g/dL — ABNORMAL LOW (ref 6.5–8.1)

## 2021-06-01 NOTE — Discharge Summary (Signed)
Physician Discharge Summary  Grant Diaz [id number] DOB: 10-15-1951 DOA: 05/31/2021  PCP: Venita Lick, NP  Admit date: 05/31/2021 Discharge date: 06/01/2021  Recommendations for Outpatient Follow-up:   (include homehealth, outpatient follow-up instructions, specific recommendations for PCP to follow-up on, etc.)   Follow-up Information     Khrystina Bonnes, Arta Bruce, MD Follow up in 3 week(s).   Specialty: General Surgery Contact information: [place] [address] [phone]                Discharge Diagnoses:  Active Problems:   Dysphagia   Surgical Procedure: lap band removal with hiatal hernia repair, upper endoscopy  Discharge Condition: Good Disposition: Home  Diet recommendation: Postoperative sleeve gastrectomy diet (liquids only)  Filed Weights   05/31/21 1222  Weight: 113.9 kg     Hospital Course:  The patient was admitted after undergoing lap band removal with hiatal hernia repair. POD 0 he ambulated well. POD 1 he was started on the water diet protocol and tolerated 360 ml in the first shift. Once meeting the water amount he was advanced to bariatric protein shakes which they tolerated and were discharged home POD 1.  Treatments: surgery: lap band removal with hiatal hernia repair  Discharge Instructions  Discharge Instructions     Call MD for:  persistant nausea and vomiting   Complete by: As directed    Call MD for:  redness, tenderness, or signs of infection (pain, swelling, redness, odor or green/yellow discharge around incision site)   Complete by: As directed    Call MD for:  severe uncontrolled pain   Complete by: As directed    Call MD for:  temperature >100.4   Complete by: As directed    Diet full liquid   Complete by: As directed    Increase activity slowly   Complete by: As directed       Allergies as of 06/01/2021       Reactions   Adhesive [tape] Rash   "whelps", latex adhesive tape only,  paper tape is ok         Medication List     TAKE these medications    acetaminophen 325 MG tablet Commonly known as: TYLENOL Take 650 mg by mouth every 6 (six) hours as needed for mild pain.   amLODipine 10 MG tablet Commonly known as: NORVASC Take 1 tablet (10 mg total) by mouth daily.   celecoxib 200 MG capsule Commonly known as: CELEBREX Take 1 capsule (200 mg total) by mouth daily.   Cholecalciferol 25 MCG (1000 UT) capsule Take 1,000 Units by mouth daily.   ketoconazole 2 % cream Commonly known as: NIZORAL Apply 1 application topically daily as needed.   lisinopril-hydrochlorothiazide 20-25 MG tablet Commonly known as: ZESTORETIC Take 1 tablet by mouth once daily   multivitamin capsule Take 1 capsule by mouth daily.   neomycin-polymyxin-hydrocortisone OTIC solution Commonly known as: CORTISPORIN Place 3 drops into both ears 3 (three) times daily.   omeprazole 20 MG capsule Commonly known as: PRILOSEC Take 1 capsule (20 mg total) by mouth daily.   rosuvastatin 10 MG tablet Commonly known as: Crestor Take one tablet (10 MG) by mouth three times a week.   triamcinolone 0.025 % cream Commonly known as: KENALOG Apply 1 application topically 2 (two) times daily as needed (skin rash).   Xarelto 20 MG Tabs tablet Generic drug: rivaroxaban Take 20 mg by mouth daily.        Follow-up  Information     Cadyn Rodger, Arta Bruce, MD Follow up in 3 week(s).   Specialty: General Surgery Contact information: [place] [address] [phone]                  The results of significant diagnostics from this hospitalization (including imaging, microbiology, ancillary and laboratory) are listed below for reference.    Significant Diagnostic Studies: No results found.  Labs: Basic Metabolic Panel: Recent Labs  Lab 05/26/21 1342 06/01/21 0419  NA 140 137  K 4.0 3.5  CL 105 103  CO2 27 26  GLUCOSE 118* 139*  BUN 23 19   CREATININE 0.89 0.74  CALCIUM 9.4 8.9   Liver Function Tests: Recent Labs  Lab 06/01/21 0419  AST 121*  ALT 85*  ALKPHOS 93  BILITOT 0.6  PROT 6.4*  ALBUMIN 3.4*    CBC: Recent Labs  Lab 05/26/21 1342 05/31/21 1609 06/01/21 0419  WBC 5.4  --  6.4  NEUTROABS  --   --  5.4  HGB 13.2 12.7* 12.2*  HCT 39.3 37.7* 35.5*  MCV 91.4  --  91.0  PLT 184  --  159    CBG: No results for input(s): GLUCAP in the last 168 hours.  Active Problems:   Dysphagia   VTE plan: He will resume xarelto tomorrow ([email or web address])  Time coordinating discharge: 15 min

## 2021-06-01 NOTE — Plan of Care (Signed)
Instructions were reviewed with patient. All questions were answered. Patient was transported to main entrance by wheelchair. ° °

## 2021-06-01 NOTE — Progress Notes (Signed)
NUTRITION NOTE  Patient is POD #1 hiatal hernia repair and lap band removal. He had lap band placed in 2009. Patient reports that he has been diagnosed with Barrett's esophagus and it was thought that this may have been 2/2 lap band restricting motility.   Patient has been taking a multivitamin PTA. He reports that provider encouraged a liquids-only diet for the next 1 week. Provided patient handout outlining appropriate clear liquids and full liquids.   He reports that he has severe GI distress if he consumes red-40 or sugar alcohols.   He reports that he may opt for gastric bypass in the future, but that for now he feels he has been doing well with weight loss and weight maintenance. He reports losing 7 lb in the past 1 month d/t food choices and overall lifestyle.   No additional nutrition needs at this time. If needs arise, please re-consult RD.      Jarome Matin, MS, RD, LDN, CNSC Inpatient Clinical Dietitian RD pager # available in [place]  After hours/weekend pager # available in [facility]

## 2021-06-26 ENCOUNTER — Other Ambulatory Visit: Payer: Self-pay | Admitting: Nurse Practitioner

## 2021-06-26 NOTE — Telephone Encounter (Signed)
Requested Prescriptions  Pending Prescriptions Disp Refills  . lisinopril-hydrochlorothiazide (ZESTORETIC) 20-25 MG tablet [Pharmacy Med Name: Lisinopril-hydroCHLOROthiazide 20-25 MG Oral Tablet] 90 tablet 1    Sig: Take 1 tablet by mouth once daily     Cardiovascular:  ACEI + Diuretic Combos Failed - 06/26/2021  5:47 PM      Failed - Last BP in normal range    BP Readings from Last 1 Encounters:  06/01/21 (!) 147/70         Passed - Na in normal range and within 180 days    Sodium  Date Value Ref Range Status  06/01/2021 137 135 - 145 mmol/L Final  01/06/2021 141 134 - 144 mmol/L Final         Passed - K in normal range and within 180 days    Potassium  Date Value Ref Range Status  06/01/2021 3.5 3.5 - 5.1 mmol/L Final         Passed - Cr in normal range and within 180 days    Creatinine, Ser  Date Value Ref Range Status  06/01/2021 0.74 0.61 - 1.24 mg/dL Final         Passed - Ca in normal range and within 180 days    Calcium  Date Value Ref Range Status  06/01/2021 8.9 8.9 - 10.3 mg/dL Final         Passed - Patient is not pregnant      Passed - Valid encounter within last 6 months    Recent Outpatient Visits          3 months ago Mixed hyperlipidemia   [place], Rison T, NP   5 months ago Elevated amylase   [facility] [place], [place] T, NP   7 months ago Essential hypertension, benign   [facility] [place], [place] T, NP   8 months ago Gastroesophageal reflux disease without esophagitis   [facility] [place], Barrett T, NP   9 months ago Gastroesophageal reflux disease without esophagitis   [facility] [place], Barbaraann Faster, NP      Future Appointments            In 2 weeks Cannady, Barbaraann Faster, NP [employer]   In 2 weeks Stoioff, Ronda Fairly, MD [facility]   In 1 month Tahiliani, Lennette Bihari, MD [place]   In 9 months  [employer]

## 2021-07-11 ENCOUNTER — Ambulatory Visit: Payer: Medicare PPO | Admitting: Nurse Practitioner

## 2021-07-13 ENCOUNTER — Ambulatory Visit: Payer: Self-pay | Admitting: Urology

## 2021-07-27 ENCOUNTER — Telehealth: Payer: Self-pay

## 2021-07-27 NOTE — Telephone Encounter (Signed)
Patient informed to fast for upcoming appt. Can have water, black coffee, and take medications but otherwise nothing else to eat or drink. Pt verbalized understanding.

## 2021-07-27 NOTE — Telephone Encounter (Signed)
Copied from [place] [id number]. Topic: General - Other >> Jul 27, 2021 12:17 PM Tessa Lerner A wrote: Reason for CRM: Patient would like to be contacted by a member of staff to further explain to him the process for getting their lab work completed  The patient would like to know if they need to fast before their appointment on 08/01/21  Please contact further when available

## 2021-07-30 ENCOUNTER — Encounter: Payer: Self-pay | Admitting: Nurse Practitioner

## 2021-07-30 DIAGNOSIS — Z8719 Personal history of other diseases of the digestive system: Secondary | ICD-10-CM | POA: Insufficient documentation

## 2021-08-01 ENCOUNTER — Encounter: Payer: Self-pay | Admitting: Nurse Practitioner

## 2021-08-01 ENCOUNTER — Ambulatory Visit: Payer: Medicare PPO | Admitting: Nurse Practitioner

## 2021-08-01 ENCOUNTER — Other Ambulatory Visit: Payer: Self-pay

## 2021-08-01 VITALS — BP 128/77 | HR 59 | Temp 97.7°F | Wt 258.4 lb

## 2021-08-01 DIAGNOSIS — Z23 Encounter for immunization: Secondary | ICD-10-CM | POA: Diagnosis not present

## 2021-08-01 DIAGNOSIS — I4891 Unspecified atrial fibrillation: Secondary | ICD-10-CM | POA: Insufficient documentation

## 2021-08-01 DIAGNOSIS — D649 Anemia, unspecified: Secondary | ICD-10-CM

## 2021-08-01 DIAGNOSIS — G4733 Obstructive sleep apnea (adult) (pediatric): Secondary | ICD-10-CM

## 2021-08-01 DIAGNOSIS — Z8546 Personal history of malignant neoplasm of prostate: Secondary | ICD-10-CM

## 2021-08-01 DIAGNOSIS — K2270 Barrett's esophagus without dysplasia: Secondary | ICD-10-CM | POA: Diagnosis not present

## 2021-08-01 DIAGNOSIS — I480 Paroxysmal atrial fibrillation: Secondary | ICD-10-CM

## 2021-08-01 DIAGNOSIS — E782 Mixed hyperlipidemia: Secondary | ICD-10-CM

## 2021-08-01 DIAGNOSIS — R7301 Impaired fasting glucose: Secondary | ICD-10-CM

## 2021-08-01 DIAGNOSIS — Z8719 Personal history of other diseases of the digestive system: Secondary | ICD-10-CM

## 2021-08-01 DIAGNOSIS — R748 Abnormal levels of other serum enzymes: Secondary | ICD-10-CM

## 2021-08-01 DIAGNOSIS — I10 Essential (primary) hypertension: Secondary | ICD-10-CM

## 2021-08-01 DIAGNOSIS — E669 Obesity, unspecified: Secondary | ICD-10-CM

## 2021-08-01 DIAGNOSIS — Z9889 Other specified postprocedural states: Secondary | ICD-10-CM

## 2021-08-01 NOTE — Assessment & Plan Note (Signed)
Chronic, stable. Continue with CPAP QHS, 100% use 

## 2021-08-01 NOTE — Assessment & Plan Note (Signed)
Performed on 05/31/21 -- healed well.  Recheck CMP, Amylase, Lipase, and GGT today + CBC.

## 2021-08-01 NOTE — Assessment & Plan Note (Signed)
BMI 39.29, some gain since lap band removal.  May benefit from weekly injectable like Ozempic/Wegovy, educated him on this.  Recent glucose has been elevated, check A1c today.  Recommended eating smaller high protein, low fat meals more frequently and exercising 30 mins a day 5 times a week with a goal of 10-15lb weight loss in the next 3 months. Patient voiced their understanding and motivation to adhere to these recommendations.

## 2021-08-01 NOTE — Assessment & Plan Note (Signed)
Chronic, stable. No change to medication regimen, continue as is and adjust as needed.  Recommend he monitor BP at home weekly and document for provider + focus on DASH diet. CMP and CBC today. Follow-up in February for physical.

## 2021-08-01 NOTE — Progress Notes (Signed)
BP 128/77   Pulse (!) 59   Temp 97.7 F (36.5 C) (Oral)   Wt 258 lb 6.4 oz (117.2 kg)   SpO2 100%   BMI 39.29 kg/m    Subjective:    Patient ID: Grant Diaz, male    DOB: 07-Oct-1951, 70 y.o.   MRN: [id number]  HPI: Grant Diaz is a 70 y.o. male  Chief Complaint  Patient presents with   Hyperlipidemia   Hypertension   Sleep Apnea   Gastroesophageal Reflux   Elevated PSA    Patient would like to have a PSA check at today's visit and sent to his Urologist.    Hernia    Patient states he has had a hernia repair surgery and he would like to discuss with provider.    Weight Check    Patient states he would like to discuss the next recommended steps for weight management and would like to discuss with provider.   BARRETT's ESOPHAGUS Followed by GI with recent hiatal hernia repair and lap band removal on 05/31/21 -- since this time has had less gas discomfort or dysphagia.  His concern now is weight gain without lap band = he has gained 7 pounds since surgery -- he is interested in weight loss medications.  Barrett's esophagus present -- is recommended to continue PPI long term.  In 2010 CT showed possible pancreatic mass, but MRI showed no mass.   Post surgery hemoglobin mildly low on review.   GERD control status: controlled  Satisfied with current treatment? yes Heartburn frequency: improved Medication side effects: no  Medication compliance: stable Previous GERD medications: Antacid use frequency: none Aggravating factors: Larger meals, greasy food Dysphagia: none Odynophagia:  no Hematemesis: no Blood in stool: no EGD: yes   ATRIAL FIBRILLATION Was diagnosed prior to recent surgery and started on Xarelto by Dr. Humphrey Rolls. Atrial fibrillation status: controlled Satisfied with current treatment: yes  Medication side effects:  no Medication compliance: good compliance Etiology of atrial fibrillation:  Palpitations:  no Chest pain:  no Dyspnea on exertion:   no Orthopnea:  no Syncope:  no Edema:  no Ventricular rate control:  none Anti-coagulation: long acting   HYPERLIPIDEMIA/HTN Continues on Lisinopril/HCTZ and Amlodipine.  Started on Rosuvastatin 10 MG daily in March 2022 for elevation in LDL.  That caused aches in arms and could not sleep, stopped statin and sleeping better.  Currently is taking Crestor 3 days a week only. Hyperlipidemia status: good compliance Satisfied with current treatment?  yes Side effects:   occasional muscle aches Medication compliance: good compliance Supplements: none Aspirin:  yes The 10-year ASCVD risk score (Arnett DK, et al., 2019) is: 20.8%   Values used to calculate the score:     Age: 37 years     Sex: Male     Is Non-Hispanic African American: No     Diabetic: No     Tobacco smoker: No     Systolic Blood Pressure: 409 mmHg     Is BP treated: Yes     HDL Cholesterol: 45 mg/dL     Total Cholesterol: 156 mg/dL Chest pain:  no Coronary artery disease:  no Family history CAD:  yes -- father and grandfather Family history early CAD:  no  ELEVATED PSA: Followed by urology, last seen 11/24/20 and last PSA March 2022 = <0.1.  Dr. Bernardo Heater follows -- internal and external radiation history, cancer free almost 18 years.   Nocturia: no Urinary frequency:no Incomplete voiding: no Urgency:  no Weak urinary stream: no Straining to start stream: no  Relevant past medical, surgical, family and social history reviewed and updated as indicated. Interim medical history since our last visit reviewed. Allergies and medications reviewed and updated.  Review of Systems  Constitutional:  Negative for activity change, diaphoresis, fatigue and fever.  Respiratory:  Negative for cough, chest tightness, shortness of breath and wheezing.   Cardiovascular:  Negative for chest pain, palpitations and leg swelling.  Gastrointestinal: Negative.   Neurological: Negative.   Psychiatric/Behavioral: Negative.     Per HPI  unless specifically indicated above     Objective:    BP 128/77   Pulse (!) 59   Temp 97.7 F (36.5 C) (Oral)   Wt 258 lb 6.4 oz (117.2 kg)   SpO2 100%   BMI 39.29 kg/m   Wt Readings from Last 3 Encounters:  08/01/21 258 lb 6.4 oz (117.2 kg)  05/31/21 251 lb 1.7 oz (113.9 kg)  05/26/21 251 lb (113.9 kg)    Physical Exam Vitals and nursing note reviewed.  Constitutional:      General: He is awake. He is not in acute distress.    Appearance: He is well-developed. He is obese. He is not ill-appearing.  HENT:     Head: Normocephalic and atraumatic.     Right Ear: Hearing normal. No drainage.     Left Ear: Hearing normal. No drainage.  Eyes:     General: Lids are normal.        Right eye: No discharge.        Left eye: No discharge.     Conjunctiva/sclera: Conjunctivae normal.     Pupils: Pupils are equal, round, and reactive to light.  Neck:     Vascular: No carotid bruit.  Cardiovascular:     Rate and Rhythm: Normal rate. Rhythm irregularly irregular.     Heart sounds: Normal heart sounds, S1 normal and S2 normal. No murmur heard.   No gallop.  Pulmonary:     Effort: Pulmonary effort is normal. No accessory muscle usage or respiratory distress.     Breath sounds: Normal breath sounds.  Abdominal:     General: Bowel sounds are normal. There is no distension.     Palpations: Abdomen is soft.     Tenderness: There is no abdominal tenderness. There is no right CVA tenderness or left CVA tenderness.     Hernia: No hernia is present.  Musculoskeletal:        General: Normal range of motion.     Cervical back: Normal range of motion and neck supple.     Right lower leg: No edema.     Left lower leg: No edema.  Skin:    General: Skin is warm and dry.  Neurological:     Mental Status: He is alert and oriented to person, place, and time.  Psychiatric:        Attention and Perception: Attention normal.        Mood and Affect: Mood normal.        Speech: Speech normal.         Behavior: Behavior normal. Behavior is cooperative.        Thought Content: Thought content normal.   Results for orders placed or performed during the hospital encounter of 05/31/21  Hemoglobin and hematocrit, blood  Result Value Ref Range   Hemoglobin 12.7 (L) 13.0 - 17.0 g/dL   HCT 37.7 (L) 39.0 - 52.0 %  CBC WITH DIFFERENTIAL  Result Value Ref Range   WBC 6.4 4.0 - 10.5 K/uL   RBC 3.90 (L) 4.22 - 5.81 MIL/uL   Hemoglobin 12.2 (L) 13.0 - 17.0 g/dL   HCT 35.5 (L) 39.0 - 52.0 %   MCV 91.0 80.0 - 100.0 fL   MCH 31.3 26.0 - 34.0 pg   MCHC 34.4 30.0 - 36.0 g/dL   RDW 13.4 11.5 - 15.5 %   Platelets 159 150 - 400 K/uL   nRBC 0.0 0.0 - 0.2 %   Neutrophils Relative % 85 %   Neutro Abs 5.4 1.7 - 7.7 K/uL   Lymphocytes Relative 8 %   Lymphs Abs 0.5 (L) 0.7 - 4.0 K/uL   Monocytes Relative 7 %   Monocytes Absolute 0.5 0.1 - 1.0 K/uL   Eosinophils Relative 0 %   Eosinophils Absolute 0.0 0.0 - 0.5 K/uL   Basophils Relative 0 %   Basophils Absolute 0.0 0.0 - 0.1 K/uL   Immature Granulocytes 0 %   Abs Immature Granulocytes 0.02 0.00 - 0.07 K/uL  Comprehensive metabolic panel  Result Value Ref Range   Sodium 137 135 - 145 mmol/L   Potassium 3.5 3.5 - 5.1 mmol/L   Chloride 103 98 - 111 mmol/L   CO2 26 22 - 32 mmol/L   Glucose, Bld 139 (H) 70 - 99 mg/dL   BUN 19 8 - 23 mg/dL   Creatinine, Ser 0.74 0.61 - 1.24 mg/dL   Calcium 8.9 8.9 - 10.3 mg/dL   Total Protein 6.4 (L) 6.5 - 8.1 g/dL   Albumin 3.4 (L) 3.5 - 5.0 g/dL   AST 121 (H) 15 - 41 U/L   ALT 85 (H) 0 - 44 U/L   Alkaline Phosphatase 93 38 - 126 U/L   Total Bilirubin 0.6 0.3 - 1.2 mg/dL   GFR, Estimated >60 >60 mL/min   Anion gap 8 5 - 15      Assessment & Plan:   Problem List Items Addressed This Visit       Cardiovascular and Mediastinum   Essential hypertension, benign    Chronic, stable. No change to medication regimen, continue as is and adjust as needed.  Recommend he monitor BP at home weekly and document for  provider + focus on DASH diet. CMP and CBC today. Follow-up in February for physical.      Atrial fibrillation [facility]) - Primary    Diagnosed by cardiology. Dr. Humphrey Rolls, prior to surgery 05/31/21.  No current BB or rate control.  Continue Xarelto as ordered, has follow-up upcoming and will attempt to obtain office notes.        Respiratory   Sleep apnea    Chronic, stable. Continue with CPAP QHS, 100% use        Digestive   Barrett esophagus    Chronic, ongoing noted on EGD in 2022.   Continue current medication regimen and adjust as needed, lifelong PPI.  Return in February for physical and check Mag level annually.        Other   Obesity (BMI 35.0-39.9 without comorbidity)    BMI 39.29, some gain since lap band removal.  May benefit from weekly injectable like Ozempic/Wegovy, educated him on this.  Recent glucose has been elevated, check A1c today.  Recommended eating smaller high protein, low fat meals more frequently and exercising 30 mins a day 5 times a week with a goal of 10-15lb weight loss in the next 3 months. Patient voiced their understanding and motivation to adhere  to these recommendations.       History of prostate cancer    Stable.  PSA today. Continue to collaborate with Dr. Bernardo Heater with care and future needs.       Relevant Orders   PSA   Mixed hyperlipidemia    Chronic, ongoing.  Poor tolerance of daily Crestor at low dose.  Will continue 3 day a week dosing at this time.  Lipid panel today.  Educated patient on statin use and side effects.      Relevant Orders   Lipid Panel w/o Chol/HDL Ratio   Elevated amylase    Recheck today post surgery, along with lipase and GGT + CMP.      Relevant Orders   Amylase   Lipase   History of repair of hiatal hernia    Performed on 05/31/21 -- healed well.  Recheck CMP, Amylase, Lipase, and GGT today + CBC.      Relevant Orders   CBC with Differential/Platelet   Comprehensive metabolic panel   Gamma GT   Other Visit  Diagnoses     IFG (impaired fasting glucose)       Check A1c today and consider addition of GLP1 if elevations noted.   Relevant Orders   HgB A1c   Low hemoglobin       Recheck CBC today post surgery + iron level.   Relevant Orders   Iron, TIBC and Ferritin Panel   CBC with Differential/Platelet   Flu vaccine need       Relevant Orders   Flu Vaccine QUAD High Dose(Fluad)        Follow up plan: Return in about 4 months (around 11/25/2021) for Annual physical.

## 2021-08-01 NOTE — Assessment & Plan Note (Signed)
Chronic, ongoing.  Poor tolerance of daily Crestor at low dose.  Will continue 3 day a week dosing at this time.  Lipid panel today.  Educated patient on statin use and side effects. 

## 2021-08-01 NOTE — Patient Instructions (Signed)

## 2021-08-01 NOTE — Assessment & Plan Note (Signed)
Stable.  PSA today. Continue to collaborate with Dr. Bernardo Heater with care and future needs.

## 2021-08-01 NOTE — Assessment & Plan Note (Addendum)
Recheck today post surgery, along with lipase and GGT + CMP.

## 2021-08-01 NOTE — Assessment & Plan Note (Signed)
Diagnosed by cardiology. Dr. Humphrey Rolls, prior to surgery 05/31/21.  No current BB or rate control.  Continue Xarelto as ordered, has follow-up upcoming and will attempt to obtain office notes.

## 2021-08-01 NOTE — Assessment & Plan Note (Signed)
Chronic, ongoing noted on EGD in 2022.   Continue current medication regimen and adjust as needed, lifelong PPI.  Return in February for physical and check Mag level annually.

## 2021-08-02 LAB — COMPREHENSIVE METABOLIC PANEL
ALT: 13 IU/L (ref 0–44)
AST: 22 IU/L (ref 0–40)
Albumin/Globulin Ratio: 2 (ref 1.2–2.2)
Albumin: 4.3 g/dL (ref 3.7–4.7)
Alkaline Phosphatase: 82 IU/L (ref 44–121)
BUN/Creatinine Ratio: 24 (ref 10–24)
BUN: 20 mg/dL (ref 8–27)
Bilirubin Total: 0.6 mg/dL (ref 0.0–1.2)
CO2: 24 mmol/L (ref 20–29)
Calcium: 9.2 mg/dL (ref 8.6–10.2)
Chloride: 102 mmol/L (ref 96–106)
Creatinine, Ser: 0.82 mg/dL (ref 0.76–1.27)
Globulin, Total: 2.1 g/dL (ref 1.5–4.5)
Glucose: 83 mg/dL (ref 70–99)
Potassium: 3.8 mmol/L (ref 3.5–5.2)
Sodium: 138 mmol/L (ref 134–144)
Total Protein: 6.4 g/dL (ref 6.0–8.5)
eGFR: 94 mL/min/{1.73_m2} (ref >59)

## 2021-08-02 LAB — IRON,TIBC AND FERRITIN PANEL
Ferritin: 98 ng/mL (ref 30–400)
Iron Saturation: 29 % (ref 15–55)
Iron: 81 ug/dL (ref 38–169)
Total Iron Binding Capacity: 280 ug/dL (ref 250–450)
UIBC: 199 ug/dL (ref 111–343)

## 2021-08-02 LAB — LIPID PANEL W/O CHOL/HDL RATIO
Cholesterol, Total: 129 mg/dL (ref 100–199)
HDL: 45 mg/dL (ref >39)
LDL Chol Calc (NIH): 71 mg/dL (ref 0–99)
Triglycerides: 58 mg/dL (ref 0–149)
VLDL Cholesterol Cal: 13 mg/dL (ref 5–40)

## 2021-08-02 LAB — CBC WITH DIFFERENTIAL/PLATELET
Basophils Absolute: 0 10*3/uL (ref 0.0–0.2)
Basos: 1 %
EOS (ABSOLUTE): 0.4 10*3/uL (ref 0.0–0.4)
Eos: 7 %
Hematocrit: 39.1 % (ref 37.5–51.0)
Hemoglobin: 13.4 g/dL (ref 13.0–17.7)
Immature Grans (Abs): 0 10*3/uL (ref 0.0–0.1)
Immature Granulocytes: 0 %
Lymphocytes Absolute: 1.2 10*3/uL (ref 0.7–3.1)
Lymphs: 21 %
MCH: 31.1 pg (ref 26.6–33.0)
MCHC: 34.3 g/dL (ref 31.5–35.7)
MCV: 91 fL (ref 79–97)
Monocytes Absolute: 0.6 10*3/uL (ref 0.1–0.9)
Monocytes: 10 %
Neutrophils Absolute: 3.5 10*3/uL (ref 1.4–7.0)
Neutrophils: 61 %
Platelets: 190 10*3/uL (ref 150–450)
RBC: 4.31 x10E6/uL (ref 4.14–5.80)
RDW: 12.4 % (ref 11.6–15.4)
WBC: 5.8 10*3/uL (ref 3.4–10.8)

## 2021-08-02 LAB — LIPASE: Lipase: 21 U/L (ref 13–78)

## 2021-08-02 LAB — PSA: Prostate Specific Ag, Serum: <0.1 ng/mL (ref 0.0–4.0)

## 2021-08-02 LAB — AMYLASE: Amylase: 94 U/L (ref 31–110)

## 2021-08-02 LAB — GAMMA GT: GGT: 20 IU/L (ref 0–65)

## 2021-08-02 LAB — HEMOGLOBIN A1C
Est. average glucose Bld gHb Est-mCnc: 105 mg/dL
Hgb A1c MFr Bld: 5.3 % (ref 4.8–5.6)

## 2021-08-02 NOTE — Progress Notes (Signed)
Contacted via MyChart   Good afternoon Grant Diaz, overall since your surgery these labs looked MUCH improved.  Cholesterol levels are also doing great with 3 day a week dosing.  I am very happy with all of these.  A1c does not show any prediabetes:(.  I would recommend you reach out to your insurance and ask about costs for Cadott, Wegovy, Saxenda, or Contrave for weight loss.  Any questions? Keep being amazing!!  Thank you for allowing me to participate in your care.  I appreciate you. Kindest regards, Lotus Santillo

## 2021-08-09 ENCOUNTER — Encounter: Payer: Self-pay | Admitting: Gastroenterology

## 2021-08-09 ENCOUNTER — Ambulatory Visit: Payer: Medicare PPO | Admitting: Gastroenterology

## 2021-08-09 ENCOUNTER — Other Ambulatory Visit: Payer: Self-pay

## 2021-08-09 VITALS — BP 137/78 | HR 66 | Temp 98.1°F | Ht 68.0 in | Wt 259.0 lb

## 2021-08-09 DIAGNOSIS — K2270 Barrett's esophagus without dysplasia: Secondary | ICD-10-CM | POA: Diagnosis not present

## 2021-08-09 DIAGNOSIS — R1319 Other dysphagia: Secondary | ICD-10-CM | POA: Diagnosis not present

## 2021-08-09 NOTE — Progress Notes (Signed)
Vonda Antigua, MD [address]  [place]  [place], [place] [address]  Main: [phone]  Fax: [phone]   Primary Care Physician: Venita Lick, NP   Chief complaint: Dysphagia follow-up  HPI: Grant Diaz is a 70 y.o. male here for follow-up of dysphagia.  Patient saw general surgery on 06/30/2021.  GI records available in [place] were reviewed.  He underwent laparoscopic band removal and hiatal hernia repair and has been doing well since then.  Reports resolution of dysphagia since then.  No nausea or vomiting.  No abdominal pain.  Good appetite.  No altered bowel habits.  No blood in stool.    ROS: All ROS reviewed and negative except as per HPI   Past Medical History:  Diagnosis Date   Arthritis    Atrial fibrillation ([place])    Barrett's esophagus    Cancer ([place]) 2008   Prostate-radiation   Cataract 2019   not yet significant enough to warrant surgery   Dysrhythmia    Afib   GERD (gastroesophageal reflux disease)    History of DVT (deep vein thrombosis)    History of hiatal hernia    History of kidney stones    Hypertension    Joint pain    Mixed hyperlipidemia    Pancreatitis 2013   [facility]   Prostate cancer [facility])    Pulmonary embolus ([place]) 12/2014   Bilateral pulmonary embolus, [facility]   Sleep apnea 2010   use CPAP regularly   Sleep apnea, obstructive    cpap    Past Surgical History:  Procedure Laterality Date   CARPAL TUNNEL RELEASE Right 03/15/2017   Procedure: CARPAL TUNNEL RELEASE;  Surgeon: Hessie Knows, MD;  Location: [facility] ORS;  Service: Orthopedics;  Laterality: Right;   CHOLECYSTECTOMY     COLONOSCOPY WITH PROPOFOL N/A 04/02/2018   Procedure: COLONOSCOPY WITH PROPOFOL;  Surgeon: Lucilla Lame, MD;  Location: [facility] ENDOSCOPY;  Service: Endoscopy;  Laterality: N/A;   ESOPHAGOGASTRODUODENOSCOPY N/A 05/28/2013   Procedure: ESOPHAGOGASTRODUODENOSCOPY (EGD);  Surgeon: Shann Medal, MD;  Location: [facility];  Service: General;   Laterality: N/A;   ESOPHAGOGASTRODUODENOSCOPY (EGD) WITH PROPOFOL N/A 02/08/2021   Procedure: ESOPHAGOGASTRODUODENOSCOPY (EGD) WITH PROPOFOL;  Surgeon: Virgel Manifold, MD;  Location: [facility] ENDOSCOPY;  Service: Endoscopy;  Laterality: N/A;   GALLBLADDER SURGERY  2003   GANGLION CYST EXCISION Right 03/15/2017   Procedure: REMOVAL GANGLION OF WRIST;  Surgeon: Hessie Knows, MD;  Location: [facility] ORS;  Service: Orthopedics;  Laterality: Right;   HERNIA REPAIR     HIATAL HERNIA REPAIR N/A 05/31/2021   Procedure: LAPAROSCOPIC REPAIR OF HIATAL HERNIA WITH LAP BAND REMOVAL;  Surgeon: Kieth Brightly Arta Bruce, MD;  Location: [facility] ORS;  Service: General;  Laterality: N/A;   JOINT REPLACEMENT     KNEE ARTHROSCOPY Bilateral [id number]   LAPAROSCOPIC GASTRIC BANDING  2009   and hernia repair   LAPAROSCOPIC REPAIR OF HIATAL HERNIA WITH LAP BAND REMOVAL   05/31/2021   RADIATION IMPLANT W/ ULTRASOUND, MALE  2005   internal/external radiation for prostate cancer   TOTAL KNEE ARTHROPLASTY Left 10/12/2015   Procedure: TOTAL KNEE ARTHROPLASTY;  Surgeon: Hessie Knows, MD;  Location: [facility] ORS;  Service: Orthopedics;  Laterality: Left;   TOTAL KNEE ARTHROPLASTY Right 05/15/2017   Procedure: TOTAL KNEE ARTHROPLASTY;  Surgeon: Hessie Knows, MD;  Location: [facility] ORS;  Service: Orthopedics;  Laterality: Right;   UMBILICAL HERNIA REPAIR  2010    Prior to Admission medications   Medication Sig Start  Date End Date Taking? Authorizing Provider  acetaminophen (TYLENOL) 325 MG tablet Take 650 mg by mouth every 6 (six) hours as needed for mild pain.   Yes [provider]  amLODipine (NORVASC) 10 MG tablet Take 1 tablet (10 mg total) by mouth daily. 11/24/20  Yes Cannady, Henrine Screws T, NP  celecoxib (CELEBREX) 200 MG capsule Take 1 capsule (200 mg total) by mouth daily. 10/27/20  Yes Cannady, Henrine Screws T, NP  Cholecalciferol 25 MCG (1000 UT) capsule Take 1,000 Units by mouth daily.   Yes [provider]  ketoconazole  (NIZORAL) 2 % cream Apply 1 application topically daily as needed. 11/10/20  Yes Cannady, Henrine Screws T, NP  lisinopril-hydrochlorothiazide (ZESTORETIC) 20-25 MG tablet Take 1 tablet by mouth once daily 06/26/21  Yes Cannady, Jolene T, NP  Multiple Vitamin (MULTIVITAMIN) capsule Take 1 capsule by mouth daily.   Yes [provider]  neomycin-polymyxin-hydrocortisone (CORTISPORIN) OTIC solution Place 3 drops into both ears 3 (three) times daily. 10/13/20  Yes Cannady, Jolene T, NP  omeprazole (PRILOSEC) 20 MG capsule Take 1 capsule (20 mg total) by mouth daily. 09/07/20  Yes Cannady, Jolene T, NP  rosuvastatin (CRESTOR) 10 MG tablet Take one tablet (10 MG) by mouth three times a week. 03/23/21  Yes Cannady, Jolene T, NP  triamcinolone (KENALOG) 0.025 % cream Apply 1 application topically 2 (two) times daily as needed (skin rash). 11/10/20  Yes Cannady, Jolene T, NP  XARELTO 20 MG TABS tablet Take 20 mg by mouth daily. 04/28/21  Yes [provider]    Family History  Problem Relation Age of Onset   Diabetes Mother    Arthritis Mother    Obesity Mother    Parkinson's disease Father    Hypertension Brother    Arthritis Brother    Asthma Brother    Diabetes Son        type 1   Heart disease Maternal Grandmother        CHF   Stroke Maternal Grandfather    Heart disease Paternal Grandmother        CHF   Stroke Paternal Grandfather      Social History   Tobacco Use   Smoking status: Never   Smokeless tobacco: Never  Vaping Use   Vaping Use: Never used  Substance Use Topics   Alcohol use: Not Currently    Comment: quit using alcohol upon entering ministry 1977   Drug use: Never    Allergies as of 08/09/2021 - Review Complete 08/09/2021  Allergen Reaction Noted   Adhesive [tape] Rash 09/29/2015    Physical Examination:  Constitutional: General:   Alert,  Well-developed, well-nourished, pleasant and cooperative in NAD BP 137/78   Pulse 66   Temp 98.1 F (36.7 C) (Oral)    Ht 5\' 8"  (1.727 m)   Wt 259 lb (117.5 kg)   BMI 39.38 kg/m   Respiratory: Normal respiratory effort  Gastrointestinal:  Soft, non-tender and non-distended without masses, hepatosplenomegaly or hernias noted.  No guarding or rebound tenderness.     Cardiac: No clubbing or edema.  No cyanosis. Normal posterior tibial pedal pulses noted.  Psych:  Alert and cooperative. Normal mood and affect.  Musculoskeletal:  Normal gait. Head normocephalic, atraumatic. Symmetrical without gross deformities. 5/5 Lower extremity strength bilaterally.  Skin: Warm. Intact without significant lesions or rashes. No jaundice.  Neck: Supple, trachea midline  Lymph: No cervical lymphadenopathy  Psych:  Alert and oriented x3, Alert and cooperative. Normal mood and affect.  Labs:  CMP     Component Value Date/Time   NA 138 08/01/2021 1034   K 3.8 08/01/2021 1034   CL 102 08/01/2021 1034   CO2 24 08/01/2021 1034   GLUCOSE 83 08/01/2021 1034   GLUCOSE 139 (H) 06/01/2021 0419   BUN 20 08/01/2021 1034   CREATININE 0.82 08/01/2021 1034   CALCIUM 9.2 08/01/2021 1034   PROT 6.4 08/01/2021 1034   ALBUMIN 4.3 08/01/2021 1034   AST 22 08/01/2021 1034   ALT 13 08/01/2021 1034   ALKPHOS 82 08/01/2021 1034   BILITOT 0.6 08/01/2021 1034   GFRNONAA >60 06/01/2021 0419   GFRAA 101 11/24/2020 0947   Lab Results  Component Value Date   WBC 5.8 08/01/2021   HGB 13.4 08/01/2021   HCT 39.1 08/01/2021   MCV 91 08/01/2021   PLT 190 08/01/2021    Imaging Studies:   Assessment and Plan:   Grant Diaz is a 70 y.o. y/o male here for follow-up of dysphagia  Symptoms have completely resolved since laparoscopic band removal by general surgery and hiatal hernia repair in September 2022  Patient was found to have Barrett's esophagus and has been compliant with PPI once daily Proper use 30 to 45 minutes before breakfast discussed.  He is not having any breakthrough symptoms  Literature recommends indefinite  therapy with PPI for patients with Barrett's esophagus with or without symptoms and therefore based on this, continue PPI at this time  He is on low-dose omeprazole  If symptoms worsen or breakthrough symptoms occur, patient advised to call us back and he verbalized understanding  Acid reflux lifestyle modification discussed  Barrett's surveillance in 3 years discussed and patient chooses to continue surveillance as opposed to stopping symptoms completely.  Recall EGD need to be placed in chart for 3 years  Follow-up in clinic in 1 year or earlier if needed  (Risks of PPI use were discussed with patient including bone loss, C. Diff diarrhea, pneumonia, infections, CKD, electrolyte abnormalities.  Pt. Verbalizes understanding and chooses to continue the medication.)   Dr Vonda Antigua

## 2021-09-14 ENCOUNTER — Other Ambulatory Visit: Payer: Self-pay | Admitting: Nurse Practitioner

## 2021-09-14 NOTE — Telephone Encounter (Signed)
Requested medication (s) are due for refill today Yes  Requested medication (s) are on the active medication list Yes  Future visit scheduled  Yes for 11/25/21  Note to clinic-Last ordered 09/07/20-prescription expired. Routing for new prescription.   Requested Prescriptions  Pending Prescriptions Disp Refills   omeprazole (PRILOSEC) 20 MG capsule [Pharmacy Med Name: Omeprazole 20 MG Oral Capsule Delayed Release] 90 capsule 0    Sig: Take 1 capsule by mouth once daily     Gastroenterology: Proton Pump Inhibitors Passed - 09/14/2021 10:59 AM      Passed - Valid encounter within last 12 months    Recent Outpatient Visits           1 month ago Paroxysmal atrial fibrillation ([place])   [place], Henrine Screws T, NP   6 months ago Mixed hyperlipidemia   [place], [place] T, NP   8 months ago Elevated amylase   [place], [place] T, NP   9 months ago Essential hypertension, benign   [place], [place] T, NP   11 months ago Gastroesophageal reflux disease without esophagitis   [place], Barbaraann Faster, NP       Future Appointments             In 2 months Cannady, Barbaraann Faster, NP [employer]   In 3 months Ralene Bathe, MD [place]   In 7 months  [facility] [place]

## 2021-10-11 ENCOUNTER — Encounter: Payer: Self-pay | Admitting: Nurse Practitioner

## 2021-10-11 ENCOUNTER — Ambulatory Visit: Payer: Self-pay

## 2021-10-11 ENCOUNTER — Other Ambulatory Visit: Payer: Self-pay

## 2021-10-11 ENCOUNTER — Ambulatory Visit: Payer: Medicare PPO | Admitting: Nurse Practitioner

## 2021-10-11 VITALS — BP 144/77 | HR 64 | Temp 98.7°F | Wt 255.6 lb

## 2021-10-11 DIAGNOSIS — R195 Other fecal abnormalities: Secondary | ICD-10-CM | POA: Diagnosis not present

## 2021-10-11 NOTE — Patient Instructions (Addendum)
We will let you know the results of the stool test when it comes back  If you have ongoing dark stools, call and make an appointment with your GI doctor:  Dr. Bonna Gains  Address: [address], [place], [place] [address] Phone: [phone]

## 2021-10-11 NOTE — Telephone Encounter (Signed)
Third attempt to speak to pt. LM on VM. Routing back to office. Attempted to cal [facility] no answer

## 2021-10-11 NOTE — Assessment & Plan Note (Addendum)
VSS. Hgb in office 13.9. No red flags on exam. Check hemoccult, CMP. Encouraged him to reach out to Dr. Michele Mcalpine office to schedule an appointment, phone number provided on AVS. ER precautions discussed. Keep next scheduled appointment with PCP.

## 2021-10-11 NOTE — Telephone Encounter (Signed)
Pt is scheduled for today with Lauren at 2:20

## 2021-10-11 NOTE — Telephone Encounter (Signed)
Summary: Black stool   black stool, weakness for 6 days, patient scheduled to see physician on  10/13/2021 but concerned about symptoms     Called pt and LM on VM to call back.

## 2021-10-11 NOTE — Telephone Encounter (Signed)
Second attempt to reach pt. LM on VM to call back.

## 2021-10-11 NOTE — Progress Notes (Signed)
Acute Office Visit  Subjective:    Patient ID: Grant Diaz, male    DOB: [birth year], 70 y.o.   MRN: [id number]  Chief Complaint  Patient presents with   Stool Color Change    Pt states he has noticed his stool dark and black in color since Thursday morning. States his stools have been runny and liquid since then as well. States he he has been very fatigues and weak as well.     HPI Patient is in today for dark and loose stools for the past 5 days. He also endorses a runny nose and cough for the same time. He denies taking iron supplements and only took 1 dose of peptobismol. Today, his stool started getting lighter in color.   DARK STOOLS  Duration: 5 days Bright red rectal bleeding: no  Frequency: with every bowel movement Melena: yes  Spotting on toilet tissue: no  Anal fullness: no  Perianal pain: no  Severity: moderate Perianal irritation/itching: no  Constipation: no  Chronic straining/valsava:  no  Anal trauma/intercourse: no    Past Medical History:  Diagnosis Date   Arthritis    Atrial fibrillation ([place])    Barrett's esophagus    Cancer ([place]) 2008   Prostate-radiation   Cataract 2019   not yet significant enough to warrant surgery   Dysrhythmia    Afib   GERD (gastroesophageal reflux disease)    History of DVT (deep vein thrombosis)    History of hiatal hernia    History of kidney stones    Hypertension    Joint pain    Mixed hyperlipidemia    Pancreatitis 2013   [facility]   Prostate cancer [facility])    Pulmonary embolus ([place]) 12/2014   Bilateral pulmonary embolus, [facility]   Sleep apnea 2010   use CPAP regularly   Sleep apnea, obstructive    cpap    Past Surgical History:  Procedure Laterality Date   CARPAL TUNNEL RELEASE Right 03/15/2017   Procedure: CARPAL TUNNEL RELEASE;  Surgeon: Hessie Knows, MD;  Location: [facility] ORS;  Service: Orthopedics;  Laterality: Right;   CHOLECYSTECTOMY     COLONOSCOPY WITH PROPOFOL N/A 04/02/2018   Procedure:  COLONOSCOPY WITH PROPOFOL;  Surgeon: Lucilla Lame, MD;  Location: [facility] ENDOSCOPY;  Service: Endoscopy;  Laterality: N/A;   ESOPHAGOGASTRODUODENOSCOPY N/A 05/28/2013   Procedure: ESOPHAGOGASTRODUODENOSCOPY (EGD);  Surgeon: Shann Medal, MD;  Location: [facility];  Service: General;  Laterality: N/A;   ESOPHAGOGASTRODUODENOSCOPY (EGD) WITH PROPOFOL N/A 02/08/2021   Procedure: ESOPHAGOGASTRODUODENOSCOPY (EGD) WITH PROPOFOL;  Surgeon: Virgel Manifold, MD;  Location: [facility] ENDOSCOPY;  Service: Endoscopy;  Laterality: N/A;   GALLBLADDER SURGERY  2003   GANGLION CYST EXCISION Right 03/15/2017   Procedure: REMOVAL GANGLION OF WRIST;  Surgeon: Hessie Knows, MD;  Location: [facility] ORS;  Service: Orthopedics;  Laterality: Right;   HERNIA REPAIR     HIATAL HERNIA REPAIR N/A 05/31/2021   Procedure: LAPAROSCOPIC REPAIR OF HIATAL HERNIA WITH LAP BAND REMOVAL;  Surgeon: Kieth Brightly Arta Bruce, MD;  Location: [facility] ORS;  Service: General;  Laterality: N/A;   JOINT REPLACEMENT     KNEE ARTHROSCOPY Bilateral [id number]   LAPAROSCOPIC GASTRIC BANDING  2009   and hernia repair   LAPAROSCOPIC REPAIR OF HIATAL HERNIA WITH LAP BAND REMOVAL   05/31/2021   RADIATION IMPLANT W/ ULTRASOUND, MALE  2005   internal/external radiation for prostate cancer   TOTAL KNEE ARTHROPLASTY Left 10/12/2015   Procedure: TOTAL KNEE ARTHROPLASTY;  Surgeon: Hessie Knows, MD;  Location: [facility] ORS;  Service: Orthopedics;  Laterality: Left;   TOTAL KNEE ARTHROPLASTY Right 05/15/2017   Procedure: TOTAL KNEE ARTHROPLASTY;  Surgeon: Hessie Knows, MD;  Location: [facility] ORS;  Service: Orthopedics;  Laterality: Right;   UMBILICAL HERNIA REPAIR  2010    Family History  Problem Relation Age of Onset   Diabetes Mother    Arthritis Mother    Obesity Mother    Parkinson's disease Father    Hypertension Brother    Arthritis Brother    Asthma Brother    Diabetes Son        type 1   Heart disease Maternal Grandmother        CHF   Stroke  Maternal Grandfather    Heart disease Paternal Grandmother        CHF   Stroke Paternal Grandfather     Social History   Socioeconomic History   Marital status: Married    Spouse name: Not on file   Number of children: Not on file   Years of education: Not on file   Highest education level: Not on file  Occupational History   Occupation: pastor   Tobacco Use   Smoking status: Never   Smokeless tobacco: Never  Vaping Use   Vaping Use: Never used  Substance and Sexual Activity   Alcohol use: Not Currently    Comment: quit using alcohol upon entering ministry 1977   Drug use: Never   Sexual activity: Not Currently    Birth control/protection: None  Other Topics Concern   Not on file  Social History Narrative   Not on file   Social Determinants of Health   Financial Resource Strain: Low Risk    Difficulty of Paying Living Expenses: Not hard at all  Food Insecurity: No Food Insecurity   Worried About Charity fundraiser in the Last Year: Never true   [place] in the Last Year: Never true  Transportation Needs: No Transportation Needs   Lack of Transportation (Medical): No   Lack of Transportation (Non-Medical): No  Physical Activity: Inactive   Days of Exercise per Week: 0 days   Minutes of Exercise per Session: 0 min  Stress: No Stress Concern Present   Feeling of Stress : Not at all  Social Connections: Not on file  Intimate Partner Violence: Not on file    Outpatient Medications Prior to Visit  Medication Sig Dispense Refill   acetaminophen (TYLENOL) 325 MG tablet Take 650 mg by mouth every 6 (six) hours as needed for mild pain.     amLODipine (NORVASC) 10 MG tablet Take 1 tablet (10 mg total) by mouth daily. 90 tablet 4   celecoxib (CELEBREX) 200 MG capsule Take 1 capsule (200 mg total) by mouth daily. 90 capsule 3   Cholecalciferol 25 MCG (1000 UT) capsule Take 1,000 Units by mouth daily.     ketoconazole (NIZORAL) 2 % cream Apply 1 application  topically daily as needed. 15 g 3   lisinopril-hydrochlorothiazide (ZESTORETIC) 20-25 MG tablet Take 1 tablet by mouth once daily 90 tablet 1   Multiple Vitamin (MULTIVITAMIN) capsule Take 1 capsule by mouth daily.     neomycin-polymyxin-hydrocortisone (CORTISPORIN) OTIC solution Place 3 drops into both ears 3 (three) times daily. 10 mL 2   omeprazole (PRILOSEC) 20 MG capsule Take 1 capsule by mouth once daily 90 capsule 4   rosuvastatin (CRESTOR) 10 MG tablet Take one tablet (10 MG) by mouth three times a week. 36 tablet  4   triamcinolone (KENALOG) 0.025 % cream Apply 1 application topically 2 (two) times daily as needed (skin rash). 30 g 2   XARELTO 20 MG TABS tablet Take 20 mg by mouth daily.     No facility-administered medications prior to visit.    Allergies  Allergen Reactions   Adhesive [Tape] Rash    "whelps", latex adhesive tape only, paper tape is ok     Review of Systems  Constitutional:  Positive for fatigue. Negative for fever.  HENT:  Positive for rhinorrhea.   Respiratory:  Positive for cough.   Gastrointestinal:  Positive for diarrhea.       Dark stools  Genitourinary: Negative.   Musculoskeletal: Negative.   Skin: Negative.   Neurological:  Positive for dizziness and light-headedness.      Objective:    Physical Exam Vitals and nursing note reviewed.  Constitutional:      Appearance: Normal appearance.  HENT:     Head: Normocephalic.  Eyes:     Conjunctiva/sclera: Conjunctivae normal.  Cardiovascular:     Rate and Rhythm: Normal rate and regular rhythm.     Pulses: Normal pulses.     Heart sounds: Normal heart sounds.  Pulmonary:     Effort: Pulmonary effort is normal.     Breath sounds: Normal breath sounds.  Abdominal:     Palpations: Abdomen is soft.     Tenderness: There is no abdominal tenderness.  Musculoskeletal:     Cervical back: Normal range of motion.  Skin:    General: Skin is warm.  Neurological:     General: No focal deficit  present.     Mental Status: He is alert and oriented to person, place, and time.  Psychiatric:        Mood and Affect: Mood normal.        Behavior: Behavior normal.        Thought Content: Thought content normal.        Judgment: Judgment normal.    BP (!) 144/77    Pulse 64    Temp 98.7 F (37.1 C) (Oral)    Wt 255 lb 9.6 oz (115.9 kg)    SpO2 98%    BMI 38.86 kg/m  Wt Readings from Last 3 Encounters:  10/11/21 255 lb 9.6 oz (115.9 kg)  08/09/21 259 lb (117.5 kg)  08/01/21 258 lb 6.4 oz (117.2 kg)    Health Maintenance Due  Topic Date Due   COVID-19 Vaccine (3 - Booster for Moderna series) 03/10/2020    There are no preventive care reminders to display for this patient.   Lab Results  Component Value Date   TSH 1.420 11/24/2020   Lab Results  Component Value Date   WBC 5.8 08/01/2021   HGB 13.4 08/01/2021   HCT 39.1 08/01/2021   MCV 91 08/01/2021   PLT 190 08/01/2021   Lab Results  Component Value Date   NA 138 08/01/2021   K 3.8 08/01/2021   CO2 24 08/01/2021   GLUCOSE 83 08/01/2021   BUN 20 08/01/2021   CREATININE 0.82 08/01/2021   BILITOT 0.6 08/01/2021   ALKPHOS 82 08/01/2021   AST 22 08/01/2021   ALT 13 08/01/2021   PROT 6.4 08/01/2021   ALBUMIN 4.3 08/01/2021   CALCIUM 9.2 08/01/2021   ANIONGAP 8 06/01/2021   EGFR 94 08/01/2021   Lab Results  Component Value Date   CHOL 129 08/01/2021   Lab Results  Component Value Date   HDL  45 08/01/2021   Lab Results  Component Value Date   LDLCALC 71 08/01/2021   Lab Results  Component Value Date   TRIG 58 08/01/2021   No results found for: [facility] Lab Results  Component Value Date   HGBA1C 5.3 08/01/2021       Assessment & Plan:   Problem List Items Addressed This Visit       Other   Dark stools - Primary    VSS. Hgb in office 13.9. No red flags on exam. Check hemoccult, CMP. Encouraged him to reach out to Dr. Michele Mcalpine office to schedule an appointment, phone number provided on AVS.  ER precautions discussed. Keep next scheduled appointment with PCP.       Relevant Orders   CBC With Differential/Platelet   Comp Met (CMET)   Fecal occult blood, imunochemical   CBC with Differential     No orders of the defined types were placed in this encounter.    Charyl Dancer, NP

## 2021-10-12 LAB — COMPREHENSIVE METABOLIC PANEL
ALT: 42 IU/L (ref 0–44)
AST: 37 IU/L (ref 0–40)
Albumin/Globulin Ratio: 1.4 (ref 1.2–2.2)
Albumin: 3.8 g/dL (ref 3.7–4.7)
Alkaline Phosphatase: 158 IU/L — ABNORMAL HIGH (ref 44–121)
BUN/Creatinine Ratio: 25 — ABNORMAL HIGH (ref 10–24)
BUN: 26 mg/dL (ref 8–27)
Bilirubin Total: 0.3 mg/dL (ref 0.0–1.2)
CO2: 27 mmol/L (ref 20–29)
Calcium: 8.7 mg/dL (ref 8.6–10.2)
Chloride: 102 mmol/L (ref 96–106)
Creatinine, Ser: 1.03 mg/dL (ref 0.76–1.27)
Globulin, Total: 2.8 g/dL (ref 1.5–4.5)
Glucose: 89 mg/dL (ref 70–99)
Potassium: 3 mmol/L — ABNORMAL LOW (ref 3.5–5.2)
Sodium: 141 mmol/L (ref 134–144)
Total Protein: 6.6 g/dL (ref 6.0–8.5)
eGFR: 77 mL/min/{1.73_m2} (ref >59)

## 2021-10-12 LAB — CBC WITH DIFFERENTIAL/PLATELET
Basophils Absolute: 0 10*3/uL (ref 0.0–0.2)
Basos: 1 %
EOS (ABSOLUTE): 0.5 10*3/uL — ABNORMAL HIGH (ref 0.0–0.4)
Eos: 13 %
Hematocrit: 38.6 % (ref 37.5–51.0)
Hemoglobin: 13.2 g/dL (ref 13.0–17.7)
Immature Grans (Abs): 0 10*3/uL (ref 0.0–0.1)
Immature Granulocytes: 0 %
Lymphocytes Absolute: 1.2 10*3/uL (ref 0.7–3.1)
Lymphs: 33 %
MCH: 30.3 pg (ref 26.6–33.0)
MCHC: 34.2 g/dL (ref 31.5–35.7)
MCV: 89 fL (ref 79–97)
Monocytes Absolute: 0.5 10*3/uL (ref 0.1–0.9)
Monocytes: 13 %
Neutrophils Absolute: 1.5 10*3/uL (ref 1.4–7.0)
Neutrophils: 40 %
Platelets: 137 10*3/uL — ABNORMAL LOW (ref 150–450)
RBC: 4.36 x10E6/uL (ref 4.14–5.80)
RDW: 12.4 % (ref 11.6–15.4)
WBC: 3.7 10*3/uL (ref 3.4–10.8)

## 2021-10-13 ENCOUNTER — Ambulatory Visit: Payer: Medicare PPO | Admitting: Nurse Practitioner

## 2021-10-13 LAB — FECAL OCCULT BLOOD, IMMUNOCHEMICAL: Fecal Occult Bld: NEGATIVE

## 2021-10-23 ENCOUNTER — Other Ambulatory Visit: Payer: Self-pay | Admitting: Nurse Practitioner

## 2021-10-25 NOTE — Telephone Encounter (Signed)
Requested Prescriptions  Pending Prescriptions Disp Refills   celecoxib (CELEBREX) 200 MG capsule [Pharmacy Med Name: Celecoxib 200 MG Oral Capsule] 90 capsule 0    Sig: Take 1 capsule by mouth once daily     Analgesics:  COX2 Inhibitors Passed - 10/23/2021  2:43 PM      Passed - HGB in normal range and within 360 days    Hemoglobin  Date Value Ref Range Status  10/11/2021 13.2 13.0 - 17.7 g/dL Final         Passed - Cr in normal range and within 360 days    Creatinine, Ser  Date Value Ref Range Status  10/11/2021 1.03 0.76 - 1.27 mg/dL Final         Passed - Patient is not pregnant      Passed - Valid encounter within last 12 months    Recent Outpatient Visits          2 weeks ago Dark stools   [place], NP   2 months ago Paroxysmal atrial fibrillation ([place])   [place], [place] T, NP   7 months ago Mixed hyperlipidemia   [place], [place] T, NP   9 months ago Elevated amylase   [place], [place] T, NP   11 months ago Essential hypertension, benign   [place], Barbaraann Faster, NP      Future Appointments            In 1 month [place], Barbaraann Faster, NP [employer]   In 2 months Ralene Bathe, MD [place]   In 5 months  [facility], [place]

## 2021-11-10 ENCOUNTER — Ambulatory Visit: Payer: Medicare PPO | Admitting: Dermatology

## 2021-11-19 DIAGNOSIS — D696 Thrombocytopenia, unspecified: Secondary | ICD-10-CM | POA: Insufficient documentation

## 2021-11-25 ENCOUNTER — Other Ambulatory Visit: Payer: Self-pay

## 2021-11-25 ENCOUNTER — Encounter: Payer: Self-pay | Admitting: Nurse Practitioner

## 2021-11-25 ENCOUNTER — Ambulatory Visit (INDEPENDENT_AMBULATORY_CARE_PROVIDER_SITE_OTHER): Payer: Medicare PPO | Admitting: Nurse Practitioner

## 2021-11-25 VITALS — BP 138/84 | HR 58 | Temp 98.3°F | Resp 19 | Ht 68.25 in | Wt 265.6 lb

## 2021-11-25 DIAGNOSIS — D692 Other nonthrombocytopenic purpura: Secondary | ICD-10-CM | POA: Insufficient documentation

## 2021-11-25 DIAGNOSIS — D6869 Other thrombophilia: Secondary | ICD-10-CM | POA: Insufficient documentation

## 2021-11-25 DIAGNOSIS — I480 Paroxysmal atrial fibrillation: Secondary | ICD-10-CM | POA: Diagnosis not present

## 2021-11-25 DIAGNOSIS — E782 Mixed hyperlipidemia: Secondary | ICD-10-CM | POA: Diagnosis not present

## 2021-11-25 DIAGNOSIS — G4733 Obstructive sleep apnea (adult) (pediatric): Secondary | ICD-10-CM

## 2021-11-25 DIAGNOSIS — E669 Obesity, unspecified: Secondary | ICD-10-CM

## 2021-11-25 DIAGNOSIS — I10 Essential (primary) hypertension: Secondary | ICD-10-CM | POA: Diagnosis not present

## 2021-11-25 DIAGNOSIS — D696 Thrombocytopenia, unspecified: Secondary | ICD-10-CM

## 2021-11-25 DIAGNOSIS — Z8546 Personal history of malignant neoplasm of prostate: Secondary | ICD-10-CM | POA: Diagnosis not present

## 2021-11-25 DIAGNOSIS — Z8719 Personal history of other diseases of the digestive system: Secondary | ICD-10-CM

## 2021-11-25 DIAGNOSIS — Z9889 Other specified postprocedural states: Secondary | ICD-10-CM

## 2021-11-25 DIAGNOSIS — Z86711 Personal history of pulmonary embolism: Secondary | ICD-10-CM

## 2021-11-25 DIAGNOSIS — E559 Vitamin D deficiency, unspecified: Secondary | ICD-10-CM | POA: Diagnosis not present

## 2021-11-25 DIAGNOSIS — Z0000 Encounter for general adult medical examination without abnormal findings: Secondary | ICD-10-CM

## 2021-11-25 DIAGNOSIS — K2270 Barrett's esophagus without dysplasia: Secondary | ICD-10-CM | POA: Diagnosis not present

## 2021-11-25 LAB — MICROALBUMIN, URINE WAIVED
Creatinine, Urine Waived: 50 mg/dL (ref 10–300)
Microalb, Ur Waived: 10 mg/L (ref 0–19)

## 2021-11-25 MED ORDER — ROSUVASTATIN CALCIUM 10 MG PO TABS: ORAL_TABLET | ORAL | 4 refills | Status: DC

## 2021-11-25 MED ORDER — OMEPRAZOLE 20 MG PO CPDR: 20.0000 mg | DELAYED_RELEASE_CAPSULE | Freq: Every day | ORAL | 4 refills | Status: DC

## 2021-11-25 MED ORDER — LISINOPRIL-HYDROCHLOROTHIAZIDE 20-25 MG PO TABS: 1.0000 | ORAL_TABLET | Freq: Every day | ORAL | 4 refills | Status: DC

## 2021-11-25 NOTE — Assessment & Plan Note (Signed)
Chronic, ongoing noted on EGD in 2022.   Continue current medication regimen and adjust as needed, lifelong PPI.  Mag level today.

## 2021-11-25 NOTE — Assessment & Plan Note (Signed)
Noted on exam, discussed with patient.  Ensure gentle skin care and monitor for wounds, alert provider if they present. 

## 2021-11-25 NOTE — Assessment & Plan Note (Signed)
Noted on labs October 2022 -- recheck CBC today and monitor closely.

## 2021-11-25 NOTE — Assessment & Plan Note (Signed)
Performed on 05/31/21 -- healed well.  Check CMP today.

## 2021-11-25 NOTE — Assessment & Plan Note (Signed)
Stable.  PSA today. Continue to collaborate with Dr. Bernardo Heater with care and future needs.

## 2021-11-25 NOTE — Assessment & Plan Note (Signed)
Recommended continue daily aspirin. Continue to collaborate with Dr. Humphrey Rolls for care and future needs.

## 2021-11-25 NOTE — Assessment & Plan Note (Signed)
Diagnosed by cardiology. Dr. Humphrey Rolls, prior to surgery 05/31/21.  No current BB or rate control.  Continue Xarelto as ordered.  Recent note reviewed, patient brought to office for provider.

## 2021-11-25 NOTE — Progress Notes (Signed)
BP 138/84 (BP Location: Left Arm, Patient Position: Sitting, Cuff Size: Large)    Pulse (!) 58    Temp 98.3 F (36.8 C) (Oral)    Resp 19    Ht 5' 8.25" (1.734 m)    Wt 265 lb 9.6 oz (120.5 kg)    SpO2 100%    BMI 40.09 kg/m    Subjective:    Patient ID: Grant Diaz, male    DOB: 03-06-1951, 71 y.o.   MRN: [id number]  HPI: Grant Diaz is a 71 y.o. male presenting on 11/25/2021 for comprehensive medical examination. Current medical complaints include:none.  He currently lives with: wife Interim Problems from his last visit: no  HYPERTENSION & A - FIB Saw Dr. Humphrey Rolls 11/22/21 for cardiology visit, he decreased Amlodipine to 5 MG (higher doses caused dizziness and fatigue) -- continues on Zestoretic, Xarelto, and Crestor.  Echo 11/17/21 -- noted trace MR/TR and LVEF 56%. Hypertension status: controlled  Satisfied with current treatment? yes Duration of hypertension: chronic BP monitoring frequency: rarely BP range: 140/80 average BP medication side effects:  no Medication compliance: good compliance Aspirin: yes Recurrent headaches: no Visual changes: no Palpitations: no Dyspnea: no Chest pain: no Lower extremity edema: no Dizzy/lightheaded: no  The ASCVD Risk score (Arnett DK, et al., 2019) failed to calculate for the following reasons:   The valid total cholesterol range is 130 to 320 mg/dL  GERD Had history of pancreatitis.  Has had EGD in past with hiatal hernia. Had repair of this on 05/31/21 with lap band removal.  He is now struggling with weight gain and wanting to lose weight -- has gained since lap band removal.   Taking Prilosec every day which is helping.  In 2010 CT showed possible pancreatic mass, but MRI showed no mass. Continues on Omeprazole.  Last saw GI 08/09/21. GERD control status: controlled  Satisfied with current treatment? yes Heartburn frequency: 2 times a month Medication side effects: no  Medication compliance: stable Previous GERD  medications: Antacid use frequency: occasional Aggravating factors: Larger meals, greasy food Dysphagia: no  Odynophagia:  no Hematemesis: no Blood in stool: no EGD: yes   SLEEP APNEA Endorses using CPAP every night and tolerating well. Denies insomnia, restless sleep, daytime sleepiness, depression, or confusion.    Hx PROSTATE CANCER Followed at [facility] by Dr. Bernardo Heater for hx of prostate cancer. Saw him last in September 2021, does not need to return unless needed.  Current plan is to watch closely.  Urology notes review, patient requests PSA in house today and to forward result to Dr. Bernardo Heater.  Hx PULMONARY EMBOLUS Followed by Dr. Humphrey Rolls, for hx of pulmonary embolism. Currently taking 13m ASA. Endorses wearing compression stockings daily and avoidance of sitting for long periods of time. Denies any current concerns or issues.  Functional Status Survey: Is the patient deaf or have difficulty hearing?: No Does the patient have difficulty seeing, even when wearing glasses/contacts?: No Does the patient have difficulty concentrating, remembering, or making decisions?: No Does the patient have difficulty walking or climbing Diaz?: No Does the patient have difficulty dressing or bathing?: No Does the patient have difficulty doing errands alone such as visiting a doctor's office or shopping?: No  FALL RISK: Fall Risk  11/25/2021 10/11/2021 04/15/2021 11/24/2020 10/13/2020  Falls in the past year? 0 0 0 0 0  Number falls in past yr: 0 0 - 0 0  Injury with Fall? 0 0 - - 0  Risk for fall  due to : No Fall Risks No Fall Risks Medication side effect - -  Follow up Falls prevention discussed Falls evaluation completed Falls evaluation completed;Education provided;Falls prevention discussed - -   Depression Screen Depression screen [facility] 2/9 11/25/2021 10/11/2021 04/15/2021 11/24/2020 10/13/2020  Decreased Interest 0 0 0 0 0  Down, Depressed, Hopeless 0 0 0 0 0  PHQ - 2 Score 0 0 0 0 0   Altered sleeping 0 0 - 0 -  Tired, decreased energy 1 1 - 0 -  Change in appetite 1 1 - 0 -  Feeling bad or failure about yourself  0 0 - 0 -  Trouble concentrating 0 0 - 0 -  Moving slowly or fidgety/restless 0 0 - 0 -  Suicidal thoughts 0 0 - 0 -  PHQ-9 Score 2 2 - 0 -  Difficult doing work/chores Not difficult at all Not difficult at all - - -     Past Medical History:  Past Medical History:  Diagnosis Date   Arthritis    Atrial fibrillation ([place])    Barrett's esophagus    Cancer ([place]) 2008   Prostate-radiation   Cataract 2019   not yet significant enough to warrant surgery   Dysrhythmia    Afib   GERD (gastroesophageal reflux disease)    History of DVT (deep vein thrombosis)    History of hiatal hernia    History of kidney stones    Hypertension    Joint pain    Mixed hyperlipidemia    Pancreatitis 2013   [facility]   Prostate cancer [facility])    Pulmonary embolus ([place]) 12/2014   Bilateral pulmonary embolus, [facility]   Sleep apnea 2010   use CPAP regularly   Sleep apnea, obstructive    cpap    Surgical History:  Past Surgical History:  Procedure Laterality Date   CARPAL TUNNEL RELEASE Right 03/15/2017   Procedure: CARPAL TUNNEL RELEASE;  Surgeon: Hessie Knows, MD;  Location: [facility] ORS;  Service: Orthopedics;  Laterality: Right;   CHOLECYSTECTOMY     COLONOSCOPY WITH PROPOFOL N/A 04/02/2018   Procedure: COLONOSCOPY WITH PROPOFOL;  Surgeon: Lucilla Lame, MD;  Location: [facility] ENDOSCOPY;  Service: Endoscopy;  Laterality: N/A;   ESOPHAGOGASTRODUODENOSCOPY N/A 05/28/2013   Procedure: ESOPHAGOGASTRODUODENOSCOPY (EGD);  Surgeon: Shann Medal, MD;  Location: [facility] ENDOSCOPY;  Service: General;  Laterality: N/A;   ESOPHAGOGASTRODUODENOSCOPY (EGD) WITH PROPOFOL N/A 02/08/2021   Procedure: ESOPHAGOGASTRODUODENOSCOPY (EGD) WITH PROPOFOL;  Surgeon: Virgel Manifold, MD;  Location: [facility] ENDOSCOPY;  Service: Endoscopy;  Laterality: N/A;   GALLBLADDER SURGERY  2003   GANGLION CYST  EXCISION Right 03/15/2017   Procedure: REMOVAL GANGLION OF WRIST;  Surgeon: Hessie Knows, MD;  Location: [facility] ORS;  Service: Orthopedics;  Laterality: Right;   HERNIA REPAIR     HIATAL HERNIA REPAIR N/A 05/31/2021   Procedure: LAPAROSCOPIC REPAIR OF HIATAL HERNIA WITH LAP BAND REMOVAL;  Surgeon: Kieth Brightly Arta Bruce, MD;  Location: [facility] ORS;  Service: General;  Laterality: N/A;   JOINT REPLACEMENT     KNEE ARTHROSCOPY Bilateral [id number]   LAPAROSCOPIC GASTRIC BANDING  2009   and hernia repair   LAPAROSCOPIC REPAIR OF HIATAL HERNIA WITH LAP BAND REMOVAL   05/31/2021   RADIATION IMPLANT W/ ULTRASOUND, MALE  2005   internal/external radiation for prostate cancer   TOTAL KNEE ARTHROPLASTY Left 10/12/2015   Procedure: TOTAL KNEE ARTHROPLASTY;  Surgeon: Hessie Knows, MD;  Location: [facility] ORS;  Service: Orthopedics;  Laterality: Left;   TOTAL KNEE ARTHROPLASTY  Right 05/15/2017   Procedure: TOTAL KNEE ARTHROPLASTY;  Surgeon: Hessie Knows, MD;  Location: [facility] ORS;  Service: Orthopedics;  Laterality: Right;   UMBILICAL HERNIA REPAIR  2010    Medications:  Current Outpatient Medications on File Prior to Visit  Medication Sig   acetaminophen (TYLENOL) 325 MG tablet Take 650 mg by mouth every 6 (six) hours as needed for mild pain.   amLODipine (NORVASC) 5 MG tablet Take by mouth.   celecoxib (CELEBREX) 200 MG capsule Take 1 capsule by mouth once daily   Cholecalciferol 25 MCG (1000 UT) capsule Take 1,000 Units by mouth daily.   ketoconazole (NIZORAL) 2 % cream Apply 1 application topically daily as needed.   Multiple Vitamin (MULTIVITAMIN) capsule Take 1 capsule by mouth daily.   neomycin-polymyxin-hydrocortisone (CORTISPORIN) OTIC solution Place 3 drops into both ears 3 (three) times daily.   triamcinolone (KENALOG) 0.025 % cream Apply 1 application topically 2 (two) times daily as needed (skin rash).   XARELTO 20 MG TABS tablet Take 20 mg by mouth daily.   No current facility-administered  medications on file prior to visit.    Allergies:  Allergies  Allergen Reactions   Adhesive [Tape] Rash    "whelps", latex adhesive tape only, paper tape is ok     Social History:  Social History   Socioeconomic History   Marital status: Married    Spouse name: Not on file   Number of children: Not on file   Years of education: Not on file   Highest education level: Not on file  Occupational History   Occupation: pastor   Tobacco Use   Smoking status: Never   Smokeless tobacco: Never  Vaping Use   Vaping Use: Never used  Substance and Sexual Activity   Alcohol use: Not Currently    Comment: quit using alcohol upon entering ministry 1977   Drug use: Never   Sexual activity: Not Currently    Birth control/protection: None  Other Topics Concern   Not on file  Social History Narrative   Not on file   Social Determinants of Health   Financial Resource Strain: Low Risk    Difficulty of Paying Living Expenses: Not hard at all  Food Insecurity: No Food Insecurity   Worried About Charity fundraiser in the Last Year: Never true   [place] in the Last Year: Never true  Transportation Needs: No Transportation Needs   Lack of Transportation (Medical): No   Lack of Transportation (Non-Medical): No  Physical Activity: Inactive   Days of Exercise per Week: 0 days   Minutes of Exercise per Session: 0 min  Stress: No Stress Concern Present   Feeling of Stress : Not at all  Social Connections: Not on file  Intimate Partner Violence: Not on file   Social History   Tobacco Use  Smoking Status Never  Smokeless Tobacco Never   Social History   Substance and Sexual Activity  Alcohol Use Not Currently   Comment: quit using alcohol upon entering ministry 1977    Family History:  Family History  Problem Relation Age of Onset   Diabetes Mother    Arthritis Mother    Obesity Mother    Parkinson's disease Father    Hypertension Brother    Arthritis Brother     Asthma Brother    Diabetes Son        type 1   Heart disease Maternal Grandmother        CHF  Stroke Maternal Grandfather    Heart disease Paternal Grandmother        CHF   Stroke Paternal Grandfather     Past medical history, surgical history, medications, allergies, family history and social history reviewed with patient today and changes made to appropriate areas of the chart.   Review of Systems - All other ROS negative except what is listed above and in the HPI.      Objective:    BP 138/84 (BP Location: Left Arm, Patient Position: Sitting, Cuff Size: Large)    Pulse (!) 58    Temp 98.3 F (36.8 C) (Oral)    Resp 19    Ht 5' 8.25" (1.734 m)    Wt 265 lb 9.6 oz (120.5 kg)    SpO2 100%    BMI 40.09 kg/m   Wt Readings from Last 3 Encounters:  11/25/21 265 lb 9.6 oz (120.5 kg)  10/11/21 255 lb 9.6 oz (115.9 kg)  08/09/21 259 lb (117.5 kg)    Physical Exam Vitals and nursing note reviewed.  Constitutional:      General: He is awake. He is not in acute distress.    Appearance: He is well-developed and well-groomed. He is obese. He is not ill-appearing.  HENT:     Head: Normocephalic and atraumatic.     Right Ear: Hearing, tympanic membrane, ear canal and external ear normal. No drainage.     Left Ear: Hearing, tympanic membrane, ear canal and external ear normal. No drainage.     Nose: Nose normal.     Mouth/Throat:     Pharynx: Uvula midline.  Eyes:     General: Lids are normal.        Right eye: No discharge.        Left eye: No discharge.     Extraocular Movements: Extraocular movements intact.     Conjunctiva/sclera: Conjunctivae normal.     Pupils: Pupils are equal, round, and reactive to light.     Visual Fields: Right eye visual fields normal and left eye visual fields normal.  Neck:     Thyroid: No thyromegaly.     Vascular: No carotid bruit or JVD.     Trachea: Trachea normal.  Cardiovascular:     Rate and Rhythm: Normal rate and regular rhythm.     Heart  sounds: Normal heart sounds, S1 normal and S2 normal. No murmur heard.   No gallop.  Pulmonary:     Effort: Pulmonary effort is normal. No accessory muscle usage or respiratory distress.     Breath sounds: Normal breath sounds.  Abdominal:     General: Bowel sounds are normal.     Palpations: Abdomen is soft. There is no hepatomegaly or splenomegaly.     Tenderness: There is no abdominal tenderness.  Musculoskeletal:        General: Normal range of motion.     Cervical back: Normal range of motion and neck supple.     Right lower leg: No edema.     Left lower leg: No edema.  Lymphadenopathy:     Head:     Right side of head: No submental, submandibular, tonsillar, preauricular or posterior auricular adenopathy.     Left side of head: No submental, submandibular, tonsillar, preauricular or posterior auricular adenopathy.     Cervical: No cervical adenopathy.  Skin:    General: Skin is warm and dry.     Capillary Refill: Capillary refill takes less than 2 seconds.  Findings: Bruising present. No rash.     Comments: Scattered purple bruises to upper extremities.  Neurological:     Mental Status: He is alert and oriented to person, place, and time.     Gait: Gait is intact.     Deep Tendon Reflexes: Reflexes are normal and symmetric.     Reflex Scores:      Brachioradialis reflexes are 2+ on the right side and 2+ on the left side.      Patellar reflexes are 2+ on the right side and 2+ on the left side. Psychiatric:        Attention and Perception: Attention normal.        Mood and Affect: Mood normal.        Speech: Speech normal.        Behavior: Behavior normal. Behavior is cooperative.        Thought Content: Thought content normal.        Cognition and Memory: Cognition normal.        Judgment: Judgment normal.   Results for orders placed or performed in visit on 10/11/21  Fecal occult blood, imunochemical   Specimen: Stool   ST  Result Value Ref Range   Fecal Occult  Bld Negative Negative  Comp Met (CMET)  Result Value Ref Range   Glucose 89 70 - 99 mg/dL   BUN 26 8 - 27 mg/dL   Creatinine, Ser 1.03 0.76 - 1.27 mg/dL   eGFR 77 >59 mL/min/1.73   BUN/Creatinine Ratio 25 (H) 10 - 24   Sodium 141 134 - 144 mmol/L   Potassium 3.0 (L) 3.5 - 5.2 mmol/L   Chloride 102 96 - 106 mmol/L   CO2 27 20 - 29 mmol/L   Calcium 8.7 8.6 - 10.2 mg/dL   Total Protein 6.6 6.0 - 8.5 g/dL   Albumin 3.8 3.7 - 4.7 g/dL   Globulin, Total 2.8 1.5 - 4.5 g/dL   Albumin/Globulin Ratio 1.4 1.2 - 2.2   Bilirubin Total 0.3 0.0 - 1.2 mg/dL   Alkaline Phosphatase 158 (H) 44 - 121 IU/L   AST 37 0 - 40 IU/L   ALT 42 0 - 44 IU/L  CBC with Differential  Result Value Ref Range   WBC 3.7 3.4 - 10.8 x10E3/uL   RBC 4.36 4.14 - 5.80 x10E6/uL   Hemoglobin 13.2 13.0 - 17.7 g/dL   Hematocrit 38.6 37.5 - 51.0 %   MCV 89 79 - 97 fL   MCH 30.3 26.6 - 33.0 pg   MCHC 34.2 31.5 - 35.7 g/dL   RDW 12.4 11.6 - 15.4 %   Platelets 137 (L) 150 - 450 x10E3/uL   Neutrophils 40 Not Estab. %   Lymphs 33 Not Estab. %   Monocytes 13 Not Estab. %   Eos 13 Not Estab. %   Basos 1 Not Estab. %   Neutrophils Absolute 1.5 1.4 - 7.0 x10E3/uL   Lymphocytes Absolute 1.2 0.7 - 3.1 x10E3/uL   Monocytes Absolute 0.5 0.1 - 0.9 x10E3/uL   EOS (ABSOLUTE) 0.5 (H) 0.0 - 0.4 x10E3/uL   Basophils Absolute 0.0 0.0 - 0.2 x10E3/uL   Immature Granulocytes 0 Not Estab. %   Immature Grans (Abs) 0.0 0.0 - 0.1 x10E3/uL      Assessment & Plan:   Problem List Items Addressed This Visit       Cardiovascular and Mediastinum   Atrial fibrillation ([place]) - Primary    Diagnosed by cardiology. Dr. Humphrey Rolls, prior to surgery  05/31/21.  No current BB or rate control.  Continue Xarelto as ordered.  Recent note reviewed, patient brought to office for provider.      Relevant Medications   amLODipine (NORVASC) 5 MG tablet   lisinopril-hydrochlorothiazide (ZESTORETIC) 20-25 MG tablet   rosuvastatin (CRESTOR) 10 MG tablet   Other  Relevant Orders   Microalbumin, Urine Waived   CBC with Differential/Platelet   Comprehensive metabolic panel   Essential hypertension, benign    Chronic, ongoing with repeat improved. Recommend he monitor BP at least a few mornings a week at home and document.  DASH diet at home.  Continue current medication regimen and adjust as needed.  Labs today: CBC, CMP, TSH, urine ALB.  Return in 6 months.       Relevant Medications   amLODipine (NORVASC) 5 MG tablet   lisinopril-hydrochlorothiazide (ZESTORETIC) 20-25 MG tablet   rosuvastatin (CRESTOR) 10 MG tablet   Other Relevant Orders   Microalbumin, Urine Waived   CBC with Differential/Platelet   Comprehensive metabolic panel   TSH   Senile purpura ([place])    Noted on exam, discussed with patient.  Ensure gentle skin care and monitor for wounds, alert provider if they present.      Relevant Medications   amLODipine (NORVASC) 5 MG tablet   lisinopril-hydrochlorothiazide (ZESTORETIC) 20-25 MG tablet   rosuvastatin (CRESTOR) 10 MG tablet     Respiratory   Sleep apnea    Chronic, stable. Continue with CPAP QHS, 100% use        Digestive   Barrett esophagus    Chronic, ongoing noted on EGD in 2022.   Continue current medication regimen and adjust as needed, lifelong PPI.  Mag level today.      Relevant Orders   Magnesium     Hematopoietic and Hemostatic   Other thrombophilia ([place])    Due to A-Fib and Xarelto.  CBC annually and monitor for bleeding.      Thrombocytopenia ([place])    Noted on labs October 2022 -- recheck CBC today and monitor closely.        Other   History of prostate cancer    Stable.  PSA today. Continue to collaborate with Dr. Bernardo Heater with care and future needs.       Relevant Orders   PSA   History of pulmonary embolism    Recommended continue daily aspirin. Continue to collaborate with Dr. Humphrey Rolls for care and future needs.       History of repair of hiatal hernia    Performed on 05/31/21 -- healed  well.  Check CMP today.      Mixed hyperlipidemia    Chronic, ongoing.  Poor tolerance of daily Crestor at low dose.  Will continue 3 day a week dosing at this time.  Lipid panel today.  Educated patient on statin use and side effects.      Relevant Medications   amLODipine (NORVASC) 5 MG tablet   lisinopril-hydrochlorothiazide (ZESTORETIC) 20-25 MG tablet   rosuvastatin (CRESTOR) 10 MG tablet   Other Relevant Orders   Comprehensive metabolic panel   Lipid Panel w/o Chol/HDL Ratio   Morbid obesity ([facility])    BMI 40.09 with A-fib, HTN/HLD.  Recommended eating smaller high protein, low fat meals more frequently and exercising 30 mins a day 5 times a week with a goal of 10-15lb weight loss in the next 3 months. Patient voiced their understanding and motivation to adhere to these recommendations.  He will think about referral to [facility]  weight management.  Can not use GLP1 due to past pancreatitis issues.       Other Visit Diagnoses     Vitamin D deficiency       History of low levels, continue supplement and check on labs today.   Relevant Orders   VITAMIN D 25 Hydroxy (Vit-D Deficiency, Fractures)   Encounter for annual physical exam       Annual physical today with labs and health maintenance reviewed.        Discussed aspirin prophylaxis for myocardial infarction prevention and decision was made to continue ASA  LABORATORY TESTING:  Health maintenance labs ordered today as discussed above. PSA on labs today and will alert urology to level.  IMMUNIZATIONS:   - Tdap: Tetanus vaccination status reviewed: Administered today. - Influenza: Up to date - Pneumovax: Up to Date - Prevnar: Up to date - Zostavax vaccine: Up To Date - Covid Vaccines -- has everything but recent booster  SCREENING: - Colonoscopy: Up To Date -- next 04/03/2023 Discussed with patient purpose of the colonoscopy is to detect colon cancer at curable precancerous or early stages   - AAA Screening: Not  applicable  -Hearing Test: Not applicable  -Spirometry: Not applicable   PATIENT COUNSELING:    Sexuality: Discussed sexually transmitted diseases, partner selection, use of condoms, avoidance of unintended pregnancy  and contraceptive alternatives.   Advised to avoid cigarette smoking.  I discussed with the patient that most people either abstain from alcohol or drink within safe limits (<=14/week and <=4 drinks/occasion for males, <=7/weeks and <= 3 drinks/occasion for females) and that the risk for alcohol disorders and other health effects rises proportionally with the number of drinks per week and how often a drinker exceeds daily limits.  Discussed cessation/primary prevention of drug use and availability of treatment for abuse.   Diet: Encouraged to adjust caloric intake to maintain  or achieve ideal body weight, to reduce intake of dietary saturated fat and total fat, to limit sodium intake by avoiding high sodium foods and not adding table salt, and to maintain adequate dietary potassium and calcium preferably from fresh fruits, vegetables, and low-fat dairy products.    Stressed the importance of regular exercise  Injury prevention: Discussed safety belts, safety helmets, smoke detector, smoking near bedding or upholstery.   Dental health: Discussed importance of regular tooth brushing, flossing, and dental visits.   Follow up plan: NEXT PREVENTATIVE PHYSICAL DUE IN 1 YEAR. Return in about 6 months (around 05/25/2022) for HTN/HLD, GERD, OSA.

## 2021-11-25 NOTE — Patient Instructions (Signed)

## 2021-11-25 NOTE — Assessment & Plan Note (Signed)
BMI 40.09 with A-fib, HTN/HLD.  Recommended eating smaller high protein, low fat meals more frequently and exercising 30 mins a day 5 times a week with a goal of 10-15lb weight loss in the next 3 months. Patient voiced their understanding and motivation to adhere to these recommendations.  He will think about referral to [facility] weight management.  Can not use GLP1 due to past pancreatitis issues.

## 2021-11-25 NOTE — Assessment & Plan Note (Signed)
Chronic, stable. Continue with CPAP QHS, 100% use 

## 2021-11-25 NOTE — Assessment & Plan Note (Signed)
Chronic, ongoing with repeat improved. Recommend he monitor BP at least a few mornings a week at home and document.  DASH diet at home.  Continue current medication regimen and adjust as needed.  Labs today: CBC, CMP, TSH, urine ALB.  Return in 6 months.

## 2021-11-25 NOTE — Assessment & Plan Note (Signed)
Due to A-Fib and Xarelto.  CBC annually and monitor for bleeding. 

## 2021-11-25 NOTE — Assessment & Plan Note (Signed)
Chronic, ongoing.  Poor tolerance of daily Crestor at low dose.  Will continue 3 day a week dosing at this time.  Lipid panel today.  Educated patient on statin use and side effects. 

## 2021-11-26 LAB — COMPREHENSIVE METABOLIC PANEL
ALT: 12 IU/L (ref 0–44)
AST: 24 IU/L (ref 0–40)
Albumin/Globulin Ratio: 1.6 (ref 1.2–2.2)
Albumin: 4.1 g/dL (ref 3.7–4.7)
Alkaline Phosphatase: 101 IU/L (ref 44–121)
BUN/Creatinine Ratio: 23 (ref 10–24)
BUN: 22 mg/dL (ref 8–27)
Bilirubin Total: 0.5 mg/dL (ref 0.0–1.2)
CO2: 25 mmol/L (ref 20–29)
Calcium: 9.3 mg/dL (ref 8.6–10.2)
Chloride: 105 mmol/L (ref 96–106)
Creatinine, Ser: 0.97 mg/dL (ref 0.76–1.27)
Globulin, Total: 2.5 g/dL (ref 1.5–4.5)
Glucose: 85 mg/dL (ref 70–99)
Potassium: 4.2 mmol/L (ref 3.5–5.2)
Sodium: 144 mmol/L (ref 134–144)
Total Protein: 6.6 g/dL (ref 6.0–8.5)
eGFR: 83 mL/min/{1.73_m2} (ref >59)

## 2021-11-26 LAB — LIPID PANEL W/O CHOL/HDL RATIO
Cholesterol, Total: 126 mg/dL (ref 100–199)
HDL: 47 mg/dL (ref >39)
LDL Chol Calc (NIH): 68 mg/dL (ref 0–99)
Triglycerides: 44 mg/dL (ref 0–149)
VLDL Cholesterol Cal: 11 mg/dL (ref 5–40)

## 2021-11-26 LAB — CBC WITH DIFFERENTIAL/PLATELET
Basophils Absolute: 0.1 10*3/uL (ref 0.0–0.2)
Basos: 1 %
EOS (ABSOLUTE): 0.6 10*3/uL — ABNORMAL HIGH (ref 0.0–0.4)
Eos: 10 %
Hematocrit: 40.2 % (ref 37.5–51.0)
Hemoglobin: 13.2 g/dL (ref 13.0–17.7)
Immature Grans (Abs): 0 10*3/uL (ref 0.0–0.1)
Immature Granulocytes: 0 %
Lymphocytes Absolute: 1.1 10*3/uL (ref 0.7–3.1)
Lymphs: 19 %
MCH: 30.1 pg (ref 26.6–33.0)
MCHC: 32.8 g/dL (ref 31.5–35.7)
MCV: 92 fL (ref 79–97)
Monocytes Absolute: 0.6 10*3/uL (ref 0.1–0.9)
Monocytes: 10 %
Neutrophils Absolute: 3.6 10*3/uL (ref 1.4–7.0)
Neutrophils: 60 %
Platelets: 185 10*3/uL (ref 150–450)
RBC: 4.39 x10E6/uL (ref 4.14–5.80)
RDW: 13.3 % (ref 11.6–15.4)
WBC: 5.9 10*3/uL (ref 3.4–10.8)

## 2021-11-26 LAB — PSA: Prostate Specific Ag, Serum: <0.1 ng/mL (ref 0.0–4.0)

## 2021-11-26 LAB — TSH: TSH: 1.51 u[IU]/mL (ref 0.450–4.500)

## 2021-11-26 LAB — VITAMIN D 25 HYDROXY (VIT D DEFICIENCY, FRACTURES): Vit D, 25-Hydroxy: 46.2 ng/mL (ref 30.0–100.0)

## 2021-11-26 LAB — MAGNESIUM: Magnesium: 1.8 mg/dL (ref 1.6–2.3)

## 2021-11-26 NOTE — Progress Notes (Signed)
Contacted via [place] morning Grant Diaz, your labs have returned and overall remain stable.  Even cholesterol levels remain great on your current statin dosing.  Keep up the good work.  No changes needed.  Any questions? Keep being amazing!!  Thank you for allowing me to participate in your care.  I appreciate you. Kindest regards, Aavya Shafer

## 2021-12-05 ENCOUNTER — Other Ambulatory Visit: Payer: Self-pay | Admitting: Nurse Practitioner

## 2021-12-06 NOTE — Telephone Encounter (Signed)
Requested medication (s) are due for refill today: yes  Requested medication (s) are on the active medication list: yes  Last refill:  11/10/20  Future visit scheduled: 05/25/22  Notes to clinic:  This medication can not be delegated, please assess.  Requested Prescriptions  Pending Prescriptions Disp Refills   triamcinolone (KENALOG) 0.025 % cream [Pharmacy Med Name: Triamcinolone Acetonide 0.025 % External Cream] 30 g 0    Sig: APPLY TOPICALLY TWICE DAILY AS NEEDED FOR SKIN RASH     Not Delegated - Dermatology:  Corticosteroids Failed - 12/05/2021  9:59 AM      Failed - This refill cannot be delegated      Passed - Valid encounter within last 12 months    Recent Outpatient Visits           1 week ago Paroxysmal atrial fibrillation ([place])   [place], Barbaraann Faster, NP   1 month ago Dark stools   [place], Lauren A, NP   4 months ago Paroxysmal atrial fibrillation ([place])   [place], Barbaraann Faster, NP   9 months ago Mixed hyperlipidemia   [facility] [place], Mercer T, NP   11 months ago Elevated amylase   [place], Henrine Screws T, NP       Future Appointments             In 1 month Ralene Bathe, MD [place]   In 4 months  [place], Miguel Barrera   In 5 months [place], [place] T, NP [facility]             ketoconazole (NIZORAL) 2 % cream [Pharmacy Med Name: Ketoconazole 2 % External Cream] 15 g 0    Sig: APPLY TOPICALLY DAILY AS NEEDED     Not Delegated - Over the Counter: OTC 2 Failed - 12/05/2021  9:59 AM      Failed - This refill cannot be delegated      Passed - Valid encounter within last 12 months    Recent Outpatient Visits           1 week ago Paroxysmal atrial fibrillation ([place])   [place], Barbaraann Faster, NP   1 month ago Dark stools   [place], Lauren A, NP   4 months ago  Paroxysmal atrial fibrillation ([place])   [place], Barbaraann Faster, NP   9 months ago Mixed hyperlipidemia   [place], [place] T, NP   11 months ago Elevated amylase   [place], Barbaraann Faster, NP       Future Appointments             In 1 month Ralene Bathe, MD [place]   In 4 months  [place], [place]   In 5 months [place], Barbaraann Faster, NP [facility]

## 2022-01-06 ENCOUNTER — Ambulatory Visit: Payer: Self-pay | Admitting: *Deleted

## 2022-01-06 NOTE — Telephone Encounter (Signed)
Noted  

## 2022-01-06 NOTE — Telephone Encounter (Signed)
?  Chief Complaint: requesting appt noted wheezing in the evenings  ?Symptoms: wheezing heard at the end of a breath, mostly in the evenings. Wheezing with coughing/ phlegm  legs swell at times  ?Frequency: x 1  month ?Pertinent Negatives: Patient denies chest pain , no difficulty breathing  ?Disposition: '[]'$ ED /'[]'$ Urgent Care (no appt availability in office) / '[x]'$ Appointment(In office/virtual)/ '[]'$  [facility]/ '[]'$ Home Care/ '[]'$ Refused Recommended Disposition /'[]'$ [facility]/ '[]'$  Follow-up with PCP ?Additional Notes:  ? ?Appt scheduled 01/18/22 . Please advise if sooner appt noted.  ? ? ? Reason for Disposition ? [1] MODERATE longstanding difficulty breathing (e.g., speaks in phrases, SOB even at rest, pulse 100-120) AND [2] SAME as normal ? ?Answer Assessment - Initial Assessment Questions ?1. RESPIRATORY STATUS: "Describe your breathing?" (e.g., wheezing, shortness of breath, unable to speak, severe coughing)  ?    Wheezing in the evening and coughing  ?2. ONSET: "When did this breathing problem begin?"  ?    X 1  month ?3. PATTERN "Does the difficult breathing come and go, or has it been constant since it started?"  ?    Noted in the evenings and wheezing heard at the end of a breath ?4. SEVERITY: "How bad is your breathing?" (e.g., mild, moderate, severe)  ?  - MILD: No SOB at rest, mild SOB with walking, speaks normally in sentences, can lie down, no retractions, pulse < 100.  ?  - MODERATE: SOB at rest, SOB with minimal exertion and prefers to sit, cannot lie down flat, speaks in phrases, mild retractions, audible wheezing, pulse 100-120.  ?  - SEVERE: Very SOB at rest, speaks in single words, struggling to breathe, sitting hunched forward, retractions, pulse > 120  ?    Mild  ?5. RECURRENT SYMPTOM: "Have you had difficulty breathing before?" If Yes, ask: "When was the last time?" and "What happened that time?"  ?    No  ?6. CARDIAC HISTORY: "Do you have any history of heart disease?" (e.g.,  heart attack, angina, bypass surgery, angioplasty)  ?   Afib  ?7. LUNG HISTORY: "Do you have any history of lung disease?"  (e.g., pulmonary embolus, asthma, emphysema) ?    No  ?8. CAUSE: "What do you think is causing the breathing problem?"  ?    Not sure  ?9. OTHER SYMPTOMS: "Do you have any other symptoms? (e.g., dizziness, runny nose, cough, chest pain, fever) ?    Cough in the evenings  ?10. O2 SATURATION MONITOR:  "Do you use an oxygen saturation monitor (pulse oximeter) at home?" If Yes, "What is your reading (oxygen level) today?" "What is your usual oxygen saturation reading?" (e.g., 95%) ?      na ?11. PREGNANCY: "Is there any chance you are pregnant?" "When was your last menstrual period?" ?      na ?12. TRAVEL: "Have you traveled out of the country in the last month?" (e.g., travel history, exposures) ?      na ? ?Protocols used: Breathing Difficulty-A-AH ? ?

## 2022-01-06 NOTE — Telephone Encounter (Signed)
Called pt to get scheduled with a different provider and he states that it is not that urgent for him to change his appointment. He states being seen on the 29th with Jolene is fine. ?

## 2022-01-11 ENCOUNTER — Ambulatory Visit: Payer: Medicare PPO | Admitting: Dermatology

## 2022-01-11 ENCOUNTER — Other Ambulatory Visit: Payer: Self-pay

## 2022-01-11 DIAGNOSIS — L570 Actinic keratosis: Secondary | ICD-10-CM

## 2022-01-11 DIAGNOSIS — L853 Xerosis cutis: Secondary | ICD-10-CM

## 2022-01-11 DIAGNOSIS — L814 Other melanin hyperpigmentation: Secondary | ICD-10-CM | POA: Diagnosis not present

## 2022-01-11 DIAGNOSIS — L578 Other skin changes due to chronic exposure to nonionizing radiation: Secondary | ICD-10-CM | POA: Diagnosis not present

## 2022-01-11 DIAGNOSIS — Z1283 Encounter for screening for malignant neoplasm of skin: Secondary | ICD-10-CM | POA: Diagnosis not present

## 2022-01-11 DIAGNOSIS — L2081 Atopic neurodermatitis: Secondary | ICD-10-CM | POA: Diagnosis not present

## 2022-01-11 DIAGNOSIS — D229 Melanocytic nevi, unspecified: Secondary | ICD-10-CM

## 2022-01-11 DIAGNOSIS — L304 Erythema intertrigo: Secondary | ICD-10-CM

## 2022-01-11 DIAGNOSIS — D1800 Hemangioma unspecified site: Secondary | ICD-10-CM | POA: Diagnosis not present

## 2022-01-11 DIAGNOSIS — D692 Other nonthrombocytopenic purpura: Secondary | ICD-10-CM | POA: Diagnosis not present

## 2022-01-11 DIAGNOSIS — L821 Other seborrheic keratosis: Secondary | ICD-10-CM

## 2022-01-11 MED ORDER — KETOCONAZOLE 2 % EX CREA: 1.0000 "application " | TOPICAL_CREAM | Freq: Every day | CUTANEOUS | 11 refills | Status: DC

## 2022-01-11 MED ORDER — TRIAMCINOLONE ACETONIDE 0.1 % EX CREA: 1.0000 "application " | TOPICAL_CREAM | CUTANEOUS | 3 refills | Status: DC

## 2022-01-11 NOTE — Progress Notes (Signed)
? ?New Patient Visit ? ?Subjective  ?Grant Diaz is a 71 y.o. male who presents for the following: Total body skin exam (No hx of skin ca) and Eczema (Knees, elbows, shoulders, arms, TMC 0.1% cr prn ). ?The patient presents for Total-Body Skin Exam (TBSE) for skin cancer screening and mole check.  The patient has spots, moles and lesions to be evaluated, some may be new or changing and the patient has concerns that these could be cancer.  ? ?The following portions of the chart were reviewed this encounter and updated as appropriate:  ? Tobacco  Allergies  Meds  Problems  Med Hx  Surg Hx  Fam Hx   ?  ?Review of Systems:  No other skin or systemic complaints except as noted in HPI or Assessment and Plan. ? ?Objective  ?Well appearing patient in no apparent distress; mood and affect are within normal limits. ? ?A full examination was performed including scalp, head, eyes, ears, nose, lips, neck, chest, axillae, abdomen, back, buttocks, bilateral upper extremities, bilateral lower extremities, hands, feet, fingers, toes, fingernails, and toenails. All findings within normal limits unless otherwise noted below. ? ?knees, elbows, arms, back ?Scaly pink patches arms, legs, back ? ?groin ?Clear today ? ?Scalp x 17 (17) ?Pink scaly macules ? ? ?Assessment & Plan  ? ?Lentigines ?- Scattered tan macules ?- Due to sun exposure ?- Benign-appearing, observe ?- Recommend daily broad spectrum sunscreen SPF 30+ to sun-exposed areas, reapply every 2 hours as needed. ?- Call for any changes ? ?Seborrheic Keratoses ?- Stuck-on, waxy, tan-brown papules and/or plaques  ?- Benign-appearing ?- Discussed benign etiology and prognosis. ?- Observe ?- Call for any changes ? ?Melanocytic Nevi ?- Tan-brown and/or pink-flesh-colored symmetric macules and papules ?- Benign appearing on exam today ?- Observation ?- Call clinic for new or changing moles ?- Recommend daily use of broad spectrum spf 30+ sunscreen to sun-exposed areas.   ? ?Hemangiomas ?- Red papules ?- Discussed benign nature ?- Observe ?- Call for any changes ? ?Actinic Damage ?- Chronic condition, secondary to cumulative UV/sun exposure ?- diffuse scaly erythematous macules with underlying dyspigmentation ?- Recommend daily broad spectrum sunscreen SPF 30+ to sun-exposed areas, reapply every 2 hours as needed.  ?- Staying in the shade or wearing long sleeves, sun glasses (UVA+UVB protection) and wide brim hats (4-inch brim around the entire circumference of the hat) are also recommended for sun protection.  ?- Call for new or changing lesions. ? ?Skin cancer screening performed today. ? 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 Condition is symptomatic / bothersome to patient. Not to goal. ?Atopic dermatitis (eczema) is a chronic, relapsing, pruritic condition that can significantly affect quality of life. It is often associated with allergic rhinitis and/or asthma and can require treatment with topical medications, phototherapy, or in severe cases biologic injectable medication (Dupixent; Adbry) or Oral JAK inhibitors.  ? ?TMC 0.1% cr qd up to 5d/wk aa eczema body until clear, then prn flares, avoid f/t/a ? ?Topical steroids (such as triamcinolone, fluocinolone, fluocinonide, mometasone, clobetasol, halobetasol, betamethasone, hydrocortisone) can cause thinning and lightening of the skin if they are used for too long in the same area. Your physician has selected the right strength medicine for your problem and area affected on the body. Please use your medication only as directed by your physician to prevent side effects.   ? ?triamcinolone cream (KENALOG) 0.1 % - knees, elbows, arms, back ?Apply  1 application. topically as directed. Qd up to 5 days per week to aa eczema on body until clear, avoid face, groin, axilla ? ?Intertrigo ?groin ?Intertrigo is a chronic recurrent rash that  occurs in skin fold areas that may be associated with friction; heat; moisture; yeast; fungus; and bacteria.  It is exacerbated by increased movement / activity; sweating; and higher atmospheric temperature. ? ?Ketoconazole 2% cr qhs aa groin prn flares  ? ?ketoconazole (NIZORAL) 2 % cream - groin ?Apply 1 application. topically at bedtime. Qhs to aa rash groin prn flares ? ?AK (actinic keratosis) (17) ?Scalp x 17 ?Destruction of lesion - Scalp x 17 ?Complexity: simple   ?Destruction method: cryotherapy   ?Informed consent: discussed and consent obtained   ?Timeout:  patient name, date of birth, surgical site, and procedure verified ?Lesion destroyed using liquid nitrogen: Yes   ?Region frozen until ice ball extended beyond lesion: Yes   ?Outcome: patient tolerated procedure well with no complications   ?Post-procedure details: wound care instructions given   ? ?Skin cancer screening ? ?Purpura - Chronic; persistent and recurrent.  Treatable, but not curable. ?- Violaceous macules and patches ?- Benign ?- Related to trauma, age, sun damage and/or use of blood thinners, chronic use of topical and/or oral steroids ?- Observe ?- Can use OTC arnica containing moisturizer such as Dermend Bruise Formula if desired ?- Call for worsening or other concerns  ? ?Xerosis ?- diffuse xerotic patches ?- recommend gentle, hydrating skin care ?- gentle skin care handout given  ? ?Return in about 6 months (around 07/14/2022) for AK f/u. ? ?I, Othelia Pulling, RMA, am acting as scribe for Sarina Ser, MD . ?Documentation: I have reviewed the above documentation for accuracy and completeness, and I agree with the above. ? ?Sarina Ser, MD ? ? ?

## 2022-01-11 NOTE — Patient Instructions (Signed)

## 2022-01-15 ENCOUNTER — Encounter: Payer: Self-pay | Admitting: Dermatology

## 2022-01-15 NOTE — Patient Instructions (Signed)
Asthma, Adult ?Asthma is a long-term (chronic) condition in which the airways get tight and narrow. The airways are the breathing passages that lead from the nose and mouth down into the lungs. A person with asthma will have times when symptoms get worse. These are called asthma attacks. They can cause coughing, whistling sounds when you breathe (wheezing), shortness of breath, and chest pain. They can make it hard to breathe. There is no cure for asthma, but medicines and lifestyle changes can help control it. ?There are many things that can bring on an asthma attack or make asthma symptoms worse (triggers). Common triggers include: ?Mold. ?Dust. ?Cigarette smoke. ?Cockroaches. ?Things that can cause allergy symptoms (allergens). These include animal skin flakes (dander) and pollen from trees or grass. ?Things that pollute the air. These may include household cleaners, wood smoke, smog, or chemical odors. ?Cold air, weather changes, and wind. ?Crying or laughing hard. ?Stress. ?Certain medicines or drugs. ?Certain foods such as dried fruit, potato chips, and grape juice. ?Infections, such as a cold or the flu. ?Certain medical conditions or diseases. ?Exercise or tiring activities. ?Asthma may be treated with medicines and by staying away from the things that cause asthma attacks. Types of medicines may include: ?Controller medicines. These help prevent asthma symptoms. They are usually taken every day. ?Fast-acting reliever or rescue medicines. These quickly relieve asthma symptoms. They are used as needed and provide short-term relief. ?Allergy medicines if your attacks are brought on by allergens. ?Medicines to help control the body's defense (immune) system. ?Follow these instructions at home: ?Avoiding triggers in your home ?Change your heating and air conditioning filter often. ?Limit your use of fireplaces and wood stoves. ?Get rid of pests (such as roaches and mice) and their droppings. ?Throw away plants  if you see mold on them. ?Clean your floors. Dust regularly. Use cleaning products that do not smell. ?Have someone vacuum when you are not home. Use a vacuum cleaner with a HEPA filter if possible. ?Replace carpet with wood, tile, or vinyl flooring. Carpet can trap animal skin flakes and dust. ?Use allergy-proof pillows, mattress covers, and box spring covers. ?Wash bed sheets and blankets every week in hot water. Dry them in a dryer. ?Keep your bedroom free of any triggers. ?Avoid pets and keep windows closed when things that cause allergy symptoms are in the air. ?Use blankets that are made of polyester or cotton. ?Clean bathrooms and kitchens with bleach. If possible, have someone repaint the walls in these rooms with mold-resistant paint. Keep out of the rooms that are being cleaned and painted. ?Wash your hands often with soap and water. If soap and water are not available, use hand sanitizer. ?Do not allow anyone to smoke in your home. ?General instructions ?Take over-the-counter and prescription medicines only as told by your doctor. ?Talk with your doctor if you have questions about how or when to take your medicines. ?Make note if you need to use your medicines more often than usual. ?Do not use any products that contain nicotine or tobacco, such as cigarettes and e-cigarettes. If you need help quitting, ask your doctor. ?Stay away from secondhand smoke. ?Avoid doing things outdoors when allergen counts are high and when air quality is low. ?Wear a ski mask when doing outdoor activities in the winter. The mask should cover your nose and mouth. Exercise indoors on cold days if you can. ?Warm up before you exercise. Take time to cool down after exercise. ?Use a peak flow meter as   told by your doctor. A peak flow meter is a tool that measures how well the lungs are working. ?Keep track of the peak flow meter's readings. Write them down. ?Follow your asthma action plan. This is a written plan for taking care  of your asthma and treating your attacks. ?Make sure you get all the shots (vaccines) that your doctor recommends. Ask your doctor about a flu shot and a pneumonia shot. ?Keep all follow-up visits as told by your doctor. This is important. ?Contact a doctor if: ?You have wheezing, shortness of breath, or a cough even while taking medicine to prevent attacks. ?The mucus you cough up (sputum) is thicker than usual. ?The mucus you cough up changes from clear or white to yellow, green, gray, or bloody. ?You have problems from the medicine you are taking, such as: ?A rash. ?Itching. ?Swelling. ?Trouble breathing. ?You need reliever medicines more than 2-3 times a week. ?Your peak flow reading is still at 50-79% of your personal best after following the action plan for 1 hour. ?You have a fever. ?Get help right away if: ?You seem to be worse and are not responding to medicine during an asthma attack. ?You are short of breath even at rest. ?You get short of breath when doing very little activity. ?You have trouble eating, drinking, or talking. ?You have chest pain or tightness. ?You have a fast heartbeat. ?Your lips or fingernails start to turn blue. ?You are light-headed or dizzy, or you faint. ?Your peak flow is less than 50% of your personal best. ?You feel too tired to breathe normally. ?Summary ?Asthma is a long-term (chronic) condition in which the airways get tight and narrow. An asthma attack can make it hard to breathe. ?Asthma cannot be cured, but medicines and lifestyle changes can help control it. ?Make sure you understand how to avoid triggers and how and when to use your medicines. ?This information is not intended to replace advice given to you by your health care provider. Make sure you discuss any questions you have with your health care provider. ?Document Revised: 02/01/2020 Document Reviewed: 02/11/2020 ?[employer] Patient Education ? 2022 [employer] ? ?

## 2022-01-18 ENCOUNTER — Ambulatory Visit: Payer: Medicare PPO | Admitting: Nurse Practitioner

## 2022-01-18 ENCOUNTER — Other Ambulatory Visit: Payer: Self-pay

## 2022-01-18 ENCOUNTER — Encounter: Payer: Self-pay | Admitting: Nurse Practitioner

## 2022-01-18 VITALS — BP 140/64 | HR 58 | Temp 97.4°F | Ht 68.27 in | Wt 264.8 lb

## 2022-01-18 DIAGNOSIS — J3089 Other allergic rhinitis: Secondary | ICD-10-CM

## 2022-01-18 MED ORDER — ALBUTEROL SULFATE HFA 108 (90 BASE) MCG/ACT IN AERS: 2.0000 | INHALATION_SPRAY | Freq: Four times a day (QID) | RESPIRATORY_TRACT | 0 refills | Status: DC | PRN

## 2022-01-18 NOTE — Progress Notes (Signed)
? ?BP 140/64 (BP Location: Left Arm)   Pulse (!) 58   Temp (!) 97.4 ?F (36.3 ?C) (Oral)   Ht 5' 8.27" (1.734 m)   Wt 120.1 kg   SpO2 99%   BMI 39.95 kg/m?   ? ?Subjective:  ? ? Patient ID: Grant Diaz, male    DOB: May 11, 1951, 71 y.o.   MRN: [id number] ? ?NOTE WRITTEN BY [employer] DNP STUDENT.  ASSESSMENT AND PLAN OF CARE REVIEWED WITH STUDENT, AGREE WITH ABOVE FINDINGS AND PLAN.  ? ?HPI: ?Grant Diaz is a 71 y.o. male ? ? ?Chief Complaint  ?Patient presents with  ? Wheezing  ?  For past 6 weeks  ? ?Patient states he has been experiencing wheezing at the end of exhalation for the past 6 weeks. It mostly in the early morning and late evening. ? ?WHEEZING ?Fever: no ?Cough: no dry occasional white sputum ?Shortness of breath:  ?Wheezing: yes ?Chest pain: no ?Chest tightness: no ?Chest congestion: nono ?Nasal congestion: yes in the morning ?Runny nose: no ?Post nasal drip: no ?Sneezing:  seldom ?Sore throat: no ?Swollen glands: no ?Sinus pressure: no, has pain on the left side (intermittently)  ?Headache: no ?Face pain: no ?Toothache: no ?Ear pain: no  ?Ear pressure: no  ?Eyes red/itching:yes itchy ?Eye drainage/crusting: no  ?Vomiting: no ?Rash: no  ?Fatigue: no ?Relief with OTC cold/cough medications: no  ?Treatments attempted: none   ? ?Relevant past medical, surgical, family and social history reviewed and updated as indicated. Interim medical history since our last visit reviewed. ?Allergies and medications reviewed and updated. ? ?Review of Systems  ?Constitutional:  Negative for chills, fatigue and fever.  ?HENT:  Negative for congestion, ear discharge, ear pain, sinus pressure and sinus pain.   ?Eyes:  Positive for itching. Negative for discharge, redness and visual disturbance.  ?Respiratory:  Positive for cough and wheezing. Negative for choking, chest tightness and shortness of breath.   ?Cardiovascular:  Negative for chest pain and palpitations.  ?Gastrointestinal:  Negative for nausea and vomiting.   ?Musculoskeletal: Negative.   ?Skin: Negative.   ?Allergic/Immunologic: Negative for environmental allergies.  ?Neurological:  Negative for dizziness, light-headedness and headaches.  ? ?Per HPI unless specifically indicated above ? ?   ?Objective:  ?  ?BP 140/64 (BP Location: Left Arm)   Pulse (!) 58   Temp (!) 97.4 ?F (36.3 ?C) (Oral)   Ht 5' 8.27" (1.734 m)   Wt 120.1 kg   SpO2 99%   BMI 39.95 kg/m?   ?Wt Readings from Last 3 Encounters:  ?01/18/22 120.1 kg  ?11/25/21 120.5 kg  ?10/11/21 115.9 kg  ?  ?Physical Exam ?Constitutional:   ?   General: He is not in acute distress. ?   Appearance: Normal appearance. He is well-groomed. He is not ill-appearing.  ?HENT:  ?   Head: Normocephalic and atraumatic.  ?   Salivary Glands: Right salivary gland is not diffusely enlarged or tender. Left salivary gland is not diffusely enlarged or tender.  ?   Right Ear: Tympanic membrane, ear canal and external ear normal.  ?   Left Ear: Tympanic membrane, ear canal and external ear normal.  ?   Nose: No congestion or rhinorrhea.  ?Eyes:  ?   General: Lids are normal. No allergic shiner or scleral icterus.    ?   Right eye: No discharge.     ?   Left eye: No discharge.  ?   Conjunctiva/sclera: Conjunctivae normal.  ?  Pupils: Pupils are equal, round, and reactive to light.  ?Cardiovascular:  ?   Rate and Rhythm: Normal rate and regular rhythm.  ?   Pulses: Normal pulses.  ?   Heart sounds: Normal heart sounds. No murmur heard. ?  No gallop.  ?Pulmonary:  ?   Effort: No accessory muscle usage, prolonged expiration or respiratory distress.  ?   Breath sounds: Normal breath sounds. No wheezing, rhonchi or rales.  ?Abdominal:  ?   General: Bowel sounds are normal.  ?Musculoskeletal:  ?   Cervical back: Normal range of motion.  ?Lymphadenopathy:  ?   Head:  ?   Right side of head: No submental, submandibular or tonsillar adenopathy.  ?   Left side of head: No submental, submandibular or tonsillar adenopathy.  ?   Cervical: No  cervical adenopathy.  ?Skin: ?   General: Skin is warm and dry.  ?Neurological:  ?   Mental Status: He is alert and oriented to person, place, and time.  ?Psychiatric:     ?   Attention and Perception: Attention normal.     ?   Mood and Affect: Mood and affect normal.     ?   Speech: Speech normal.     ?   Behavior: Behavior normal. Behavior is cooperative.     ?   Thought Content: Thought content normal.     ?   Judgment: Judgment normal.  ? ? ?Results for orders placed or performed in visit on 11/25/21  ?Microalbumin, Urine Waived  ?Result Value Ref Range  ? Microalb, Ur Waived 10 0 - 19 mg/L  ? Creatinine, Urine Waived 50 10 - 300 mg/dL  ? Microalb/Creat Ratio 30-300 (H) <30 mg/g  ?CBC with Differential/Platelet  ?Result Value Ref Range  ? WBC 5.9 3.4 - 10.8 x10E3/uL  ? RBC 4.39 4.14 - 5.80 x10E6/uL  ? Hemoglobin 13.2 13.0 - 17.7 g/dL  ? Hematocrit 40.2 37.5 - 51.0 %  ? MCV 92 79 - 97 fL  ? MCH 30.1 26.6 - 33.0 pg  ? MCHC 32.8 31.5 - 35.7 g/dL  ? RDW 13.3 11.6 - 15.4 %  ? Platelets 185 150 - 450 x10E3/uL  ? Neutrophils 60 Not Estab. %  ? Lymphs 19 Not Estab. %  ? Monocytes 10 Not Estab. %  ? Eos 10 Not Estab. %  ? Basos 1 Not Estab. %  ? Neutrophils Absolute 3.6 1.4 - 7.0 x10E3/uL  ? Lymphocytes Absolute 1.1 0.7 - 3.1 x10E3/uL  ? Monocytes Absolute 0.6 0.1 - 0.9 x10E3/uL  ? EOS (ABSOLUTE) 0.6 (H) 0.0 - 0.4 x10E3/uL  ? Basophils Absolute 0.1 0.0 - 0.2 x10E3/uL  ? Immature Granulocytes 0 Not Estab. %  ? Immature Grans (Abs) 0.0 0.0 - 0.1 x10E3/uL  ?Comprehensive metabolic panel  ?Result Value Ref Range  ? Glucose 85 70 - 99 mg/dL  ? BUN 22 8 - 27 mg/dL  ? Creatinine, Ser 0.97 0.76 - 1.27 mg/dL  ? eGFR 83 >59 mL/min/1.73  ? BUN/Creatinine Ratio 23 10 - 24  ? Sodium 144 134 - 144 mmol/L  ? Potassium 4.2 3.5 - 5.2 mmol/L  ? Chloride 105 96 - 106 mmol/L  ? CO2 25 20 - 29 mmol/L  ? Calcium 9.3 8.6 - 10.2 mg/dL  ? Total Protein 6.6 6.0 - 8.5 g/dL  ? Albumin 4.1 3.7 - 4.7 g/dL  ? Globulin, Total 2.5 1.5 - 4.5 g/dL  ?  Albumin/Globulin Ratio 1.6 1.2 - 2.2  ?  Bilirubin Total 0.5 0.0 - 1.2 mg/dL  ? Alkaline Phosphatase 101 44 - 121 IU/L  ? AST 24 0 - 40 IU/L  ? ALT 12 0 - 44 IU/L  ?Lipid Panel w/o Chol/HDL Ratio  ?Result Value Ref Range  ? Cholesterol, Total 126 100 - 199 mg/dL  ? Triglycerides 44 0 - 149 mg/dL  ? HDL 47 >39 mg/dL  ? VLDL Cholesterol Cal 11 5 - 40 mg/dL  ? LDL Chol Calc (NIH) 68 0 - 99 mg/dL  ?TSH  ?Result Value Ref Range  ? TSH 1.510 0.450 - 4.500 uIU/mL  ?VITAMIN D 25 Hydroxy (Vit-D Deficiency, Fractures)  ?Result Value Ref Range  ? Vit D, 25-Hydroxy 46.2 30.0 - 100.0 ng/mL  ?PSA  ?Result Value Ref Range  ? Prostate Specific Ag, Serum <0.1 0.0 - 4.0 ng/mL  ?Magnesium  ?Result Value Ref Range  ? Magnesium 1.8 1.6 - 2.3 mg/dL  ? ?   ?Assessment & Plan:  ? ?Problem List Items Addressed This Visit   ? ?  ? Other  ? Environmental and seasonal allergies - Primary  ?  Patient c/o early morning and late evening wheezing along with itchy eyes, going on for the past 6 weeks. Recommend he try otc allergy medications such as Flonase, Claritin or Xyzal to help alleviate symptoms. Will also sent prescription for Albuterol inhaler to use as needed for wheezing. Follow up is 4 weeks or sooner if needed.   ?  ?  ?  ? ?Follow up plan: ?Return in about 4 weeks (around 02/15/2022) for Allergies. ? ? ? ? ? ?

## 2022-01-18 NOTE — Assessment & Plan Note (Signed)
Patient c/o early morning and late evening wheezing along with itchy eyes, going on for the past 6 weeks. Recommend he try otc allergy medications such as Flonase, Claritin or Xyzal to help alleviate symptoms. Will also sent prescription for Albuterol inhaler to use as needed for wheezing. Follow up is 4 weeks or sooner if needed.   ?

## 2022-01-19 ENCOUNTER — Other Ambulatory Visit: Payer: Self-pay | Admitting: Nurse Practitioner

## 2022-01-20 NOTE — Telephone Encounter (Signed)
Requested medications are due for refill today.  yes ? ?Requested medications are on the active medications list.  yes ? ?Last refill. 10/25/2021 #90 0 refills ? ?Future visit scheduled.   yes ? ?Notes to clinic.  Pt has taken this medication for more than 8 weeks - manual review required. ? ? ? ?Requested Prescriptions  ?Pending Prescriptions Disp Refills  ? celecoxib (CELEBREX) 200 MG capsule [Pharmacy Med Name: Celecoxib 200 MG Oral Capsule] 90 capsule 0  ?  Sig: Take 1 capsule by mouth once daily  ?  ? Analgesics:  COX2 Inhibitors Failed - 01/19/2022  1:48 PM  ?  ?  Failed - Manual Review: Labs are only required if the patient has taken medication for more than 8 weeks.  ?  ?  Passed - HGB in normal range and within 360 days  ?  Hemoglobin  ?Date Value Ref Range Status  ?11/25/2021 13.2 13.0 - 17.7 g/dL Final  ?  ?  ?  ?  Passed - Cr in normal range and within 360 days  ?  Creatinine, Ser  ?Date Value Ref Range Status  ?11/25/2021 0.97 0.76 - 1.27 mg/dL Final  ?  ?  ?  ?  Passed - HCT in normal range and within 360 days  ?  Hematocrit  ?Date Value Ref Range Status  ?11/25/2021 40.2 37.5 - 51.0 % Final  ?  ?  ?  ?  Passed - AST in normal range and within 360 days  ?  AST  ?Date Value Ref Range Status  ?11/25/2021 24 0 - 40 IU/L Final  ?  ?  ?  ?  Passed - ALT in normal range and within 360 days  ?  ALT  ?Date Value Ref Range Status  ?11/25/2021 12 0 - 44 IU/L Final  ?  ?  ?  ?  Passed - eGFR is 30 or above and within 360 days  ?  GFR calc Af Amer  ?Date Value Ref Range Status  ?11/24/2020 101 >59 mL/min/1.73 Final  ?  Comment:  ?  **In accordance with recommendations from the NKF-ASN Task force,** ?  [facility] is in the process of updating its eGFR calculation to the ?  2021 CKD-EPI creatinine equation that estimates kidney function ?  without a race variable. ?  ? ?GFR, Estimated  ?Date Value Ref Range Status  ?06/01/2021 >60 >60 mL/min Final  ?  Comment:  ?  (NOTE) ?Calculated using the CKD-EPI Creatinine Equation  (2021) ?  ? ?eGFR  ?Date Value Ref Range Status  ?11/25/2021 83 >59 mL/min/1.73 Final  ?  ?  ?  ?  Passed - Patient is not pregnant  ?  ?  Passed - Valid encounter within last 12 months  ?  Recent Outpatient Visits   ? ?      ? 2 days ago Environmental and seasonal allergies  ? [place], [place] T, NP  ? 1 month ago Paroxysmal atrial fibrillation [facility])  ? [place], [place] T, NP  ? 3 months ago Dark stools  ? [place], Lauren A, NP  ? 5 months ago Paroxysmal atrial fibrillation ([place])  ? [place], Cleona T, NP  ? 10 months ago Mixed hyperlipidemia  ? [facility] [place], Henrine Screws T, NP  ? ?  ?  ?Future Appointments   ? ?        ? In 3 weeks Cannady, Jolene T,  NP [facility]  ? In 2 months  [facility]  ? In 4 months Cannady, Barbaraann Faster, NP [facility], [facility]  ? In 5 months Ralene Bathe, MD [place]  ? ?  ? ?  ?  ?  ?  ?

## 2022-01-23 NOTE — Telephone Encounter (Signed)
Patient made aware of results and verbalized understanding.  

## 2022-01-24 DIAGNOSIS — I351 Nonrheumatic aortic (valve) insufficiency: Secondary | ICD-10-CM | POA: Diagnosis not present

## 2022-01-24 DIAGNOSIS — I10 Essential (primary) hypertension: Secondary | ICD-10-CM | POA: Diagnosis not present

## 2022-01-24 DIAGNOSIS — K219 Gastro-esophageal reflux disease without esophagitis: Secondary | ICD-10-CM | POA: Diagnosis not present

## 2022-01-24 DIAGNOSIS — E782 Mixed hyperlipidemia: Secondary | ICD-10-CM | POA: Diagnosis not present

## 2022-01-24 DIAGNOSIS — I4891 Unspecified atrial fibrillation: Secondary | ICD-10-CM | POA: Diagnosis not present

## 2022-01-24 DIAGNOSIS — G4730 Sleep apnea, unspecified: Secondary | ICD-10-CM | POA: Diagnosis not present

## 2022-01-24 DIAGNOSIS — E669 Obesity, unspecified: Secondary | ICD-10-CM | POA: Diagnosis not present

## 2022-01-24 DIAGNOSIS — I2699 Other pulmonary embolism without acute cor pulmonale: Secondary | ICD-10-CM | POA: Diagnosis not present

## 2022-01-24 DIAGNOSIS — I340 Nonrheumatic mitral (valve) insufficiency: Secondary | ICD-10-CM | POA: Diagnosis not present

## 2022-01-25 LAB — LAB REPORT - SCANNED: EGFR: 92

## 2022-02-12 NOTE — Patient Instructions (Addendum)
Montelukast Chewable Tablets ?What is this medication? ?MONTELUKAST (mon te LOO kast) prevents and treats the symptoms of asthma and allergies. It works by decreasing inflammation in the airways, making it easier to breathe. Do not use this medication to treat a sudden asthma attack. ?This medicine may be used for other purposes; ask your health care provider or pharmacist if you have questions. ?COMMON BRAND NAME(S): Singulair ?What should I tell my care team before I take this medication? ?They need to know if you have any of these conditions: ?Liver disease ?Phenylketonuria ?An unusual or allergic reaction to montelukast, other medications, foods, dyes, or preservatives ?Pregnant or trying to get pregnant ?Breast-feeding ?How should I use this medication? ?Take this medication by mouth with water. Take it as directed on the prescription label at the same time every day. Chew or crush it completely before swallowing. Do not swallow tablets whole. You can take this medication with or without food. If it upsets your stomach, take it with food. Keep taking it unless your care team tells you to stop. ?A special MedGuide will be given to you by the pharmacist with each prescription and refill. Be sure to read this information carefully each time. ?Talk to your care team about the use of this medication in children. While it may be prescribed for children as young as 2 years for selected conditions, precautions do apply. ?Overdosage: If you think you have taken too much of this medicine contact a poison control center or emergency room at once. ?NOTE: This medicine is only for you. Do not share this medicine with others. ?What if I miss a dose? ?If you miss a dose, skip it. Take your next dose at the normal time. Do not take extra or 2 doses at the same time to make up for the missed dose. ?What may interact with this medication? ?Medications for seizures like phenytoin, phenobarbital, and  carbamazepine ?Rifabutin ?Rifampin ?This list may not describe all possible interactions. Give your health care provider a list of all the medicines, herbs, non-prescription drugs, or dietary supplements you use. Also tell them if you smoke, drink alcohol, or use illegal drugs. Some items may interact with your medicine. ?What should I watch for while using this medication? ?Visit your health care provider for regular checks on your progress. Tell your health care provider if your allergy or asthma symptoms do not improve. Take your medication even when you do not have symptoms. ?If you have asthma, talk to your health care provider about what to do in an acute asthma attack. Always have your inhaled rescue medication for asthma attacks with you. ?Patients and their families should watch for new or worsening thoughts of suicide or depression. Also watch for sudden changes in feelings such as feeling anxious, agitated, panicky, irritable, hostile, aggressive, impulsive, severely restless, overly excited and hyperactive, or not being able to sleep. Any worsening of mood or thoughts of suicide or dying should be reported to your health care provider right away. ?What side effects may I notice from receiving this medication? ?Side effects that you should report to your care team as soon as possible: ?Allergic reactions--skin rash, itching, hives, swelling of the face, lips, tongue, or throat ?Flu-like symptoms--fever, chills, muscle pain, cough, headache, fatigue ?Mood and behavior changes such as anxiety, nervousness, confusion, hallucinations, irritability, hostility, thoughts of suicide or self-harm, worsening mood, feelings of depression ?Pain, tingling, or numbness in the hands or feet ?Sinus pain or pressure around the face or forehead ?Trouble  sleeping ?Vivid dreams or nightmares ?Side effects that usually do not require medical attention (report to your care team if they continue or are  bothersome): ?Cough ?Diarrhea ?Headache ?Runny or stuffy nose ?Sore throat ?Stomach pain ?This list may not describe all possible side effects. Call your doctor for medical advice about side effects. You may report side effects to FDA at 1-800-FDA-1088. ?Where should I keep my medication? ?Keep out of the reach of children and pets. ?Store at room temperature between 15 and 30 degrees C (59 and 86 degrees F). Protect from light and moisture. Keep the container tightly closed. Get rid of any unused medication after the expiration date. ?To get rid of medications that are no longer needed or expired: ?Take the medication to a medication take-back program. Check with your pharmacy or law enforcement to find a location. ?If you cannot return the medication, check the label or package insert to see if the medication should be thrown out in the garbage or flushed down the toilet. If you are not sure, ask your care team. If it is safe to put in the trash, empty the medication out of the container. Mix the medication with cat litter, dirt, coffee grounds, or other unwanted substance. Seal the mixture in a bag or container. Put it in the trash. ?NOTE: This sheet is a summary. It may not cover all possible information. If you have questions about this medicine, talk to your doctor, pharmacist, or health care provider. ?? 2023 Elsevier/Gold Standard (2020-09-24 00:00:00) ? ?

## 2022-02-14 DIAGNOSIS — M2142 Flat foot [pes planus] (acquired), left foot: Secondary | ICD-10-CM | POA: Diagnosis not present

## 2022-02-14 DIAGNOSIS — M2141 Flat foot [pes planus] (acquired), right foot: Secondary | ICD-10-CM | POA: Diagnosis not present

## 2022-02-15 ENCOUNTER — Ambulatory Visit: Payer: Medicare PPO | Admitting: Nurse Practitioner

## 2022-02-15 ENCOUNTER — Encounter: Payer: Self-pay | Admitting: Nurse Practitioner

## 2022-02-15 DIAGNOSIS — J3089 Other allergic rhinitis: Secondary | ICD-10-CM

## 2022-02-15 NOTE — Progress Notes (Signed)
? ?BP 122/74 (BP Location: Left Arm)   Pulse 76   Temp 98 ?F (36.7 ?C) (Oral)   Resp 18   Ht 5' 8.27" (1.734 m)   Wt 263 lb 12.8 oz (119.7 kg)   SpO2 95%   BMI 39.79 kg/m?   ? ?Subjective:  ? ? Patient ID: Grant Diaz, male    DOB: 08/09/1951, 71 y.o.   MRN: [id number] ? ?HPI: ?Grant Diaz is a 71 y.o. male ? ?Chief Complaint  ?Patient presents with  ? Allergic Rhinitis   ?  Patient states over the counter Allergies medications make him feeling woozy and sluggish. Patient states he is just using the inhaler that he was prescribed and it seems to help him.   ? ?ALLERGIES ?Follow-up for allergies today.  Reports he can not take OTC allergy medications as they all make him feel woozy (Claritin and Xyzal).  Ordered Albuterol inhaler last visit, which he has been using and does help.  Notices wheezing mainly in morning and in evening with cold night air.  Once CPAP is on he is fine and notices no wheezing. ? ?Did not have childhood asthma, however majority of his family have asthma. ?Duration: months ?Runny nose: yes "clear ?Nasal congestion: yes ?Nasal itching: yes ?Sneezing: yes ?Eye swelling, itching or discharge: yes ?Post nasal drip: yes ?Cough: no ?Sinus pressure: no  ?Ear pain: none ?Ear pressure: none ?Fever: none ?Symptoms occur seasonally: yes ?Symptoms occur perenially: no ?Satisfied with current treatment: yes ?Allergist evaluation in past: no ?Allergen injection immunotherapy: no ?Recurrent sinus infections: no ?ENT evaluation in past: no ?Known environmental allergy: no ?Indoor pets: yes ?History of asthma: no ?Current allergy medications: Albuterol ?Treatments attempted: has tried all OTC oral medications  ? ?Relevant past medical, surgical, family and social history reviewed and updated as indicated. Interim medical history since our last visit reviewed. ?Allergies and medications reviewed and updated. ? ?Review of Systems  ?Constitutional:  Negative for chills, fatigue and fever.   ?Respiratory:  Negative for cough, chest tightness, shortness of breath and wheezing.   ?Cardiovascular:  Negative for chest pain and palpitations.  ?Gastrointestinal: Negative.   ?Allergic/Immunologic: Negative for environmental allergies.  ?Neurological: Negative.   ?Psychiatric/Behavioral: Negative.    ? ?Per HPI unless specifically indicated above ? ?   ?Objective:  ?  ?BP 122/74 (BP Location: Left Arm)   Pulse 76   Temp 98 ?F (36.7 ?C) (Oral)   Resp 18   Ht 5' 8.27" (1.734 m)   Wt 263 lb 12.8 oz (119.7 kg)   SpO2 95%   BMI 39.79 kg/m?   ?Wt Readings from Last 3 Encounters:  ?02/15/22 263 lb 12.8 oz (119.7 kg)  ?01/18/22 264 lb 12.8 oz (120.1 kg)  ?11/25/21 265 lb 9.6 oz (120.5 kg)  ?  ?Physical Exam ?Vitals and nursing note reviewed.  ?Constitutional:   ?   General: He is not in acute distress. ?   Appearance: Normal appearance. He is well-groomed. He is not ill-appearing or toxic-appearing.  ?HENT:  ?   Head: Normocephalic and atraumatic.  ?   Salivary Glands: Right salivary gland is not diffusely enlarged or tender. Left salivary gland is not diffusely enlarged or tender.  ?   Right Ear: Tympanic membrane, ear canal and external ear normal.  ?   Left Ear: Tympanic membrane, ear canal and external ear normal.  ?   Nose: No congestion or rhinorrhea.  ?   Mouth/Throat:  ?   Pharynx: Oropharynx  is clear.  ?Eyes:  ?   General: Lids are normal. No allergic shiner or scleral icterus.    ?   Right eye: No discharge.     ?   Left eye: No discharge.  ?   Conjunctiva/sclera: Conjunctivae normal.  ?   Pupils: Pupils are equal, round, and reactive to light.  ?Cardiovascular:  ?   Rate and Rhythm: Normal rate and regular rhythm.  ?   Pulses: Normal pulses.  ?   Heart sounds: Normal heart sounds. No murmur heard. ?  No gallop.  ?Pulmonary:  ?   Effort: No accessory muscle usage, prolonged expiration or respiratory distress.  ?   Breath sounds: Normal breath sounds. No wheezing, rhonchi or rales.  ?Abdominal:  ?    General: Bowel sounds are normal.  ?Musculoskeletal:  ?   Cervical back: Normal range of motion.  ?Lymphadenopathy:  ?   Head:  ?   Right side of head: No submental, submandibular or tonsillar adenopathy.  ?   Left side of head: No submental, submandibular or tonsillar adenopathy.  ?   Cervical: No cervical adenopathy.  ?Skin: ?   General: Skin is warm and dry.  ?Neurological:  ?   Mental Status: He is alert and oriented to person, place, and time.  ?Psychiatric:     ?   Attention and Perception: Attention normal.     ?   Mood and Affect: Mood and affect normal.     ?   Speech: Speech normal.     ?   Behavior: Behavior normal. Behavior is cooperative.     ?   Thought Content: Thought content normal.     ?   Judgment: Judgment normal.  ? ? ?Results for orders placed or performed in visit on 11/25/21  ?Microalbumin, Urine Waived  ?Result Value Ref Range  ? Microalb, Ur Waived 10 0 - 19 mg/L  ? Creatinine, Urine Waived 50 10 - 300 mg/dL  ? Microalb/Creat Ratio 30-300 (H) <30 mg/g  ?CBC with Differential/Platelet  ?Result Value Ref Range  ? WBC 5.9 3.4 - 10.8 x10E3/uL  ? RBC 4.39 4.14 - 5.80 x10E6/uL  ? Hemoglobin 13.2 13.0 - 17.7 g/dL  ? Hematocrit 40.2 37.5 - 51.0 %  ? MCV 92 79 - 97 fL  ? MCH 30.1 26.6 - 33.0 pg  ? MCHC 32.8 31.5 - 35.7 g/dL  ? RDW 13.3 11.6 - 15.4 %  ? Platelets 185 150 - 450 x10E3/uL  ? Neutrophils 60 Not Estab. %  ? Lymphs 19 Not Estab. %  ? Monocytes 10 Not Estab. %  ? Eos 10 Not Estab. %  ? Basos 1 Not Estab. %  ? Neutrophils Absolute 3.6 1.4 - 7.0 x10E3/uL  ? Lymphocytes Absolute 1.1 0.7 - 3.1 x10E3/uL  ? Monocytes Absolute 0.6 0.1 - 0.9 x10E3/uL  ? EOS (ABSOLUTE) 0.6 (H) 0.0 - 0.4 x10E3/uL  ? Basophils Absolute 0.1 0.0 - 0.2 x10E3/uL  ? Immature Granulocytes 0 Not Estab. %  ? Immature Grans (Abs) 0.0 0.0 - 0.1 x10E3/uL  ?Comprehensive metabolic panel  ?Result Value Ref Range  ? Glucose 85 70 - 99 mg/dL  ? BUN 22 8 - 27 mg/dL  ? Creatinine, Ser 0.97 0.76 - 1.27 mg/dL  ? eGFR 83 >59 mL/min/1.73   ? BUN/Creatinine Ratio 23 10 - 24  ? Sodium 144 134 - 144 mmol/L  ? Potassium 4.2 3.5 - 5.2 mmol/L  ? Chloride 105 96 - 106 mmol/L  ?  CO2 25 20 - 29 mmol/L  ? Calcium 9.3 8.6 - 10.2 mg/dL  ? Total Protein 6.6 6.0 - 8.5 g/dL  ? Albumin 4.1 3.7 - 4.7 g/dL  ? Globulin, Total 2.5 1.5 - 4.5 g/dL  ? Albumin/Globulin Ratio 1.6 1.2 - 2.2  ? Bilirubin Total 0.5 0.0 - 1.2 mg/dL  ? Alkaline Phosphatase 101 44 - 121 IU/L  ? AST 24 0 - 40 IU/L  ? ALT 12 0 - 44 IU/L  ?Lipid Panel w/o Chol/HDL Ratio  ?Result Value Ref Range  ? Cholesterol, Total 126 100 - 199 mg/dL  ? Triglycerides 44 0 - 149 mg/dL  ? HDL 47 >39 mg/dL  ? VLDL Cholesterol Cal 11 5 - 40 mg/dL  ? LDL Chol Calc (NIH) 68 0 - 99 mg/dL  ?TSH  ?Result Value Ref Range  ? TSH 1.510 0.450 - 4.500 uIU/mL  ?VITAMIN D 25 Hydroxy (Vit-D Deficiency, Fractures)  ?Result Value Ref Range  ? Vit D, 25-Hydroxy 46.2 30.0 - 100.0 ng/mL  ?PSA  ?Result Value Ref Range  ? Prostate Specific Ag, Serum <0.1 0.0 - 4.0 ng/mL  ?Magnesium  ?Result Value Ref Range  ? Magnesium 1.8 1.6 - 2.3 mg/dL  ? ?   ?Assessment & Plan:  ? ?Problem List Items Addressed This Visit   ? ?  ? Other  ? Environmental and seasonal allergies  ?  Ongoing, improving with intermittent Albuterol use.  ?some underlying asthma.  Consider spirometry in future.  At this time recommend Singulair, he will read up on this, as did not tolerate OTC medications.  Continue Albuterol as needed.  Consider allergist or ENT referral in future if ongoing symptoms. ? ?  ?  ?  ? ?Follow up plan: ?Return for as schedule in August 2023. ? ? ? ? ? ?

## 2022-02-15 NOTE — Assessment & Plan Note (Signed)
Ongoing, improving with intermittent Albuterol use.  ?some underlying asthma.  Consider spirometry in future.  At this time recommend Singulair, he will read up on this, as did not tolerate OTC medications.  Continue Albuterol as needed.  Consider allergist or ENT referral in future if ongoing symptoms. ?

## 2022-02-16 ENCOUNTER — Telehealth: Payer: Self-pay | Admitting: Nurse Practitioner

## 2022-02-16 NOTE — Telephone Encounter (Signed)
Noted  

## 2022-02-16 NOTE — Telephone Encounter (Signed)
Pt is calling to report that he will pick up his handicap decal on Tuesday. ?

## 2022-03-24 DIAGNOSIS — M2142 Flat foot [pes planus] (acquired), left foot: Secondary | ICD-10-CM | POA: Diagnosis not present

## 2022-03-24 DIAGNOSIS — M2141 Flat foot [pes planus] (acquired), right foot: Secondary | ICD-10-CM | POA: Diagnosis not present

## 2022-04-17 ENCOUNTER — Ambulatory Visit: Payer: Medicare PPO

## 2022-04-18 ENCOUNTER — Ambulatory Visit (INDEPENDENT_AMBULATORY_CARE_PROVIDER_SITE_OTHER): Payer: Medicare PPO | Admitting: *Deleted

## 2022-04-18 DIAGNOSIS — Z0000 Encounter for general adult medical examination without abnormal findings: Secondary | ICD-10-CM | POA: Diagnosis not present

## 2022-04-28 DIAGNOSIS — H2513 Age-related nuclear cataract, bilateral: Secondary | ICD-10-CM | POA: Diagnosis not present

## 2022-04-28 DIAGNOSIS — H524 Presbyopia: Secondary | ICD-10-CM | POA: Diagnosis not present

## 2022-05-15 ENCOUNTER — Other Ambulatory Visit: Payer: Self-pay | Admitting: Nurse Practitioner

## 2022-05-16 NOTE — Telephone Encounter (Signed)
Requested Prescriptions  Pending Prescriptions Disp Refills  . celecoxib (CELEBREX) 100 MG capsule [Pharmacy Med Name: Celecoxib 100 MG Oral Capsule] 30 capsule 0    Sig: TAKE 1 CAPSULE BY MOUTH ONCE DAILY AS NEEDED. RECOMMEND TO MINIMALLY USE DUE TO XARELTO DAILY USE.     Analgesics:  COX2 Inhibitors Failed - 05/15/2022  8:50 AM      Failed - Manual Review: Labs are only required if the patient has taken medication for more than 8 weeks.      Passed - HGB in normal range and within 360 days    Hemoglobin  Date Value Ref Range Status  11/25/2021 13.2 13.0 - 17.7 g/dL Final         Passed - Cr in normal range and within 360 days    Creatinine, Ser  Date Value Ref Range Status  11/25/2021 0.97 0.76 - 1.27 mg/dL Final         Passed - HCT in normal range and within 360 days    Hematocrit  Date Value Ref Range Status  11/25/2021 40.2 37.5 - 51.0 % Final         Passed - AST in normal range and within 360 days    AST  Date Value Ref Range Status  11/25/2021 24 0 - 40 IU/L Final         Passed - ALT in normal range and within 360 days    ALT  Date Value Ref Range Status  11/25/2021 12 0 - 44 IU/L Final         Passed - eGFR is 30 or above and within 360 days    GFR calc Af Amer  Date Value Ref Range Status  11/24/2020 101 >59 mL/min/1.73 Final    Comment:    **In accordance with recommendations from the NKF-ASN Task force,**   [facility] is in the process of updating its eGFR calculation to the   2021 CKD-EPI creatinine equation that estimates kidney function   without a race variable.    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

## 2022-05-21 NOTE — Patient Instructions (Signed)

## 2022-05-25 ENCOUNTER — Encounter: Payer: Self-pay | Admitting: Nurse Practitioner

## 2022-05-25 ENCOUNTER — Ambulatory Visit: Payer: Medicare PPO | Admitting: Nurse Practitioner

## 2022-05-25 VITALS — BP 140/84 | HR 66 | Temp 97.6°F | Ht 68.0 in | Wt 264.7 lb

## 2022-05-25 DIAGNOSIS — I10 Essential (primary) hypertension: Secondary | ICD-10-CM

## 2022-05-25 DIAGNOSIS — K2270 Barrett's esophagus without dysplasia: Secondary | ICD-10-CM | POA: Diagnosis not present

## 2022-05-25 DIAGNOSIS — G4733 Obstructive sleep apnea (adult) (pediatric): Secondary | ICD-10-CM | POA: Diagnosis not present

## 2022-05-25 DIAGNOSIS — D692 Other nonthrombocytopenic purpura: Secondary | ICD-10-CM | POA: Diagnosis not present

## 2022-05-25 DIAGNOSIS — E782 Mixed hyperlipidemia: Secondary | ICD-10-CM | POA: Diagnosis not present

## 2022-05-25 DIAGNOSIS — I480 Paroxysmal atrial fibrillation: Secondary | ICD-10-CM | POA: Diagnosis not present

## 2022-05-25 MED ORDER — AMLODIPINE BESYLATE 10 MG PO TABS: 10.0000 mg | ORAL_TABLET | Freq: Every day | ORAL | 4 refills | Status: DC

## 2022-05-25 NOTE — Assessment & Plan Note (Signed)
Noted on exam, discussed with patient.  Ensure gentle skin care and monitor for wounds, alert provider if they present.

## 2022-05-25 NOTE — Assessment & Plan Note (Signed)
Chronic with rate controlled today.  Diagnosed by cardiology. Dr. Humphrey Rolls, prior to surgery 05/31/21.  No current BB or rate control.  Continue Xarelto as ordered.  Attempt to obtain recent notes.

## 2022-05-25 NOTE — Assessment & Plan Note (Signed)
Chronic, ongoing.  Poor tolerance of daily Crestor at low dose.  Will continue 3 day a week dosing at this time.  Lipid panel today.  Educated patient on statin use and side effects.

## 2022-05-25 NOTE — Assessment & Plan Note (Signed)
Chronic, ongoing with repeat today remaining above goal. Recommend he monitor BP at least a few mornings a week at home and document.  DASH diet at home.  Continue current medication regimen, with exception of increasing Amlodipine to 10 MG daily (stop 5 MG dosing) due to elevations.  Labs today: CMP.  Return in 6 months.

## 2022-05-25 NOTE — Progress Notes (Signed)
BP (!) 140/84 (BP Location: Left Arm, Patient Position: Sitting, Cuff Size: Large)   Pulse 66   Temp 97.6 F (36.4 C) (Oral)   Ht 5' 8"  (1.727 m)   Wt 264 lb 11.2 oz (120.1 kg)   SpO2 96%   BMI 40.25 kg/m    Subjective:    Patient ID: Grant Diaz, male    DOB: Oct 26, 1950, 71 y.o.   MRN: [id number]  HPI: Grant Diaz is a 71 y.o. male  Chief Complaint  Patient presents with   Hyperlipidemia   Hypertension    Patient says he has cut back on one of his blood pressure medications per Cardiologist Dr.Khan request. Patient states he has noticed an elevation in his blood pressure readings within the past 2-3 months.    Sleep Apnea   Gastroesophageal Reflux   ATRIAL FIBRILLATION Was diagnosed prior to surgery 2022 and taking Xarelto as ordered by Dr. Humphrey Rolls. Atrial fibrillation status: controlled Satisfied with current treatment: yes  Medication side effects:  no Medication compliance: good compliance Etiology of atrial fibrillation:  Palpitations:  no Chest pain:  no Dyspnea on exertion:  no Orthopnea:  no Syncope:  no Edema:  no Ventricular rate control:  none Anti-coagulation: long acting   HYPERLIPIDEMIA/HTN Continues on Lisinopril/HCTZ and Amlodipine.  Taking Crestor 10 MG TID. Has noticed BP trended up a little with cardiology reduction in Amlodipine to 5 MG, was on 10 MG -- he wonders if related to weight increase too.  Is attending weight watchers.  Continues to use CPAP nightly.   Satisfied with current treatment? yes Duration of hypertension: chronic BP monitoring frequency: weekly BP range: 140/80 range BP medication side effects: no Duration of hyperlipidemia: chronic Cholesterol medication side effects: no Cholesterol supplements: none Medication compliance: good compliance Aspirin: no Recent stressors: no Recurrent headaches: no Visual changes: no Palpitations: no Dyspnea: no Chest pain: no Lower extremity edema: no Dizzy/lightheaded: no    GERD Has had EGD in past with hiatal hernia, last 01/27/21. Had repair of this on 05/31/21 with lap band removal. Taking Prilosec every day which is helping. Continues on Omeprazole.  Last saw GI 08/09/21. Had history of pancreatitis.  In 2010 CT showed possible pancreatic mass, but MRI showed no mass.  GERD control status: controlled Satisfied with current treatment? yes Heartburn frequency: occasional, very rare -- takes Gas-Ex and this improves it Medication side effects: no  Medication compliance: stable Dysphagia: no Odynophagia:  no Hematemesis: no Blood in stool: no EGD: yes   Relevant past medical, surgical, family and social history reviewed and updated as indicated. Interim medical history since our last visit reviewed. Allergies and medications reviewed and updated.  Review of Systems  Constitutional:  Negative for activity change, diaphoresis, fatigue and fever.  Respiratory:  Negative for cough, chest tightness, shortness of breath and wheezing.   Cardiovascular:  Negative for chest pain, palpitations and leg swelling.  Gastrointestinal: Negative.   Neurological: Negative.   Psychiatric/Behavioral: Negative.     Per HPI unless specifically indicated above     Objective:    BP (!) 140/84 (BP Location: Left Arm, Patient Position: Sitting, Cuff Size: Large)   Pulse 66   Temp 97.6 F (36.4 C) (Oral)   Ht 5' 8"  (1.727 m)   Wt 264 lb 11.2 oz (120.1 kg)   SpO2 96%   BMI 40.25 kg/m   Wt Readings from Last 3 Encounters:  05/25/22 264 lb 11.2 oz (120.1 kg)  02/15/22 263 lb 12.8  oz (119.7 kg)  01/18/22 264 lb 12.8 oz (120.1 kg)    Physical Exam Vitals and nursing note reviewed.  Constitutional:      General: He is awake. He is not in acute distress.    Appearance: He is well-developed. He is obese. He is not ill-appearing.  HENT:     Head: Normocephalic and atraumatic.     Right Ear: Hearing normal. No drainage.     Left Ear: Hearing normal. No drainage.  Eyes:      General: Lids are normal.        Right eye: No discharge.        Left eye: No discharge.     Conjunctiva/sclera: Conjunctivae normal.     Pupils: Pupils are equal, round, and reactive to light.  Neck:     Vascular: No carotid bruit.  Cardiovascular:     Rate and Rhythm: Normal rate and regular rhythm.     Heart sounds: Normal heart sounds, S1 normal and S2 normal. No murmur heard.    No gallop.  Pulmonary:     Effort: Pulmonary effort is normal. No accessory muscle usage or respiratory distress.     Breath sounds: Normal breath sounds.  Abdominal:     General: Bowel sounds are normal. There is no distension.     Palpations: Abdomen is soft.     Tenderness: There is no abdominal tenderness. There is no right CVA tenderness or left CVA tenderness.     Hernia: No hernia is present.  Musculoskeletal:        General: Normal range of motion.     Cervical back: Normal range of motion and neck supple.     Right lower leg: No edema.     Left lower leg: No edema.  Skin:    General: Skin is warm and dry.     Comments: Scattered pale bruising to bilateral upper extremities, skin intact.  Neurological:     Mental Status: He is alert and oriented to person, place, and time.  Psychiatric:        Attention and Perception: Attention normal.        Mood and Affect: Mood normal.        Speech: Speech normal.        Behavior: Behavior normal. Behavior is cooperative.        Thought Content: Thought content normal.    Results for orders placed or performed in visit on 11/25/21  Microalbumin, Urine Waived  Result Value Ref Range   Microalb, Ur Waived 10 0 - 19 mg/L   Creatinine, Urine Waived 50 10 - 300 mg/dL   Microalb/Creat Ratio 30-300 (H) <30 mg/g  CBC with Differential/Platelet  Result Value Ref Range   WBC 5.9 3.4 - 10.8 x10E3/uL   RBC 4.39 4.14 - 5.80 x10E6/uL   Hemoglobin 13.2 13.0 - 17.7 g/dL   Hematocrit 40.2 37.5 - 51.0 %   MCV 92 79 - 97 fL   MCH 30.1 26.6 - 33.0 pg    MCHC 32.8 31.5 - 35.7 g/dL   RDW 13.3 11.6 - 15.4 %   Platelets 185 150 - 450 x10E3/uL   Neutrophils 60 Not Estab. %   Lymphs 19 Not Estab. %   Monocytes 10 Not Estab. %   Eos 10 Not Estab. %   Basos 1 Not Estab. %   Neutrophils Absolute 3.6 1.4 - 7.0 x10E3/uL   Lymphocytes Absolute 1.1 0.7 - 3.1 x10E3/uL   Monocytes Absolute 0.6 0.1 -  0.9 x10E3/uL   EOS (ABSOLUTE) 0.6 (H) 0.0 - 0.4 x10E3/uL   Basophils Absolute 0.1 0.0 - 0.2 x10E3/uL   Immature Granulocytes 0 Not Estab. %   Immature Grans (Abs) 0.0 0.0 - 0.1 x10E3/uL  Comprehensive metabolic panel  Result Value Ref Range   Glucose 85 70 - 99 mg/dL   BUN 22 8 - 27 mg/dL   Creatinine, Ser 0.97 0.76 - 1.27 mg/dL   eGFR 83 >59 mL/min/1.73   BUN/Creatinine Ratio 23 10 - 24   Sodium 144 134 - 144 mmol/L   Potassium 4.2 3.5 - 5.2 mmol/L   Chloride 105 96 - 106 mmol/L   CO2 25 20 - 29 mmol/L   Calcium 9.3 8.6 - 10.2 mg/dL   Total Protein 6.6 6.0 - 8.5 g/dL   Albumin 4.1 3.7 - 4.7 g/dL   Globulin, Total 2.5 1.5 - 4.5 g/dL   Albumin/Globulin Ratio 1.6 1.2 - 2.2   Bilirubin Total 0.5 0.0 - 1.2 mg/dL   Alkaline Phosphatase 101 44 - 121 IU/L   AST 24 0 - 40 IU/L   ALT 12 0 - 44 IU/L  Lipid Panel w/o Chol/HDL Ratio  Result Value Ref Range   Cholesterol, Total 126 100 - 199 mg/dL   Triglycerides 44 0 - 149 mg/dL   HDL 47 >39 mg/dL   VLDL Cholesterol Cal 11 5 - 40 mg/dL   LDL Chol Calc (NIH) 68 0 - 99 mg/dL  TSH  Result Value Ref Range   TSH 1.510 0.450 - 4.500 uIU/mL  VITAMIN D 25 Hydroxy (Vit-D Deficiency, Fractures)  Result Value Ref Range   Vit D, 25-Hydroxy 46.2 30.0 - 100.0 ng/mL  PSA  Result Value Ref Range   Prostate Specific Ag, Serum <0.1 0.0 - 4.0 ng/mL  Magnesium  Result Value Ref Range   Magnesium 1.8 1.6 - 2.3 mg/dL      Assessment & Plan:   Problem List Items Addressed This Visit       Cardiovascular and Mediastinum   Atrial fibrillation (HCC) - Primary    Chronic with rate controlled today.  Diagnosed  by cardiology. Dr. Humphrey Rolls, prior to surgery 05/31/21.  No current BB or rate control.  Continue Xarelto as ordered.  Attempt to obtain recent notes.      Relevant Medications   amLODipine (NORVASC) 10 MG tablet   Essential hypertension, benign    Chronic, ongoing with repeat today remaining above goal. Recommend he monitor BP at least a few mornings a week at home and document.  DASH diet at home.  Continue current medication regimen, with exception of increasing Amlodipine to 10 MG daily (stop 5 MG dosing) due to elevations.  Labs today: CMP.  Return in 6 months.       Relevant Medications   amLODipine (NORVASC) 10 MG tablet   Senile purpura ([place])    Noted on exam, discussed with patient.  Ensure gentle skin care and monitor for wounds, alert provider if they present.      Relevant Medications   amLODipine (NORVASC) 10 MG tablet     Respiratory   Sleep apnea    Chronic, stable. Continue with CPAP QHS, 100% use        Digestive   Barrett esophagus    Chronic, stable, noted on EGD in 2022.   Continue current medication regimen and adjust as needed, lifelong PPI.  Mag level annually.      Relevant Orders   Comprehensive metabolic panel  Other   Mixed hyperlipidemia    Chronic, ongoing.  Poor tolerance of daily Crestor at low dose.  Will continue 3 day a week dosing at this time.  Lipid panel today.  Educated patient on statin use and side effects.      Relevant Medications   amLODipine (NORVASC) 10 MG tablet   Other Relevant Orders   Comprehensive metabolic panel   Lipid Panel w/o Chol/HDL Ratio   Morbid obesity (HCC)    BMI 40.25 with A-fib, HTN/HLD.  Recommended eating smaller high protein, low fat meals more frequently and exercising 30 mins a day 5 times a week with a goal of 10-15lb weight loss in the next 3 months. Patient voiced their understanding and motivation to adhere to these recommendations.  He will think about referral to [facility] weight management.  Can not use  GLP1 due to past pancreatitis issues.         Follow up plan: Return in about 6 months (around 11/25/2022) for Annual physical after 11/25/22.

## 2022-05-25 NOTE — Assessment & Plan Note (Signed)
Chronic, stable, noted on EGD in 2022.   Continue current medication regimen and adjust as needed, lifelong PPI.  Mag level annually.

## 2022-05-25 NOTE — Assessment & Plan Note (Signed)
Chronic, stable. Continue with CPAP QHS, 100% use 

## 2022-05-25 NOTE — Assessment & Plan Note (Signed)
BMI 40.25 with A-fib, HTN/HLD.  Recommended eating smaller high protein, low fat meals more frequently and exercising 30 mins a day 5 times a week with a goal of 10-15lb weight loss in the next 3 months. Patient voiced their understanding and motivation to adhere to these recommendations.  He will think about referral to [facility] weight management.  Can not use GLP1 due to past pancreatitis issues.

## 2022-05-26 LAB — LIPID PANEL W/O CHOL/HDL RATIO
Cholesterol, Total: 110 mg/dL (ref 100–199)
HDL: 42 mg/dL (ref >39)
LDL Chol Calc (NIH): 53 mg/dL (ref 0–99)
Triglycerides: 71 mg/dL (ref 0–149)
VLDL Cholesterol Cal: 15 mg/dL (ref 5–40)

## 2022-05-26 LAB — COMPREHENSIVE METABOLIC PANEL
ALT: 15 IU/L (ref 0–44)
AST: 24 IU/L (ref 0–40)
Albumin/Globulin Ratio: 1.5 (ref 1.2–2.2)
Albumin: 4 g/dL (ref 3.8–4.8)
Alkaline Phosphatase: 91 IU/L (ref 44–121)
BUN/Creatinine Ratio: 22 (ref 10–24)
BUN: 22 mg/dL (ref 8–27)
Bilirubin Total: 0.5 mg/dL (ref 0.0–1.2)
CO2: 23 mmol/L (ref 20–29)
Calcium: 9.2 mg/dL (ref 8.6–10.2)
Chloride: 102 mmol/L (ref 96–106)
Creatinine, Ser: 0.98 mg/dL (ref 0.76–1.27)
Globulin, Total: 2.6 g/dL (ref 1.5–4.5)
Glucose: 97 mg/dL (ref 70–99)
Potassium: 3.7 mmol/L (ref 3.5–5.2)
Sodium: 139 mmol/L (ref 134–144)
Total Protein: 6.6 g/dL (ref 6.0–8.5)
eGFR: 82 mL/min/{1.73_m2} (ref >59)

## 2022-05-26 NOTE — Progress Notes (Signed)
Contacted via [name], your labs have returned and overall these continue to look fantastic!!  Kidney function, creatinine and eGFR, remains normal, as is liver function, AST and ALT.  Cholesterol levels remain at goal for stroke and heart protection with LDL at 53 -- continue 3 day a week statin.  Any questions? Keep being marvelous!!  Thank you for allowing me to participate in your care.  I appreciate you. Kindest regards, Amiri Tritch

## 2022-06-06 DIAGNOSIS — H25813 Combined forms of age-related cataract, bilateral: Secondary | ICD-10-CM | POA: Diagnosis not present

## 2022-06-06 DIAGNOSIS — Z01818 Encounter for other preprocedural examination: Secondary | ICD-10-CM | POA: Diagnosis not present

## 2022-06-07 ENCOUNTER — Other Ambulatory Visit: Payer: Self-pay | Admitting: Nurse Practitioner

## 2022-06-07 NOTE — Telephone Encounter (Signed)
Requested Prescriptions  Pending Prescriptions Disp Refills  . celecoxib (CELEBREX) 100 MG capsule [Pharmacy Med Name: Celecoxib 100 MG Oral Capsule] 30 capsule 0    Sig: TAKE 1 CAPSULE BY MOUTH ONCE DAILY AS NEEDED. RECOMMEND TO MINIMALLY USE DUE TO XARELTO TO USE DAILY.     Analgesics:  COX2 Inhibitors Failed - 06/07/2022 11:27 AM      Failed - Manual Review: Labs are only required if the patient has taken medication for more than 8 weeks.      Passed - HGB in normal range and within 360 days    Hemoglobin  Date Value Ref Range Status  11/25/2021 13.2 13.0 - 17.7 g/dL Final         Passed - Cr in normal range and within 360 days    Creatinine, Ser  Date Value Ref Range Status  05/25/2022 0.98 0.76 - 1.27 mg/dL Final         Passed - HCT in normal range and within 360 days    Hematocrit  Date Value Ref Range Status  11/25/2021 40.2 37.5 - 51.0 % Final         Passed - AST in normal range and within 360 days    AST  Date Value Ref Range Status  05/25/2022 24 0 - 40 IU/L Final         Passed - ALT in normal range and within 360 days    ALT  Date Value Ref Range Status  05/25/2022 15 0 - 44 IU/L Final         Passed - eGFR is 30 or above and within 360 days    GFR calc Af Amer  Date Value Ref Range Status  11/24/2020 101 >59 mL/min/1.73 Final    Comment:    **In accordance with recommendations from the NKF-ASN Task force,**   [facility] is in the process of updating its eGFR calculation to the   2021 CKD-EPI creatinine equation that estimates kidney function   without a race variable.    GFR, Estimated  Date Value Ref Range Status  06/01/2021 >60 >60 mL/min Final    Comment:    (NOTE) Calculated using the CKD-EPI Creatinine Equation (2021)    eGFR  Date Value Ref Range Status  05/25/2022 82 >59 mL/min/1.73 Final         Passed - Patient is not pregnant      Passed - Valid encounter within last 12 months    Recent Outpatient Visits          1 week ago  Paroxysmal atrial fibrillation ([place])   [place], Gazelle T, NP   3 months ago Environmental and seasonal allergies   [facility] [place], Artois T, NP   4 months ago Programme researcher, broadcasting/film/video and seasonal allergies   [facility] [place], [place] T, NP   6 months ago Paroxysmal atrial fibrillation ([place])   [place], Henrine [place] T, NP   7 months ago Dark stools   [place], Lauren A, NP      Future Appointments            In 1 month Ralene Bathe, MD [place]   In 5 months [place], Barbaraann Faster, NP [employer] [facility]

## 2022-06-16 DIAGNOSIS — H269 Unspecified cataract: Secondary | ICD-10-CM | POA: Diagnosis not present

## 2022-06-16 DIAGNOSIS — H25812 Combined forms of age-related cataract, left eye: Secondary | ICD-10-CM | POA: Diagnosis not present

## 2022-07-09 ENCOUNTER — Other Ambulatory Visit: Payer: Self-pay | Admitting: Nurse Practitioner

## 2022-07-10 NOTE — Telephone Encounter (Signed)
Requested medication (s) are due for refill today - yes  Requested medication (s) are on the active medication list -yes  Future visit scheduled -yes  Last refill: 06/07/22  Notes to clinic: Pharmacy request review- patient is taking Xarelto and wants to make sure patient needs this medication. Call to patient- Patient returned call and advised provider is aware- actually lower the dose- he does take this daily and feels he needs it daily. Sent to office for review since pharmacy has concern.  Requested Prescriptions  Pending Prescriptions Disp Refills   celecoxib (CELEBREX) 100 MG capsule [Pharmacy Med Name: Celecoxib 100 MG Oral Capsule] 30 capsule 0    Sig: TAKE 1 CAPSULE BY MOUTH ONCE DAILY AS NEEDED. USE MINIMALLY SINCE YOU ARE TAKING XARELTO     Analgesics:  COX2 Inhibitors Failed - 07/09/2022 12:55 PM      Failed - Manual Review: Labs are only required if the patient has taken medication for more than 8 weeks.      Passed - HGB in normal range and within 360 days    Hemoglobin  Date Value Ref Range Status  11/25/2021 13.2 13.0 - 17.7 g/dL Final         Passed - Cr in normal range and within 360 days    Creatinine, Ser  Date Value Ref Range Status  05/25/2022 0.98 0.76 - 1.27 mg/dL Final         Passed - HCT in normal range and within 360 days    Hematocrit  Date Value Ref Range Status  11/25/2021 40.2 37.5 - 51.0 % Final         Passed - AST in normal range and within 360 days    AST  Date Value Ref Range Status  05/25/2022 24 0 - 40 IU/L Final         Passed - ALT in normal range and within 360 days    ALT  Date Value Ref Range Status  05/25/2022 15 0 - 44 IU/L Final         Passed - eGFR is 30 or above and within 360 days    GFR calc Af Amer  Date Value Ref Range Status  11/24/2020 101 >59 mL/min/1.73 Final    Comment:    **In accordance with recommendations from the NKF-ASN Task force,**   [facility] is in the process of updating its eGFR calculation to  the   2021 CKD-EPI creatinine equation that estimates kidney function   without a race variable.    GFR, Estimated  Date Value Ref Range Status  06/01/2021 >60 >60 mL/min Final    Comment:    (NOTE) Calculated using the CKD-EPI Creatinine Equation (2021)    eGFR  Date Value Ref Range Status  05/25/2022 82 >59 mL/min/1.73 Final         Passed - Patient is not pregnant      Passed - Valid encounter within last 12 months    Recent Outpatient Visits           1 month ago Paroxysmal atrial fibrillation ([place])   [place], [place] T, NP   4 months ago Programme researcher, broadcasting/film/video and seasonal allergies   [place], [place] T, NP   5 months ago Programme researcher, broadcasting/film/video and seasonal allergies   [place], [place] T, NP   7 months ago Paroxysmal atrial fibrillation ([place])   [place], [place] T, NP   9 months ago Dark stools  [place], NP       Future Appointments             In 3 weeks Ralene Bathe, MD [place]   In 4 months [place], Barbaraann Faster, NP [facility]               Requested Prescriptions  Pending Prescriptions Disp Refills   celecoxib (CELEBREX) 100 MG capsule [Pharmacy Med Name: Celecoxib 100 MG Oral Capsule] 30 capsule 0    Sig: TAKE 1 CAPSULE BY MOUTH ONCE DAILY AS NEEDED. USE MINIMALLY SINCE YOU ARE TAKING XARELTO     Analgesics:  COX2 Inhibitors Failed - 07/09/2022 12:55 PM      Failed - Manual Review: Labs are only required if the patient has taken medication for more than 8 weeks.      Passed - HGB in normal range and within 360 days    Hemoglobin  Date Value Ref Range Status  11/25/2021 13.2 13.0 - 17.7 g/dL Final         Passed - Cr in normal range and within 360 days    Creatinine, Ser  Date Value Ref Range Status  05/25/2022 0.98 0.76 - 1.27 mg/dL Final         Passed - HCT in normal range and within 360  days    Hematocrit  Date Value Ref Range Status  11/25/2021 40.2 37.5 - 51.0 % Final         Passed - AST in normal range and within 360 days    AST  Date Value Ref Range Status  05/25/2022 24 0 - 40 IU/L Final         Passed - ALT in normal range and within 360 days    ALT  Date Value Ref Range Status  05/25/2022 15 0 - 44 IU/L Final         Passed - eGFR is 30 or above and within 360 days    GFR calc Af Amer  Date Value Ref Range Status  11/24/2020 101 >59 mL/min/1.73 Final    Comment:    **In accordance with recommendations from the NKF-ASN Task force,**   [facility] is in the process of updating its eGFR calculation to the   2021 CKD-EPI creatinine equation that estimates kidney function   without a race variable.    GFR, Estimated  Date Value Ref Range Status  06/01/2021 >60 >60 mL/min Final    Comment:    (NOTE) Calculated using the CKD-EPI Creatinine Equation (2021)    eGFR  Date Value Ref Range Status  05/25/2022 82 >59 mL/min/1.73 Final         Passed - Patient is not pregnant      Passed - Valid encounter within last 12 months    Recent Outpatient Visits           1 month ago Paroxysmal atrial fibrillation ([place])   [place], [place] T, NP   4 months ago Environmental and seasonal allergies   [place], [place] T, NP   5 months ago Programme researcher, broadcasting/film/video and seasonal allergies   [place], [place] T, NP   7 months ago Paroxysmal atrial fibrillation ([place])   [place], Barbaraann Faster, NP   9 months ago Dark stools   [place] Charyl Dancer, NP       Future Appointments  In 3 weeks Ralene Bathe, MD [place]   In 4 months [place], Barbaraann Faster, NP [facility]

## 2022-07-10 NOTE — Telephone Encounter (Signed)
Voicemail left asking pt to return call to discuss if he is taking Celebrex daily. Jolene had asked him to take sparingly due to being on Xarelto. Pharm is requesting refill, questioning if pt needs it.

## 2022-07-13 DIAGNOSIS — H269 Unspecified cataract: Secondary | ICD-10-CM | POA: Diagnosis not present

## 2022-07-13 DIAGNOSIS — H25811 Combined forms of age-related cataract, right eye: Secondary | ICD-10-CM | POA: Diagnosis not present

## 2022-07-17 ENCOUNTER — Ambulatory Visit: Payer: Medicare PPO | Admitting: Dermatology

## 2022-07-27 DIAGNOSIS — E669 Obesity, unspecified: Secondary | ICD-10-CM | POA: Diagnosis not present

## 2022-07-27 DIAGNOSIS — I351 Nonrheumatic aortic (valve) insufficiency: Secondary | ICD-10-CM | POA: Diagnosis not present

## 2022-07-27 DIAGNOSIS — K219 Gastro-esophageal reflux disease without esophagitis: Secondary | ICD-10-CM | POA: Diagnosis not present

## 2022-07-27 DIAGNOSIS — I10 Essential (primary) hypertension: Secondary | ICD-10-CM | POA: Diagnosis not present

## 2022-07-27 DIAGNOSIS — E782 Mixed hyperlipidemia: Secondary | ICD-10-CM | POA: Diagnosis not present

## 2022-07-27 DIAGNOSIS — I4891 Unspecified atrial fibrillation: Secondary | ICD-10-CM | POA: Diagnosis not present

## 2022-07-27 DIAGNOSIS — I340 Nonrheumatic mitral (valve) insufficiency: Secondary | ICD-10-CM | POA: Diagnosis not present

## 2022-07-27 DIAGNOSIS — G4730 Sleep apnea, unspecified: Secondary | ICD-10-CM | POA: Diagnosis not present

## 2022-07-27 DIAGNOSIS — I2699 Other pulmonary embolism without acute cor pulmonale: Secondary | ICD-10-CM | POA: Diagnosis not present

## 2022-08-02 ENCOUNTER — Ambulatory Visit: Payer: Medicare PPO | Admitting: Dermatology

## 2022-08-02 DIAGNOSIS — L578 Other skin changes due to chronic exposure to nonionizing radiation: Secondary | ICD-10-CM | POA: Diagnosis not present

## 2022-08-02 DIAGNOSIS — L570 Actinic keratosis: Secondary | ICD-10-CM | POA: Diagnosis not present

## 2022-08-02 DIAGNOSIS — L2081 Atopic neurodermatitis: Secondary | ICD-10-CM

## 2022-08-02 DIAGNOSIS — Z79899 Other long term (current) drug therapy: Secondary | ICD-10-CM | POA: Diagnosis not present

## 2022-08-02 DIAGNOSIS — R21 Rash and other nonspecific skin eruption: Secondary | ICD-10-CM | POA: Diagnosis not present

## 2022-08-02 DIAGNOSIS — L2089 Other atopic dermatitis: Secondary | ICD-10-CM | POA: Diagnosis not present

## 2022-08-02 DIAGNOSIS — L308 Other specified dermatitis: Secondary | ICD-10-CM | POA: Diagnosis not present

## 2022-08-02 DIAGNOSIS — D692 Other nonthrombocytopenic purpura: Secondary | ICD-10-CM

## 2022-08-02 DIAGNOSIS — L304 Erythema intertrigo: Secondary | ICD-10-CM | POA: Diagnosis not present

## 2022-08-02 MED ORDER — TRIAMCINOLONE ACETONIDE 0.1 % EX CREA: 1.0000 | TOPICAL_CREAM | CUTANEOUS | 6 refills | Status: AC

## 2022-08-02 MED ORDER — KETOCONAZOLE 2 % EX CREA: TOPICAL_CREAM | CUTANEOUS | 6 refills | Status: DC

## 2022-08-02 NOTE — Patient Instructions (Addendum)
Start over the counter Cerave cream apply to body daily after bath or shower   Cryotherapy Aftercare  Wash gently with soap and water everyday.   Apply Vaseline and Band-Aid daily until healed.    Wound Care Instructions  Cleanse wound gently with soap and water once a day then pat dry with clean gauze. Apply a thin coat of Petrolatum (petroleum jelly, "Vaseline") over the wound (unless you have an allergy to this). We recommend that you use a new, sterile tube of Vaseline. Do not pick or remove scabs. Do not remove the yellow or white "healing tissue" from the base of the wound.  Cover the wound with fresh, clean, nonstick gauze and secure with paper tape. You may use Band-Aids in place of gauze and tape if the wound is small enough, but would recommend trimming much of the tape off as there is often too much. Sometimes Band-Aids can irritate the skin.  You should call the office for your biopsy report after 1 week if you have not already been contacted.  If you experience any problems, such as abnormal amounts of bleeding, swelling, significant bruising, significant pain, or evidence of infection, please call the office immediately.  FOR ADULT SURGERY PATIENTS: If you need something for pain relief you may take 1 extra strength Tylenol (acetaminophen) AND 2 Ibuprofen ('200mg'$  each) together every 4 hours as needed for pain. (do not take these if you are allergic to them or if you have a reason you should not take them.) Typically, you may only need pain medication for 1 to 3 days.        Due to recent changes in healthcare laws, you may see results of your pathology and/or laboratory studies on MyChart before the doctors have had a chance to review them. We understand that in some cases there may be results that are confusing or concerning to you. Please understand that not all results are received at the same time and often the doctors may need to interpret multiple results in order to  provide you with the best plan of care or course of treatment. Therefore, we ask that you please give [place] 2 business days to thoroughly review all your results before contacting the office for clarification. Should we see a critical lab result, you will be contacted sooner.   If You Need Anything After Your Visit  If you have any questions or concerns for your doctor, please call our main line at [phone] and press option 4 to reach your doctor's medical assistant. If no one answers, please leave a voicemail as directed and we will return your call as soon as possible. Messages left after 4 pm will be answered the following business day.   You may also send [place] a message via [place]. We typically respond to MyChart messages within 1-2 business days.  For prescription refills, please ask your pharmacy to contact our office. Our fax number is [phone].  If you have an urgent issue when the clinic is closed that cannot wait until the next business day, you can page your doctor at the number below.    Please note that while we do our best to be available for urgent issues outside of office hours, we are not available 24/7.   If you have an urgent issue and are unable to reach [place], you may choose to seek medical care at your doctor's office, retail clinic, urgent care center, or emergency room.  If you have a medical emergency,  please immediately call 911 or go to the emergency department.  Pager Numbers  - Dr. Nehemiah Massed: [phone]  - Dr. Laurence Ferrari: [phone]  - Dr. Nicole Kindred: [phone]  In the event of inclement weather, please call our main line at [phone] for an update on the status of any delays or closures.  Dermatology Medication Tips: Please keep the boxes that topical medications come in in order to help keep track of the instructions about where and how to use these. Pharmacies typically print the medication instructions only on the boxes and not directly on the  medication tubes.   If your medication is too expensive, please contact our office at [phone] option 4 or send [place] a message through [place].   We are unable to tell what your co-pay for medications will be in advance as this is different depending on your insurance coverage. However, we may be able to find a substitute medication at lower cost or fill out paperwork to get insurance to cover a needed medication.   If a prior authorization is required to get your medication covered by your insurance company, please allow [place] 1-2 business days to complete this process.  Drug prices often vary depending on where the prescription is filled and some pharmacies may offer cheaper prices.  The website www.goodrx.com contains coupons for medications through different pharmacies. The prices here do not account for what the cost may be with help from insurance (it may be cheaper with your insurance), but the website can give you the price if you did not use any insurance.  - You can print the associated coupon and take it with your prescription to the pharmacy.  - You may also stop by our office during regular business hours and pick up a GoodRx coupon card.  - If you need your prescription sent electronically to a different pharmacy, notify our office through [facility] or by phone at [phone] option 4.     Si Usted Necesita Algo Despus de Su Visita  Tambin puede enviarnos un mensaje a travs de Pharmacist, community. Por lo general respondemos a los mensajes de MyChart en el transcurso de 1 a 2 das hbiles.  Para renovar recetas, por favor pida a su farmacia que se ponga en contacto con nuestra oficina. Harland Dingwall de fax es [place] [phone].  Si tiene un asunto urgente cuando la clnica est cerrada y que no puede esperar hasta el siguiente da hbil, puede llamar/localizar a su doctor(a) al nmero que aparece a continuacin.   Por favor, tenga en cuenta que aunque hacemos todo lo posible  para estar disponibles para asuntos urgentes fuera del horario de [place], no estamos disponibles las 24 horas del da, los 7 das de la [place].   Si tiene un problema urgente y no puede comunicarse con nosotros, puede optar por buscar atencin mdica  en el consultorio de su doctor(a), en una clnica privada, en un centro de atencin urgente o en una sala de emergencias.  Si tiene Engineering geologist, por favor llame inmediatamente al 911 o vaya a la sala de emergencias.  Nmeros de bper  - Dr. Nehemiah Massed: [phone]  - Dra. Moye: [phone]  - Dra. Nicole Kindred: [phone]  En caso de inclemencias del [place], por favor llame a Johnsie Kindred principal al [phone] para una actualizacin sobre el [place] de cualquier retraso o cierre.  Consejos para la medicacin en dermatologa: Por favor, guarde las cajas en las que vienen los medicamentos de uso tpico para ayudarle a seguir  las instrucciones sobre dnde y cmo usarlos. Las farmacias generalmente imprimen las instrucciones del medicamento slo en las cajas y no directamente en los tubos del [place].   Si su medicamento es muy caro, por favor, pngase en contacto con Zigmund Daniel llamando al [phone] y presione la opcin 4 o envenos un mensaje a travs de Pharmacist, community.   No podemos decirle cul ser su copago por los medicamentos por adelantado ya que esto es diferente dependiendo de la cobertura de su seguro. Sin embargo, es posible que podamos encontrar un medicamento sustituto a Electrical engineer un formulario para que el seguro cubra el medicamento que se considera necesario.   Si se requiere una autorizacin previa para que su compaa de seguros [place] su medicamento, por favor permtanos de 1 a 2 das hbiles para completar este proceso.  Los precios de los medicamentos varan con frecuencia dependiendo del Environmental consultant de dnde se surte la receta y alguna farmacias pueden ofrecer precios ms baratos.  El sitio web  www.goodrx.com tiene cupones para medicamentos de Airline pilot. Los precios aqu no tienen en cuenta lo que podra costar con la ayuda del seguro (puede ser ms barato con su seguro), pero el sitio web puede darle el precio si no utiliz Research scientist (physical sciences).  - Puede imprimir el cupn correspondiente y llevarlo con su receta a la farmacia.  - Tambin puede pasar por nuestra oficina durante el horario de atencin regular y Charity fundraiser una tarjeta de cupones de GoodRx.  - Si necesita que su receta se enve electrnicamente a una farmacia diferente, informe a nuestra oficina a travs de MyChart de [place] o por telfono llamando al [phone] y presione la opcin 4.    Cryotherapy Aftercare  Wash gently with soap and water everyday.   Apply Vaseline and Band-Aid daily until healed.    Due to recent changes in healthcare laws, you may see results of your pathology and/or laboratory studies on MyChart before the doctors have had a chance to review them. We understand that in some cases there may be results that are confusing or concerning to you. Please understand that not all results are received at the same time and often the doctors may need to interpret multiple results in order to provide you with the best plan of care or course of treatment. Therefore, we ask that you please give [place] 2 business days to thoroughly review all your results before contacting the office for clarification. Should we see a critical lab result, you will be contacted sooner.   If You Need Anything After Your Visit  If you have any questions or concerns for your doctor, please call our main line at [phone] and press option 4 to reach your doctor's medical assistant. If no one answers, please leave a voicemail as directed and we will return your call as soon as possible. Messages left after 4 pm will be answered the following business day.   You may also send [place] a message via [place]. We typically respond to MyChart  messages within 1-2 business days.  For prescription refills, please ask your pharmacy to contact our office. Our fax number is [phone].  If you have an urgent issue when the clinic is closed that cannot wait until the next business day, you can page your doctor at the number below.    Please note that while we do our best to be available for urgent issues outside of office hours, we are not available 24/7.   If  you have an urgent issue and are unable to reach [place], you may choose to seek medical care at your doctor's office, retail clinic, urgent care center, or emergency room.  If you have a medical emergency, please immediately call 911 or go to the emergency department.  Pager Numbers  - Dr. Nehemiah Massed: [phone]  - Dr. Laurence Ferrari: [phone]  - Dr. Nicole Kindred: [phone]  In the event of inclement weather, please call our main line at [phone] for an update on the status of any delays or closures.  Dermatology Medication Tips: Please keep the boxes that topical medications come in in order to help keep track of the instructions about where and how to use these. Pharmacies typically print the medication instructions only on the boxes and not directly on the medication tubes.   If your medication is too expensive, please contact our office at [phone] option 4 or send [place] a message through [place].   We are unable to tell what your co-pay for medications will be in advance as this is different depending on your insurance coverage. However, we may be able to find a substitute medication at lower cost or fill out paperwork to get insurance to cover a needed medication.   If a prior authorization is required to get your medication covered by your insurance company, please allow [place] 1-2 business days to complete this process.  Drug prices often vary depending on where the prescription is filled and some pharmacies may offer cheaper prices.  The website www.goodrx.com contains  coupons for medications through different pharmacies. The prices here do not account for what the cost may be with help from insurance (it may be cheaper with your insurance), but the website can give you the price if you did not use any insurance.  - You can print the associated coupon and take it with your prescription to the pharmacy.  - You may also stop by our office during regular business hours and pick up a GoodRx coupon card.  - If you need your prescription sent electronically to a different pharmacy, notify our office through [facility] or by phone at [phone] option 4.     Si Usted Necesita Algo Despus de Su Visita  Tambin puede enviarnos un mensaje a travs de Pharmacist, community. Por lo general respondemos a los mensajes de MyChart en el transcurso de 1 a 2 das hbiles.  Para renovar recetas, por favor pida a su farmacia que se ponga en contacto con nuestra oficina. Harland Dingwall de fax es [place] [phone].  Si tiene un asunto urgente cuando la clnica est cerrada y que no puede esperar hasta el siguiente da hbil, puede llamar/localizar a su doctor(a) al nmero que aparece a continuacin.   Por favor, tenga en cuenta que aunque hacemos todo lo posible para estar disponibles para asuntos urgentes fuera del horario de [place], no estamos disponibles las 24 horas del da, los 7 das de la [place].   Si tiene un problema urgente y no puede comunicarse con nosotros, puede optar por buscar atencin mdica  en el consultorio de su doctor(a), en una clnica privada, en un centro de atencin urgente o en una sala de emergencias.  Si tiene Engineering geologist, por favor llame inmediatamente al 911 o vaya a la sala de emergencias.  Nmeros de bper  - Dr. Nehemiah Massed: [phone]  - Dra. Moye: [phone]  - Dra. Nicole Kindred: [phone]  En caso de inclemencias del tiempo, por favor llame a nuestra lnea principal al [phone]  para [employer] de  cualquier retraso o cierre.  Consejos para la medicacin en dermatologa: Por favor, guarde las cajas en las que vienen los medicamentos de uso tpico para ayudarle a seguir las instrucciones sobre dnde y cmo usarlos. Las farmacias generalmente imprimen las instrucciones del medicamento slo en las cajas y no directamente en los tubos del [place].   Si su medicamento es muy caro, por favor, pngase en contacto con Zigmund Daniel llamando al [phone] y presione la opcin 4 o envenos un mensaje a travs de Pharmacist, community.   No podemos decirle cul ser su copago por los medicamentos por adelantado ya que esto es diferente dependiendo de la cobertura de su seguro. Sin embargo, es posible que podamos encontrar un medicamento sustituto a Electrical engineer un formulario para que el seguro cubra el medicamento que se considera necesario.   Si se requiere una autorizacin previa para que su compaa de seguros [place] su medicamento, por favor permtanos de 1 a 2 das hbiles para completar este proceso.  Los precios de los medicamentos varan con frecuencia dependiendo del Environmental consultant de dnde se surte la receta y alguna farmacias pueden ofrecer precios ms baratos.  El sitio web www.goodrx.com tiene cupones para medicamentos de Airline pilot. Los precios aqu no tienen en cuenta lo que podra costar con la ayuda del seguro (puede ser ms barato con su seguro), pero el sitio web puede darle el precio si no utiliz Research scientist (physical sciences).  - Puede imprimir el cupn correspondiente y llevarlo con su receta a la farmacia.  - Tambin puede pasar por nuestra oficina durante el horario de atencin regular y Charity fundraiser una tarjeta de cupones de GoodRx.  - Si necesita que su receta se enve electrnicamente a una farmacia diferente, informe a nuestra oficina a travs de MyChart de [place] o por telfono llamando al [phone] y presione la opcin 4.

## 2022-08-02 NOTE — Progress Notes (Signed)
Follow-Up Visit   Subjective  Grant Diaz is a 71 y.o. male who presents for the following: Actinic Keratosis (6 months f/u on precancers on his scalp ) and Rash (Recheck his back and legs, eczema treating with Triamcinolone cream helping on his legs, no help on his back.). Refill Ketoconazole cream for intertrigo in the groin area.  The patient has spots, moles and lesions to be evaluated, some may be new or changing and the patient has concerns that these could be cancer.  The following portions of the chart were reviewed this encounter and updated as appropriate:   Tobacco  Allergies  Meds  Problems  Med Hx  Surg Hx  Fam Hx     Review of Systems:  No other skin or systemic complaints except as noted in HPI or Assessment and Plan.  Objective  Well appearing patient in no apparent distress; mood and affect are within normal limits.  A focused examination was performed including back,scalp,legs. Relevant physical exam findings are noted in the Assessment and Plan.  left mid back 2.5 cm Pink scaly patch        Scalp x 12 (12) Erythematous thin papules/macules with gritty scale.   legs Scaly erythematous papules and patches +/- dyspigmentation, lichenification, excoriations.   groin Clear skin per patient    Assessment & Plan  Rash left mid back Skin / nail biopsy - left mid back Type of biopsy: tangential   Informed consent: discussed and consent obtained   Patient was prepped and draped in usual sterile fashion: area prepped with alochol. Anesthesia: the lesion was anesthetized in a standard fashion   Anesthetic:  1% lidocaine w/ epinephrine 1-100,000 buffered w/ 8.4% NaHCO3 Instrument used: flexible razor blade   Hemostasis achieved with: pressure, aluminum chloride and electrodesiccation   Outcome: patient tolerated procedure well   Post-procedure details: wound care instructions given   Post-procedure details comment:  Ointment and small  bandage  Specimen 1 - Surgical pathology Differential Diagnosis: R/O eczema vs other  Check Margins: No  AK (actinic keratosis) (12) Scalp x 12 Actinic keratoses are precancerous spots that appear secondary to cumulative UV radiation exposure/sun exposure over time. They are chronic with expected duration over 1 year. A portion of actinic keratoses will progress to squamous cell carcinoma of the skin. It is not possible to reliably predict which spots will progress to skin cancer and so treatment is recommended to prevent development of skin cancer.  Recommend daily broad spectrum sunscreen SPF 30+ to sun-exposed areas, reapply every 2 hours as needed.  Recommend staying in the shade or wearing long sleeves, sun glasses (UVA+UVB protection) and wide brim hats (4-inch brim around the entire circumference of the hat). Call for new or changing lesions.   Destruction of lesion - Scalp x 12 Complexity: simple   Destruction method: cryotherapy   Informed consent: discussed and consent obtained   Timeout:  patient name, date of birth, surgical site, and procedure verified Lesion destroyed using liquid nitrogen: Yes   Region frozen until ice ball extended beyond lesion: Yes   Outcome: patient tolerated procedure well with no complications   Post-procedure details: wound care instructions given    Other atopic dermatitis legs Atopic dermatitis (eczema) is a chronic, relapsing, pruritic condition that can significantly affect quality of life. It is often associated with allergic rhinitis and/or asthma and can require treatment with topical medications, phototherapy, or in severe cases biologic injectable medication (Dupixent; Adbry) or Oral JAK inhibitors.  Chronic  and persistent condition with duration or expected duration over one year. Condition is symptomatic / bothersome to patient. Not to goal.  Continue Triamcinolone cream qd-bid prn  Start otc Cerave cream daily   Erythema  intertrigo groin Intertrigo is a chronic recurrent rash that occurs in skin fold areas that may be associated with friction; heat; moisture; yeast; fungus; and bacteria.  It is exacerbated by increased movement / activity; sweating; and higher atmospheric temperature.  Chronic and persistent condition with duration or expected duration over one year. Condition is symptomatic / bothersome to patient. Not to goal.  Re-start Ketoconazole cream apply to affected skin qd  Purpura - Chronic; persistent and recurrent.  Treatable, but not curable. - Violaceous macules and patches - Benign - Related to trauma, age, sun damage and/or use of blood thinners, chronic use of topical and/or oral steroids - Observe - Can use OTC arnica containing moisturizer such as Dermend Bruise Formula if desired - Call for worsening or other concerns   Actinic Damage - chronic, secondary to cumulative UV radiation exposure/sun exposure over time - diffuse scaly erythematous macules with underlying dyspigmentation - Recommend daily broad spectrum sunscreen SPF 30+ to sun-exposed areas, reapply every 2 hours as needed.  - Recommend staying in the shade or wearing long sleeves, sun glasses (UVA+UVB protection) and wide brim hats (4-inch brim around the entire circumference of the hat). - Call for new or changing lesions.   Return in about 6 months (around 02/01/2023) for Aks .  IMarye Round, CMA, am acting as scribe for Sarina Ser, MD .  Documentation: I have reviewed the above documentation for accuracy and completeness, and I agree with the above.  Sarina Ser, MD

## 2022-08-08 ENCOUNTER — Telehealth: Payer: Self-pay

## 2022-08-08 NOTE — Telephone Encounter (Signed)
-----   Message from David C Kowalski, MD sent at 08/08/2022 10:27 AM EDT ----- Diagnosis Skin , left mid back PSORIASIFORM SPONGIOTIC DERMATITIS, SEE DESCRIPTION  Consistent with atopic dermatitis/eczema.  Less likely contact dermatitis and id reaction. If patient is not doing well with the rash, would recommend make an appointment for him on Monday or Tuesday for reevaluation and possible patch testing. 

## 2022-08-08 NOTE — Telephone Encounter (Signed)
Left message on voicemail to return my call.  

## 2022-08-09 ENCOUNTER — Encounter: Payer: Self-pay | Admitting: Dermatology

## 2022-08-14 ENCOUNTER — Other Ambulatory Visit: Payer: Self-pay | Admitting: Nurse Practitioner

## 2022-08-15 NOTE — Telephone Encounter (Signed)
Requested Prescriptions  Pending Prescriptions Disp Refills  . celecoxib (CELEBREX) 100 MG capsule [Pharmacy Med Name: Celecoxib 100 MG Oral Capsule] 90 capsule 0    Sig: TAKE 1 CAPSULE BY MOUTH ONCE DAILY AS NEEDED.  USE MINIMALLY SINCE YOU ARE TAKING XARELTO     Analgesics:  COX2 Inhibitors Failed - 08/14/2022  9:38 AM      Failed - Manual Review: Labs are only required if the patient has taken medication for more than 8 weeks.      Passed - HGB in normal range and within 360 days    Hemoglobin  Date Value Ref Range Status  11/25/2021 13.2 13.0 - 17.7 g/dL Final         Passed - Cr in normal range and within 360 days    Creatinine, Ser  Date Value Ref Range Status  05/25/2022 0.98 0.76 - 1.27 mg/dL Final         Passed - HCT in normal range and within 360 days    Hematocrit  Date Value Ref Range Status  11/25/2021 40.2 37.5 - 51.0 % Final         Passed - AST in normal range and within 360 days    AST  Date Value Ref Range Status  05/25/2022 24 0 - 40 IU/L Final         Passed - ALT in normal range and within 360 days    ALT  Date Value Ref Range Status  05/25/2022 15 0 - 44 IU/L Final         Passed - eGFR is 30 or above and within 360 days    GFR calc Af Amer  Date Value Ref Range Status  11/24/2020 101 >59 mL/min/1.73 Final    Comment:    **In accordance with recommendations from the NKF-ASN Task force,**   [facility] is in the process of updating its eGFR calculation to the   2021 CKD-EPI creatinine equation that estimates kidney function   without a race variable.    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

## 2022-09-18 ENCOUNTER — Telehealth: Payer: Self-pay

## 2022-09-18 NOTE — Telephone Encounter (Signed)
Left message for patient to call office regarding pathology results/hd

## 2022-09-18 NOTE — Telephone Encounter (Signed)
-----   Message from Ralene Bathe, MD sent at 08/08/2022 10:27 AM EDT ----- Diagnosis Skin , left mid back PSORIASIFORM SPONGIOTIC DERMATITIS, SEE DESCRIPTION  Consistent with atopic dermatitis/eczema.  Less likely contact dermatitis and id reaction. If patient is not doing well with the rash, would recommend make an appointment for him on Monday or Tuesday for reevaluation and possible patch testing.

## 2022-10-10 ENCOUNTER — Telehealth: Payer: Self-pay

## 2022-10-10 NOTE — Telephone Encounter (Signed)
Left message on voicemail to return my call.  

## 2022-10-10 NOTE — Telephone Encounter (Signed)
-----   Message from Ralene Bathe, MD sent at 08/08/2022 10:27 AM EDT ----- Diagnosis Skin , left mid back PSORIASIFORM SPONGIOTIC DERMATITIS, SEE DESCRIPTION  Consistent with atopic dermatitis/eczema.  Less likely contact dermatitis and id reaction. If patient is not doing well with the rash, would recommend make an appointment for him on Monday or Tuesday for reevaluation and possible patch testing.

## 2022-10-27 DIAGNOSIS — I4891 Unspecified atrial fibrillation: Secondary | ICD-10-CM | POA: Diagnosis not present

## 2022-11-05 NOTE — Patient Instructions (Signed)

## 2022-11-07 ENCOUNTER — Encounter: Payer: Self-pay | Admitting: Nurse Practitioner

## 2022-11-07 ENCOUNTER — Ambulatory Visit: Payer: Medicare PPO | Admitting: Nurse Practitioner

## 2022-11-07 VITALS — BP 138/84 | HR 62 | Temp 97.7°F | Ht 67.99 in | Wt 261.5 lb

## 2022-11-07 DIAGNOSIS — I4891 Unspecified atrial fibrillation: Secondary | ICD-10-CM | POA: Diagnosis not present

## 2022-11-07 DIAGNOSIS — E669 Obesity, unspecified: Secondary | ICD-10-CM | POA: Diagnosis not present

## 2022-11-07 DIAGNOSIS — I351 Nonrheumatic aortic (valve) insufficiency: Secondary | ICD-10-CM | POA: Diagnosis not present

## 2022-11-07 DIAGNOSIS — K219 Gastro-esophageal reflux disease without esophagitis: Secondary | ICD-10-CM | POA: Diagnosis not present

## 2022-11-07 DIAGNOSIS — Z0289 Encounter for other administrative examinations: Secondary | ICD-10-CM | POA: Diagnosis not present

## 2022-11-07 DIAGNOSIS — E782 Mixed hyperlipidemia: Secondary | ICD-10-CM | POA: Diagnosis not present

## 2022-11-07 DIAGNOSIS — I340 Nonrheumatic mitral (valve) insufficiency: Secondary | ICD-10-CM | POA: Diagnosis not present

## 2022-11-07 DIAGNOSIS — G4730 Sleep apnea, unspecified: Secondary | ICD-10-CM | POA: Diagnosis not present

## 2022-11-07 DIAGNOSIS — I480 Paroxysmal atrial fibrillation: Secondary | ICD-10-CM

## 2022-11-07 DIAGNOSIS — I10 Essential (primary) hypertension: Secondary | ICD-10-CM | POA: Diagnosis not present

## 2022-11-07 DIAGNOSIS — I2699 Other pulmonary embolism without acute cor pulmonale: Secondary | ICD-10-CM | POA: Diagnosis not present

## 2022-11-07 NOTE — Progress Notes (Signed)
BP 138/84   Pulse 62   Temp 97.7 F (36.5 C) (Oral)   Ht 5' 7.99" (1.727 m)   Wt 261 lb 8 oz (118.6 kg)   SpO2 98%   BMI 39.77 kg/m    Subjective:    Patient ID: Grant Diaz, male    DOB: 05/22/1951, 72 y.o.   MRN: [id number]  HPI: Grant Diaz is a 72 y.o. 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  Needs forms completed for court date in March, cardiology filled out there aspect on forms.    Relevant past medical, surgical, family and social history reviewed and updated as indicated. Interim medical history since our last visit reviewed. Allergies and medications reviewed and updated.  Review of Systems  Constitutional:  Negative for chills, fatigue and fever.  Respiratory:  Negative for cough, chest tightness, shortness of breath and wheezing.   Cardiovascular:  Negative for chest pain and palpitations.  Gastrointestinal: Negative.   Allergic/Immunologic: Negative for environmental allergies.  Neurological: Negative.   Psychiatric/Behavioral: Negative.      Per HPI unless specifically indicated above     Objective:    BP 138/84   Pulse 62   Temp 97.7 F (36.5 C) (Oral)   Ht 5' 7.99" (1.727 m)   Wt 261 lb 8 oz (118.6 kg)   SpO2 98%   BMI 39.77 kg/m   Wt Readings from Last 3 Encounters:  11/07/22 261 lb 8 oz (118.6 kg)  05/25/22 264 lb 11.2 oz (120.1 kg)  02/15/22 263 lb 12.8 oz (119.7 kg)    Physical Exam Vitals and nursing note reviewed.  Constitutional:      General: He is awake. He is not in acute distress.    Appearance: He is well-developed and well-groomed. He is obese. He is not ill-appearing.  HENT:     Head:  Normocephalic and atraumatic.     Right Ear: Hearing, tympanic membrane, ear canal and external ear normal. No drainage.     Left Ear: Hearing, tympanic membrane, ear canal and external ear normal. No drainage.     Nose: Nose normal.     Mouth/Throat:     Pharynx: Uvula midline.  Eyes:     General: Lids are normal.        Right eye: No discharge.        Left eye: No discharge.     Extraocular Movements: Extraocular movements intact.     Conjunctiva/sclera: Conjunctivae normal.     Pupils: Pupils are equal, round, and reactive to light.     Visual Fields: Right eye visual fields normal and left eye visual fields normal.  Neck:     Thyroid: No thyromegaly.     Vascular: No carotid bruit or JVD.     Trachea: Trachea normal.  Cardiovascular:     Rate and Rhythm: Normal rate and regular rhythm.     Heart sounds: Normal heart sounds, S1 normal and S2 normal. No murmur heard.    No gallop.  Pulmonary:     Effort: Pulmonary effort is normal. No  accessory muscle usage or respiratory distress.     Breath sounds: Normal breath sounds.  Abdominal:     General: Bowel sounds are normal.     Palpations: Abdomen is soft. There is no hepatomegaly or splenomegaly.     Tenderness: There is no abdominal tenderness.  Musculoskeletal:        General: Normal range of motion.     Cervical back: Normal range of motion and neck supple.     Right lower leg: No edema.     Left lower leg: No edema.  Lymphadenopathy:     Head:     Right side of head: No submental, submandibular, tonsillar, preauricular or posterior auricular adenopathy.     Left side of head: No submental, submandibular, tonsillar, preauricular or posterior auricular adenopathy.     Cervical: No cervical adenopathy.  Skin:    General: Skin is warm and dry.     Capillary Refill: Capillary refill takes less than 2 seconds.     Findings: Bruising present. No rash.     Comments: Scattered purple bruises to upper extremities.  Neurological:      Mental Status: He is alert and oriented to person, place, and time.     Gait: Gait is intact.     Deep Tendon Reflexes: Reflexes are normal and symmetric.     Reflex Scores:      Brachioradialis reflexes are 2+ on the right side and 2+ on the left side.      Patellar reflexes are 2+ on the right side and 2+ on the left side. Psychiatric:        Attention and Perception: Attention normal.        Mood and Affect: Mood normal.        Speech: Speech normal.        Behavior: Behavior normal. Behavior is cooperative.        Thought Content: Thought content normal.        Cognition and Memory: Cognition normal.        Judgment: Judgment normal.     Results for orders placed or performed in visit on 05/25/22  Comprehensive metabolic panel  Result Value Ref Range   Glucose 97 70 - 99 mg/dL   BUN 22 8 - 27 mg/dL   Creatinine, Ser 0.98 0.76 - 1.27 mg/dL   eGFR 82 >59 mL/min/1.73   BUN/Creatinine Ratio 22 10 - 24   Sodium 139 134 - 144 mmol/L   Potassium 3.7 3.5 - 5.2 mmol/L   Chloride 102 96 - 106 mmol/L   CO2 23 20 - 29 mmol/L   Calcium 9.2 8.6 - 10.2 mg/dL   Total Protein 6.6 6.0 - 8.5 g/dL   Albumin 4.0 3.8 - 4.8 g/dL   Globulin, Total 2.6 1.5 - 4.5 g/dL   Albumin/Globulin Ratio 1.5 1.2 - 2.2   Bilirubin Total 0.5 0.0 - 1.2 mg/dL   Alkaline Phosphatase 91 44 - 121 IU/L   AST 24 0 - 40 IU/L   ALT 15 0 - 44 IU/L  Lipid Panel w/o Chol/HDL Ratio  Result Value Ref Range   Cholesterol, Total 110 100 - 199 mg/dL   Triglycerides 71 0 - 149 mg/dL   HDL 42 >39 mg/dL   VLDL Cholesterol Cal 15 5 - 40 mg/dL   LDL Chol Calc (NIH) 53 0 - 99 mg/dL      Assessment & Plan:   Problem List Items Addressed This Visit  Cardiovascular and Mediastinum   Atrial fibrillation ([place]) - Primary    Chronic with rate controlled today.  Diagnosed by cardiology. Dr. Humphrey Rolls, prior to surgery 05/31/21.  No current BB or rate control.  Continue Xarelto as ordered.        Other Visit Diagnoses      Encounter for completion of form with patient       Reviewed DOT forms with patient and signed.        Follow up plan: Return if symptoms worsen or fail to improve.

## 2022-11-07 NOTE — Assessment & Plan Note (Signed)
Chronic with rate controlled today.  Diagnosed by cardiology. Dr. Humphrey Rolls, prior to surgery 05/31/21.  No current BB or rate control.  Continue Xarelto as ordered.

## 2022-11-07 NOTE — Progress Notes (Deleted)
[id number]

## 2022-11-09 ENCOUNTER — Other Ambulatory Visit: Payer: Self-pay | Admitting: Nurse Practitioner

## 2022-11-09 NOTE — Telephone Encounter (Signed)
Requested Prescriptions  Pending Prescriptions Disp Refills   celecoxib (CELEBREX) 100 MG capsule [Pharmacy Med Name: Celecoxib 100 MG Oral Capsule] 90 capsule 0    Sig: TAKE 1 CAPSULE BY MOUTH ONCE DAILY AS NEEDED -  USE  MINIMALLY  SINCE  TAKING  XARELTO     Analgesics:  COX2 Inhibitors Failed - 11/09/2022 10:10 AM      Failed - Manual Review: Labs are only required if the patient has taken medication for more than 8 weeks.      Passed - HGB in normal range and within 360 days    Hemoglobin  Date Value Ref Range Status  11/25/2021 13.2 13.0 - 17.7 g/dL Final         Passed - Cr in normal range and within 360 days    Creatinine, Ser  Date Value Ref Range Status  05/25/2022 0.98 0.76 - 1.27 mg/dL Final         Passed - HCT in normal range and within 360 days    Hematocrit  Date Value Ref Range Status  11/25/2021 40.2 37.5 - 51.0 % Final         Passed - AST in normal range and within 360 days    AST  Date Value Ref Range Status  05/25/2022 24 0 - 40 IU/L Final         Passed - ALT in normal range and within 360 days    ALT  Date Value Ref Range Status  05/25/2022 15 0 - 44 IU/L Final         Passed - eGFR is 30 or above and within 360 days    GFR calc Af Amer  Date Value Ref Range Status  11/24/2020 101 >59 mL/min/1.73 Final    Comment:    **In accordance with recommendations from the NKF-ASN Task force,**   [facility] is in the process of updating its eGFR calculation to the   2021 CKD-EPI creatinine equation that estimates kidney function   without a race variable.    GFR, Estimated  Date Value Ref Range Status  06/01/2021 >60 >60 mL/min Final    Comment:    (NOTE) Calculated using the CKD-EPI Creatinine Equation (2021)    eGFR  Date Value Ref Range Status  05/25/2022 82 >59 mL/min/1.73 Final         Passed - Patient is not pregnant      Passed - Valid encounter within last 12 months    Recent Outpatient Visits           2 days ago Paroxysmal atrial  fibrillation ([place])   [place], Jolene T, NP   5 months ago Paroxysmal atrial fibrillation ([place])   [place], [place] T, NP   8 months ago Programme researcher, broadcasting/film/video and seasonal allergies   [facility] [place], [place] T, NP   9 months ago Programme researcher, broadcasting/film/video and seasonal allergies   [facility] [place], [place] T, NP   11 months ago Paroxysmal atrial fibrillation ([place])   [place], Barbaraann Faster, NP       Future Appointments             In 2 weeks Venita Lick, NP [facility], [place]   In 3 months Ralene Bathe, MD [place]

## 2022-11-27 ENCOUNTER — Encounter: Payer: Medicare PPO | Admitting: Nurse Practitioner

## 2022-12-10 NOTE — Patient Instructions (Signed)

## 2022-12-15 ENCOUNTER — Encounter: Payer: Self-pay | Admitting: Nurse Practitioner

## 2022-12-15 ENCOUNTER — Ambulatory Visit: Payer: Medicare PPO | Admitting: Nurse Practitioner

## 2022-12-15 VITALS — BP 134/87 | HR 64 | Temp 97.4°F | Ht 67.99 in | Wt 261.3 lb

## 2022-12-15 DIAGNOSIS — K2270 Barrett's esophagus without dysplasia: Secondary | ICD-10-CM

## 2022-12-15 DIAGNOSIS — D6869 Other thrombophilia: Secondary | ICD-10-CM

## 2022-12-15 DIAGNOSIS — I10 Essential (primary) hypertension: Secondary | ICD-10-CM | POA: Diagnosis not present

## 2022-12-15 DIAGNOSIS — G4733 Obstructive sleep apnea (adult) (pediatric): Secondary | ICD-10-CM

## 2022-12-15 DIAGNOSIS — Z8546 Personal history of malignant neoplasm of prostate: Secondary | ICD-10-CM

## 2022-12-15 DIAGNOSIS — E782 Mixed hyperlipidemia: Secondary | ICD-10-CM | POA: Diagnosis not present

## 2022-12-15 DIAGNOSIS — E559 Vitamin D deficiency, unspecified: Secondary | ICD-10-CM

## 2022-12-15 DIAGNOSIS — D692 Other nonthrombocytopenic purpura: Secondary | ICD-10-CM

## 2022-12-15 DIAGNOSIS — Z1211 Encounter for screening for malignant neoplasm of colon: Secondary | ICD-10-CM | POA: Diagnosis not present

## 2022-12-15 DIAGNOSIS — Z23 Encounter for immunization: Secondary | ICD-10-CM | POA: Diagnosis not present

## 2022-12-15 DIAGNOSIS — D696 Thrombocytopenia, unspecified: Secondary | ICD-10-CM | POA: Diagnosis not present

## 2022-12-15 DIAGNOSIS — Z0000 Encounter for general adult medical examination without abnormal findings: Secondary | ICD-10-CM | POA: Diagnosis not present

## 2022-12-15 DIAGNOSIS — I480 Paroxysmal atrial fibrillation: Secondary | ICD-10-CM | POA: Diagnosis not present

## 2022-12-15 LAB — MICROALBUMIN, URINE WAIVED
Creatinine, Urine Waived: 50 mg/dL (ref 10–300)
Microalb, Ur Waived: 10 mg/L (ref 0–19)

## 2022-12-15 MED ORDER — ROSUVASTATIN CALCIUM 10 MG PO TABS: ORAL_TABLET | ORAL | 4 refills | Status: DC

## 2022-12-15 MED ORDER — OMEPRAZOLE 20 MG PO CPDR: 20.0000 mg | DELAYED_RELEASE_CAPSULE | Freq: Every day | ORAL | 4 refills | Status: DC

## 2022-12-15 MED ORDER — LISINOPRIL-HYDROCHLOROTHIAZIDE 20-25 MG PO TABS: 1.0000 | ORAL_TABLET | Freq: Every day | ORAL | 4 refills | Status: DC

## 2022-12-15 MED ORDER — AMLODIPINE BESYLATE 10 MG PO TABS: 10.0000 mg | ORAL_TABLET | Freq: Every day | ORAL | 4 refills | Status: DC

## 2022-12-15 NOTE — Assessment & Plan Note (Signed)
Noted on labs October 2022, recent level February 2023 was stable at 185 -- recheck CBC today and monitor closely.

## 2022-12-15 NOTE — Assessment & Plan Note (Signed)
Chronic with rate controlled.  Diagnosed by cardiology. Dr. Humphrey Rolls, prior to surgery 05/31/21.  No current BB or rate control medication.  Continue Xarelto as ordered. Will reach out to get Dr. Laurelyn Sickle recent notes and echo.

## 2022-12-15 NOTE — Assessment & Plan Note (Signed)
Ongoing.  Noted on exam, discussed with patient.  Ensure gentle skin care and monitor for wounds, alert provider if they present.

## 2022-12-15 NOTE — Assessment & Plan Note (Signed)
Chronic, stable, noted on EGD in 2022.   Continue current medication regimen and adjust as needed, lifelong PPI.  Mag level annually. Risks of PPI use were discussed with patient including bone loss, C. Diff diarrhea, pneumonia, infections, CKD, electrolyte abnormalities.  Verbalizes understanding and chooses to continue the medication.

## 2022-12-15 NOTE — Assessment & Plan Note (Signed)
BMI 39.74 with A-fib, HTN/HLD.  Recommended eating smaller high protein, low fat meals more frequently and exercising 30 mins a day 5 times a week with a goal of 10-15lb weight loss in the next 3 months. Patient voiced their understanding and motivation to adhere to these recommendations.  Can not use GLP1 due to past pancreatitis issues.

## 2022-12-15 NOTE — Assessment & Plan Note (Signed)
Chronic, stable. Continue with CPAP QHS, 100% use

## 2022-12-15 NOTE — Assessment & Plan Note (Signed)
Due to A-Fib and Xarelto.  CBC annually and monitor for bleeding.

## 2022-12-15 NOTE — Assessment & Plan Note (Signed)
Chronic, ongoing.  Poor tolerance of daily Crestor at low dose.  Will continue 3 day a week dosing at this time.  Lipid panel today.  Educated patient on statin use and side effects.  Could consider Zetia if elevations noted.

## 2022-12-15 NOTE — Assessment & Plan Note (Signed)
Chronic, stable with BP at goal for age. Recommend he monitor BP at least a few mornings a week at home and document.  DASH diet at home.  Continue current medication regimen and adjust as needed based on levels.  Labs today: CMP, CBC, urine ALB, TSH.  Urine ALB 10 (February 2024).  Refills sent.  Return in 6 months.

## 2022-12-15 NOTE — Assessment & Plan Note (Addendum)
Stable.  PSA today. Continue to collaborate with Dr. Bernardo Heater as needed, patient wishes PSA result relayed to him.

## 2022-12-15 NOTE — Progress Notes (Signed)
BP 134/87   Pulse 64   Temp (!) 97.4 F (36.3 C) (Oral)   Ht 5' 7.99" (1.727 m)   Wt 261 lb 4.8 oz (118.5 kg)   SpO2 99%   BMI 39.74 kg/m    Subjective:    Patient ID: Grant Diaz, male    DOB: 1951/05/13, 73 y.o.   MRN: [id number]  HPI: Grant Diaz is a 72 y.o. male presenting on 12/15/2022 for comprehensive medical examination. Current medical complaints include:none.  He currently lives with: wife Interim Problems from his last visit: no  HYPERTENSION & A - FIB & HLD Continues on Zestoretic 20-25, Xarelto, Amlodipine, and Crestor.  Echo 11/17/21 -- noted trace MR/TR and LVEF 56%.  Is followed by Dr. Humphrey Rolls with last visit about one month ago.    Uses CPAP every night. H/O pulmonary embolism. Endorses wearing compression stockings daily and avoidance of sitting for long periods of time. Low Vitamin D levels in past. Hypertension status: controlled  Satisfied with current treatment? yes Duration of hypertension: chronic BP monitoring frequency: rarely BP range: 130/70-80 range BP medication side effects:  no Medication compliance: good compliance Aspirin: no Recurrent headaches: no Visual changes: no Palpitations: no Dyspnea: no Chest pain: no Lower extremity edema: no Dizzy/lightheaded: no  The ASCVD Risk score (Arnett DK, et al., 2019) failed to calculate for the following reasons:   The valid total cholesterol range is 130 to 320 mg/dL  GERD/BARRET'S Has had EGD in past with hiatal hernia. Had repair of this on 05/31/21 with lap band removal.    Taking Prilosec every day which is helping.  In 2010 CT showed possible pancreatic mass, but MRI showed no mass.  Last saw GI 08/09/21.  Has history of pancreatitis. GERD control status: controlled  Satisfied with current treatment? yes Heartburn frequency: 3-4 times a week in evening Medication side effects: no  Medication compliance: stable Previous GERD medications: Antacid use frequency: yes Aggravating factors:  larger meals, greasy food Dysphagia: no  Odynophagia:  no Hematemesis: no Blood in stool: no EGD: yes   Hx PROSTATE CANCER Followed by urology, Dr. Bernardo Heater, for hx of prostate cancer - had external and internal radiation (seeds). Saw him last in September 2021, to return as needed.  Current plan is to watch closely.  Urology notes reviewed, patient requests PSA in house today and to forward result to Dr. Bernardo Heater. Duration: chronic Nocturia: 2-3x per night Urinary frequency:no Incomplete voiding: no Urgency: occasional Weak urinary stream: no Straining to start stream: no Dysuria: no Onset: gradual Severity: mild  Functional Status Survey: Is the patient deaf or have difficulty hearing?: No Does the patient have difficulty seeing, even when wearing glasses/contacts?: No Does the patient have difficulty concentrating, remembering, or making decisions?: No Does the patient have difficulty walking or climbing stairs?: No Does the patient have difficulty dressing or bathing?: No Does the patient have difficulty doing errands alone such as visiting a doctor's office or shopping?: No  FALL RISK:    12/15/2022   10:44 AM 11/07/2022    3:22 PM 05/25/2022   10:47 AM 02/15/2022   10:46 AM 11/25/2021    8:24 AM  [place] in the past year? 0 1 0 0 0  Number falls in past yr: 0  0 0 0  Injury with Fall? 0  0 0 0  Risk for fall due to : No Fall Risks History of fall(s) No Fall Risks No Fall Risks No Fall  Risks  Follow up Falls evaluation completed Falls evaluation completed Falls evaluation completed Falls evaluation completed Falls prevention discussed   Depression Screen    12/15/2022   10:45 AM 11/07/2022    3:22 PM 05/25/2022   10:47 AM 04/18/2022    1:07 PM 02/15/2022   10:46 AM  Depression screen PHQ 2/9  Decreased Interest 0 0 0 0 0  Down, Depressed, Hopeless 0 0 0 0 0  PHQ - 2 Score 0 0 0 0 0  Altered sleeping 0 0 0  0  Tired, decreased energy 1 0 1  0  Change in  appetite 0 0 0  1  Feeling bad or failure about yourself  0 0 0  0  Trouble concentrating 0 0 0  0  Moving slowly or fidgety/restless 0 0 0  0  Suicidal thoughts 0 0 0  0  PHQ-9 Score 1 0 1  1  Difficult doing work/chores  Not difficult at all Somewhat difficult       Past Medical History:  Past Medical History:  Diagnosis Date   Arthritis    Atrial fibrillation ([place])    Barrett's esophagus    Cancer ([place]) 2008   Prostate-radiation   Cataract 2019   not yet significant enough to warrant surgery   Dysrhythmia    Afib   GERD (gastroesophageal reflux disease)    History of DVT (deep vein thrombosis)    History of hiatal hernia    History of kidney stones    Hypertension    Joint pain    Mixed hyperlipidemia    Pancreatitis 2013   [facility]   Prostate cancer [facility])    Pulmonary embolus ([place]) 12/2014   Bilateral pulmonary embolus, [facility]   Sleep apnea 2010   use CPAP regularly   Sleep apnea, obstructive    cpap    Surgical History:  Past Surgical History:  Procedure Laterality Date   CARPAL TUNNEL RELEASE Right 03/15/2017   Procedure: CARPAL TUNNEL RELEASE;  Surgeon: Hessie Knows, MD;  Location: [facility] ORS;  Service: Orthopedics;  Laterality: Right;   CHOLECYSTECTOMY     COLONOSCOPY WITH PROPOFOL N/A 04/02/2018   Procedure: COLONOSCOPY WITH PROPOFOL;  Surgeon: Lucilla Lame, MD;  [facility] ENDOSCOPY;  Service: Endoscopy;  Laterality: N/A;   ESOPHAGOGASTRODUODENOSCOPY N/A 05/28/2013   Procedure: ESOPHAGOGASTRODUODENOSCOPY (EGD);  Surgeon: Shann Medal, MD;  Location: [facility] ENDOSCOPY;  Service: General;  Laterality: N/A;   ESOPHAGOGASTRODUODENOSCOPY (EGD) WITH PROPOFOL N/A 02/08/2021   Procedure: ESOPHAGOGASTRODUODENOSCOPY (EGD) WITH PROPOFOL;  Surgeon: Virgel Manifold, MD;  Location: [facility] ENDOSCOPY;  Service: Endoscopy;  Laterality: N/A;   GALLBLADDER SURGERY  2003   GANGLION CYST EXCISION Right 03/15/2017   Procedure: REMOVAL GANGLION OF WRIST;  Surgeon: Hessie Knows,  MD;  Location: [facility] ORS;  Service: Orthopedics;  Laterality: Right;   HERNIA REPAIR     HIATAL HERNIA REPAIR N/A 05/31/2021   Procedure: LAPAROSCOPIC REPAIR OF HIATAL HERNIA WITH LAP BAND REMOVAL;  Surgeon: Kieth Brightly Arta Bruce, MD;  Location: [facility] ORS;  Service: General;  Laterality: N/A;   JOINT REPLACEMENT     KNEE ARTHROSCOPY Bilateral [id number]   LAPAROSCOPIC GASTRIC BANDING  2009   and hernia repair   LAPAROSCOPIC REPAIR OF HIATAL HERNIA WITH LAP BAND REMOVAL   05/31/2021   RADIATION IMPLANT W/ ULTRASOUND, MALE  2005   internal/external radiation for prostate cancer   TOTAL KNEE ARTHROPLASTY Left 10/12/2015   Procedure: TOTAL KNEE ARTHROPLASTY;  Surgeon: Hessie Knows, MD;  Location: [facility]  ORS;  Service: Orthopedics;  Laterality: Left;   TOTAL KNEE ARTHROPLASTY Right 05/15/2017   Procedure: TOTAL KNEE ARTHROPLASTY;  Surgeon: Hessie Knows, MD;  Location: [facility] ORS;  Service: Orthopedics;  Laterality: Right;   UMBILICAL HERNIA REPAIR  2010    Medications:  Current Outpatient Medications on File Prior to Visit  Medication Sig   acetaminophen (TYLENOL) 325 MG tablet Take 650 mg by mouth every 6 (six) hours as needed for mild pain.   albuterol (VENTOLIN HFA) 108 (90 Base) MCG/ACT inhaler Inhale 2 puffs into the lungs every 6 (six) hours as needed for wheezing or shortness of breath.   celecoxib (CELEBREX) 100 MG capsule TAKE 1 CAPSULE BY MOUTH ONCE DAILY AS NEEDED -  USE  MINIMALLY  SINCE  TAKING  XARELTO   Cholecalciferol 25 MCG (1000 UT) capsule Take 1,000 Units by mouth daily.   ketoconazole (NIZORAL) 2 % cream Apply to groin area qd   Multiple Vitamin (MULTIVITAMIN) capsule Take 1 capsule by mouth daily.   triamcinolone cream (KENALOG) 0.1 % Apply 1 Application topically as directed. Qd up to 5 days per week to aa eczema on body until clear, avoid face, groin, axilla   XARELTO 20 MG TABS tablet Take 20 mg by mouth daily.   No current facility-administered medications on file prior to  visit.    Allergies:  Allergies  Allergen Reactions   Adhesive [Tape] Rash    "whelps", latex adhesive tape only, paper tape is ok     Social History:  Social History   Socioeconomic History   Marital status: Married    Spouse name: Not on file   Number of children: Not on file   Years of education: Not on file   Highest education level: Not on file  Occupational History   Occupation: pastor   Tobacco Use   Smoking status: Never   Smokeless tobacco: Never  Vaping Use   Vaping Use: Never used  Substance and Sexual Activity   Alcohol use: Not Currently    Comment: quit using alcohol upon entering ministry 1977   Drug use: Never   Sexual activity: Not Currently    Birth control/protection: None  Other Topics Concern   Not on file  Social History Narrative   Not on file   Social Determinants of Health   Financial Resource Strain: Low Risk  (04/18/2022)   Overall Financial Resource Strain (CARDIA)    Difficulty of Paying Living Expenses: Not hard at all  Food Insecurity: No Food Insecurity (04/18/2022)   Hunger Vital Sign    Worried About Running Out of Food in the Last Year: Never true    [place] in the Last Year: Never true  Transportation Needs: No Transportation Needs (04/15/2021)   PRAPARE - Hydrologist (Medical): No    Lack of Transportation (Non-Medical): No  Physical Activity: Inactive (04/15/2021)   Exercise Vital Sign    Days of Exercise per Week: 0 days    Minutes of Exercise per Session: 0 min  Stress: No Stress Concern Present (04/18/2022)   [place]    Feeling of Stress : Not at all  Social Connections: [place] (04/18/2022)   Social Connection and Isolation Panel [NHANES]    Frequency of Communication with Friends and Family: More than three times a week    Frequency of Social Gatherings with Friends and Family: Twice a week    Attends  Religious Services: More than 4 times per year    Active Member of Clubs or Organizations: Yes    Attends Archivist Meetings: More than 4 times per year    Marital Status: Married  Human resources officer Violence: Not on file   Social History   Tobacco Use  Smoking Status Never  Smokeless Tobacco Never   Social History   Substance and Sexual Activity  Alcohol Use Not Currently   Comment: quit using alcohol upon entering ministry 1977    Family History:  Family History  Problem Relation Age of Onset   Diabetes Mother    Arthritis Mother    Obesity Mother    Parkinson's disease Father    Hypertension Brother    Arthritis Brother    Asthma Brother    Diabetes Son        type 1   Heart disease Maternal Grandmother        CHF   Stroke Maternal Grandfather    Heart disease Paternal Grandmother        CHF   Stroke Paternal Grandfather     Past medical history, surgical history, medications, allergies, family history and social history reviewed with patient today and changes made to appropriate areas of the chart.   Review of Systems - All other ROS negative except what is listed above and in the HPI.      Objective:    BP 134/87   Pulse 64   Temp (!) 97.4 F (36.3 C) (Oral)   Ht 5' 7.99" (1.727 m)   Wt 261 lb 4.8 oz (118.5 kg)   SpO2 99%   BMI 39.74 kg/m   Wt Readings from Last 3 Encounters:  12/15/22 261 lb 4.8 oz (118.5 kg)  11/07/22 261 lb 8 oz (118.6 kg)  05/25/22 264 lb 11.2 oz (120.1 kg)    Physical Exam Vitals and nursing note reviewed.  Constitutional:      General: He is awake. He is not in acute distress.    Appearance: He is well-developed and well-groomed. He is obese. He is not ill-appearing.  HENT:     Head: Normocephalic and atraumatic.     Right Ear: Hearing, tympanic membrane, ear canal and external ear normal. No drainage.     Left Ear: Hearing, tympanic membrane, ear canal and external ear normal. No drainage.     Nose: Nose  normal.     Mouth/Throat:     Pharynx: Uvula midline.  Eyes:     General: Lids are normal.        Right eye: No discharge.        Left eye: No discharge.     Extraocular Movements: Extraocular movements intact.     Conjunctiva/sclera: Conjunctivae normal.     Pupils: Pupils are equal, round, and reactive to light.     Visual Fields: Right eye visual fields normal and left eye visual fields normal.  Neck:     Thyroid: No thyromegaly.     Vascular: No carotid bruit or JVD.     Trachea: Trachea normal.  Cardiovascular:     Rate and Rhythm: Normal rate and regular rhythm.     Heart sounds: Normal heart sounds, S1 normal and S2 normal. No murmur heard.    No gallop.  Pulmonary:     Effort: Pulmonary effort is normal. No accessory muscle usage or respiratory distress.     Breath sounds: Normal breath sounds.  Abdominal:     General: Bowel sounds  are normal.     Palpations: Abdomen is soft. There is no hepatomegaly or splenomegaly.     Tenderness: There is no abdominal tenderness.  Musculoskeletal:        General: Normal range of motion.     Cervical back: Normal range of motion and neck supple.     Right lower leg: No edema.     Left lower leg: No edema.  Lymphadenopathy:     Head:     Right side of head: No submental, submandibular, tonsillar, preauricular or posterior auricular adenopathy.     Left side of head: No submental, submandibular, tonsillar, preauricular or posterior auricular adenopathy.     Cervical: No cervical adenopathy.  Skin:    General: Skin is warm and dry.     Capillary Refill: Capillary refill takes less than 2 seconds.     Findings: Bruising present. No rash.     Comments: Scattered purple bruises to upper extremities.  Neurological:     Mental Status: He is alert and oriented to person, place, and time.     Gait: Gait is intact.     Deep Tendon Reflexes: Reflexes are normal and symmetric.     Reflex Scores:      Brachioradialis reflexes are 2+ on the  right side and 2+ on the left side.      Patellar reflexes are 2+ on the right side and 2+ on the left side. Psychiatric:        Attention and Perception: Attention normal.        Mood and Affect: Mood normal.        Speech: Speech normal.        Behavior: Behavior normal. Behavior is cooperative.        Thought Content: Thought content normal.        Cognition and Memory: Cognition normal.        Judgment: Judgment normal.    Results for orders placed or performed in visit on 05/25/22  Comprehensive metabolic panel  Result Value Ref Range   Glucose 97 70 - 99 mg/dL   BUN 22 8 - 27 mg/dL   Creatinine, Ser 0.98 0.76 - 1.27 mg/dL   eGFR 82 >59 mL/min/1.73   BUN/Creatinine Ratio 22 10 - 24   Sodium 139 134 - 144 mmol/L   Potassium 3.7 3.5 - 5.2 mmol/L   Chloride 102 96 - 106 mmol/L   CO2 23 20 - 29 mmol/L   Calcium 9.2 8.6 - 10.2 mg/dL   Total Protein 6.6 6.0 - 8.5 g/dL   Albumin 4.0 3.8 - 4.8 g/dL   Globulin, Total 2.6 1.5 - 4.5 g/dL   Albumin/Globulin Ratio 1.5 1.2 - 2.2   Bilirubin Total 0.5 0.0 - 1.2 mg/dL   Alkaline Phosphatase 91 44 - 121 IU/L   AST 24 0 - 40 IU/L   ALT 15 0 - 44 IU/L  Lipid Panel w/o Chol/HDL Ratio  Result Value Ref Range   Cholesterol, Total 110 100 - 199 mg/dL   Triglycerides 71 0 - 149 mg/dL   HDL 42 >39 mg/dL   VLDL Cholesterol Cal 15 5 - 40 mg/dL   LDL Chol Calc (NIH) 53 0 - 99 mg/dL      Assessment & Plan:   Problem List Items Addressed This Visit       Cardiovascular and Mediastinum   Atrial fibrillation (HCC) - Primary    Chronic with rate controlled.  Diagnosed by cardiology. Dr. Humphrey Rolls, prior to  surgery 05/31/21.  No current BB or rate control medication.  Continue Xarelto as ordered. Will reach out to get Dr. Laurelyn Sickle recent notes and echo.      Relevant Medications   rosuvastatin (CRESTOR) 10 MG tablet   lisinopril-hydrochlorothiazide (ZESTORETIC) 20-25 MG tablet   amLODipine (NORVASC) 10 MG tablet   Other Relevant Orders    Comprehensive metabolic panel   Lipid Panel w/o Chol/HDL Ratio   Essential hypertension, benign    Chronic, stable with BP at goal for age. Recommend he monitor BP at least a few mornings a week at home and document.  DASH diet at home.  Continue current medication regimen and adjust as needed based on levels.  Labs today: CMP, CBC, urine ALB, TSH.  Urine ALB 10 (February 2024).  Refills sent.  Return in 6 months.       Relevant Medications   rosuvastatin (CRESTOR) 10 MG tablet   lisinopril-hydrochlorothiazide (ZESTORETIC) 20-25 MG tablet   amLODipine (NORVASC) 10 MG tablet   Other Relevant Orders   Microalbumin, Urine Waived   CBC with Differential/Platelet   Comprehensive metabolic panel   TSH   Senile purpura ([place])    Ongoing.  Noted on exam, discussed with patient.  Ensure gentle skin care and monitor for wounds, alert provider if they present.      Relevant Medications   rosuvastatin (CRESTOR) 10 MG tablet   lisinopril-hydrochlorothiazide (ZESTORETIC) 20-25 MG tablet   amLODipine (NORVASC) 10 MG tablet   Other Relevant Orders   CBC with Differential/Platelet     Respiratory   Sleep apnea    Chronic, stable. Continue with CPAP QHS, 100% use        Digestive   Barrett esophagus    Chronic, stable, noted on EGD in 2022.   Continue current medication regimen and adjust as needed, lifelong PPI.  Mag level annually. Risks of PPI use were discussed with patient including bone loss, C. Diff diarrhea, pneumonia, infections, CKD, electrolyte abnormalities.  Verbalizes understanding and chooses to continue the medication.       Relevant Orders   Magnesium     Hematopoietic and Hemostatic   Other thrombophilia ([place])    Due to A-Fib and Xarelto.  CBC annually and monitor for bleeding.      Relevant Orders   CBC with Differential/Platelet   Thrombocytopenia ([place])    Noted on labs October 2022, recent level February 2023 was stable at 185 -- recheck CBC today and monitor  closely.      Relevant Orders   CBC with Differential/Platelet     Other   History of prostate cancer    Stable.  PSA today. Continue to collaborate with Dr. Bernardo Heater as needed, patient wishes PSA result relayed to him.      Relevant Orders   PSA   Mixed hyperlipidemia    Chronic, ongoing.  Poor tolerance of daily Crestor at low dose.  Will continue 3 day a week dosing at this time.  Lipid panel today.  Educated patient on statin use and side effects.  Could consider Zetia if elevations noted.      Relevant Medications   rosuvastatin (CRESTOR) 10 MG tablet   lisinopril-hydrochlorothiazide (ZESTORETIC) 20-25 MG tablet   amLODipine (NORVASC) 10 MG tablet   Other Relevant Orders   Comprehensive metabolic panel   Lipid Panel w/o Chol/HDL Ratio   Morbid obesity (HCC)    BMI 39.74 with A-fib, HTN/HLD.  Recommended eating smaller high protein, low fat meals more frequently and  exercising 30 mins a day 5 times a week with a goal of 10-15lb weight loss in the next 3 months. Patient voiced their understanding and motivation to adhere to these recommendations.  Can not use GLP1 due to past pancreatitis issues.       Other Visit Diagnoses     Vitamin D deficiency       History of low levels reported, check today and start supplement as needed.   Relevant Orders   VITAMIN D 25 Hydroxy (Vit-D Deficiency, Fractures)   Flu vaccine need       Flu vaccine in office today, discussed with patient.   Relevant Orders   Flu Vaccine QUAD High Dose(Fluad) (Completed)   Colon cancer screening       Referral to GI placed.   Relevant Orders   Ambulatory referral to Gastroenterology   Encounter for annual physical exam       Annual physical today with labs and health maintenance reviewed, discussed with patient.        Discussed aspirin prophylaxis for myocardial infarction prevention and decision was made to continue ASA  LABORATORY TESTING:  Health maintenance labs ordered today as discussed  above. PSA on labs today and will alert urology to level.  IMMUNIZATIONS:   - Tdap: Tetanus vaccination status reviewed: Up To Date - Influenza: Up to date - Pneumovax: Up to Date - Prevnar: Up to date - Zostavax vaccine: Up To Date - Covid: Up To Date  SCREENING: - Colonoscopy: Up To Date -- next 04/03/2023 Discussed with patient purpose of the colonoscopy is to detect colon cancer at curable precancerous or early stages   - AAA Screening: Not applicable  -Hearing Test: Not applicable  -Spirometry: Not applicable   PATIENT COUNSELING:    Sexuality: Discussed sexually transmitted diseases, partner selection, use of condoms, avoidance of unintended pregnancy  and contraceptive alternatives.   Advised to avoid cigarette smoking.  I discussed with the patient that most people either abstain from alcohol or drink within safe limits (<=14/week and <=4 drinks/occasion for males, <=7/weeks and <= 3 drinks/occasion for females) and that the risk for alcohol disorders and other health effects rises proportionally with the number of drinks per week and how often a drinker exceeds daily limits.  Discussed cessation/primary prevention of drug use and availability of treatment for abuse.   Diet: Encouraged to adjust caloric intake to maintain  or achieve ideal body weight, to reduce intake of dietary saturated fat and total fat, to limit sodium intake by avoiding high sodium foods and not adding table salt, and to maintain adequate dietary potassium and calcium preferably from fresh fruits, vegetables, and low-fat dairy products.    Stressed the importance of regular exercise  Injury prevention: Discussed safety belts, safety helmets, smoke detector, smoking near bedding or upholstery.   Dental health: Discussed importance of regular tooth brushing, flossing, and dental visits.   Follow up plan: NEXT PREVENTATIVE PHYSICAL DUE IN 1 YEAR. Return in about 6 months (around 06/15/2023) for HTN/HLD,  GERD, H/O PROSTATE CA.

## 2022-12-16 LAB — COMPREHENSIVE METABOLIC PANEL
ALT: 18 IU/L (ref 0–44)
AST: 27 IU/L (ref 0–40)
Albumin/Globulin Ratio: 1.5 (ref 1.2–2.2)
Albumin: 4 g/dL (ref 3.8–4.8)
Alkaline Phosphatase: 85 IU/L (ref 44–121)
BUN/Creatinine Ratio: 17 (ref 10–24)
BUN: 18 mg/dL (ref 8–27)
Bilirubin Total: 0.6 mg/dL (ref 0.0–1.2)
CO2: 23 mmol/L (ref 20–29)
Calcium: 9.4 mg/dL (ref 8.6–10.2)
Chloride: 101 mmol/L (ref 96–106)
Creatinine, Ser: 1.04 mg/dL (ref 0.76–1.27)
Globulin, Total: 2.7 g/dL (ref 1.5–4.5)
Glucose: 77 mg/dL (ref 70–99)
Potassium: 4 mmol/L (ref 3.5–5.2)
Sodium: 141 mmol/L (ref 134–144)
Total Protein: 6.7 g/dL (ref 6.0–8.5)
eGFR: 76 mL/min/{1.73_m2} (ref >59)

## 2022-12-16 LAB — MAGNESIUM: Magnesium: 1.9 mg/dL (ref 1.6–2.3)

## 2022-12-16 LAB — TSH: TSH: 1.16 u[IU]/mL (ref 0.450–4.500)

## 2022-12-16 LAB — CBC WITH DIFFERENTIAL/PLATELET
Basophils Absolute: 0.1 10*3/uL (ref 0.0–0.2)
Basos: 1 %
EOS (ABSOLUTE): 0.4 10*3/uL (ref 0.0–0.4)
Eos: 6 %
Hematocrit: 40.5 % (ref 37.5–51.0)
Hemoglobin: 13.6 g/dL (ref 13.0–17.7)
Immature Grans (Abs): 0.1 10*3/uL (ref 0.0–0.1)
Immature Granulocytes: 1 %
Lymphocytes Absolute: 1.4 10*3/uL (ref 0.7–3.1)
Lymphs: 24 %
MCH: 30.6 pg (ref 26.6–33.0)
MCHC: 33.6 g/dL (ref 31.5–35.7)
MCV: 91 fL (ref 79–97)
Monocytes Absolute: 0.5 10*3/uL (ref 0.1–0.9)
Monocytes: 9 %
Neutrophils Absolute: 3.5 10*3/uL (ref 1.4–7.0)
Neutrophils: 59 %
Platelets: 183 10*3/uL (ref 150–450)
RBC: 4.44 x10E6/uL (ref 4.14–5.80)
RDW: 12.4 % (ref 11.6–15.4)
WBC: 5.9 10*3/uL (ref 3.4–10.8)

## 2022-12-16 LAB — LIPID PANEL W/O CHOL/HDL RATIO
Cholesterol, Total: 124 mg/dL (ref 100–199)
HDL: 50 mg/dL (ref >39)
LDL Chol Calc (NIH): 62 mg/dL (ref 0–99)
Triglycerides: 55 mg/dL (ref 0–149)
VLDL Cholesterol Cal: 12 mg/dL (ref 5–40)

## 2022-12-16 LAB — PSA: Prostate Specific Ag, Serum: <0.1 ng/mL (ref 0.0–4.0)

## 2022-12-16 LAB — VITAMIN D 25 HYDROXY (VIT D DEFICIENCY, FRACTURES): Vit D, 25-Hydroxy: 38.8 ng/mL (ref 30.0–100.0)

## 2022-12-16 NOTE — Progress Notes (Signed)
Contacted via [place] [facility] [place], your labs have returned and overall these look fantastic, including your cholesterol levels!!  Great job!!  I recommend continue all medications as ordered.  I will let urology know your PSA remains stable.  Any questions? Keep being stellar!!  Thank you for allowing me to participate in your care.  I appreciate you. Kindest regards, Nataliya Graig

## 2022-12-16 NOTE — Progress Notes (Signed)
Good morning, I promised Grant Diaz I would alert you his PSA remains stable at <0.1:)

## 2022-12-19 ENCOUNTER — Telehealth: Payer: Self-pay

## 2022-12-19 ENCOUNTER — Other Ambulatory Visit: Payer: Self-pay

## 2022-12-19 DIAGNOSIS — Z86010 Personal history of colonic polyps: Secondary | ICD-10-CM

## 2022-12-19 MED ORDER — NA SULFATE-K SULFATE-MG SULF 17.5-3.13-1.6 GM/177ML PO SOLN: 1.0000 | Freq: Once | ORAL | 0 refills | Status: AC

## 2022-12-19 NOTE — Telephone Encounter (Signed)
Gastroenterology Pre-Procedure Review  Request Date: 04/03/23 Requesting Physician: Dr. Allen Norris  PATIENT REVIEW QUESTIONS: The patient responded to the following health history questions as indicated:    1. Are you having any GI issues? 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 Do you have a personal history of Polyps? yes (last colonoscopy performed 04/02/2018 with Dr. Allen Norris polyps were present) 3. Do you have a family history of Colon Cancer or Polyps? no 4. Diabetes Mellitus? no 5. Joint replacements in the past 12 months?no 6. Major health problems in the past 3 months?no 7. Any artificial heart valves, MVP, or defibrillator?no    MEDICATIONS & ALLERGIES:    Patient reports the following regarding taking any anticoagulation/antiplatelet therapy:   Plavix, Coumadin, Eliquis, Xarelto, Lovenox, Pradaxa, Brilinta, or Effient? yes (Xarelto rx written by Dr. Neoma Laming) Aspirin? no  Patient confirms/reports the following medications:  Current Outpatient Medications  Medication Sig Dispense Refill   acetaminophen (TYLENOL) 325 MG tablet Take 650 mg by mouth every 6 (six) hours as needed for mild pain.     albuterol (VENTOLIN HFA) 108 (90 Base) MCG/ACT inhaler Inhale 2 puffs into the lungs every 6 (six) hours as needed for wheezing or shortness of breath. 18 g 0   amLODipine (NORVASC) 10 MG tablet Take 1 tablet (10 mg total) by mouth daily. 90 tablet 4   celecoxib (CELEBREX) 100 MG capsule TAKE 1 CAPSULE BY MOUTH ONCE DAILY AS NEEDED -  USE  MINIMALLY  SINCE  TAKING  XARELTO 90 capsule 0   Cholecalciferol 25 MCG (1000 UT) capsule Take 1,000 Units by mouth daily.     ketoconazole (NIZORAL) 2 % cream Apply to groin area qd 60 g 6   lisinopril-hydrochlorothiazide (ZESTORETIC) 20-25 MG tablet Take 1 tablet by mouth daily. 90 tablet 4   Multiple Vitamin (MULTIVITAMIN) capsule Take 1 capsule by mouth daily.     omeprazole (PRILOSEC) 20 MG capsule Take 1 capsule (20 mg total) by mouth daily. 90  capsule 4   rosuvastatin (CRESTOR) 10 MG tablet Take one tablet (10 MG) by mouth three times a week. 36 tablet 4   triamcinolone cream (KENALOG) 0.1 % Apply 1 Application topically as directed. Qd up to 5 days per week to aa eczema on body until clear, avoid face, groin, axilla 80 g 6   XARELTO 20 MG TABS tablet Take 20 mg by mouth daily.     No current facility-administered medications for this visit.    Patient confirms/reports the following allergies:  Allergies  Allergen Reactions   Adhesive [Tape] Rash    "whelps", latex adhesive tape only, paper tape is ok     No orders of the defined types were placed in this encounter.   AUTHORIZATION INFORMATION Primary Insurance: 1D#: Group #:  Secondary Insurance: 1D#: Group #:  SCHEDULE INFORMATION: Date: 04/03/23 Time: Location: [facility]

## 2023-01-01 ENCOUNTER — Telehealth: Payer: Self-pay | Admitting: Nurse Practitioner

## 2023-01-01 NOTE — Telephone Encounter (Signed)
Pt is calling to report that he received notification from [facility] that Jolene is out of network. Please advise CB- [phone]

## 2023-01-01 NOTE — Telephone Encounter (Signed)
LMOM for patient advising that Grant Diaz still accepts [facility] and that the letter was an error.

## 2023-02-07 ENCOUNTER — Encounter: Payer: Self-pay | Admitting: Dermatology

## 2023-02-07 ENCOUNTER — Ambulatory Visit: Payer: Medicare PPO | Admitting: Dermatology

## 2023-02-07 VITALS — BP 140/74 | HR 65

## 2023-02-07 DIAGNOSIS — B353 Tinea pedis: Secondary | ICD-10-CM | POA: Diagnosis not present

## 2023-02-07 DIAGNOSIS — L820 Inflamed seborrheic keratosis: Secondary | ICD-10-CM

## 2023-02-07 DIAGNOSIS — L578 Other skin changes due to chronic exposure to nonionizing radiation: Secondary | ICD-10-CM

## 2023-02-07 DIAGNOSIS — Z7189 Other specified counseling: Secondary | ICD-10-CM

## 2023-02-07 DIAGNOSIS — L304 Erythema intertrigo: Secondary | ICD-10-CM

## 2023-02-07 DIAGNOSIS — L2089 Other atopic dermatitis: Secondary | ICD-10-CM

## 2023-02-07 DIAGNOSIS — Z79899 Other long term (current) drug therapy: Secondary | ICD-10-CM

## 2023-02-07 DIAGNOSIS — Z1283 Encounter for screening for malignant neoplasm of skin: Secondary | ICD-10-CM | POA: Diagnosis not present

## 2023-02-07 DIAGNOSIS — L821 Other seborrheic keratosis: Secondary | ICD-10-CM

## 2023-02-07 DIAGNOSIS — L814 Other melanin hyperpigmentation: Secondary | ICD-10-CM

## 2023-02-07 DIAGNOSIS — L570 Actinic keratosis: Secondary | ICD-10-CM

## 2023-02-07 MED ORDER — KETOCONAZOLE 2 % EX CREA: TOPICAL_CREAM | CUTANEOUS | 11 refills | Status: DC

## 2023-02-07 NOTE — Progress Notes (Signed)
Follow-Up Visit   Subjective  Grant Diaz is a 72 y.o. 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 The patient has spots, moles and lesions to be evaluated, some may be new or changing and the patient has concerns that these could be cancer.  The following portions of the chart were reviewed this encounter and updated as appropriate: medications, allergies, medical history  Review of Systems:  No other skin or systemic complaints except as noted in HPI or Assessment and Plan.  Objective  Well appearing patient in no apparent distress; mood and affect are within normal limits.  A full examination was performed including scalp, head, eyes, ears, nose, lips, neck, chest, axillae, abdomen, back, buttocks, bilateral upper extremities, bilateral lower extremities, hands, feet, fingers, toes, fingernails, and toenails. All findings within normal limits unless otherwise noted below.   Relevant physical exam findings are noted in the Assessment and Plan.  scalp, face and ears x 20 (20) Erythematous thin papules/macules with gritty scale.   face x 4 (4) Erythematous stuck-on, waxy papule or plaque   Assessment & Plan   LENTIGINES, SEBORRHEIC KERATOSES, HEMANGIOMAS - Benign normal skin lesions - Benign-appearing - Call for any changes  Other atopic dermatitis legs Atopic dermatitis (eczema) is a chronic, relapsing, pruritic condition that can significantly affect quality of life. It is often associated with allergic rhinitis and/or asthma and can require treatment with topical medications, phototherapy, or in severe cases biologic injectable medication (Dupixent; Adbry) or Oral JAK inhibitors.  Chronic and persistent condition with duration or expected duration over  one year. Condition is symptomatic / bothersome to patient. Not to goal.   Continue Triamcinolone cream qd-bid prn  Start otc Cerave cream daily   Erythema intertrigo groin Intertrigo is a chronic recurrent rash that occurs in skin fold areas that may be associated with friction; heat; moisture; yeast; fungus; and bacteria.  It is exacerbated by increased movement / activity; sweating; and higher atmospheric temperature.  Chronic and persistent condition with duration or expected duration over one year. Condition is symptomatic / bothersome to patient. Not to goal.   Re-start Ketoconazole cream apply to affected skin qd  Purpura - Chronic; persistent and recurrent.  Treatable, but not curable. - Violaceous macules and patches - Benign - Related to trauma, age, sun damage and/or use of blood thinners, chronic use of topical and/or oral steroids - Observe - Can use OTC arnica containing moisturizer such as Dermend Bruise Formula if desired - Call for worsening or other concerns  TINEA PEDIS Exam: Scaling and maceration web spaces and over distal and lateral soles. Chronic and persistent condition with duration or expected duration over one year. Condition is symptomatic / bothersome to patient. Not to goal. Treatment Plan: Start ketoconazole cream - apply topically to entire foot (both feet) qhs   MELANOCYTIC NEVI - Tan-brown and/or pink-flesh-colored symmetric macules and papules - Benign appearing on exam today - Observation - Call clinic for new or changing moles - Recommend daily use of broad spectrum spf 30+ sunscreen to sun-exposed areas.   ACTINIC DAMAGE WITH PRECANCEROUS ACTINIC KERATOSES Counseling for Topical Chemotherapy Management: Patient exhibits: - Severe, confluent actinic changes with pre-cancerous actinic keratoses that is secondary to cumulative UV radiation exposure over time - Condition that  is severe; chronic, not at goal. - diffuse scaly erythematous macules  and papules with underlying dyspigmentation - Discussed Prescription "Field Treatment" topical Chemotherapy for Severe, Chronic Confluent Actinic Changes with Pre-Cancerous Actinic Keratoses Field treatment involves treatment of an entire area of skin that has confluent Actinic Changes (Sun/ Ultraviolet light damage) and PreCancerous Actinic Keratoses by method of PhotoDynamic Therapy (PDT) and/or prescription Topical Chemotherapy agents such as 5-fluorouracil, 5-fluorouracil/calcipotriene, and/or imiquimod.  The purpose is to decrease the number of clinically evident and subclinical PreCancerous lesions to prevent progression to development of skin cancer by chemically destroying early precancer changes that may or may not be visible.  It has been shown to reduce the risk of developing skin cancer in the treated area. As a result of treatment, redness, scaling, crusting, and open sores may occur during treatment course. One or more than one of these methods may be used and may have to be used several times to control, suppress and eliminate the PreCancerous changes. Discussed treatment course, expected reaction, and possible side effects. - Recommend daily broad spectrum sunscreen SPF 30+ to sun-exposed areas, reapply every 2 hours as needed.  - Staying in the shade or wearing long sleeves, sun glasses (UVA+UVB protection) and wide brim hats (4-inch brim around the entire circumference of the hat) are also recommended. - Call for new or changing lesions.  Start Red light treatment in September  Redlight treatment at scalp and forehead With debridement   SKIN CANCER SCREENING PERFORMED TODAY.  Actinic keratosis (20) scalp, face and ears x 20  Actinic keratoses are precancerous spots that appear secondary to cumulative UV radiation exposure/sun exposure over time. They are chronic with expected duration over 1 year. A portion of actinic keratoses will progress to squamous cell carcinoma of the skin.  It is not possible to reliably predict which spots will progress to skin cancer and so treatment is recommended to prevent development of skin cancer.  Recommend daily broad spectrum sunscreen SPF 30+ to sun-exposed areas, reapply every 2 hours as needed.  Recommend staying in the shade or wearing long sleeves, sun glasses (UVA+UVB protection) and wide brim hats (4-inch brim around the entire circumference of the hat). Call for new or changing lesions.  Destruction of lesion - scalp, face and ears x 20 Complexity: simple   Destruction method: cryotherapy   Informed consent: discussed and consent obtained   Timeout:  patient name, date of birth, surgical site, and procedure verified Lesion destroyed using liquid nitrogen: Yes   Region frozen until ice ball extended beyond lesion: Yes   Outcome: patient tolerated procedure well with no complications   Post-procedure details: wound care instructions given    Inflamed seborrheic keratosis face x 4 Symptomatic, irritating, patient would like treated.  Destruction of lesion - face x 4 Complexity: simple   Destruction method: cryotherapy   Informed consent: discussed and consent obtained   Timeout:  patient name, date of birth, surgical site, and procedure verified Lesion destroyed using liquid nitrogen: Yes   Region frozen until ice ball extended beyond lesion: Yes   Outcome: patient tolerated procedure well with no complications   Post-procedure details: wound care instructions given     Return for 1 year tbse .  IAsher Muir, CMA, am acting as scribe for Armida Sans, MD.  Documentation: I have reviewed the above documentation for accuracy and completeness, and I agree with the above.  Armida Sans, MD

## 2023-02-07 NOTE — Patient Instructions (Addendum)
Photodynamic Therapy/Red Light Therapy  Actinic keratoses are the dry, red scaly spots on the skin caused by sun damage. A portion of these spots can turn into skin cancer with time, and treating them can help prevent development of skin cancer.   Treatment of these spots requires removal of the defective skin cells. There are various ways to remove actinic keratoses, including freezing with liquid nitrogen, treatment with creams, or treatment with a blue light procedure in the office.   Photodynamic Therapy (PDT), also known as "Red light therapy" is an in office procedure used to treat actinic keratoses. It works by targeting precancerous cells. After treatment, these cells peel off and are replaced by healthy ones.   For your phototherapy appointment, you will have two appointments on the day of your treatment. The first appointment will be to apply a cream to the treatment area. You will leave this cream on for 1-2 hours depending on the area being treated. The second appointment will be to shine a blue light on the area for 16 minutes to kill off the precancer cells. It is common to experience a burning sensation during the treatment.  After your treatment, it will be important to keep the treated areas of skin out of the sun completely for 48-72 hours (2-3 days) to prevent having a reaction.   Common side effects include: - Burning or stinging, which may be severe and can last up to 24-72 hours after your treatment - Scaling and crusting which may last up to 2 weeks - Redness, swelling and/or peeling which can last up to 4 weeks  To Care for Your Skin After PDT/Red Light Therapy: - Wash with soap, water and shampoo as normal. - If needed, you can use cold compresses (e.g. ice packs) for comfort - If okay with your primary care doctor, you may use analgesics such as acetaminophen (tylenol) every 4-6 hours, not to exceed recommended dose - You may apply Cerave Healing Ointment,  Vaseline or Aquaphor as needed - If you have a lot of swelling you may take a Benadryl to help with this (this may cause drowsiness), not to exceed recommended dose. This may increase the risk of falls in people over 65 and may slow reaction time while driving, so it is not recommended to take before driving or operating machinery. - Sun Precautions - Wear a wide brim hat for the next week if outside  - Wear a sunblock with zinc or titanium dioxide at least SPF 50 daily  If you have any questions or concerns, please call the office and ask to speak with a nurse.   --------------------------------------------------------------------------------------------------------------  Actinic keratoses are precancerous spots that appear secondary to cumulative UV radiation exposure/sun exposure over time. They are chronic with expected duration over 1 year. A portion of actinic keratoses will progress to squamous cell carcinoma of the skin. It is not possible to reliably predict which spots will progress to skin cancer and so treatment is recommended to prevent development of skin cancer.  Recommend daily broad spectrum sunscreen SPF 30+ to sun-exposed areas, reapply every 2 hours as needed.  Recommend staying in the shade or wearing long sleeves, sun glasses (UVA+UVB protection) and wide brim hats (4-inch brim around the entire circumference of the hat). Call for new or changing lesions.   Cryotherapy Aftercare  Wash gently with soap and water everyday.   Apply Vaseline and Band-Aid daily until healed.     Seborrheic Keratosis  What causes  seborrheic keratoses? Seborrheic keratoses are harmless, common skin growths that first appear during adult life.  As time goes by, more growths appear.  Some people may develop a large number of them.  Seborrheic keratoses appear on both covered and uncovered body parts.  They are not caused by sunlight.  The tendency to develop seborrheic keratoses can be  inherited.  They vary in color from skin-colored to gray, brown, or even black.  They can be either smooth or have a rough, warty surface.   Seborrheic keratoses are superficial and look as if they were stuck on the skin.  Under the microscope this type of keratosis looks like layers upon layers of skin.  That is why at times the top layer may seem to fall off, but the rest of the growth remains and re-grows.    Treatment Seborrheic keratoses do not need to be treated, but can easily be removed in the office.  Seborrheic keratoses often cause symptoms when they rub on clothing or jewelry.  Lesions can be in the way of shaving.  If they become inflamed, they can cause itching, soreness, or burning.  Removal of a seborrheic keratosis can be accomplished by freezing, burning, or surgery. If any spot bleeds, scabs, or grows rapidly, please return to have it checked, as these can be an indication of a skin cancer.    Due to recent changes in healthcare laws, you may see results of your pathology and/or laboratory studies on MyChart before the doctors have had a chance to review them. We understand that in some cases there may be results that are confusing or concerning to you. Please understand that not all results are received at the same time and often the doctors may need to interpret multiple results in order to provide you with the best plan of care or course of treatment. Therefore, we ask that you please give [place] 2 business days to thoroughly review all your results before contacting the office for clarification. Should we see a critical lab result, you will be contacted sooner.   If You Need Anything After Your Visit  If you have any questions or concerns for your doctor, please call our main line at [phone] and press option 4 to reach your doctor's medical assistant. If no one answers, please leave a voicemail as directed and we will return your call as soon as possible. Messages left after 4  pm will be answered the following business day.   You may also send [place] a message via MyChart. We typically respond to MyChart messages within 1-2 business days.  For prescription refills, please ask your pharmacy to contact our office. Our fax number is [phone].  If you have an urgent issue when the clinic is closed that cannot wait until the next business day, you can page your doctor at the number below.    Please note that while we do our best to be available for urgent issues outside of office hours, we are not available 24/7.   If you have an urgent issue and are unable to reach [place], you may choose to seek medical care at your doctor's office, retail clinic, urgent care center, or emergency room.  If you have a medical emergency, please immediately call 911 or go to the emergency department.  Pager Numbers  - Dr. Gwen Pounds: [phone]  - Dr. Neale Burly: [phone]  - Dr. Roseanne Reno: [phone]  In the event of inclement weather, please call our main line at [phone] for  an update on the status of any delays or closures.  Dermatology Medication Tips: Please keep the boxes that topical medications come in in order to help keep track of the instructions about where and how to use these. Pharmacies typically print the medication instructions only on the boxes and not directly on the medication tubes.   If your medication is too expensive, please contact our office at [phone] option 4 or send [place] a message through MyChart.   We are unable to tell what your co-pay for medications will be in advance as this is different depending on your insurance coverage. However, we may be able to find a substitute medication at lower cost or fill out paperwork to get insurance to cover a needed medication.   If a prior authorization is required to get your medication covered by your insurance company, please allow [place] 1-2 business days to complete this process.  Drug prices often vary  depending on where the prescription is filled and some pharmacies may offer cheaper prices.  The website www.goodrx.com contains coupons for medications through different pharmacies. The prices here do not account for what the cost may be with help from insurance (it may be cheaper with your insurance), but the website can give you the price if you did not use any insurance.  - You can print the associated coupon and take it with your prescription to the pharmacy.  - You may also stop by our office during regular business hours and pick up a GoodRx coupon card.  - If you need your prescription sent electronically to a different pharmacy, notify our office through [facility] or by phone at [phone] option 4.     Si Usted Necesita Algo Despus de Su Visita  Tambin puede enviarnos un mensaje a travs de Clinical cytogeneticist. Por lo general respondemos a los mensajes de MyChart en el transcurso de 1 a 2 das hbiles.  Para renovar recetas, por favor pida a su farmacia que se ponga en contacto con nuestra oficina. Annie Sable de fax es [place] [phone].  Si tiene un asunto urgente cuando la clnica est cerrada y que no puede esperar hasta el siguiente da hbil, puede llamar/localizar a su doctor(a) al nmero que aparece a continuacin.   Por favor, tenga en cuenta que aunque hacemos todo lo posible para estar disponibles para asuntos urgentes fuera del horario de [place], no estamos disponibles las 24 horas del da, los 7 [address]  [address] de la [place].   Si tiene un problema urgente y no puede comunicarse con nosotros, puede optar por buscar atencin mdica  en el consultorio de su doctor(a), en una clnica privada, en un centro de atencin urgente o en una sala de emergencias.  Si tiene Engineer, drilling, por favor llame inmediatamente al 911 o vaya a la sala de emergencias.  Nmeros de bper  - Dr. Gwen Pounds: [phone]  - Dra. Moye: [phone]  - Dra. Roseanne Reno: [phone]  En caso de  inclemencias del [place], por favor llame a Lacy Duverney principal al [phone] para una actualizacin sobre el [place] de cualquier retraso o cierre.  Consejos para la medicacin en dermatologa: Por favor, guarde las cajas en las que vienen los medicamentos de uso tpico para ayudarle a seguir las instrucciones sobre dnde y cmo usarlos. Las farmacias generalmente imprimen las instrucciones del medicamento slo en las cajas y no directamente en los tubos del [place].   Si su medicamento es [employer], por favor, pngase en contacto con Ferne Coe oficina  llamando al [phone] y presione la opcin 4 o envenos un mensaje a travs de Clinical cytogeneticist.   No podemos decirle cul ser su copago por los medicamentos por adelantado ya que esto es diferente dependiendo de la cobertura de su seguro. Sin embargo, es posible que podamos encontrar un medicamento sustituto a Audiological scientist un formulario para que el seguro cubra el medicamento que se considera necesario.   Si se requiere una autorizacin previa para que su compaa de seguros [place] su medicamento, por favor permtanos de 1 a 2 das hbiles para completar [address]  Los precios de los medicamentos varan con frecuencia dependiendo del Environmental consultant de dnde se surte la receta y alguna farmacias pueden ofrecer precios ms baratos.  El sitio web www.goodrx.com tiene cupones para medicamentos de Health and safety inspector. Los precios aqu no tienen en cuenta lo que podra costar con la ayuda del seguro (puede ser ms barato con su seguro), pero el sitio web puede darle el precio si no utiliz Tourist information centre manager.  - Puede imprimir el cupn correspondiente y llevarlo con su receta a la farmacia.  - Tambin puede pasar por nuestra oficina durante el horario de atencin regular y Education officer, museum una tarjeta de cupones de GoodRx.  - Si necesita que su receta se enve electrnicamente a una farmacia diferente, informe a nuestra oficina a travs de MyChart de [place] o  por telfono llamando al [phone] y presione la opcin 4.

## 2023-02-08 ENCOUNTER — Other Ambulatory Visit: Payer: Self-pay | Admitting: Nurse Practitioner

## 2023-02-08 NOTE — Telephone Encounter (Signed)
Requested Prescriptions  Pending Prescriptions Disp Refills   celecoxib (CELEBREX) 100 MG capsule [Pharmacy Med Name: Celecoxib 100 MG Oral Capsule] 90 capsule 0    Sig: TAKE 1 CAPSULE BY MOUTH ONCE DAILY AS NEEDED -  USE  MINIMALLY  SINCE  TAKING  XARELTO     Analgesics:  COX2 Inhibitors Failed - 02/08/2023  9:25 AM      Failed - Manual Review: Labs are only required if the patient has taken medication for more than 8 weeks.      Passed - HGB in normal range and within 360 days    Hemoglobin  Date Value Ref Range Status  12/15/2022 13.6 13.0 - 17.7 g/dL Final         Passed - Cr in normal range and within 360 days    Creatinine, Ser  Date Value Ref Range Status  12/15/2022 1.04 0.76 - 1.27 mg/dL Final         Passed - HCT in normal range and within 360 days    Hematocrit  Date Value Ref Range Status  12/15/2022 40.5 37.5 - 51.0 % Final         Passed - AST in normal range and within 360 days    AST  Date Value Ref Range Status  12/15/2022 27 0 - 40 IU/L Final         Passed - ALT in normal range and within 360 days    ALT  Date Value Ref Range Status  12/15/2022 18 0 - 44 IU/L Final         Passed - eGFR is 30 or above and within 360 days    GFR calc Af Amer  Date Value Ref Range Status  11/24/2020 101 >59 mL/min/1.73 Final    Comment:    **In accordance with recommendations from the NKF-ASN Task force,**   [facility] is in the process of updating its eGFR calculation to the   2021 CKD-EPI creatinine equation that estimates kidney function   without a race variable.    GFR, Estimated  Date Value Ref Range Status  06/01/2021 >60 >60 mL/min Final    Comment:    (NOTE) Calculated using the CKD-EPI Creatinine Equation (2021)    eGFR  Date Value Ref Range Status  12/15/2022 76 >59 mL/min/1.73 Final         Passed - Patient is not pregnant      Passed - Valid encounter within last 12 months    Recent Outpatient Visits           1 month ago Paroxysmal atrial  fibrillation (HCC)   [place] [facility] [place], [place] T, NP   3 months ago Paroxysmal atrial fibrillation (HCC)   [place] [facility] [place], Corrie Dandy T, NP   8 months ago Paroxysmal atrial fibrillation (HCC)   [place] [facility] [place], Dorie Rank, NP   11 months ago Environmental and seasonal allergies   [place] [facility] [place], No Name T, NP   1 year ago Environmental and seasonal allergies   [place] [facility] [place], Corrie Dandy T, NP       Future Appointments             In 2 months Laurier Nancy, MD [facility]   In 4 months [place], Dorie Rank, NP  [facility]   In 1 year Deirdre Evener, MD [facility]

## 2023-02-16 DIAGNOSIS — G4733 Obstructive sleep apnea (adult) (pediatric): Secondary | ICD-10-CM | POA: Diagnosis not present

## 2023-02-19 ENCOUNTER — Encounter: Payer: Self-pay | Admitting: Dermatology

## 2023-03-27 ENCOUNTER — Telehealth: Payer: Self-pay

## 2023-03-27 NOTE — Telephone Encounter (Signed)
Pt left a vm regarding his cardiac/blood thinner clearance from Dr. Welton Flakes. His procedure is scheduled for 04/03/23. Please call pt asap to advise.

## 2023-03-27 NOTE — Telephone Encounter (Signed)
Procedure has been canceled at this time until after his office visit with Inetta Fermo.  Thanks, Marcelino Duster, cMA

## 2023-03-27 NOTE — Telephone Encounter (Signed)
Patient has been advised that we have not received blood thinner request or cardiac clearance in regards to his scheduled colonoscopy with Dr. Servando Snare on 04/03/23.    Patient has an office visit scheduled with Inetta Fermo on 04/09/23.  He said he will call Dr. Milta Deiters office to inquire about cardiac clearance and blood thinner advice.    Blood thinner request has been sent to Dr. Milta Deiters in basket.  Thanks, [place], [place]

## 2023-04-03 ENCOUNTER — Ambulatory Visit: Admit: 2023-04-03 | Payer: Medicare PPO | Admitting: Gastroenterology

## 2023-04-03 SURGERY — COLONOSCOPY WITH PROPOFOL
Anesthesia: General

## 2023-04-09 ENCOUNTER — Encounter: Payer: Self-pay | Admitting: Physician Assistant

## 2023-04-09 ENCOUNTER — Ambulatory Visit: Payer: Medicare PPO | Admitting: Physician Assistant

## 2023-04-09 VITALS — BP 147/53 | HR 62 | Temp 97.8°F | Ht 68.0 in | Wt 263.2 lb

## 2023-04-09 DIAGNOSIS — Z86010 Personal history of colonic polyps: Secondary | ICD-10-CM

## 2023-04-09 DIAGNOSIS — R1310 Dysphagia, unspecified: Secondary | ICD-10-CM | POA: Diagnosis not present

## 2023-04-09 DIAGNOSIS — K2270 Barrett's esophagus without dysplasia: Secondary | ICD-10-CM | POA: Diagnosis not present

## 2023-04-09 DIAGNOSIS — K219 Gastro-esophageal reflux disease without esophagitis: Secondary | ICD-10-CM | POA: Diagnosis not present

## 2023-04-09 MED ORDER — OMEPRAZOLE 20 MG PO CPDR
20.0000 mg | DELAYED_RELEASE_CAPSULE | Freq: Two times a day (BID) | ORAL | 3 refills | Status: DC
Start: 2023-04-09 — End: 2023-11-27

## 2023-04-09 MED ORDER — PEG 3350-KCL-NA BICARB-NACL 420 G PO SOLR: 4000.0000 mL | Freq: Once | ORAL | 0 refills | Status: AC

## 2023-04-09 NOTE — Progress Notes (Signed)
Grant Amy, PA-C [address]  [address]  [place], [place] [address]  Main: [phone]  Fax: [phone]   Primary Care Physician: Marjie Skiff, NP  Primary Gastroenterologist:  Grant Amy, PA-C / Dr. [name]   CC: History of Barrett's esophagus; Hx colon polyps; Dysphagia  HPI: [name] is a 72 y.o. male last saw Dr. Maximino Greenland 07/2021.  Patient is having solid food dysphagia for several months.  Solid foods such as salads, steak, and bread are getting stuck in his mid esophagus.  He feels like food is going down very slowly.  He eats dinner around 7 PM.  Lays down to go to bed at 8 PM.  Then he has regurgitation of undigested food after he lays down.  Currently taking Prilosec 20 mg once daily.  He denies any other GI symptoms.  History of laparoscopic band removal and hiatal hernia repair 05/2021.  Intermittent dysphagia.  Previous cholecystectomy.  Upper GI symptoms improved after surgery 05/2021.  Due for repeat EGD for Barrett's and repeat colonoscopy for history of polyps.  He takes omeprazole 20 mg once daily for acid reflux.  Last EGD 01/2021 showed short segment Barrett's, small hiatal hernia.  Biopsies negative for celiac, EOE, and H. pylori.  No dysplasia.    Last colonoscopy 03/2018 (to evaluate positive Cologuard) showed 2 small benign lymphoid polyps removed.  History of atrial fibrillation, currently on Xarelto.  History of DVT, PE (2016), sleep apnea uses CPAP,, hypertension, and prostate cancer.  Cardiologist Dr. Park Breed.  Echo showed normal LVEF 71%.    Current Outpatient Medications  Medication Sig Dispense Refill   acetaminophen (TYLENOL) 325 MG tablet Take 650 mg by mouth every 6 (six) hours as needed for mild pain.     albuterol (VENTOLIN HFA) 108 (90 Base) MCG/ACT inhaler Inhale 2 puffs into the lungs every 6 (six) hours as needed for wheezing or shortness of breath. (Patient not taking: Reported on 12/19/2022) 18 g 0   amLODipine (NORVASC)  10 MG tablet Take 1 tablet (10 mg total) by mouth daily. 90 tablet 4   celecoxib (CELEBREX) 100 MG capsule TAKE 1 CAPSULE BY MOUTH ONCE DAILY AS NEEDED -  USE  MINIMALLY  SINCE  TAKING  XARELTO 90 capsule 1   Cholecalciferol 25 MCG (1000 UT) capsule Take 1,000 Units by mouth daily.     ketoconazole (NIZORAL) 2 % cream Apply to groin area qd 60 g 6   ketoconazole (NIZORAL) 2 % cream Qhs to aa rash groin prn flares also use to both feet nightly for tinea pedis 60 g 11   lisinopril-hydrochlorothiazide (ZESTORETIC) 20-25 MG tablet Take 1 tablet by mouth daily. 90 tablet 4   Multiple Vitamin (MULTIVITAMIN) capsule Take 1 capsule by mouth daily.     omeprazole (PRILOSEC) 20 MG capsule Take 1 capsule (20 mg total) by mouth daily. 90 capsule 4   rosuvastatin (CRESTOR) 10 MG tablet Take one tablet (10 MG) by mouth three times a week. 36 tablet 4   triamcinolone cream (KENALOG) 0.1 % Apply 1 Application topically as directed. Qd up to 5 days per week to aa eczema on body until clear, avoid face, groin, axilla 80 g 6   XARELTO 20 MG TABS tablet Take 20 mg by mouth daily.     No current facility-administered medications for this visit.    Allergies as of 04/09/2023 - Review Complete 03/27/2023  Allergen Reaction Noted   Adhesive [tape] Rash 09/29/2015  Past Medical History:  Diagnosis Date   Arthritis    Atrial fibrillation (HCC)    Barrett's esophagus    Cancer (HCC) 2008   Prostate-radiation   Cataract 2019   not yet significant enough to warrant surgery   Dysrhythmia    Afib   GERD (gastroesophageal reflux disease)    History of DVT (deep vein thrombosis)    History of hiatal hernia    History of kidney stones    Hypertension    Joint pain    Mixed hyperlipidemia    Pancreatitis 2013   [facility]   Prostate cancer [facility])    Pulmonary embolus (HCC) 12/2014   Bilateral pulmonary embolus, [facility]   Sleep apnea 2010   use CPAP regularly   Sleep apnea, obstructive    cpap    Past Surgical  History:  Procedure Laterality Date   CARPAL TUNNEL RELEASE Right 03/15/2017   Procedure: CARPAL TUNNEL RELEASE;  Surgeon: Kennedy Bucker, MD;  Location: [facility] ORS;  Service: Orthopedics;  Laterality: Right;   CHOLECYSTECTOMY     COLONOSCOPY WITH PROPOFOL N/A 04/02/2018   Procedure: COLONOSCOPY WITH PROPOFOL;  Surgeon: Midge Minium, MD;  Location: [facility] ENDOSCOPY;  Service: Endoscopy;  Laterality: N/A;   ESOPHAGOGASTRODUODENOSCOPY N/A 05/28/2013   Procedure: ESOPHAGOGASTRODUODENOSCOPY (EGD);  Surgeon: Kandis Cocking, MD;  Location: [facility];  Service: General;  Laterality: N/A;   ESOPHAGOGASTRODUODENOSCOPY (EGD) WITH PROPOFOL N/A 02/08/2021   Procedure: ESOPHAGOGASTRODUODENOSCOPY (EGD) WITH PROPOFOL;  Surgeon: Pasty Spillers, MD;  Location: [facility] ENDOSCOPY;  Service: Endoscopy;  Laterality: N/A;   GALLBLADDER SURGERY  2003   GANGLION CYST EXCISION Right 03/15/2017   Procedure: REMOVAL GANGLION OF WRIST;  Surgeon: Kennedy Bucker, MD;  Location: [facility] ORS;  Service: Orthopedics;  Laterality: Right;   HERNIA REPAIR     HIATAL HERNIA REPAIR N/A 05/31/2021   Procedure: LAPAROSCOPIC REPAIR OF HIATAL HERNIA WITH LAP BAND REMOVAL;  Surgeon: Sheliah Hatch De Blanch, MD;  Location: [facility] ORS;  Service: General;  Laterality: N/A;   JOINT REPLACEMENT     KNEE ARTHROSCOPY Bilateral [id number]   LAPAROSCOPIC GASTRIC BANDING  2009   and hernia repair   LAPAROSCOPIC REPAIR OF HIATAL HERNIA WITH LAP BAND REMOVAL   05/31/2021   RADIATION IMPLANT W/ ULTRASOUND, MALE  2005   internal/external radiation for prostate cancer   TOTAL KNEE ARTHROPLASTY Left 10/12/2015   Procedure: TOTAL KNEE ARTHROPLASTY;  Surgeon: Kennedy Bucker, MD;  Location: [facility] ORS;  Service: Orthopedics;  Laterality: Left;   TOTAL KNEE ARTHROPLASTY Right 05/15/2017   Procedure: TOTAL KNEE ARTHROPLASTY;  Surgeon: Kennedy Bucker, MD;  Location: [facility] ORS;  Service: Orthopedics;  Laterality: Right;   UMBILICAL HERNIA REPAIR  2010    Review of  Systems:    All systems reviewed and negative except where noted in HPI.   Physical Examination:   There were no vitals taken for this visit.  General: Well-nourished, well-developed in no acute distress.  Eyes: No icterus. Conjunctivae pink. Mouth: Oropharyngeal mucosa moist and pink , no lesions erythema or exudate. Lungs: Clear to auscultation bilaterally. Non-labored. Heart: Regular rate and rhythm, no murmurs rubs or gallops.  Abdomen: Bowel sounds are normal; Abdomen is Soft; No hepatosplenomegaly, masses or hernias;  No Abdominal Tenderness; No guarding or rebound tenderness. Extremities: No lower extremity edema. No clubbing or deformities. Neuro: Alert and oriented x 3.  Grossly intact. Skin: Warm and dry, no jaundice.   Psych: Alert and cooperative, normal mood and affect.   Imaging Studies: No results found.  Assessment and Plan:   BARRON BINNER is a 72 y.o. y/o male returns for follow-up of Barrett's esophagus and history of colon polyp.  1.  Barrett's esophagus  Scheduling EGD I discussed risks of EGD with patient to include risk of bleeding, perforation, and risk of sedation.  Patient expressed understanding and agrees to proceed with EGD.   2.  Dysphagia - Most likely regurgitation due to GERD; rule out esophageal stricture or dysmotility.  Scheduling barium swallow with tablet.  2.  GERD  Increase Omeprazole to 20mg  1 tablet twice daily before breakfast and dinner. Recommend Lifestyle Modifications to prevent Acid Reflux.  Rec. Avoid coffee, sodas, peppermint, citrus fruits, and spicey foods.  Avoid eating 2-3 hours before bedtime.   2.  History of colon polyps  Scheduling Colonoscopy I discussed risks of colonoscopy with patient to include risk of bleeding, colon perforation, and risk of sedation.  Patient expressed understanding and agrees to proceed with colonoscopy.   3.  History of lap band removal and hiatal hernia repair in 2022.  4.  History of  A-fib, DVT, PE, currently on Xarelto.  Requesting permission to hold Xarelto prior to EGD/colonoscopy procedures.    Grant Amy, PA-C  Follow up in 3 months

## 2023-04-09 NOTE — Patient Instructions (Addendum)
Barium Swallow scheduled  04/11/2023 8:45 Nothing to eat/drink 3 hours prior. [facility] entrance.    Increase Omeprazole to 20mg  1 tablet twice daily before breakfast and dinner. Recommend Lifestyle Modifications to prevent Acid Reflux.  Rec. Avoid coffee, sodas, peppermint, citrus fruits, and spicey foods.  Avoid eating 2-3 hours before bedtime.

## 2023-04-11 ENCOUNTER — Other Ambulatory Visit: Payer: Self-pay | Admitting: Physician Assistant

## 2023-04-11 ENCOUNTER — Ambulatory Visit
Admission: RE | Admit: 2023-04-11 | Discharge: 2023-04-11 | Disposition: A | Discharge status: Home / Self Care | Payer: Medicare PPO | Source: Ambulatory Visit | Attending: Physician Assistant | Admitting: Physician Assistant

## 2023-04-11 DIAGNOSIS — R1310 Dysphagia, unspecified: Secondary | ICD-10-CM

## 2023-04-11 DIAGNOSIS — K219 Gastro-esophageal reflux disease without esophagitis: Secondary | ICD-10-CM

## 2023-04-11 DIAGNOSIS — K2270 Barrett's esophagus without dysplasia: Secondary | ICD-10-CM | POA: Diagnosis not present

## 2023-04-11 DIAGNOSIS — Z86010 Personal history of colonic polyps: Secondary | ICD-10-CM

## 2023-04-11 DIAGNOSIS — K224 Dyskinesia of esophagus: Secondary | ICD-10-CM | POA: Diagnosis not present

## 2023-04-11 DIAGNOSIS — K449 Diaphragmatic hernia without obstruction or gangrene: Secondary | ICD-10-CM | POA: Diagnosis not present

## 2023-04-11 NOTE — Progress Notes (Signed)
Notify patient barium swallow test shows: 1.  Small hiatal hernia 2.  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 3.  Esophageal dysmotility 4.  Spontaneous large-volume acid reflux into the upper esophagus. **I Recommend: Increase omeprazole to 40 mg 1 tablet twice daily.  Send Rx #60 with 3 refills.       Continue with plan for EGD with dilation as scheduled.

## 2023-04-12 ENCOUNTER — Telehealth: Payer: Self-pay

## 2023-04-12 MED ORDER — ESOMEPRAZOLE MAGNESIUM 40 MG PO CPDR: 40.0000 mg | DELAYED_RELEASE_CAPSULE | Freq: Every day | ORAL | 3 refills | Status: DC

## 2023-04-12 NOTE — Telephone Encounter (Signed)
Nexium sent to pharmacy-patient notified.  Notify patient barium swallow test shows:  1.  Small hiatal hernia  2.  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  3.  Esophageal dysmotility  4.  Spontaneous large-volume acid reflux into the upper esophagus.  **I Recommend: Increase omeprazole to 40 mg 1 tablet twice daily.  Send Rx #60 with 3 refills.        Continue with plan for EGD with dilation as scheduled.

## 2023-04-13 ENCOUNTER — Telehealth: Payer: Self-pay

## 2023-04-16 ENCOUNTER — Telehealth: Payer: Self-pay

## 2023-04-16 NOTE — Telephone Encounter (Signed)
error 

## 2023-04-16 NOTE — Telephone Encounter (Signed)
Spoke with patient to let him know when to stop Xarelto -3 days prior and to restart after 2 days.

## 2023-04-23 ENCOUNTER — Ambulatory Visit (INDEPENDENT_AMBULATORY_CARE_PROVIDER_SITE_OTHER): Payer: Medicare PPO

## 2023-04-23 VITALS — Ht 68.0 in | Wt 263.0 lb

## 2023-04-23 DIAGNOSIS — Z0000 Encounter for general adult medical examination without abnormal findings: Secondary | ICD-10-CM

## 2023-04-23 NOTE — Patient Instructions (Signed)
Grant Diaz , Thank you for taking time to come for your Medicare Wellness Visit. I appreciate your ongoing commitment to your health goals. Please review the following plan we discussed and let me know if I can assist you in the future.   These are the goals we discussed:  Goals      DIET - EAT MORE FRUITS AND VEGETABLES     Patient Stated     04/15/2021, possibly get bariatric surgery     Weight (lb) < 200 lb (90.7 kg)     Would like to loose weight        This is a list of the screening recommended for you and due dates:  Health Maintenance  Topic Date Due   COVID-19 Vaccine (3 - Moderna risk series) 02/11/2020   Colon Cancer Screening  04/03/2023   Flu Shot  05/24/2023   Medicare Annual Wellness Visit  04/22/2024   DTaP/Tdap/Td vaccine (2 - Tdap) 12/23/2028   Pneumonia Vaccine  Completed   Hepatitis C Screening  Completed   Zoster (Shingles) Vaccine  Completed   HPV Vaccine  Aged Out   Cologuard (Stool DNA test)  Discontinued    Advanced directives: yes  Conditions/risks identified: none  Next appointment: Follow up in one year for your annual wellness visit. 04/28/24 @ 2:30 pm by phone  Preventive Care 65 Years and Older, Male  Preventive care refers to lifestyle choices and visits with your health care provider that can promote health and wellness. What does preventive care include? A yearly physical exam. This is also called an annual well check. Dental exams once or twice a year. Routine eye exams. Ask your health care provider how often you should have your eyes checked. Personal lifestyle choices, including: Daily care of your teeth and gums. Regular physical activity. Eating a healthy diet. Avoiding tobacco and drug use. Limiting alcohol use. Practicing safe sex. Taking low doses of aspirin every day. Taking vitamin and mineral supplements as recommended by your health care provider. What happens during an annual well check? The services and screenings  done by your health care provider during your annual well check will depend on your age, overall health, lifestyle risk factors, and family history of disease. Counseling  Your health care provider may ask you questions about your: Alcohol use. Tobacco use. Drug use. Emotional well-being. Home and relationship well-being. Sexual activity. Eating habits. History of falls. Memory and ability to understand (cognition). Work and work Astronomer. Screening  You may have the following tests or measurements: Height, weight, and BMI. Blood pressure. Lipid and cholesterol levels. These may be checked every 5 years, or more frequently if you are over 75 years old. Skin check. Lung cancer screening. You may have this screening every year starting at age 14 if you have a 30-pack-year history of smoking and currently smoke or have quit within the past 15 years. Fecal occult blood test (FOBT) of the stool. You may have this test every year starting at age 10. Flexible sigmoidoscopy or colonoscopy. You may have a sigmoidoscopy every 5 years or a colonoscopy every 10 years starting at age 62. Prostate cancer screening. Recommendations will vary depending on your family history and other risks. Hepatitis C blood test. Hepatitis B blood test. Sexually transmitted disease (STD) testing. Diabetes screening. This is done by checking your blood sugar (glucose) after you have not eaten for a while (fasting). You may have this done every 1-3 years. Abdominal aortic aneurysm (AAA) screening. You may  need this if you are a current or former smoker. Osteoporosis. You may be screened starting at age 19 if you are at high risk. Talk with your health care provider about your test results, treatment options, and if necessary, the need for more tests. Vaccines  Your health care provider may recommend certain vaccines, such as: Influenza vaccine. This is recommended every year. Tetanus, diphtheria, and acellular  pertussis (Tdap, Td) vaccine. You may need a Td booster every 10 years. Zoster vaccine. You may need this after age 23. Pneumococcal 13-valent conjugate (PCV13) vaccine. One dose is recommended after age 43. Pneumococcal polysaccharide (PPSV23) vaccine. One dose is recommended after age 41. Talk to your health care provider about which screenings and vaccines you need and how often you need them. This information is not intended to replace advice given to you by your health care provider. Make sure you discuss any questions you have with your health care provider. Document Released: 11/05/2015 Document Revised: 06/28/2016 Document Reviewed: 08/10/2015 Elsevier Interactive Patient Education  2017 [place] Prevention in the Home Falls can cause injuries. They can happen to people of all ages. There are many things you can do to make your home safe and to help prevent falls. What can I do on the outside of my home? Regularly fix the edges of walkways and driveways and fix any cracks. Remove anything that might make you trip as you walk through a door, such as a raised step or threshold. Trim any bushes or trees on the path to your home. Use bright outdoor lighting. Clear any walking paths of anything that might make someone trip, such as rocks or tools. Regularly check to see if handrails are loose or broken. Make sure that both sides of any steps have handrails. Any raised decks and porches should have guardrails on the edges. Have any leaves, snow, or ice cleared regularly. Use sand or salt on walking paths during winter. Clean up any spills in your garage right away. This includes oil or grease spills. What can I do in the bathroom? Use night lights. Install grab bars by the toilet and in the tub and shower. Do not use towel bars as grab bars. Use non-skid mats or decals in the tub or shower. If you need to sit down in the shower, use a plastic, non-slip stool. Keep the floor  dry. Clean up any water that spills on the floor as soon as it happens. Remove soap buildup in the tub or shower regularly. Attach bath mats securely with double-sided non-slip rug tape. Do not have throw rugs and other things on the floor that can make you trip. What can I do in the bedroom? Use night lights. Make sure that you have a light by your bed that is easy to reach. Do not use any sheets or blankets that are too big for your bed. They should not hang down onto the floor. Have a firm chair that has side arms. You can use this for support while you get dressed. Do not have throw rugs and other things on the floor that can make you trip. What can I do in the kitchen? Clean up any spills right away. Avoid walking on wet floors. Keep items that you use a lot in easy-to-reach places. If you need to reach something above you, use a strong step stool that has a grab bar. Keep electrical cords out of the way. Do not use floor polish or wax that makes  floors slippery. If you must use wax, use non-skid floor wax. Do not have throw rugs and other things on the floor that can make you trip. What can I do with my stairs? Do not leave any items on the stairs. Make sure that there are handrails on both sides of the stairs and use them. Fix handrails that are broken or loose. Make sure that handrails are as long as the stairways. Check any carpeting to make sure that it is firmly attached to the stairs. Fix any carpet that is loose or worn. Avoid having throw rugs at the top or bottom of the stairs. If you do have throw rugs, attach them to the floor with carpet tape. Make sure that you have a light switch at the top of the stairs and the bottom of the stairs. If you do not have them, ask someone to add them for you. What else can I do to help prevent falls? Wear shoes that: Do not have high heels. Have rubber bottoms. Are comfortable and fit you well. Are closed at the toe. Do not wear  sandals. If you use a stepladder: Make sure that it is fully opened. Do not climb a closed stepladder. Make sure that both sides of the stepladder are locked into place. Ask someone to hold it for you, if possible. Clearly mark and make sure that you can see: Any grab bars or handrails. First and last steps. Where the edge of each step is. Use tools that help you move around (mobility aids) if they are needed. These include: Canes. Walkers. Scooters. Crutches. Turn on the lights when you go into a dark area. Replace any light bulbs as soon as they burn out. Set up your furniture so you have a clear path. Avoid moving your furniture around. If any of your floors are uneven, fix them. If there are any pets around you, be aware of where they are. Review your medicines with your doctor. Some medicines can make you feel dizzy. This can increase your chance of falling. Ask your doctor what other things that you can do to help prevent falls. This information is not intended to replace advice given to you by your health care provider. Make sure you discuss any questions you have with your health care provider. Document Released: 08/05/2009 Document Revised: 03/16/2016 Document Reviewed: 11/13/2014 Elsevier Interactive Patient Education  2017 [employer].

## 2023-04-23 NOTE — Progress Notes (Signed)
Subjective:   Grant Diaz is a 72 y.o. male who presents for Medicare Annual/Subsequent preventive examination.  Visit Complete: Virtual  I connected with  [name] on 04/23/23 by a audio enabled telemedicine application and verified that I am speaking with the correct person using two identifiers.  Patient Location: Home  Provider Location: Office/Clinic  I discussed the limitations of evaluation and management by telemedicine. The patient expressed understanding and agreed to proceed.  Review of Systems     Cardiac Risk Factors include: advanced age (>60men, >61 women);obesity (BMI >30kg/m2);male gender;hypertension;dyslipidemia     Objective:    Today's Vitals   04/23/23 1547 04/23/23 1609  Weight:  263 lb (119.3 kg)  Height:  5\' 8"  (1.727 m)  PainSc: 6     Body mass index is 39.99 kg/m.     04/23/2023    3:58 PM 04/18/2022    1:13 PM 05/31/2021   12:18 PM 05/26/2021    1:26 PM 04/15/2021    8:18 AM 02/08/2021    9:25 AM 03/24/2020    8:28 AM  Advanced Directives  Does Patient Have a Medical Advance Directive? Yes No No Yes Yes Yes Yes  Type of Estate agent of [place];Living will   Living will;Healthcare Power of [employer] Power of [place];Living will  Living will;Healthcare Power of Attorney  Does patient want to make changes to medical advance directive? No - Patient declined  No - Patient declined No - Patient declined     Copy of Healthcare Power of Attorney in Chart? Yes - validated most recent copy scanned in chart (See row information)   No - copy requested No - copy requested  Yes - validated most recent copy scanned in chart (See row information)  Would patient like information on creating a medical advance directive?  No - Patient declined No - Patient declined        Current Medications (verified) Outpatient Encounter Medications as of 04/23/2023  Medication Sig   acetaminophen (TYLENOL) 325 MG tablet Take 650 mg by mouth  every 6 (six) hours as needed for mild pain.   amLODipine (NORVASC) 10 MG tablet Take 1 tablet (10 mg total) by mouth daily.   celecoxib (CELEBREX) 100 MG capsule TAKE 1 CAPSULE BY MOUTH ONCE DAILY AS NEEDED -  USE  MINIMALLY  SINCE  TAKING  XARELTO   Cholecalciferol 25 MCG (1000 UT) capsule Take 1,000 Units by mouth daily.   esomeprazole (NEXIUM) 40 MG capsule Take 1 capsule (40 mg total) by mouth daily at 12 noon.   ketoconazole (NIZORAL) 2 % cream Qhs to aa rash groin prn flares also use to both feet nightly for tinea pedis   lisinopril-hydrochlorothiazide (ZESTORETIC) 20-25 MG tablet Take 1 tablet by mouth daily.   Multiple Vitamin (MULTIVITAMIN) capsule Take 1 capsule by mouth daily.   rosuvastatin (CRESTOR) 10 MG tablet Take one tablet (10 MG) by mouth three times a week.   triamcinolone cream (KENALOG) 0.1 % Apply 1 Application topically as directed. Qd up to 5 days per week to aa eczema on body until clear, avoid face, groin, axilla   XARELTO 20 MG TABS tablet Take 20 mg by mouth daily.   albuterol (VENTOLIN HFA) 108 (90 Base) MCG/ACT inhaler Inhale 2 puffs into the lungs every 6 (six) hours as needed for wheezing or shortness of breath. (Patient not taking: Reported on 04/23/2023)   ketoconazole (NIZORAL) 2 % cream Apply to groin area qd   omeprazole (  PRILOSEC) 20 MG capsule Take 1 capsule (20 mg total) by mouth 2 (two) times daily before a meal. (Patient not taking: Reported on 04/23/2023)   No facility-administered encounter medications on file as of 04/23/2023.    Allergies (verified) Adhesive [tape]   History: Past Medical History:  Diagnosis Date   Arthritis    Atrial fibrillation (HCC)    Barrett's esophagus    Cancer (HCC) 2008   Prostate-radiation   Cataract 2019   not yet significant enough to warrant surgery   Dysrhythmia    Afib   GERD (gastroesophageal reflux disease)    History of DVT (deep vein thrombosis)    History of hiatal hernia    History of kidney stones     Hypertension    Joint pain    Mixed hyperlipidemia    Pancreatitis 2013   [facility]   Prostate cancer [facility])    Pulmonary embolus (HCC) 12/2014   Bilateral pulmonary embolus, [facility]   Sleep apnea 2010   use CPAP regularly   Sleep apnea, obstructive    cpap   Past Surgical History:  Procedure Laterality Date   CARPAL TUNNEL RELEASE Right 03/15/2017   Procedure: CARPAL TUNNEL RELEASE;  Surgeon: Kennedy Bucker, MD;  Location: [facility] ORS;  Service: Orthopedics;  Laterality: Right;   CHOLECYSTECTOMY     COLONOSCOPY WITH PROPOFOL N/A 04/02/2018   Procedure: COLONOSCOPY WITH PROPOFOL;  Surgeon: Midge Minium, MD;  Location: [facility] ENDOSCOPY;  Service: Endoscopy;  Laterality: N/A;   ESOPHAGOGASTRODUODENOSCOPY N/A 05/28/2013   Procedure: ESOPHAGOGASTRODUODENOSCOPY (EGD);  Surgeon: Kandis Cocking, MD;  Location: [facility] ENDOSCOPY;  Service: General;  Laterality: N/A;   ESOPHAGOGASTRODUODENOSCOPY (EGD) WITH PROPOFOL N/A 02/08/2021   Procedure: ESOPHAGOGASTRODUODENOSCOPY (EGD) WITH PROPOFOL;  Surgeon: Pasty Spillers, MD;  Location: [facility] ENDOSCOPY;  Service: Endoscopy;  Laterality: N/A;   GALLBLADDER SURGERY  2003   GANGLION CYST EXCISION Right 03/15/2017   Procedure: REMOVAL GANGLION OF WRIST;  Surgeon: Kennedy Bucker, MD;  Location: [facility] ORS;  Service: Orthopedics;  Laterality: Right;   HERNIA REPAIR     HIATAL HERNIA REPAIR N/A 05/31/2021   Procedure: LAPAROSCOPIC REPAIR OF HIATAL HERNIA WITH LAP BAND REMOVAL;  Surgeon: Sheliah Hatch De Blanch, MD;  Location: [facility] ORS;  Service: General;  Laterality: N/A;   JOINT REPLACEMENT     KNEE ARTHROSCOPY Bilateral [id number]   LAPAROSCOPIC GASTRIC BANDING  2009   and hernia repair   LAPAROSCOPIC REPAIR OF HIATAL HERNIA WITH LAP BAND REMOVAL   05/31/2021   RADIATION IMPLANT W/ ULTRASOUND, MALE  2005   internal/external radiation for prostate cancer   TOTAL KNEE ARTHROPLASTY Left 10/12/2015   Procedure: TOTAL KNEE ARTHROPLASTY;  Surgeon: Kennedy Bucker, MD;   Location: [facility] ORS;  Service: Orthopedics;  Laterality: Left;   TOTAL KNEE ARTHROPLASTY Right 05/15/2017   Procedure: TOTAL KNEE ARTHROPLASTY;  Surgeon: Kennedy Bucker, MD;  Location: [facility] ORS;  Service: Orthopedics;  Laterality: Right;   UMBILICAL HERNIA REPAIR  2010   Family History  Problem Relation Age of Onset   Diabetes Mother    Arthritis Mother    Obesity Mother    Parkinson's disease Father    Hypertension Brother    Arthritis Brother    Asthma Brother    Diabetes Son        type 1   Heart disease Maternal Grandmother        CHF   Stroke Maternal Grandfather    Heart disease Paternal Grandmother        CHF  Stroke Paternal Grandfather    Social History   Socioeconomic History   Marital status: Married    Spouse name: Not on file   Number of children: Not on file   Years of education: Not on file   Highest education level: Not on file  Occupational History   Occupation: pastor   Tobacco Use   Smoking status: Never   Smokeless tobacco: Never  Vaping Use   Vaping Use: Never used  Substance and Sexual Activity   Alcohol use: Not Currently    Comment: quit using alcohol upon entering ministry 1977   Drug use: Never   Sexual activity: Not Currently    Birth control/protection: None  Other Topics Concern   Not on file  Social History Narrative   Not on file   Social Determinants of Health   Financial Resource Strain: Low Risk  (04/23/2023)   Overall Financial Resource Strain (CARDIA)    Difficulty of Paying Living Expenses: Not hard at all  Food Insecurity: No Food Insecurity (04/23/2023)   Hunger Vital Sign    Worried About Running Out of Food in the Last Year: Never true    Ran Out of Food in the Last Year: Never true  Transportation Needs: No Transportation Needs (04/23/2023)   PRAPARE - Administrator, Civil Service (Medical): No    Lack of Transportation (Non-Medical): No  Physical Activity: Sufficiently Active (04/23/2023)   Exercise Vital  Sign    Days of Exercise per Week: 3 days    Minutes of Exercise per Session: 60 min  Stress: No Stress Concern Present (04/23/2023)   [employer] of Occupational Health - Occupational Stress Questionnaire    Feeling of Stress : Not at all  Social Connections: Moderately Integrated (04/23/2023)   Social Connection and Isolation Panel [NHANES]    Frequency of Communication with Friends and Family: Three times a week    Frequency of Social Gatherings with Friends and Family: Three times a week    Attends Religious Services: More than 4 times per year    Active Member of Clubs or Organizations: No    Attends Banker Meetings: Never    Marital Status: Married    Tobacco Counseling Counseling given: Not Answered   Clinical Intake:  Pre-visit preparation completed: Yes  Pain : 0-10 Pain Score: 6  Pain Type: Chronic pain Pain Location: Ankle Pain Orientation: Left     Diabetes: No  How often do you need to have someone help you when you read instructions, pamphlets, or other written materials from your doctor or pharmacy?: 1 - Never  Interpreter Needed?: No  Information entered by :: Kennedy Bucker, LPN   Activities of Daily Living    04/23/2023    4:00 PM 04/23/2023    9:47 AM  In your present state of health, do you have any difficulty performing the following activities:  Hearing? 0 0  Vision? 0 0  Difficulty concentrating or making decisions? 0 0  Walking or climbing stairs? 0 0  Dressing or bathing? 0 0  Doing errands, shopping? 0 0  Preparing Food and eating ? N N  Using the Toilet? N N  In the past six months, have you accidently leaked urine? N Y  Do you have problems with loss of bowel control? N Y  Managing your Medications? N N  Managing your Finances? N N  Housekeeping or managing your Housekeeping? N N    Patient Care Team: Marjie Skiff,  NP as PCP - General (Nurse Practitioner) Riki Altes, MD (Urology) Gwyneth Revels, DPM as  Referring Physician (Podiatry)  Indicate any recent Medical Services you may have received from other than [facility] providers in the past year (date may be approximate).     Assessment:   This is a routine wellness examination for [place].  Hearing/Vision screen Hearing Screening - Comments:: No aids  Vision Screening - Comments:: Lasik surgery, has readersLewayne Bunting Dr.Daly  Dietary issues and exercise activities discussed:     Goals Addressed             This Visit's Progress    DIET - EAT MORE FRUITS AND VEGETABLES         Depression Screen    04/23/2023    3:56 PM 12/15/2022   10:45 AM 11/07/2022    3:22 PM 05/25/2022   10:47 AM 04/18/2022    1:07 PM 02/15/2022   10:46 AM 11/25/2021    8:24 AM  PHQ 2/9 Scores  PHQ - 2 Score 0 0 0 0 0 0 0  PHQ- 9 Score 0 1 0 1  1 2     Fall Risk    04/23/2023    3:59 PM 04/23/2023    9:47 AM 12/15/2022   10:44 AM 11/07/2022    3:22 PM 05/25/2022   10:47 AM  Fall Risk   Falls in the past year? 0 0 0 1 0  Number falls in past yr: 0 0 0  0  Injury with Fall? 0 0 0  0  Risk for fall due to : No Fall Risks  No Fall Risks History of fall(s) No Fall Risks  Follow up Falls prevention discussed;Falls evaluation completed  Falls evaluation completed Falls evaluation completed Falls evaluation completed    MEDICARE RISK AT HOME:  Medicare Risk at Home - 04/23/23 1600     Any stairs in or around the home? Yes    If so, are there any without handrails? No    Home free of loose throw rugs in walkways, pet beds, electrical cords, etc? Yes    Adequate lighting in your home to reduce risk of falls? Yes    Life alert? No    Use of a cane, walker or w/c? Yes   cane   Grab bars in the bathroom? Yes    Shower chair or bench in shower? Yes    Elevated toilet seat or a handicapped toilet? Yes             TIMED UP AND GO:  Was the test performed?  No    Cognitive Function:        04/23/2023    4:01 PM 04/18/2022    1:03 PM 04/15/2021    8:20  AM  6CIT Screen  What Year? 0 points 0 points 0 points  What month? 0 points 0 points 0 points  What time? 0 points 0 points 0 points  Count back from 20 0 points 0 points 0 points  Months in reverse 0 points 0 points 0 points  Repeat phrase 0 points 0 points 0 points  Total Score 0 points 0 points 0 points    Immunizations Immunization History  Administered Date(s) Administered   Fluad Quad(high Dose 65+) 06/29/2020, 08/01/2021, 12/15/2022   H1N1 01/13/2009   Influenza, High Dose Seasonal PF 11/28/2018, 07/08/2019   Influenza-Unspecified 07/23/2017, 07/08/2019   Moderna Sars-Covid-2 Vaccination 12/17/2019, 01/14/2020   Pneumococcal Conjugate-13 12/24/2018   Pneumococcal Polysaccharide-23 06/29/2020  Td 12/24/2018   Zoster Recombinant(Shingrix) 07/08/2019, 11/27/2019   Zoster, Live 01/24/2013    TDAP status: Up to date  Flu Vaccine status: Up to date  Pneumococcal vaccine status: Up to date  Covid-19 vaccine status: Completed vaccines  Qualifies for Shingles Vaccine? Yes   Zostavax completed Yes   Shingrix Completed?: Yes  Screening Tests Health Maintenance  Topic Date Due   COVID-19 Vaccine (3 - Moderna risk series) 02/11/2020   Colonoscopy  04/03/2023   INFLUENZA VACCINE  05/24/2023   Medicare Annual Wellness (AWV)  04/22/2024   DTaP/Tdap/Td (2 - Tdap) 12/23/2028   Pneumonia Vaccine 44+ Years old  Completed   Hepatitis C Screening  Completed   Zoster Vaccines- Shingrix  Completed   HPV VACCINES  Aged Out   Fecal DNA (Cologuard)  Discontinued    Health Maintenance  Health Maintenance Due  Topic Date Due   COVID-19 Vaccine (3 - Moderna risk series) 02/11/2020   Colonoscopy  04/03/2023    Colorectal cancer screening: Type of screening: Colonoscopy. Completed 04/02/18. Repeat every 5 years- scheduling for EGD and colonoscopy by GI specialist 06/04/23  Lung Cancer Screening: (Low Dose CT Chest recommended if Age 43-80 years, 20 pack-year currently smoking OR  have quit w/in 15years.) does not qualify.   Additional Screening:  Hepatitis C Screening: does qualify; Completed 12/24/18  Vision Screening: Recommended annual ophthalmology exams for early detection of glaucoma and other disorders of the eye. Is the patient up to date with their annual eye exam?  Yes  Who is the provider or what is the name of the office in which the patient attends annual eye exams? Dr.Daly If pt is not established with a provider, would they like to be referred to a provider to establish care? No .   Dental Screening: Recommended annual dental exams for proper oral hygiene   Community Resource Referral / Chronic Care Management: CRR required this visit?  No   CCM required this visit?  No     Plan:     I have personally reviewed and noted the following in the patient's chart:   Medical and social history Use of alcohol, tobacco or illicit drugs  Current medications and supplements including opioid prescriptions. Patient is not currently taking opioid prescriptions. Functional ability and status Nutritional status Physical activity Advanced directives List of other physicians Hospitalizations, surgeries, and ER visits in previous 12 months Vitals Screenings to include cognitive, depression, and falls Referrals and appointments  In addition, I have reviewed and discussed with patient certain preventive protocols, quality metrics, and best practice recommendations. A written personalized care plan for preventive services as well as general preventive health recommendations were provided to patient.     Hal Hope, LPN   0/12/4740   After Visit Summary: (MyChart) Due to this being a telephonic visit, the after visit summary with patients personalized plan was offered to patient via MyChart   Nurse Notes: none

## 2023-05-08 ENCOUNTER — Encounter: Payer: Self-pay | Admitting: Cardiovascular Disease

## 2023-05-08 ENCOUNTER — Ambulatory Visit: Payer: Medicare PPO | Admitting: Cardiovascular Disease

## 2023-05-08 VITALS — BP 131/79 | HR 86 | Ht 68.0 in | Wt 259.6 lb

## 2023-05-08 DIAGNOSIS — I480 Paroxysmal atrial fibrillation: Secondary | ICD-10-CM | POA: Diagnosis not present

## 2023-05-08 DIAGNOSIS — I340 Nonrheumatic mitral (valve) insufficiency: Secondary | ICD-10-CM

## 2023-05-08 DIAGNOSIS — I10 Essential (primary) hypertension: Secondary | ICD-10-CM

## 2023-05-08 DIAGNOSIS — G4733 Obstructive sleep apnea (adult) (pediatric): Secondary | ICD-10-CM | POA: Diagnosis not present

## 2023-05-08 DIAGNOSIS — E782 Mixed hyperlipidemia: Secondary | ICD-10-CM | POA: Diagnosis not present

## 2023-05-08 MED ORDER — XARELTO 20 MG PO TABS
20.0000 mg | ORAL_TABLET | Freq: Every day | ORAL | 3 refills | Status: DC
Start: 2023-05-08 — End: 2023-09-25

## 2023-05-08 NOTE — Progress Notes (Signed)
Cardiology Office Note   Date:  05/08/2023   ID:  Grant Diaz, DOB December 14, 1950, MRN [id number]  PCP:  Marjie Skiff, NP  Cardiologist:  Adrian Blackwater, MD      History of Present Illness: Grant Diaz is a 72 y.o. male who presents for  Chief Complaint  Patient presents with   Follow-up    41mo    Doing well      Past Medical History:  Diagnosis Date   Arthritis    Atrial fibrillation (HCC)    Barrett's esophagus    Cancer (HCC) 2008   Prostate-radiation   Cataract 2019   not yet significant enough to warrant surgery   Dysrhythmia    Afib   GERD (gastroesophageal reflux disease)    History of DVT (deep vein thrombosis)    History of hiatal hernia    History of kidney stones    Hypertension    Joint pain    Mixed hyperlipidemia    Pancreatitis 2013   [facility]   Prostate cancer [facility])    Pulmonary embolus (HCC) 12/2014   Bilateral pulmonary embolus, [facility]   Sleep apnea 2010   use CPAP regularly   Sleep apnea, obstructive    cpap     Past Surgical History:  Procedure Laterality Date   CARPAL TUNNEL RELEASE Right 03/15/2017   Procedure: CARPAL TUNNEL RELEASE;  Surgeon: Kennedy Bucker, MD;  Location: [facility] ORS;  Service: Orthopedics;  Laterality: Right;   CHOLECYSTECTOMY     COLONOSCOPY WITH PROPOFOL N/A 04/02/2018   Procedure: COLONOSCOPY WITH PROPOFOL;  Surgeon: Midge Minium, MD;  Location: [facility] ENDOSCOPY;  Service: Endoscopy;  Laterality: N/A;   ESOPHAGOGASTRODUODENOSCOPY N/A 05/28/2013   Procedure: ESOPHAGOGASTRODUODENOSCOPY (EGD);  Surgeon: Kandis Cocking, MD;  Location: [facility] ENDOSCOPY;  Service: General;  Laterality: N/A;   ESOPHAGOGASTRODUODENOSCOPY (EGD) WITH PROPOFOL N/A 02/08/2021   Procedure: ESOPHAGOGASTRODUODENOSCOPY (EGD) WITH PROPOFOL;  Surgeon: Pasty Spillers, MD;  Location: [facility] ENDOSCOPY;  Service: Endoscopy;  Laterality: N/A;   GALLBLADDER SURGERY  2003   GANGLION CYST EXCISION Right 03/15/2017   Procedure: REMOVAL GANGLION OF  WRIST;  Surgeon: Kennedy Bucker, MD;  Location: [facility] ORS;  Service: Orthopedics;  Laterality: Right;   HERNIA REPAIR     HIATAL HERNIA REPAIR N/A 05/31/2021   Procedure: LAPAROSCOPIC REPAIR OF HIATAL HERNIA WITH LAP BAND REMOVAL;  Surgeon: Sheliah Hatch De Blanch, MD;  Location: [facility] ORS;  Service: General;  Laterality: N/A;   JOINT REPLACEMENT     KNEE ARTHROSCOPY Bilateral [id number]   LAPAROSCOPIC GASTRIC BANDING  2009   and hernia repair   LAPAROSCOPIC REPAIR OF HIATAL HERNIA WITH LAP BAND REMOVAL   05/31/2021   RADIATION IMPLANT W/ ULTRASOUND, MALE  2005   internal/external radiation for prostate cancer   TOTAL KNEE ARTHROPLASTY Left 10/12/2015   Procedure: TOTAL KNEE ARTHROPLASTY;  Surgeon: Kennedy Bucker, MD;  Location: [facility] ORS;  Service: Orthopedics;  Laterality: Left;   TOTAL KNEE ARTHROPLASTY Right 05/15/2017   Procedure: TOTAL KNEE ARTHROPLASTY;  Surgeon: Kennedy Bucker, MD;  Location: [facility] ORS;  Service: Orthopedics;  Laterality: Right;   UMBILICAL HERNIA REPAIR  2010    ACTIVE PROBLEMS & CONDITIONS    Atrial Fibrillation    History of Esophageal Reflux    Hyperlipidemia    Hypertension Systemic    Organic Sleep Apnea     CHIEF COMPLAINT  The Chief Complaint is: 3 months follow up.     HISTORY OF PRESENT ILLNESS  Grant Diaz is a  72 year old male.   Patient in office for routine cardiac exam and to discuss echo results. Denies chest pain, shortness of breath.  Allergy list reviewed   Problem list reviewed   Medication list reviewed    No chest pain or discomfort   No palpitations  , and The heart rate was not fast    Not feeling congested in the chest   No dyspnea   No cough   No wheezing     CURRENT MEDICATION    Acetaminophen 325 MG Oral Tablet  prn every 6 hrs, 0 days, 0 refills    amLODIPine Besylate 5 MG Oral Tablet Take 1 tablet by mouth once daily, 90 days, 0 refills    Celecoxib 200 MG Oral Capsule  qd, 90 days, 0 refills    Ketoconazole 2% External Cream   qd, 15 days, 0 refills    Lisinopril-hydroCHLOROthiazide 20-25 MG Oral Tablet  qd, 90 days, 0 refills    Multivitamin Oral Tablet  qd, 0 days, 0 refills    Omeprazole 20 MG Oral Capsule Delayed Release  qd, 90 days, 0 refills    Rosuvastatin Calcium 10 MG Oral Tablet  three times a week, 84 days, 0 refills    Triamcinolone Acetonide 0.025% External Cream  bid prn, 15 days, 0 refills    Vitamin D (Cholecalciferol) 25 MCG (1000 UT) Oral Capsule  qd, 0 days, 0 refills    Xarelto 20 MG Oral Tablet Take 1 tablet by mouth once daily, 90 days, 1 refills     PAST MEDICAL/SURGICAL HISTORY  Diagnoses:  Atrial fibrillation Aortic regurgitation Mitral regurgitation Essential hypertension Pulmonary embolism March 2018.   Nonorganic sleep apnea.   Esophageal reflux.   Hyperlipidemia  Obesity     SOCIAL HISTORY  Tobacco use:  Not a current smoker and smoking status: Never smoker. Alcohol: Not using alcohol. Habits: Poor exercise habits.     ALLERGIES    Adhesive Tape    Latex Gloves     FAMILY HISTORY  Systemic hypertension     REVIEW OF SYSTEMS  Systemic: Not feeling poorly (malaise).  No recent weight change. Head: No headache and no sinus pain. Cardiovascular: No chest pain or discomfort, no palpitations, and the heart rate was not fast. Pulmonary: No dyspnea, no cough, and no wheezing. Gastrointestinal: No heartburn.  No nausea, no vomiting, no abdominal pain, and no diarrhea. Neurological: No dizziness, no vertigo, and no fainting. Psychological: No anxiety and no depression.     PHYSICAL FINDINGS    Vitals taken 11/07/2022 10:32 am  BP-Sitting L  158/72 mmHg 100 - 120/60 - 80  Temp-Tympanic  97.3 F 96 - 101  Height  69 in 64 - 74  Weight  258 lbs  121 - 205  Body Mass Index  38.1 kg/m2   Body Surface Area  2.3 m2   Oxygen Saturation  98 % 93 - 100     Vitals taken 11/07/2022 10:46 am  BP-Sitting L  132/80 mmHg 100 - 120/60 - 80  BP Cuff Size  Large    General  Appearance:  In no acute distress. Lungs:  Clear to auscultation. Cardiovascular: Jugular Venous Distention:  JVD not increased.   JVD not increased. Heart Rate And Rhythm:  Normal.   Normal. Heart Sounds:  Normal. Murmurs:  No murmurs were heard. Carotid Arteries:  No bruit in the carotid artery. Arterial Pulses:  Equal bilaterally and normal. Edema:  Not present. Abdomen: Auscultation:  Bowel sounds  were normal. Palpation:  Abdominal non-tender. Neurological:  Oriented to time, place, and person.     ASSESSMENT   Atrial fibrillation  Aortic regurgitation  Mitral regurgitation  Systemic hypertension  Pulmonary embolism  Nonorganic sleep apnea  Esophageal reflux  Hyperlipidemia  Obesity     THERAPY   Continue current medication.  Clinical summary provided to patient.     COUNSELING/EDUCATION   Education and counseling diet and exercise     PLAN   Return to the clinic if condition worsens or new symptoms arise  Follow-up visit Patient feeling well. No complaints.      Echo 10/2022  EF 71%  Normal chamber sizes.   Normal left ventricular systolic function.   Mild left ventricular hypertrophy with GRADE 1 (relaxation abnormality) diastolic dysfunction.   Normal right ventricular systolic function.   Normal right ventricular diastolic function.   Normal left ventricular wall motion.   Normal right ventricular wall motion.   Trace pulmonary regurgitation.   Trace tricuspid regurgitation.   Normal pulmonary artery pressure.   Mild mitral regurgitation.   No pericardial effusion.   Mildly dilated Left atirum   Mild LVH.     Return in 4 months.      Adrian Blackwater MD  Electronically signed by: Adrian Blackwater     Date: 11/07/2022 10:50  Current Outpatient Medications  Medication Sig Dispense Refill   acetaminophen (TYLENOL) 325 MG tablet Take 650 mg by mouth every 6 (six) hours as needed for mild pain.     albuterol (VENTOLIN HFA) 108 (90 Base)  MCG/ACT inhaler Inhale 2 puffs into the lungs every 6 (six) hours as needed for wheezing or shortness of breath. (Patient not taking: Reported on 04/23/2023) 18 g 0   amLODipine (NORVASC) 10 MG tablet Take 1 tablet (10 mg total) by mouth daily. 90 tablet 4   celecoxib (CELEBREX) 100 MG capsule TAKE 1 CAPSULE BY MOUTH ONCE DAILY AS NEEDED -  USE  MINIMALLY  SINCE  TAKING  XARELTO 90 capsule 1   Cholecalciferol 25 MCG (1000 UT) capsule Take 1,000 Units by mouth daily.     esomeprazole (NEXIUM) 40 MG capsule Take 1 capsule (40 mg total) by mouth daily at 12 noon. 60 capsule 3   ketoconazole (NIZORAL) 2 % cream Qhs to aa rash groin prn flares also use to both feet nightly for tinea pedis 60 g 11   lisinopril-hydrochlorothiazide (ZESTORETIC) 20-25 MG tablet Take 1 tablet by mouth daily. 90 tablet 4   Multiple Vitamin (MULTIVITAMIN) capsule Take 1 capsule by mouth daily.     omeprazole (PRILOSEC) 20 MG capsule Take 1 capsule (20 mg total) by mouth 2 (two) times daily before a meal. (Patient not taking: Reported on 04/23/2023) 180 capsule 3   rosuvastatin (CRESTOR) 10 MG tablet Take one tablet (10 MG) by mouth three times a week. 36 tablet 4   triamcinolone cream (KENALOG) 0.1 % Apply 1 Application topically as directed. Qd up to 5 days per week to aa eczema on body until clear, avoid face, groin, axilla 80 g 6   XARELTO 20 MG TABS tablet Take 1 tablet (20 mg total) by mouth daily. 30 tablet 3   No current facility-administered medications for this visit.    Allergies:   Adhesive [tape]    Social History:   reports that he has never smoked. He has never used smokeless tobacco. He reports that he does not currently use alcohol. He reports that he does not use drugs.  Family History:  family history includes Arthritis in his brother and mother; Asthma in his brother; Diabetes in his mother and son; Heart disease in his maternal grandmother and paternal grandmother; Hypertension in his brother; Obesity in  his mother; Parkinson's disease in his father; Stroke in his maternal grandfather and paternal grandfather.    ROS:     Review of Systems  Constitutional: Negative.   HENT: Negative.    Eyes: Negative.   Respiratory: Negative.    Gastrointestinal: Negative.   Genitourinary: Negative.   Musculoskeletal: Negative.   Skin: Negative.   Neurological: Negative.   Endo/Heme/Allergies: Negative.   Psychiatric/Behavioral: Negative.    All other systems reviewed and are negative.     All other systems are reviewed and negative.    PHYSICAL EXAM: VS:  BP 131/79   Pulse 86   Ht 5\' 8"  (1.727 m)   Wt 259 lb 9.6 oz (117.8 kg)   SpO2 98%   BMI 39.47 kg/m  , BMI Body mass index is 39.47 kg/m. Last weight:  Wt Readings from Last 3 Encounters:  05/08/23 259 lb 9.6 oz (117.8 kg)  04/23/23 263 lb (119.3 kg)  04/09/23 263 lb 3.2 oz (119.4 kg)     Physical Exam Vitals reviewed.  Constitutional:      Appearance: Normal appearance. He is normal weight.  HENT:     Head: Normocephalic.     Nose: Nose normal.     Mouth/Throat:     Mouth: Mucous membranes are moist.  Eyes:     Pupils: Pupils are equal, round, and reactive to light.  Cardiovascular:     Rate and Rhythm: Normal rate and regular rhythm.     Pulses: Normal pulses.     Heart sounds: Normal heart sounds.  Pulmonary:     Effort: Pulmonary effort is normal.  Abdominal:     General: Abdomen is flat. Bowel sounds are normal.  Musculoskeletal:        General: Normal range of motion.     Cervical back: Normal range of motion.  Skin:    General: Skin is warm.  Neurological:     General: No focal deficit present.     Mental Status: He is alert.  Psychiatric:        Mood and Affect: Mood normal.       EKG:   Recent Labs: 12/15/2022: ALT 18; BUN 18; Creatinine, Ser 1.04; Hemoglobin 13.6; Magnesium 1.9; Platelets 183; Potassium 4.0; Sodium 141; TSH 1.160    Lipid Panel    Component Value Date/Time   CHOL 124  12/15/2022 1029   TRIG 55 12/15/2022 1029   HDL 50 12/15/2022 1029   LDLCALC 62 12/15/2022 1029      REASON FOR VISIT  Visit for: Echocardiogram/I48.91  Sex:   Male         wt= 264   lbs.  BP=130/80  Height= 69   inches.        TESTS  Imaging: Echocardiogram:  An echocardiogram in (2-d) mode was performed and in Doppler mode with color flow velocity mapping was performed. The aortic valve cusps are abnormal 2.3   cm, flow velocity 1.75   m/s, and systolic calculated mean flow gradient 7   mmHg. Mitral valve diastolic peak flow velocity E .614    m/s and E/A ratio 1.1. Aortic root diameter 3.7   cm. The LVOT internal diameter 4.1  cm and flow velocity was abnormal .887   m/s. LV systolic dimension 1.92  cm, diastolic 3.19  cm, posterior wall thickness 1.51 cm, fractional shortening 39.8 %, and EF 71.7 %. IVS thickness 1.46   cm. LA dimension 4.7 cm. Mitral Valve has Mild Regurgitation. Pulmonic Valve has Trace Regurgitation. Tricuspid Valve has Trace Regurgitation.     ASSESSMENT  Technically difficult study due to body habitus.  Normal chamber sizes.  Normal left ventricular systolic function.  Mild left ventricular hypertrophy with GRADE 1 (relaxation abnormality) diastolic dysfunction.  Normal right ventricular systolic function.  Normal right ventricular diastolic function.  Normal left ventricular wall motion.  Normal right ventricular wall motion.  Trace pulmonary regurgitation.  Trace tricuspid regurgitation.  Normal pulmonary artery pressure.  Mild mitral regurgitation.  No pericardial effusion.  Mildly dilated Left atirum  Mild LVH.     THERAPY   Referring physician: Laurier Nancy  Sonographer: Adrian Blackwater.      Adrian Blackwater MD  Electronically signed by: Adrian Blackwater     Date: 10/27/2022 14:13 TESTS                                                                                          [facility] [address] [place], [place] [address] [phone] STUDY:  Gated Stress / Rest Myocardial Perfusion Imaging Tomographic (SPECT) Including attenuation correction Wall Motion, Left Ventricular Ejection Fraction By Gated Technique.Treadmill Stress Test. SEX: Male   WEIGHT: 255 lbs  HEIGHT: 69 in     ARMS UP: YES/NO                                                                        REFERRING PHYSICIAN: Dr.Massey Ruhland Welton Flakes  INDICATION FOR STUDY: Abnormal result of other cardiovascular function study.                                                                                                                                                                                                                    TECHNIQUE:  Approximately 20 minutes following the intravenous administration of 10.8 mCi of Tc-60m Sestamibi after stress testing in a reclined supine position with arms above their head if able to do so, gated SPECT imaging of the heart was performed. After about a 2hr break, the patient was injected intravenously with 30.6 mCi of Tc-40m Sestamibi.  Approximately 45 minutes later in the same position as stress imaging SPECT rest imaging of the heart was performed.  STRESS BY:  Adrian Blackwater, MD PROTOCOL:   Smitty Cords                                                                                       MAX PRED HR: 149                     85%: 128               75%: 112                                                                                                                   RESTING BP: 148/84   RESTING HR: 93  PEAK BP: 150/84  PEAK HR: 122 (81%)  EXERCISE DURATION: 3:24                                             METS: 4.5     REASON FOR TEST  TERMINATION: Fatigue                                                                                                                                  SYMPTOMS: Fatigue [facility] TREADMILL SCORE: 3                                       RISK: Moderate                                                                                                                                                                                                            EKG RESULTS: NSR. 70/min. Occasional PVC's. No significant ST changes at peak exercise.                                                              IMAGE QUALITY: Good  PERFUSION/WALL MOTION FINDINGS: EF = 68%. Small mild fixed mid anteroseptal and mid inferoseptal wall defects, normal wall motion.                                                                          IMPRESSION: Equivocal stress test with normal LVEF, advise CCTA.                                                                                                                                                                                                                                                                                         Adrian Blackwater, MD Stress Interpreting Physician / Nuclear Interpreting Physician                         Adrian Blackwater MD  Electronically signed by: Adrian Blackwater     Date: 05/02/2021 14:44 Other studies Reviewed: Additional studies/ records that were reviewed today include:  Review of the above records demonstrates:      01/21/2018    4:36 PM  PAD Screen  Previous PAD dx? No  Previous surgical procedure? No  Pain with walking? No   Feet/toe relief with dangling? No  Painful, non-healing ulcers? No  Extremities discolored? No      ASSESSMENT AND PLAN:    ICD-10-CM   1. Paroxysmal atrial fibrillation (HCC)  I48.0 XARELTO 20 MG TABS tablet   In NSR, feeling better    2. Essential hypertension, benign  I10 XARELTO 20 MG TABS tablet    3. Obstructive sleep apnea syndrome  G47.33 XARELTO 20 MG TABS tablet    4. Mixed hyperlipidemia  E78.2 XARELTO 20 MG TABS tablet    5. Morbid obesity (HCC)  E66.01 XARELTO 20 MG TABS tablet    6. Nonrheumatic mitral valve regurgitation  I34.0 XARELTO 20 MG TABS tablet   mild  on echo 1/24, normal LVEF       Problem List Items Addressed This Visit       Cardiovascular and Mediastinum   Essential hypertension, benign   Relevant Medications   XARELTO 20 MG TABS tablet   Atrial fibrillation (HCC) - Primary   Relevant Medications   XARELTO 20 MG TABS tablet     Respiratory   Sleep apnea   Relevant Medications   XARELTO 20 MG TABS tablet     Other   Morbid obesity (HCC)   Relevant Medications   XARELTO 20 MG TABS tablet   Mixed hyperlipidemia   Relevant Medications   XARELTO 20 MG TABS tablet   Other Visit Diagnoses     Nonrheumatic mitral valve regurgitation       mild on echo 1/24, normal LVEF   Relevant Medications   XARELTO 20 MG TABS tablet          Disposition:   Return in about 3 months (around 08/08/2023).    Total time spent: 30 minutes  Signed,  Adrian Blackwater, MD  05/08/2023 9:41 AM    [facility]

## 2023-06-06 ENCOUNTER — Encounter: Payer: Self-pay | Admitting: Gastroenterology

## 2023-06-12 ENCOUNTER — Encounter: Payer: Self-pay | Admitting: Gastroenterology

## 2023-06-12 ENCOUNTER — Ambulatory Visit: Payer: Medicare PPO | Admitting: Nurse Practitioner

## 2023-06-12 ENCOUNTER — Other Ambulatory Visit: Payer: Self-pay

## 2023-06-12 ENCOUNTER — Ambulatory Visit
Admission: RE | Admit: 2023-06-12 | Discharge: 2023-06-12 | Disposition: A | Discharge status: Home / Self Care | Payer: Medicare PPO | Attending: Gastroenterology | Admitting: Gastroenterology

## 2023-06-12 ENCOUNTER — Ambulatory Visit: Payer: Medicare PPO | Admitting: Anesthesiology

## 2023-06-12 ENCOUNTER — Encounter
Admission: RE | Disposition: A | Discharge status: Home / Self Care | Payer: Self-pay | Source: Home / Self Care | Attending: Gastroenterology

## 2023-06-12 DIAGNOSIS — D125 Benign neoplasm of sigmoid colon: Secondary | ICD-10-CM | POA: Diagnosis not present

## 2023-06-12 DIAGNOSIS — Z8546 Personal history of malignant neoplasm of prostate: Secondary | ICD-10-CM | POA: Insufficient documentation

## 2023-06-12 DIAGNOSIS — I4891 Unspecified atrial fibrillation: Secondary | ICD-10-CM | POA: Insufficient documentation

## 2023-06-12 DIAGNOSIS — Z1211 Encounter for screening for malignant neoplasm of colon: Secondary | ICD-10-CM | POA: Insufficient documentation

## 2023-06-12 DIAGNOSIS — Z96653 Presence of artificial knee joint, bilateral: Secondary | ICD-10-CM | POA: Insufficient documentation

## 2023-06-12 DIAGNOSIS — Z86711 Personal history of pulmonary embolism: Secondary | ICD-10-CM | POA: Insufficient documentation

## 2023-06-12 DIAGNOSIS — K449 Diaphragmatic hernia without obstruction or gangrene: Secondary | ICD-10-CM | POA: Insufficient documentation

## 2023-06-12 DIAGNOSIS — K640 First degree hemorrhoids: Secondary | ICD-10-CM | POA: Diagnosis not present

## 2023-06-12 DIAGNOSIS — E782 Mixed hyperlipidemia: Secondary | ICD-10-CM | POA: Diagnosis not present

## 2023-06-12 DIAGNOSIS — K22710 Barrett's esophagus with low grade dysplasia: Secondary | ICD-10-CM | POA: Diagnosis not present

## 2023-06-12 DIAGNOSIS — Z9884 Bariatric surgery status: Secondary | ICD-10-CM | POA: Diagnosis not present

## 2023-06-12 DIAGNOSIS — G4733 Obstructive sleep apnea (adult) (pediatric): Secondary | ICD-10-CM | POA: Diagnosis not present

## 2023-06-12 DIAGNOSIS — K22719 Barrett's esophagus with dysplasia, unspecified: Secondary | ICD-10-CM | POA: Diagnosis not present

## 2023-06-12 DIAGNOSIS — M1990 Unspecified osteoarthritis, unspecified site: Secondary | ICD-10-CM | POA: Insufficient documentation

## 2023-06-12 DIAGNOSIS — K219 Gastro-esophageal reflux disease without esophagitis: Secondary | ICD-10-CM | POA: Insufficient documentation

## 2023-06-12 DIAGNOSIS — I10 Essential (primary) hypertension: Secondary | ICD-10-CM | POA: Diagnosis not present

## 2023-06-12 DIAGNOSIS — R1310 Dysphagia, unspecified: Secondary | ICD-10-CM

## 2023-06-12 DIAGNOSIS — D126 Benign neoplasm of colon, unspecified: Secondary | ICD-10-CM

## 2023-06-12 DIAGNOSIS — Z923 Personal history of irradiation: Secondary | ICD-10-CM | POA: Insufficient documentation

## 2023-06-12 DIAGNOSIS — Z9049 Acquired absence of other specified parts of digestive tract: Secondary | ICD-10-CM | POA: Diagnosis not present

## 2023-06-12 DIAGNOSIS — Z86010 Personal history of colonic polyps: Secondary | ICD-10-CM

## 2023-06-12 DIAGNOSIS — Z86718 Personal history of other venous thrombosis and embolism: Secondary | ICD-10-CM | POA: Insufficient documentation

## 2023-06-12 DIAGNOSIS — K635 Polyp of colon: Secondary | ICD-10-CM | POA: Diagnosis not present

## 2023-06-12 HISTORY — PX: BIOPSY: SHX5522

## 2023-06-12 HISTORY — PX: COLONOSCOPY WITH PROPOFOL: SHX5780

## 2023-06-12 HISTORY — PX: ESOPHAGOGASTRODUODENOSCOPY (EGD) WITH PROPOFOL: SHX5813

## 2023-06-12 HISTORY — PX: POLYPECTOMY: SHX5525

## 2023-06-12 SURGERY — COLONOSCOPY WITH PROPOFOL
Anesthesia: General

## 2023-06-12 MED ORDER — LIDOCAINE HCL (PF) 2 % IJ SOLN
INTRAMUSCULAR | Status: AC
  Filled 2023-06-12: qty 5

## 2023-06-12 MED ORDER — GLYCOPYRROLATE 0.2 MG/ML IJ SOLN
INTRAMUSCULAR | Status: AC
  Filled 2023-06-12: qty 1

## 2023-06-12 MED ORDER — PROPOFOL 10 MG/ML IV BOLUS
INTRAVENOUS | Status: DC | PRN
  Administered 2023-06-12: 100 mg via INTRAVENOUS
  Administered 2023-06-12 (×6): 10 mg via INTRAVENOUS
  Administered 2023-06-12: 90 ug/kg/min via INTRAVENOUS

## 2023-06-12 MED ORDER — SODIUM CHLORIDE 0.9 % IV SOLN: INTRAVENOUS | Status: DC

## 2023-06-12 MED ORDER — LIDOCAINE HCL (CARDIAC) PF 100 MG/5ML IV SOSY
PREFILLED_SYRINGE | INTRAVENOUS | Status: DC | PRN
  Administered 2023-06-12: 100 mg via INTRAVENOUS

## 2023-06-12 NOTE — Anesthesia Preprocedure Evaluation (Signed)
Anesthesia Evaluation  Patient identified by MRN, date of birth, ID band Patient awake    Reviewed: Allergy & Precautions, NPO status , Patient's Chart, lab work & pertinent test results  History of Anesthesia Complications (+) PONV and history of anesthetic complications  Airway Mallampati: III  TM Distance: <3 FB Neck ROM: full    Dental  (+) Chipped   Pulmonary neg shortness of breath, sleep apnea    Pulmonary exam normal        Cardiovascular Exercise Tolerance: Good hypertension, + dysrhythmias Atrial Fibrillation      Neuro/Psych negative neurological ROS  negative psych ROS   GI/Hepatic Neg liver ROS, hiatal hernia,GERD  Controlled,,  Endo/Other  negative endocrine ROS    Renal/GU negative Renal ROS  negative genitourinary   Musculoskeletal   Abdominal   Peds  Hematology negative hematology ROS (+)   Anesthesia Other Findings Past Medical History: No date: Arthritis No date: Atrial fibrillation (HCC) No date: Barrett's esophagus 2008: Cancer [facility])     Comment:  Prostate-radiation 2019: Cataract     Comment:  not yet significant enough to warrant surgery No date: Dysrhythmia     Comment:  Afib No date: GERD (gastroesophageal reflux disease) No date: History of DVT (deep vein thrombosis) No date: History of hiatal hernia No date: History of kidney stones No date: Hypertension No date: Joint pain No date: Mixed hyperlipidemia 2013: Pancreatitis     Comment:  [facility] No date: Prostate cancer (HCC) 12/2014: Pulmonary embolus (HCC)     Comment:  Bilateral pulmonary embolus, [facility] 2010: Sleep apnea     Comment:  use CPAP regularly No date: Sleep apnea, obstructive     Comment:  cpap  Past Surgical History: 03/15/2017: CARPAL TUNNEL RELEASE; Right     Comment:  Procedure: CARPAL TUNNEL RELEASE;  Surgeon: Kennedy Bucker, MD;  Location: [facility] ORS;  Service: Orthopedics;                Laterality: Right; No date: CHOLECYSTECTOMY 04/02/2018: COLONOSCOPY WITH PROPOFOL; N/A     Comment:  Procedure: COLONOSCOPY WITH PROPOFOL;  Surgeon: Midge Minium, MD;  Location: [facility] ENDOSCOPY;  Service:               Endoscopy;  Laterality: N/A; 05/28/2013: ESOPHAGOGASTRODUODENOSCOPY; N/A     Comment:  Procedure: ESOPHAGOGASTRODUODENOSCOPY (EGD);  Surgeon:               Kandis Cocking, MD;  Location: [facility] ENDOSCOPY;  Service:               General;  Laterality: N/A; 02/08/2021: ESOPHAGOGASTRODUODENOSCOPY (EGD) WITH PROPOFOL; N/A     Comment:  Procedure: ESOPHAGOGASTRODUODENOSCOPY (EGD) WITH               PROPOFOL;  Surgeon: Pasty Spillers, MD;  Location:               [facility] ENDOSCOPY;  Service: Endoscopy;  Laterality: N/A; 2003: GALLBLADDER SURGERY 03/15/2017: GANGLION CYST EXCISION; Right     Comment:  Procedure: REMOVAL GANGLION OF WRIST;  Surgeon: Kennedy Bucker, MD;  Location: [facility] ORS;  Service: Orthopedics;               Laterality: Right; No date: HERNIA REPAIR 05/31/2021: HIATAL HERNIA  REPAIR; N/A     Comment:  Procedure: LAPAROSCOPIC REPAIR OF HIATAL HERNIA WITH LAP              BAND REMOVAL;  Surgeon: Kinsinger, De Blanch, MD;                Location: [facility] ORS;  Service: General;  Laterality: N/A; No date: JOINT REPLACEMENT [id number]: KNEE ARTHROSCOPY; Bilateral 2009: LAPAROSCOPIC GASTRIC BANDING     Comment:  and hernia repair 05/31/2021: LAPAROSCOPIC REPAIR OF HIATAL HERNIA WITH LAP BAND  REMOVAL  2005: RADIATION IMPLANT W/ ULTRASOUND, MALE     Comment:  internal/external radiation for prostate cancer 10/12/2015: TOTAL KNEE ARTHROPLASTY; Left     Comment:  Procedure: TOTAL KNEE ARTHROPLASTY;  Surgeon: Kennedy Bucker, MD;  Location: [facility] ORS;  Service: Orthopedics;                Laterality: Left; 05/15/2017: TOTAL KNEE ARTHROPLASTY; Right     Comment:  Procedure: TOTAL KNEE ARTHROPLASTY;  Surgeon: Kennedy Bucker,  MD;  Location: [facility] ORS;  Service: Orthopedics;               Laterality: Right; 2010: UMBILICAL HERNIA REPAIR     Reproductive/Obstetrics negative OB ROS                             Anesthesia Physical Anesthesia Plan  ASA: 3  Anesthesia Plan: General   Post-op Pain Management:    Induction: Intravenous  PONV Risk Score and Plan: Propofol infusion and TIVA  Airway Management Planned: Natural Airway and Nasal Cannula  Additional Equipment:   Intra-op Plan:   Post-operative Plan:   Informed Consent: I have reviewed the patients History and Physical, chart, labs and discussed the procedure including the risks, benefits and alternatives for the proposed anesthesia with the patient or authorized representative who has indicated his/her understanding and acceptance.     Dental Advisory Given  Plan Discussed with: Anesthesiologist, CRNA and Surgeon  Anesthesia Plan Comments: (Patient consented for risks of anesthesia including but not limited to:  - adverse reactions to medications - risk of airway placement if required - damage to eyes, teeth, lips or other oral mucosa - nerve damage due to positioning  - sore throat or hoarseness - Damage to heart, brain, nerves, lungs, other parts of body or loss of life  Patient voiced understanding.)       Anesthesia Quick Evaluation

## 2023-06-12 NOTE — Transfer of Care (Signed)
Immediate Anesthesia Transfer of Care Note  Patient: AYOBAMI VELIE  Procedure(s) Performed: COLONOSCOPY WITH PROPOFOL ESOPHAGOGASTRODUODENOSCOPY (EGD) WITH PROPOFOL BIOPSY  Patient Location: PACU  Anesthesia Type:General  Level of Consciousness: drowsy  Airway & Oxygen Therapy: Patient Spontanous Breathing and Patient connected to nasal cannula oxygen  Post-op Assessment: Report given to RN and Post -op Vital signs reviewed and stable  Post vital signs: Reviewed and stable  Last Vitals:  Vitals Value Taken Time  BP 120/73 06/12/23 0947  Temp 35.9 C 06/12/23 0947  Pulse 73 06/12/23 0948  Resp 17 06/12/23 0948  SpO2 98 % 06/12/23 0948  Vitals shown include unfiled device data.  Last Pain:  Vitals:   06/12/23 0947  TempSrc: Temporal  PainSc: Asleep         Complications: No notable events documented.

## 2023-06-12 NOTE — Anesthesia Postprocedure Evaluation (Signed)
Anesthesia Post Note  Patient: Grant Diaz  Procedure(s) Performed: COLONOSCOPY WITH PROPOFOL ESOPHAGOGASTRODUODENOSCOPY (EGD) WITH PROPOFOL BIOPSY POLYPECTOMY  Patient location during evaluation: Endoscopy Anesthesia Type: General Level of consciousness: awake and alert Pain management: pain level controlled Vital Signs Assessment: post-procedure vital signs reviewed and stable Respiratory status: spontaneous breathing, nonlabored ventilation, respiratory function stable and patient connected to nasal cannula oxygen Cardiovascular status: blood pressure returned to baseline and stable Postop Assessment: no apparent nausea or vomiting Anesthetic complications: no   No notable events documented.   Last Vitals:  Vitals:   06/12/23 0947 06/12/23 1007  BP: 120/73   Pulse: 74 75  Resp: 11   Temp: (!) 35.9 C   SpO2: 98% 99%    Last Pain:  Vitals:   06/12/23 1007  TempSrc:   PainSc: 0-No pain                 Cleda Mccreedy Cheron Coryell

## 2023-06-12 NOTE — H&P (Signed)
[name], MD [address], [address], [place], [place], [address] [address], [place], [place], [address] Phone: [phone]  Fax: [phone]  Primary Care Physician:  Marjie Skiff, NP   Pre-Procedure History & Physical: HPI:  Grant Diaz is a 72 y.o. male is here for an endoscopy and colonoscopy    Past Medical History:  Diagnosis Date   Arthritis    Atrial fibrillation (HCC)    Barrett's esophagus    Cancer (HCC) 2008   Prostate-radiation   Cataract 2019   not yet significant enough to warrant surgery   Dysrhythmia    Afib   GERD (gastroesophageal reflux disease)    History of DVT (deep vein thrombosis)    History of hiatal hernia    History of kidney stones    Hypertension    Joint pain    Mixed hyperlipidemia    Pancreatitis 2013   [facility]   Prostate cancer [facility])    Pulmonary embolus (HCC) 12/2014   Bilateral pulmonary embolus, [facility]   Sleep apnea 2010   use CPAP regularly   Sleep apnea, obstructive    cpap    Past Surgical History:  Procedure Laterality Date   CARPAL TUNNEL RELEASE Right 03/15/2017   Procedure: CARPAL TUNNEL RELEASE;  Surgeon: Kennedy Bucker, MD;  Location: [facility] ORS;  Service: Orthopedics;  Laterality: Right;   CHOLECYSTECTOMY     COLONOSCOPY WITH PROPOFOL N/A 04/02/2018   Procedure: COLONOSCOPY WITH PROPOFOL;  Surgeon: Midge Minium, MD;  Location: [facility] ENDOSCOPY;  Service: Endoscopy;  Laterality: N/A;   ESOPHAGOGASTRODUODENOSCOPY N/A 05/28/2013   Procedure: ESOPHAGOGASTRODUODENOSCOPY (EGD);  Surgeon: Kandis Cocking, MD;  Location: [facility];  Service: General;  Laterality: N/A;   ESOPHAGOGASTRODUODENOSCOPY (EGD) WITH PROPOFOL N/A 02/08/2021   Procedure: ESOPHAGOGASTRODUODENOSCOPY (EGD) WITH PROPOFOL;  Surgeon: Pasty Spillers, MD;  Location: [facility] ENDOSCOPY;  Service: Endoscopy;  Laterality: N/A;   GALLBLADDER SURGERY  2003   GANGLION CYST EXCISION Right 03/15/2017   Procedure: REMOVAL GANGLION OF WRIST;   Surgeon: Kennedy Bucker, MD;  Location: [facility] ORS;  Service: Orthopedics;  Laterality: Right;   HERNIA REPAIR     HIATAL HERNIA REPAIR N/A 05/31/2021   Procedure: LAPAROSCOPIC REPAIR OF HIATAL HERNIA WITH LAP BAND REMOVAL;  Surgeon: Sheliah Hatch De Blanch, MD;  Location: [facility] ORS;  Service: General;  Laterality: N/A;   JOINT REPLACEMENT     KNEE ARTHROSCOPY Bilateral [id number]   LAPAROSCOPIC GASTRIC BANDING  2009   and hernia repair   LAPAROSCOPIC REPAIR OF HIATAL HERNIA WITH LAP BAND REMOVAL   05/31/2021   RADIATION IMPLANT W/ ULTRASOUND, MALE  2005   internal/external radiation for prostate cancer   TOTAL KNEE ARTHROPLASTY Left 10/12/2015   Procedure: TOTAL KNEE ARTHROPLASTY;  Surgeon: Kennedy Bucker, MD;  Location: [facility] ORS;  Service: Orthopedics;  Laterality: Left;   TOTAL KNEE ARTHROPLASTY Right 05/15/2017   Procedure: TOTAL KNEE ARTHROPLASTY;  Surgeon: Kennedy Bucker, MD;  Location: [facility] ORS;  Service: Orthopedics;  Laterality: Right;   UMBILICAL HERNIA REPAIR  2010    Prior to Admission medications   Medication Sig Start Date End Date Taking? Authorizing Provider  acetaminophen (TYLENOL) 325 MG tablet Take 650 mg by mouth every 6 (six) hours as needed for mild pain.    [provider]  albuterol (VENTOLIN HFA) 108 (90 Base) MCG/ACT inhaler Inhale 2 puffs into the lungs every 6 (six) hours as needed for wheezing or shortness of breath. 01/18/22   Marjie Skiff, NP  amLODipine (NORVASC) 10 MG tablet Take 1 tablet (10 mg total) by mouth daily. 12/15/22   Cannady, Corrie Dandy T, NP  celecoxib (CELEBREX) 100 MG capsule TAKE 1 CAPSULE BY MOUTH ONCE DAILY AS NEEDED -  USE  MINIMALLY  SINCE  TAKING  XARELTO 02/08/23   Aura Dials T, NP  Cholecalciferol 25 MCG (1000 UT) capsule Take 1,000 Units by mouth daily.    [provider]  esomeprazole (NEXIUM) 40 MG capsule Take 1 capsule (40 mg total) by mouth daily at 12 noon. 04/12/23   Celso Amy, PA-C  ketoconazole (NIZORAL) 2 % cream  Qhs to aa rash groin prn flares also use to both feet nightly for tinea pedis 02/07/23   Deirdre Evener, MD  lisinopril-hydrochlorothiazide (ZESTORETIC) 20-25 MG tablet Take 1 tablet by mouth daily. 12/15/22   Aura Dials T, NP  Multiple Vitamin (MULTIVITAMIN) capsule Take 1 capsule by mouth daily.    [provider]  omeprazole (PRILOSEC) 20 MG capsule Take 1 capsule (20 mg total) by mouth 2 (two) times daily before a meal. 04/09/23 04/03/24  Celso Amy, PA-C  rosuvastatin (CRESTOR) 10 MG tablet Take one tablet (10 MG) by mouth three times a week. 12/15/22   Cannady, Corrie Dandy T, NP  triamcinolone cream (KENALOG) 0.1 % Apply 1 Application topically as directed. Qd up to 5 days per week to aa eczema on body until clear, avoid face, groin, axilla 08/02/22   Deirdre Evener, MD  XARELTO 20 MG TABS tablet Take 1 tablet (20 mg total) by mouth daily. 05/08/23   Laurier Nancy, MD    Allergies as of 04/09/2023 - Review Complete 04/09/2023  Allergen Reaction Noted   Adhesive [tape] Rash 09/29/2015    Family History  Problem Relation Age of Onset   Diabetes Mother    Arthritis Mother    Obesity Mother    Parkinson's disease Father    Hypertension Brother    Arthritis Brother    Asthma Brother    Diabetes Son        type 1   Heart disease Maternal Grandmother        CHF   Stroke Maternal Grandfather    Heart disease Paternal Grandmother        CHF   Stroke Paternal Grandfather     Social History   Socioeconomic History   Marital status: Married    Spouse name: Not on file   Number of children: Not on file   Years of education: Not on file   Highest education level: Not on file  Occupational History   Occupation: pastor   Tobacco Use   Smoking status: Never   Smokeless tobacco: Never  Vaping Use   Vaping status: Never Used  Substance and Sexual Activity   Alcohol use: Not Currently    Comment: quit using alcohol upon entering ministry 1977   Drug use: Never    Sexual activity: Not Currently    Birth control/protection: None  Other Topics Concern   Not on file  Social History Narrative   Not on file   Social Determinants of Health   Financial Resource Strain: Low Risk  (04/23/2023)   Overall Financial Resource Strain (CARDIA)    Difficulty of Paying Living Expenses: Not hard at all  Food Insecurity: No Food Insecurity (04/23/2023)   Hunger Vital Sign    Worried About Running Out of Food in the Last Year: Never true    Ran Out of Food in the Last Year:  Never true  Transportation Needs: No Transportation Needs (04/23/2023)   PRAPARE - Administrator, Civil Service (Medical): No    Lack of Transportation (Non-Medical): No  Physical Activity: Sufficiently Active (04/23/2023)   Exercise Vital Sign    Days of Exercise per Week: 3 days    Minutes of Exercise per Session: 60 min  Stress: No Stress Concern Present (04/23/2023)   [employer] of Occupational Health - Occupational Stress Questionnaire    Feeling of Stress : Not at all  Social Connections: Moderately Integrated (04/23/2023)   Social Connection and Isolation Panel [NHANES]    Frequency of Communication with Friends and Family: Three times a week    Frequency of Social Gatherings with Friends and Family: Three times a week    Attends Religious Services: More than 4 times per year    Active Member of Clubs or Organizations: No    Attends Banker Meetings: Never    Marital Status: Married  Catering manager Violence: Not At Risk (04/23/2023)   Humiliation, Afraid, Rape, and Kick questionnaire    Fear of Current or Ex-Partner: No    Emotionally Abused: No    Physically Abused: No    Sexually Abused: No    Review of Systems: See HPI, otherwise negative ROS  Physical Exam: There were no vitals taken for this visit. General:   Alert,  pleasant and cooperative in NAD Head:  Normocephalic and atraumatic. Neck:  Supple; no masses or thyromegaly. Lungs:  Clear  throughout to auscultation, normal respiratory effort.    Heart:  +S1, +S2, Regular rate and rhythm, No edema. Abdomen:  Soft, nontender and nondistended. Normal bowel sounds, without guarding, and without rebound.   Neurologic:  Alert and  oriented x4;  grossly normal neurologically.  Impression/Plan: PRINTIS ECKENROD is here for an endoscopy and colonoscopy  to be performed for  evaluation of dysphagia, colon cancer screening     Risks, benefits, limitations, and alternatives regarding endoscopy have been reviewed with the patient.  Questions have been answered.  All parties agreeable.   [name], MD  06/12/2023, 8:45 AM

## 2023-06-12 NOTE — Op Note (Signed)
[facility] Gastroenterology Patient Name: Grant Diaz Procedure Date: 06/12/2023 9:01 AM MRN: [id number] Account #: [id number] Date of Birth: 03/04/1951 Admit Type: Outpatient Age: 72 Room: [facility] ENDO ROOM 2 Gender: Male Note Status: Finalized Instrument Name: Nelda Marseille [id number] Procedure:             Colonoscopy Indications:           Surveillance: Personal history of colonic polyps                         (unknown histology) on last colonoscopy more than 3                         years ago, Last colonoscopy: June 2019 Providers:             [name] MD, MD Referring MD:          Dorie Rank. Harvest Dark (Referring MD) Medicines:             Monitored Anesthesia Care Complications:         No immediate complications. Procedure:             Pre-Anesthesia Assessment:                        - Prior to the procedure, a History and Physical was                         performed, and patient medications, allergies and                         sensitivities were reviewed. The patient's tolerance                         of previous anesthesia was reviewed.                        - The risks and benefits of the procedure and the                         sedation options and risks were discussed with the                         patient. All questions were answered and informed                         consent was obtained.                        - ASA Grade Assessment: II - A patient with mild                         systemic disease.                        After obtaining informed consent, the colonoscope was                         passed under direct vision. Throughout the procedure,  the patient's blood pressure, pulse, and oxygen                         saturations were monitored continuously. The                         Colonoscope was introduced through the anus and                         advanced to the the cecum, identified by the                          appendiceal orifice. The colonoscopy was performed                         with ease. The patient tolerated the procedure well.                         The quality of the bowel preparation was excellent.                         The ileocecal valve, appendiceal orifice, and rectum                         were photographed. Findings:      A 5 mm polyp was found in the sigmoid colon. The polyp was sessile. The       polyp was removed with a cold snare. Resection and retrieval were       complete.      Non-bleeding internal hemorrhoids were found during retroflexion. The       hemorrhoids were large and Grade I (internal hemorrhoids that do not       prolapse).      The exam was otherwise without abnormality on direct and retroflexion       views. Impression:            - One 5 mm polyp in the sigmoid colon, removed with a                         cold snare. Resected and retrieved.                        - Non-bleeding internal hemorrhoids.                        - The examination was otherwise normal on direct and                         retroflexion views. Recommendation:        - Discharge patient to home (with escort).                        - Resume previous diet.                        - Continue present medications.                        - Await pathology results.                        -  Repeat colonoscopy is not recommended due to current                         age (29 years or older) for surveillance. Procedure Code(s):     --- Professional ---                        [phone], Colonoscopy, flexible; with removal of                         tumor(s), polyp(s), or other lesion(s) by snare                         technique Diagnosis Code(s):     --- Professional ---                        Z86.010, Personal history of colonic polyps                        D12.5, Benign neoplasm of sigmoid colon                        K64.0, First degree hemorrhoids CPT copyright 2022  American Medical Association. All rights reserved. The codes documented in this report are preliminary and upon coder review may  be revised to meet current compliance requirements. [name], MD [name] MD, MD 06/12/2023 9:47:15 AM This report has been signed electronically. Number of Addenda: 0 Note Initiated On: 06/12/2023 9:01 AM Scope Withdrawal Time: 0 hours 9 minutes 25 seconds  Total Procedure Duration: 0 hours 13 minutes 17 seconds  Estimated Blood Loss:  Estimated blood loss: none.      [facility]

## 2023-06-12 NOTE — Op Note (Signed)
[facility] Gastroenterology Patient Name: Grant Diaz Procedure Date: 06/12/2023 9:02 AM MRN: [id number] Account #: [id number] Date of Birth: May 05, 1951 Admit Type: Outpatient Age: 72 Room: [facility] ENDO ROOM 2 Gender: Male Note Status: Finalized Instrument Name: Upper Endoscope [id number] Procedure:             Upper GI endoscopy Indications:           Dysphagia Providers:             [name] MD, MD Referring MD:          Dorie Rank. Harvest Dark (Referring MD) Medicines:             Monitored Anesthesia Care Complications:         No immediate complications. Procedure:             Pre-Anesthesia Assessment:                        - Prior to the procedure, a History and Physical was                         performed, and patient medications, allergies and                         sensitivities were reviewed. The patient's tolerance                         of previous anesthesia was reviewed.                        - The risks and benefits of the procedure and the                         sedation options and risks were discussed with the                         patient. All questions were answered and informed                         consent was obtained.                        - ASA Grade Assessment: II - A patient with mild                         systemic disease.                        After obtaining informed consent, the endoscope was                         passed under direct vision. Throughout the procedure,                         the patient's blood pressure, pulse, and oxygen                         saturations were monitored continuously. The Endoscope                         was introduced through  the mouth, and advanced to the                         third part of duodenum. The upper GI endoscopy was                         accomplished with ease. The patient tolerated the                         procedure well. Findings:      The examined duodenum was  normal.      A medium-sized hiatal hernia was present.      One tongue of salmon-colored mucosa was present. No other visible       abnormalities were present. The maximum longitudinal extent of these       esophageal mucosal changes was 1 cm in length. Biopsies were taken with       a cold forceps for histology.      The exam was otherwise without abnormality. Impression:            - Normal examined duodenum.                        - Medium-sized hiatal hernia.                        - Salmon-colored mucosa suspicious for short-segment                         Barrett's esophagus. Biopsied.                        - The examination was otherwise normal. Recommendation:        - Await pathology results.                        - Likely dysphagia from reflux , hiatal hernia Procedure Code(s):     --- Professional ---                        [phone], Esophagogastroduodenoscopy, flexible,                         transoral; with biopsy, single or multiple Diagnosis Code(s):     --- Professional ---                        K22.89, Other specified disease of esophagus                        K44.9, Diaphragmatic hernia without obstruction or                         gangrene                        R13.10, Dysphagia, unspecified CPT copyright 2022 American Medical Association. All rights reserved. The codes documented in this report are preliminary and upon coder review may  be revised to meet current compliance requirements. [name], MD [name] MD, MD 06/12/2023 9:29:48 AM This report has been signed electronically. Number of Addenda: 0 Note Initiated On: 06/12/2023 9:02 AM Estimated  Blood Loss:  Estimated blood loss: none.      [facility]

## 2023-06-13 ENCOUNTER — Encounter: Payer: Self-pay | Admitting: Gastroenterology

## 2023-06-14 ENCOUNTER — Telehealth: Payer: Self-pay

## 2023-06-14 DIAGNOSIS — K22710 Barrett's esophagus with low grade dysplasia: Secondary | ICD-10-CM

## 2023-06-14 MED ORDER — OMEPRAZOLE 40 MG PO CPDR: 40.0000 mg | DELAYED_RELEASE_CAPSULE | Freq: Two times a day (BID) | ORAL | 3 refills | Status: DC

## 2023-06-14 NOTE — Telephone Encounter (Signed)
Patient called back to say he was confused that its Nexium he is currently taking -so I sent the prescription for Prilosec 40 mg twice daily to pharmacy.

## 2023-06-14 NOTE — Telephone Encounter (Signed)
Thank you :)

## 2023-06-14 NOTE — Progress Notes (Signed)
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

## 2023-06-14 NOTE — Telephone Encounter (Signed)
Spoke with patient regarding the message below. I provided contact information to Dr.Nicolas Enrigue Catena [phone] and let the patient know I am sending Urgent referral.  Instructed patient to take Prilosec 40 mg BID. 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

## 2023-06-14 NOTE — Telephone Encounter (Signed)
    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

## 2023-06-22 ENCOUNTER — Ambulatory Visit: Payer: Medicare PPO | Admitting: Nurse Practitioner

## 2023-06-22 DIAGNOSIS — I480 Paroxysmal atrial fibrillation: Secondary | ICD-10-CM

## 2023-06-22 DIAGNOSIS — D6869 Other thrombophilia: Secondary | ICD-10-CM

## 2023-06-22 DIAGNOSIS — K2270 Barrett's esophagus without dysplasia: Secondary | ICD-10-CM

## 2023-06-22 DIAGNOSIS — G4733 Obstructive sleep apnea (adult) (pediatric): Secondary | ICD-10-CM

## 2023-06-22 DIAGNOSIS — E782 Mixed hyperlipidemia: Secondary | ICD-10-CM

## 2023-06-22 DIAGNOSIS — I10 Essential (primary) hypertension: Secondary | ICD-10-CM

## 2023-06-22 DIAGNOSIS — D696 Thrombocytopenia, unspecified: Secondary | ICD-10-CM

## 2023-06-22 DIAGNOSIS — D692 Other nonthrombocytopenic purpura: Secondary | ICD-10-CM

## 2023-07-04 ENCOUNTER — Ambulatory Visit: Payer: Medicare PPO

## 2023-07-05 ENCOUNTER — Telehealth: Payer: Self-pay

## 2023-07-05 NOTE — Telephone Encounter (Signed)
Spoke with [facility] to check status of referral-Patient scheduled 07-09-23 @ 9:10 am.

## 2023-07-09 DIAGNOSIS — K219 Gastro-esophageal reflux disease without esophagitis: Secondary | ICD-10-CM | POA: Diagnosis not present

## 2023-07-09 DIAGNOSIS — K22710 Barrett's esophagus with low grade dysplasia: Secondary | ICD-10-CM | POA: Diagnosis not present

## 2023-07-09 DIAGNOSIS — R1310 Dysphagia, unspecified: Secondary | ICD-10-CM | POA: Diagnosis not present

## 2023-07-10 ENCOUNTER — Ambulatory Visit: Payer: Medicare PPO | Admitting: Physician Assistant

## 2023-08-04 ENCOUNTER — Other Ambulatory Visit: Payer: Self-pay | Admitting: Nurse Practitioner

## 2023-08-06 NOTE — Telephone Encounter (Signed)
Requested Prescriptions  Pending Prescriptions Disp Refills   celecoxib (CELEBREX) 100 MG capsule [Pharmacy Med Name: Celecoxib 100 MG Oral Capsule] 90 capsule 0    Sig: TAKE 1 CAPSULE BY MOUTH ONCE DAILY AS NEEDED USE MINIMALLY SINCE TAKING XARELTO     Analgesics:  COX2 Inhibitors Failed - 08/04/2023  9:21 AM      Failed - Manual Review: Labs are only required if the patient has taken medication for more than 8 weeks.      Passed - HGB in normal range and within 360 days    Hemoglobin  Date Value Ref Range Status  12/15/2022 13.6 13.0 - 17.7 g/dL Final         Passed - Cr in normal range and within 360 days    Creatinine, Ser  Date Value Ref Range Status  12/15/2022 1.04 0.76 - 1.27 mg/dL Final         Passed - HCT in normal range and within 360 days    Hematocrit  Date Value Ref Range Status  12/15/2022 40.5 37.5 - 51.0 % Final         Passed - AST in normal range and within 360 days    AST  Date Value Ref Range Status  12/15/2022 27 0 - 40 IU/L Final         Passed - ALT in normal range and within 360 days    ALT  Date Value Ref Range Status  12/15/2022 18 0 - 44 IU/L Final         Passed - eGFR is 30 or above and within 360 days    GFR calc Af Amer  Date Value Ref Range Status  11/24/2020 101 >59 mL/min/1.73 Final    Comment:    **In accordance with recommendations from the NKF-ASN Task force,**   [facility] is in the process of updating its eGFR calculation to the   2021 CKD-EPI creatinine equation that estimates kidney function   without a race variable.    GFR, Estimated  Date Value Ref Range Status  06/01/2021 >60 >60 mL/min Final    Comment:    (NOTE) Calculated using the CKD-EPI Creatinine Equation (2021)    eGFR  Date Value Ref Range Status  12/15/2022 76 >59 mL/min/1.73 Final         Passed - Patient is not pregnant      Passed - Valid encounter within last 12 months    Recent Outpatient Visits           7 months ago Paroxysmal atrial  fibrillation (HCC)   [place] [facility] [place], Olivarez T, NP   9 months ago Paroxysmal atrial fibrillation (HCC)   [place] [facility] [place], Corrie Dandy T, NP   1 year ago Paroxysmal atrial fibrillation [facility])   [place] [facility] [place], Corrie Dandy T, NP   1 year ago Environmental and seasonal allergies    [facility] [place], Corrie Dandy T, NP   1 year ago Environmental and seasonal allergies    [facility] [place], Corrie Dandy T, NP       Future Appointments             Tomorrow Laurier Nancy, MD [facility]   In 6 months Deirdre Evener, MD [facility]

## 2023-08-07 ENCOUNTER — Encounter: Payer: Self-pay | Admitting: Cardiovascular Disease

## 2023-08-07 ENCOUNTER — Ambulatory Visit (INDEPENDENT_AMBULATORY_CARE_PROVIDER_SITE_OTHER): Payer: Medicare PPO | Admitting: Cardiovascular Disease

## 2023-08-07 VITALS — BP 120/85 | HR 75 | Ht 69.0 in | Wt 255.0 lb

## 2023-08-07 DIAGNOSIS — E782 Mixed hyperlipidemia: Secondary | ICD-10-CM | POA: Diagnosis not present

## 2023-08-07 DIAGNOSIS — I10 Essential (primary) hypertension: Secondary | ICD-10-CM | POA: Diagnosis not present

## 2023-08-07 DIAGNOSIS — I480 Paroxysmal atrial fibrillation: Secondary | ICD-10-CM | POA: Diagnosis not present

## 2023-08-07 DIAGNOSIS — I340 Nonrheumatic mitral (valve) insufficiency: Secondary | ICD-10-CM | POA: Diagnosis not present

## 2023-08-07 DIAGNOSIS — G4733 Obstructive sleep apnea (adult) (pediatric): Secondary | ICD-10-CM

## 2023-08-07 DIAGNOSIS — K2270 Barrett's esophagus without dysplasia: Secondary | ICD-10-CM

## 2023-08-07 NOTE — Patient Instructions (Signed)
May stop xarelto 3 days prior to endoscopy and resume 1-2 days later if no bleeding

## 2023-08-07 NOTE — Progress Notes (Signed)
Cardiology Office Note   Date:  08/07/2023   ID:  Grant Diaz, DOB [birth year], MRN [id number]  PCP:  Marjie Skiff, NP  Cardiologist:  Adrian Blackwater, MD      History of Present Illness: Grant Diaz is a 72 y.o. male who presents for  Chief Complaint  Patient presents with   Follow-up    3 mo f/u    PREOPERATIVE CLEARANCE EGD, CAN STOP XARELTO. FEELING FINE      Past Medical History:  Diagnosis Date   Arthritis    Atrial fibrillation (HCC)    Barrett's esophagus    Cancer (HCC) 2008   Prostate-radiation   Cataract 2019   not yet significant enough to warrant surgery   Dysrhythmia    Afib   GERD (gastroesophageal reflux disease)    History of DVT (deep vein thrombosis)    History of hiatal hernia    History of kidney stones    Hypertension    Joint pain    Mixed hyperlipidemia    Pancreatitis 2013   [facility]   Prostate cancer [facility])    Pulmonary embolus (HCC) 12/2014   Bilateral pulmonary embolus, [facility]   Sleep apnea 2010   use CPAP regularly   Sleep apnea, obstructive    cpap     Past Surgical History:  Procedure Laterality Date   BIOPSY  06/12/2023   Procedure: BIOPSY;  Surgeon: [name], MD;  Location: [facility] ENDOSCOPY;  Service: Gastroenterology;;   Fidela Salisbury RELEASE Right 03/15/2017   Procedure: CARPAL TUNNEL RELEASE;  Surgeon: Kennedy Bucker, MD;  Location: [facility] ORS;  Service: Orthopedics;  Laterality: Right;   CHOLECYSTECTOMY     COLONOSCOPY WITH PROPOFOL N/A 04/02/2018   Procedure: COLONOSCOPY WITH PROPOFOL;  Surgeon: Midge Minium, MD;  Location: [facility] ENDOSCOPY;  Service: Endoscopy;  Laterality: N/A;   COLONOSCOPY WITH PROPOFOL N/A 06/12/2023   Procedure: COLONOSCOPY WITH PROPOFOL;  Surgeon: [name], MD;  Location: [facility] ENDOSCOPY;  Service: Gastroenterology;  Laterality: N/A;   ESOPHAGOGASTRODUODENOSCOPY N/A 05/28/2013   Procedure: ESOPHAGOGASTRODUODENOSCOPY (EGD);  Surgeon: Kandis Cocking, MD;  Location: [facility] ENDOSCOPY;  Service:  General;  Laterality: N/A;   ESOPHAGOGASTRODUODENOSCOPY (EGD) WITH PROPOFOL N/A 02/08/2021   Procedure: ESOPHAGOGASTRODUODENOSCOPY (EGD) WITH PROPOFOL;  Surgeon: Pasty Spillers, MD;  Location: [facility] ENDOSCOPY;  Service: Endoscopy;  Laterality: N/A;   ESOPHAGOGASTRODUODENOSCOPY (EGD) WITH PROPOFOL N/A 06/12/2023   Procedure: ESOPHAGOGASTRODUODENOSCOPY (EGD) WITH PROPOFOL;  Surgeon: [name], MD;  Location: [facility] ENDOSCOPY;  Service: Gastroenterology;  Laterality: N/A;   GALLBLADDER SURGERY  2003   GANGLION CYST EXCISION Right 03/15/2017   Procedure: REMOVAL GANGLION OF WRIST;  Surgeon: Kennedy Bucker, MD;  Location: [facility] ORS;  Service: Orthopedics;  Laterality: Right;   HERNIA REPAIR     HIATAL HERNIA REPAIR N/A 05/31/2021   Procedure: LAPAROSCOPIC REPAIR OF HIATAL HERNIA WITH LAP BAND REMOVAL;  Surgeon: Kinsinger, De Blanch, MD;  Location: [facility] ORS;  Service: General;  Laterality: N/A;   JOINT REPLACEMENT     KNEE ARTHROSCOPY Bilateral [id number]   LAPAROSCOPIC GASTRIC BANDING  2009   and hernia repair   LAPAROSCOPIC REPAIR OF HIATAL HERNIA WITH LAP BAND REMOVAL   05/31/2021   POLYPECTOMY  06/12/2023   Procedure: POLYPECTOMY;  Surgeon: [name], MD;  Location: [facility] ENDOSCOPY;  Service: Gastroenterology;;   RADIATION IMPLANT W/ ULTRASOUND, MALE  2005   internal/external radiation for prostate cancer   TOTAL KNEE ARTHROPLASTY Left 10/12/2015   Procedure: TOTAL KNEE ARTHROPLASTY;  Surgeon: Kennedy Bucker,  MD;  Location: [facility] ORS;  Service: Orthopedics;  Laterality: Left;   TOTAL KNEE ARTHROPLASTY Right 05/15/2017   Procedure: TOTAL KNEE ARTHROPLASTY;  Surgeon: Kennedy Bucker, MD;  Location: [facility] ORS;  Service: Orthopedics;  Laterality: Right;   UMBILICAL HERNIA REPAIR  2010     Current Outpatient Medications  Medication Sig Dispense Refill   acetaminophen (TYLENOL) 325 MG tablet Take 650 mg by mouth every 6 (six) hours as needed for mild pain.     albuterol (VENTOLIN HFA) 108 (90 Base)  MCG/ACT inhaler Inhale 2 puffs into the lungs every 6 (six) hours as needed for wheezing or shortness of breath. 18 g 0   amLODipine (NORVASC) 10 MG tablet Take 1 tablet (10 mg total) by mouth daily. 90 tablet 4   celecoxib (CELEBREX) 100 MG capsule TAKE 1 CAPSULE BY MOUTH ONCE DAILY AS NEEDED USE MINIMALLY SINCE TAKING XARELTO 90 capsule 0   Cholecalciferol 25 MCG (1000 UT) capsule Take 1,000 Units by mouth daily.     esomeprazole (NEXIUM) 40 MG capsule Take 1 capsule (40 mg total) by mouth daily at 12 noon. 60 capsule 3   ketoconazole (NIZORAL) 2 % cream Qhs to aa rash groin prn flares also use to both feet nightly for tinea pedis 60 g 11   lisinopril-hydrochlorothiazide (ZESTORETIC) 20-25 MG tablet Take 1 tablet by mouth daily. 90 tablet 4   Multiple Vitamin (MULTIVITAMIN) capsule Take 1 capsule by mouth daily.     omeprazole (PRILOSEC) 20 MG capsule Take 1 capsule (20 mg total) by mouth 2 (two) times daily before a meal. 180 capsule 3   omeprazole (PRILOSEC) 40 MG capsule Take 1 capsule (40 mg total) by mouth in the morning and at bedtime. 60 capsule 3   rosuvastatin (CRESTOR) 10 MG tablet Take one tablet (10 MG) by mouth three times a week. 36 tablet 4   triamcinolone cream (KENALOG) 0.1 % Apply 1 Application topically as directed. Qd up to 5 days per week to aa eczema on body until clear, avoid face, groin, axilla 80 g 6   XARELTO 20 MG TABS tablet Take 1 tablet (20 mg total) by mouth daily. 30 tablet 3   No current facility-administered medications for this visit.    Allergies:   Adhesive [tape]    Social History:   reports that he has never smoked. He has never used smokeless tobacco. He reports that he does not currently use alcohol. He reports that he does not use drugs.   Family History:  family history includes Arthritis in his brother and mother; Asthma in his brother; Diabetes in his mother and son; Heart disease in his maternal grandmother and paternal grandmother; Hypertension  in his brother; Obesity in his mother; Parkinson's disease in his father; Stroke in his maternal grandfather and paternal grandfather.    ROS:     Review of Systems  Constitutional: Negative.   HENT: Negative.    Eyes: Negative.   Respiratory: Negative.    Gastrointestinal: Negative.   Genitourinary: Negative.   Musculoskeletal: Negative.   Skin: Negative.   Neurological: Negative.   Endo/Heme/Allergies: Negative.   Psychiatric/Behavioral: Negative.    All other systems reviewed and are negative.     All other systems are reviewed and negative.    PHYSICAL EXAM: VS:  BP 120/85   Pulse 75   Ht 5\' 9"  (1.753 m)   Wt 255 lb (115.7 kg)   SpO2 94%   BMI 37.66 kg/m  , BMI Body mass index  is 37.66 kg/m. Last weight:  Wt Readings from Last 3 Encounters:  08/07/23 255 lb (115.7 kg)  06/12/23 249 lb 9.6 oz (113.2 kg)  05/08/23 259 lb 9.6 oz (117.8 kg)     Physical Exam Vitals reviewed.  Constitutional:      Appearance: Normal appearance. He is normal weight.  HENT:     Head: Normocephalic.     Nose: Nose normal.     Mouth/Throat:     Mouth: Mucous membranes are moist.  Eyes:     Pupils: Pupils are equal, round, and reactive to light.  Cardiovascular:     Rate and Rhythm: Normal rate and regular rhythm.     Pulses: Normal pulses.     Heart sounds: Normal heart sounds.  Pulmonary:     Effort: Pulmonary effort is normal.  Abdominal:     General: Abdomen is flat. Bowel sounds are normal.  Musculoskeletal:        General: Normal range of motion.     Cervical back: Normal range of motion.  Skin:    General: Skin is warm.  Neurological:     General: No focal deficit present.     Mental Status: He is alert.  Psychiatric:        Mood and Affect: Mood normal.       EKG:   Recent Labs: 12/15/2022: ALT 18; BUN 18; Creatinine, Ser 1.04; Hemoglobin 13.6; Magnesium 1.9; Platelets 183; Potassium 4.0; Sodium 141; TSH 1.160    Lipid Panel    Component Value  Date/Time   CHOL 124 12/15/2022 1029   TRIG 55 12/15/2022 1029   HDL 50 12/15/2022 1029   LDLCALC 62 12/15/2022 1029      Other studies Reviewed: Additional studies/ records that were reviewed today include:  Review of the above records demonstrates:      01/21/2018    4:36 PM  PAD Screen  Previous PAD dx? No  Previous surgical procedure? No  Pain with walking? No  Feet/toe relief with dangling? No  Painful, non-healing ulcers? No  Extremities discolored? No      ASSESSMENT AND PLAN:    ICD-10-CM   1. Essential hypertension, benign  I10     2. Paroxysmal atrial fibrillation (HCC)  I48.0     3. Barrett's esophagus without dysplasia  K22.70    NEEDS EGD, AND CAN STOP XARELTO 3 DAYS PRIOR AND RESUME NEXT DAY IF NO BLEEDING    4. Obstructive sleep apnea syndrome  G47.33     5. Mixed hyperlipidemia  E78.2     6. Nonrheumatic mitral valve regurgitation  I34.0        Problem List Items Addressed This Visit       Cardiovascular and Mediastinum   Essential hypertension, benign - Primary   Atrial fibrillation [facility])     Respiratory   Sleep apnea     Digestive   Barrett esophagus     Other   Mixed hyperlipidemia   Other Visit Diagnoses     Nonrheumatic mitral valve regurgitation              Disposition:   Return in about 3 months (around 11/07/2023).    Total time spent: 30 minutes  Signed,  Adrian Blackwater, MD  08/07/2023 10:57 AM    [facility]

## 2023-09-25 ENCOUNTER — Other Ambulatory Visit: Payer: Self-pay | Admitting: Cardiovascular Disease

## 2023-09-25 DIAGNOSIS — E782 Mixed hyperlipidemia: Secondary | ICD-10-CM

## 2023-09-25 DIAGNOSIS — I10 Essential (primary) hypertension: Secondary | ICD-10-CM

## 2023-09-25 DIAGNOSIS — I340 Nonrheumatic mitral (valve) insufficiency: Secondary | ICD-10-CM

## 2023-09-25 DIAGNOSIS — I480 Paroxysmal atrial fibrillation: Secondary | ICD-10-CM

## 2023-09-25 DIAGNOSIS — G4733 Obstructive sleep apnea (adult) (pediatric): Secondary | ICD-10-CM

## 2023-09-26 DIAGNOSIS — I10 Essential (primary) hypertension: Secondary | ICD-10-CM | POA: Diagnosis not present

## 2023-09-26 DIAGNOSIS — K31A19 Gastric intestinal metaplasia without dysplasia, unspecified site: Secondary | ICD-10-CM | POA: Diagnosis not present

## 2023-09-26 DIAGNOSIS — Z7982 Long term (current) use of aspirin: Secondary | ICD-10-CM | POA: Diagnosis not present

## 2023-09-26 DIAGNOSIS — Z79899 Other long term (current) drug therapy: Secondary | ICD-10-CM | POA: Diagnosis not present

## 2023-09-26 DIAGNOSIS — K449 Diaphragmatic hernia without obstruction or gangrene: Secondary | ICD-10-CM | POA: Diagnosis not present

## 2023-09-26 DIAGNOSIS — K22710 Barrett's esophagus with low grade dysplasia: Secondary | ICD-10-CM | POA: Diagnosis not present

## 2023-09-26 DIAGNOSIS — K2270 Barrett's esophagus without dysplasia: Secondary | ICD-10-CM | POA: Diagnosis not present

## 2023-10-09 ENCOUNTER — Ambulatory Visit: Payer: Medicare PPO | Admitting: Nurse Practitioner

## 2023-10-09 DIAGNOSIS — K2270 Barrett's esophagus without dysplasia: Secondary | ICD-10-CM

## 2023-10-09 DIAGNOSIS — D692 Other nonthrombocytopenic purpura: Secondary | ICD-10-CM

## 2023-10-09 DIAGNOSIS — I10 Essential (primary) hypertension: Secondary | ICD-10-CM

## 2023-10-09 DIAGNOSIS — D696 Thrombocytopenia, unspecified: Secondary | ICD-10-CM

## 2023-10-09 DIAGNOSIS — D6869 Other thrombophilia: Secondary | ICD-10-CM

## 2023-10-09 DIAGNOSIS — E782 Mixed hyperlipidemia: Secondary | ICD-10-CM

## 2023-10-09 DIAGNOSIS — I480 Paroxysmal atrial fibrillation: Secondary | ICD-10-CM

## 2023-10-09 DIAGNOSIS — G4733 Obstructive sleep apnea (adult) (pediatric): Secondary | ICD-10-CM

## 2023-10-10 ENCOUNTER — Other Ambulatory Visit: Payer: Self-pay | Admitting: Gastroenterology

## 2023-10-22 ENCOUNTER — Other Ambulatory Visit: Payer: Self-pay | Admitting: Gastroenterology

## 2023-10-25 ENCOUNTER — Other Ambulatory Visit: Payer: Self-pay | Admitting: Gastroenterology

## 2023-11-01 ENCOUNTER — Other Ambulatory Visit: Payer: Self-pay | Admitting: Nurse Practitioner

## 2023-11-05 NOTE — Telephone Encounter (Signed)
 Requested Prescriptions  Pending Prescriptions Disp Refills   celecoxib  (CELEBREX ) 100 MG capsule [Pharmacy Med Name: Celecoxib  100 MG Oral Capsule] 90 capsule 0    Sig: TAKE 1 CAPSULE BY MOUTH ONCE DAILY AS NEEDED. USE MINIMALLY SINCE TAKING XARELTO      Analgesics:  COX2 Inhibitors Failed - 11/05/2023 11:30 AM      Failed - Manual Review: Labs are only required if the patient has taken medication for more than 8 weeks.      Passed - HGB in normal range and within 360 days    Hemoglobin  Date Value Ref Range Status  12/15/2022 13.6 13.0 - 17.7 g/dL Final         Passed - Cr in normal range and within 360 days    Creatinine, Ser  Date Value Ref Range Status  12/15/2022 1.04 0.76 - 1.27 mg/dL Final         Passed - HCT in normal range and within 360 days    Hematocrit  Date Value Ref Range Status  12/15/2022 40.5 37.5 - 51.0 % Final         Passed - AST in normal range and within 360 days    AST  Date Value Ref Range Status  12/15/2022 27 0 - 40 IU/L Final         Passed - ALT in normal range and within 360 days    ALT  Date Value Ref Range Status  12/15/2022 18 0 - 44 IU/L Final         Passed - eGFR is 30 or above and within 360 days    GFR calc Af Amer  Date Value Ref Range Status  11/24/2020 101 >59 mL/min/1.73 Final    Comment:    **In accordance with recommendations from the NKF-ASN Task force,**   [facility] is in the process of updating its eGFR calculation to the   2021 CKD-EPI creatinine equation that estimates kidney function   without a race variable.    GFR, Estimated  Date Value Ref Range Status  06/01/2021 >60 >60 mL/min Final    Comment:    (NOTE) Calculated using the CKD-EPI Creatinine Equation (2021)    eGFR  Date Value Ref Range Status  12/15/2022 76 >59 mL/min/1.73 Final         Passed - Patient is not pregnant      Passed - Valid encounter within last 12 months    Recent Outpatient Visits           10 months ago Paroxysmal atrial  fibrillation (HCC)   [place] [facility] [place], Shelby T, NP   12 months ago Paroxysmal atrial fibrillation (HCC)   [place] [facility] [place], Melanie T, NP   1 year ago Paroxysmal atrial fibrillation [facility])   [place] [facility] [place], Melanie T, NP   1 year ago Environmental and seasonal allergies   [place] [facility] [place], Jolene T, NP   1 year ago Environmental and seasonal allergies   [place] [facility] [place], Melanie T, NP       Future Appointments             In 3 days Fernand Denyse LABOR, MD [facility]   In 2 weeks [place], Melanie DASEN, NP [place] [facility]   In 3 months Hester Alm BROCKS, MD [facility]

## 2023-11-08 ENCOUNTER — Encounter: Payer: Self-pay | Admitting: Cardiovascular Disease

## 2023-11-08 ENCOUNTER — Ambulatory Visit: Payer: Medicare PPO | Admitting: Cardiovascular Disease

## 2023-11-08 VITALS — BP 112/80 | HR 70 | Ht 69.0 in | Wt 255.6 lb

## 2023-11-08 DIAGNOSIS — E782 Mixed hyperlipidemia: Secondary | ICD-10-CM

## 2023-11-08 DIAGNOSIS — I480 Paroxysmal atrial fibrillation: Secondary | ICD-10-CM

## 2023-11-08 DIAGNOSIS — I10 Essential (primary) hypertension: Secondary | ICD-10-CM | POA: Diagnosis not present

## 2023-11-08 NOTE — Progress Notes (Signed)
Cardiology Office Note   Date:  11/08/2023   ID:  [name], DOB 1951/03/02, MRN [id number]  PCP:  Marjie Skiff, NP  Cardiologist:  Adrian Blackwater, MD      History of Present Illness: Grant Diaz is a 73 y.o. male who presents for  Chief Complaint  Patient presents with   Follow-up    3 month    Has been feeling fine      Past Medical History:  Diagnosis Date   Arthritis    Atrial fibrillation (HCC)    Barrett's esophagus    Cancer (HCC) 2008   Prostate-radiation   Cataract 2019   not yet significant enough to warrant surgery   Dysrhythmia    Afib   GERD (gastroesophageal reflux disease)    History of DVT (deep vein thrombosis)    History of hiatal hernia    History of kidney stones    Hypertension    Joint pain    Mixed hyperlipidemia    Pancreatitis 2013   [facility]   Prostate cancer [facility])    Pulmonary embolus (HCC) 12/2014   Bilateral pulmonary embolus, [facility]   Sleep apnea 2010   use CPAP regularly   Sleep apnea, obstructive    cpap     Past Surgical History:  Procedure Laterality Date   BIOPSY  06/12/2023   Procedure: BIOPSY;  Surgeon: [name], MD;  Location: [facility] ENDOSCOPY;  Service: Gastroenterology;;   Fidela Salisbury RELEASE Right 03/15/2017   Procedure: CARPAL TUNNEL RELEASE;  Surgeon: Kennedy Bucker, MD;  Location: [facility] ORS;  Service: Orthopedics;  Laterality: Right;   CHOLECYSTECTOMY     COLONOSCOPY WITH PROPOFOL N/A 04/02/2018   Procedure: COLONOSCOPY WITH PROPOFOL;  Surgeon: Midge Minium, MD;  Location: [facility] ENDOSCOPY;  Service: Endoscopy;  Laterality: N/A;   COLONOSCOPY WITH PROPOFOL N/A 06/12/2023   Procedure: COLONOSCOPY WITH PROPOFOL;  Surgeon: [name], MD;  Location: [facility] ENDOSCOPY;  Service: Gastroenterology;  Laterality: N/A;   ESOPHAGOGASTRODUODENOSCOPY N/A 05/28/2013   Procedure: ESOPHAGOGASTRODUODENOSCOPY (EGD);  Surgeon: Kandis Cocking, MD;  Location: [facility] ENDOSCOPY;  Service: General;  Laterality: N/A;    ESOPHAGOGASTRODUODENOSCOPY (EGD) WITH PROPOFOL N/A 02/08/2021   Procedure: ESOPHAGOGASTRODUODENOSCOPY (EGD) WITH PROPOFOL;  Surgeon: Pasty Spillers, MD;  Location: [facility] ENDOSCOPY;  Service: Endoscopy;  Laterality: N/A;   ESOPHAGOGASTRODUODENOSCOPY (EGD) WITH PROPOFOL N/A 06/12/2023   Procedure: ESOPHAGOGASTRODUODENOSCOPY (EGD) WITH PROPOFOL;  Surgeon: [name], MD;  Location: [facility] ENDOSCOPY;  Service: Gastroenterology;  Laterality: N/A;   GALLBLADDER SURGERY  2003   GANGLION CYST EXCISION Right 03/15/2017   Procedure: REMOVAL GANGLION OF WRIST;  Surgeon: Kennedy Bucker, MD;  Location: [facility] ORS;  Service: Orthopedics;  Laterality: Right;   HERNIA REPAIR     HIATAL HERNIA REPAIR N/A 05/31/2021   Procedure: LAPAROSCOPIC REPAIR OF HIATAL HERNIA WITH LAP BAND REMOVAL;  Surgeon: Kinsinger, De Blanch, MD;  Location: [facility] ORS;  Service: General;  Laterality: N/A;   JOINT REPLACEMENT     KNEE ARTHROSCOPY Bilateral [id number]   LAPAROSCOPIC GASTRIC BANDING  2009   and hernia repair   LAPAROSCOPIC REPAIR OF HIATAL HERNIA WITH LAP BAND REMOVAL   05/31/2021   POLYPECTOMY  06/12/2023   Procedure: POLYPECTOMY;  Surgeon: [name], MD;  Location: [facility] ENDOSCOPY;  Service: Gastroenterology;;   RADIATION IMPLANT W/ ULTRASOUND, MALE  2005   internal/external radiation for prostate cancer   TOTAL KNEE ARTHROPLASTY Left 10/12/2015   Procedure: TOTAL KNEE ARTHROPLASTY;  Surgeon: Kennedy Bucker, MD;  Location: [facility] ORS;  Service: Orthopedics;  Laterality: Left;   TOTAL KNEE ARTHROPLASTY Right 05/15/2017   Procedure: TOTAL KNEE ARTHROPLASTY;  Surgeon: Kennedy Bucker, MD;  Location: [facility] ORS;  Service: Orthopedics;  Laterality: Right;   UMBILICAL HERNIA REPAIR  2010     Current Outpatient Medications  Medication Sig Dispense Refill   acetaminophen (TYLENOL) 325 MG tablet Take 650 mg by mouth every 6 (six) hours as needed for mild pain.     albuterol (VENTOLIN HFA) 108 (90 Base) MCG/ACT inhaler Inhale 2 puffs  into the lungs every 6 (six) hours as needed for wheezing or shortness of breath. 18 g 0   amLODipine (NORVASC) 10 MG tablet Take 1 tablet (10 mg total) by mouth daily. 90 tablet 4   celecoxib (CELEBREX) 100 MG capsule TAKE 1 CAPSULE BY MOUTH ONCE DAILY AS NEEDED. USE MINIMALLY SINCE TAKING XARELTO 90 capsule 0   Cholecalciferol 25 MCG (1000 UT) capsule Take 1,000 Units by mouth daily.     esomeprazole (NEXIUM) 40 MG capsule Take 1 capsule (40 mg total) by mouth daily at 12 noon. 60 capsule 3   ketoconazole (NIZORAL) 2 % cream Qhs to aa rash groin prn flares also use to both feet nightly for tinea pedis 60 g 11   lisinopril-hydrochlorothiazide (ZESTORETIC) 20-25 MG tablet Take 1 tablet by mouth daily. 90 tablet 4   Multiple Vitamin (MULTIVITAMIN) capsule Take 1 capsule by mouth daily.     omeprazole (PRILOSEC) 20 MG capsule Take 1 capsule (20 mg total) by mouth 2 (two) times daily before a meal. 180 capsule 3   omeprazole (PRILOSEC) 40 MG capsule Take 1 capsule (40 mg total) by mouth 2 (two) times daily. 60 capsule 11   rosuvastatin (CRESTOR) 10 MG tablet Take one tablet (10 MG) by mouth three times a week. 36 tablet 4   triamcinolone cream (KENALOG) 0.1 % Apply 1 Application topically as directed. Qd up to 5 days per week to aa eczema on body until clear, avoid face, groin, axilla 80 g 6   XARELTO 20 MG TABS tablet Take 1 tablet by mouth once daily 30 tablet 0   No current facility-administered medications for this visit.    Allergies:   Adhesive [tape]    Social History:   reports that he has never smoked. He has never used smokeless tobacco. He reports that he does not currently use alcohol. He reports that he does not use drugs.   Family History:  family history includes Arthritis in his brother and mother; Asthma in his brother; Diabetes in his mother and son; Heart disease in his maternal grandmother and paternal grandmother; Hypertension in his brother; Obesity in his mother; Parkinson's  disease in his father; Stroke in his maternal grandfather and paternal grandfather.    ROS:     Review of Systems  Constitutional: Negative.   HENT: Negative.    Eyes: Negative.   Respiratory: Negative.    Gastrointestinal: Negative.   Genitourinary: Negative.   Musculoskeletal: Negative.   Skin: Negative.   Neurological: Negative.   Endo/Heme/Allergies: Negative.   Psychiatric/Behavioral: Negative.    All other systems reviewed and are negative.     All other systems are reviewed and negative.    PHYSICAL EXAM: VS:  BP 112/80   Pulse 70   Ht 5\' 9"  (1.753 m)   Wt 255 lb 9.6 oz (115.9 kg)   SpO2 99%   BMI 37.75 kg/m  , BMI Body mass index is 37.75 kg/m. Last weight:  Wt Readings  from Last 3 Encounters:  11/08/23 255 lb 9.6 oz (115.9 kg)  08/07/23 255 lb (115.7 kg)  06/12/23 249 lb 9.6 oz (113.2 kg)     Physical Exam Vitals reviewed.  Constitutional:      Appearance: Normal appearance. He is normal weight.  HENT:     Head: Normocephalic.     Nose: Nose normal.     Mouth/Throat:     Mouth: Mucous membranes are moist.  Eyes:     Pupils: Pupils are equal, round, and reactive to light.  Cardiovascular:     Rate and Rhythm: Normal rate and regular rhythm.     Pulses: Normal pulses.     Heart sounds: Normal heart sounds.  Pulmonary:     Effort: Pulmonary effort is normal.  Abdominal:     General: Abdomen is flat. Bowel sounds are normal.  Musculoskeletal:        General: Normal range of motion.     Cervical back: Normal range of motion.  Skin:    General: Skin is warm.  Neurological:     General: No focal deficit present.     Mental Status: He is alert.  Psychiatric:        Mood and Affect: Mood normal.       EKG:   Recent Labs: 12/15/2022: ALT 18; BUN 18; Creatinine, Ser 1.04; Hemoglobin 13.6; Magnesium 1.9; Platelets 183; Potassium 4.0; Sodium 141; TSH 1.160    Lipid Panel    Component Value Date/Time   CHOL 124 12/15/2022 1029   TRIG 55  12/15/2022 1029   HDL 50 12/15/2022 1029   LDLCALC 62 12/15/2022 1029      Other studies Reviewed: Additional studies/ records that were reviewed today include:  Review of the above records demonstrates:      01/21/2018    4:36 PM  PAD Screen  Previous PAD dx? No  Previous surgical procedure? No  Pain with walking? No  Feet/toe relief with dangling? No  Painful, non-healing ulcers? No  Extremities discolored? No      ASSESSMENT AND PLAN:    ICD-10-CM   1. Essential hypertension, benign  I10    repeat BP 130/80    2. Paroxysmal atrial fibrillation (HCC)  I48.0    In NSR, getting stroke prevention, but not on antarrythmic    3. Mixed hyperlipidemia  E78.2        Problem List Items Addressed This Visit       Cardiovascular and Mediastinum   Essential hypertension, benign - Primary   Atrial fibrillation (HCC)     Other   Mixed hyperlipidemia       Disposition:   Return in about 3 months (around 02/06/2024).    Total time spent: 30 minutes  Signed,  Adrian Blackwater, MD  11/08/2023 11:19 AM    [facility]

## 2023-11-12 ENCOUNTER — Other Ambulatory Visit: Payer: Self-pay | Admitting: Cardiovascular Disease

## 2023-11-12 DIAGNOSIS — I340 Nonrheumatic mitral (valve) insufficiency: Secondary | ICD-10-CM

## 2023-11-12 DIAGNOSIS — E782 Mixed hyperlipidemia: Secondary | ICD-10-CM

## 2023-11-12 DIAGNOSIS — G4733 Obstructive sleep apnea (adult) (pediatric): Secondary | ICD-10-CM

## 2023-11-12 DIAGNOSIS — I10 Essential (primary) hypertension: Secondary | ICD-10-CM

## 2023-11-12 DIAGNOSIS — I480 Paroxysmal atrial fibrillation: Secondary | ICD-10-CM

## 2023-11-17 NOTE — Patient Instructions (Incomplete)
Be Involved in Caring For Your Health:  Taking Medications When medications are taken as directed, they can greatly improve your health. But if they are not taken as prescribed, they may not work. In some cases, not taking them correctly can be harmful. To help ensure your treatment remains effective and safe, understand your medications and how to take them. Bring your medications to each visit for review by your provider.  Your lab results, notes, and after visit summary will be available on My Chart. We strongly encourage you to use this feature. If lab results are abnormal the clinic will contact you with the appropriate steps. If the clinic does not contact you assume the results are satisfactory. You can always view your results on My Chart. If you have questions regarding your health or results, please contact the clinic during office hours. You can also ask questions on My Chart.  We at [facility] are grateful that you chose [place] to provide your care. We strive to provide evidence-based and compassionate care and are always looking for feedback. If you get a survey from the clinic please complete this so we can hear your opinions.  Heart-Healthy Eating Plan Many factors influence your heart health, including eating and exercise habits. Heart health is also called coronary health. Coronary risk increases with abnormal blood fat (lipid) levels. A heart-healthy eating plan includes limiting unhealthy fats, increasing healthy fats, limiting salt (sodium) intake, and making other diet and lifestyle changes. What is my plan? Your health care provider may recommend that: You limit your fat intake to _________% or less of your total calories each day. You limit your saturated fat intake to _________% or less of your total calories each day. You limit the amount of cholesterol in your diet to less than _________ mg per day. You limit the amount of sodium in your diet to less than _________  mg per day. What are tips for following this plan? Cooking Cook foods using methods other than frying. Baking, boiling, grilling, and broiling are all good options. Other ways to reduce fat include: Removing the skin from poultry. Removing all visible fats from meats. Steaming vegetables in water or broth. Meal planning  At meals, imagine dividing your plate into fourths: Fill one-half of your plate with vegetables and green salads. Fill one-fourth of your plate with whole grains. Fill one-fourth of your plate with lean protein foods. Eat 2-4 cups of vegetables per day. One cup of vegetables equals 1 cup (91 g) broccoli or cauliflower florets, 2 medium carrots, 1 large bell pepper, 1 large sweet potato, 1 large tomato, 1 medium white potato, 2 cups (150 g) raw leafy greens. Eat 1-2 cups of fruit per day. One cup of fruit equals 1 small apple, 1 large banana, 1 cup (237 g) mixed fruit, 1 large orange,  cup (82 g) dried fruit, 1 cup (240 mL) 100% fruit juice. Eat more foods that contain soluble fiber. Examples include apples, broccoli, carrots, beans, peas, and barley. Aim to get 25-30 g of fiber per day. Increase your consumption of legumes, nuts, and seeds to 4-5 servings per week. One serving of dried beans or legumes equals  cup (90 g) cooked, 1 serving of nuts is  oz (12 almonds, 24 pistachios, or 7 walnut halves), and 1 serving of seeds equals  oz (8 g). Fats Choose healthy fats more often. Choose monounsaturated and polyunsaturated fats, such as olive and canola oils, avocado oil, flaxseeds, walnuts, almonds, and seeds. Eat  more omega-3 fats. Choose salmon, mackerel, sardines, tuna, flaxseed oil, and ground flaxseeds. Aim to eat fish at least 2 times each week. Check food labels carefully to identify foods with trans fats or high amounts of saturated fat. Limit saturated fats. These are found in animal products, such as meats, butter, and cream. Plant sources of saturated fats  include palm oil, palm kernel oil, and coconut oil. Avoid foods with partially hydrogenated oils in them. These contain trans fats. Examples are stick margarine, some tub margarines, cookies, crackers, and other baked goods. Avoid fried foods. General information Eat more home-cooked food and less restaurant, buffet, and fast food. Limit or avoid alcohol. Limit foods that are high in added sugar and simple starches such as foods made using white refined flour (white breads, pastries, sweets). Lose weight if you are overweight. Losing just 5-10% of your body weight can help your overall health and prevent diseases such as diabetes and heart disease. Monitor your sodium intake, especially if you have high blood pressure. Talk with your health care provider about your sodium intake. Try to incorporate more vegetarian meals weekly. What foods should I eat? Fruits All fresh, canned (in natural juice), or frozen fruits. Vegetables Fresh or frozen vegetables (raw, steamed, roasted, or grilled). Green salads. Grains Most grains. Choose whole wheat and whole grains most of the time. Rice and pasta, including brown rice and pastas made with whole wheat. Meats and other proteins Lean, well-trimmed beef, veal, pork, and lamb. Chicken and [place] without skin. All fish and shellfish. Wild duck, rabbit, pheasant, and venison. Egg whites or low-cholesterol egg substitutes. Dried beans, peas, lentils, and tofu. Seeds and most nuts. Dairy Low-fat or nonfat cheeses, including ricotta and mozzarella. Skim or 1% milk (liquid, powdered, or evaporated). Buttermilk made with low-fat milk. Nonfat or low-fat yogurt. Fats and oils Non-hydrogenated (trans-free) margarines. Vegetable oils, including soybean, sesame, sunflower, olive, avocado, peanut, safflower, corn, canola, and cottonseed. Salad dressings or mayonnaise made with a vegetable oil. Beverages Water (mineral or sparkling). Coffee and tea. Unsweetened ice  tea. Diet beverages. Sweets and desserts Sherbet, gelatin, and fruit ice. Small amounts of dark chocolate. Limit all sweets and desserts. Seasonings and condiments All seasonings and condiments. The items listed above may not be a complete list of foods and beverages you can eat. Contact a dietitian for more options. What foods should I avoid? Fruits Canned fruit in heavy syrup. Fruit in cream or butter sauce. Fried fruit. Limit coconut. Vegetables Vegetables cooked in cheese, cream, or butter sauce. Fried vegetables. Grains Breads made with saturated or trans fats, oils, or whole milk. Croissants. Sweet rolls. Donuts. High-fat crackers, such as cheese crackers and chips. Meats and other proteins Fatty meats, such as hot dogs, ribs, sausage, bacon, rib-eye roast or steak. High-fat deli meats, such as salami and bologna. Caviar. Domestic duck and goose. Organ meats, such as liver. Dairy Cream, sour cream, cream cheese, and creamed cottage cheese. Whole-milk cheeses. Whole or 2% milk (liquid, evaporated, or condensed). Whole buttermilk. Cream sauce or high-fat cheese sauce. Whole-milk yogurt. Fats and oils Meat fat, or shortening. Cocoa butter, hydrogenated oils, palm oil, coconut oil, palm kernel oil. Solid fats and shortenings, including bacon fat, salt pork, lard, and butter. Nondairy cream substitutes. Salad dressings with cheese or sour cream. Beverages Regular sodas and any drinks with added sugar. Sweets and desserts Frosting. Pudding. Cookies. Cakes. Pies. Milk chocolate or white chocolate. Buttered syrups. Full-fat ice cream or ice cream drinks. The items listed above may  not be a complete list of foods and beverages to avoid. Contact a dietitian for more information. Summary Heart-healthy meal planning includes limiting unhealthy fats, increasing healthy fats, limiting salt (sodium) intake and making other diet and lifestyle changes. Lose weight if you are overweight. Losing just  5-10% of your body weight can help your overall health and prevent diseases such as diabetes and heart disease. Focus on eating a balance of foods, including fruits and vegetables, low-fat or nonfat dairy, lean protein, nuts and legumes, whole grains, and heart-healthy oils and fats. This information is not intended to replace advice given to you by your health care provider. Make sure you discuss any questions you have with your health care provider. Document Revised: 11/14/2021 Document Reviewed: 11/14/2021 Elsevier Patient Education  2024 [employer].

## 2023-11-20 ENCOUNTER — Encounter: Payer: Self-pay | Admitting: Nurse Practitioner

## 2023-11-20 ENCOUNTER — Ambulatory Visit: Payer: Medicare PPO | Admitting: Nurse Practitioner

## 2023-11-20 DIAGNOSIS — D696 Thrombocytopenia, unspecified: Secondary | ICD-10-CM

## 2023-11-20 DIAGNOSIS — K2270 Barrett's esophagus without dysplasia: Secondary | ICD-10-CM

## 2023-11-20 DIAGNOSIS — Z8546 Personal history of malignant neoplasm of prostate: Secondary | ICD-10-CM

## 2023-11-20 DIAGNOSIS — I480 Paroxysmal atrial fibrillation: Secondary | ICD-10-CM

## 2023-11-20 DIAGNOSIS — G4733 Obstructive sleep apnea (adult) (pediatric): Secondary | ICD-10-CM

## 2023-11-20 DIAGNOSIS — I10 Essential (primary) hypertension: Secondary | ICD-10-CM

## 2023-11-20 DIAGNOSIS — D6869 Other thrombophilia: Secondary | ICD-10-CM

## 2023-11-20 DIAGNOSIS — D692 Other nonthrombocytopenic purpura: Secondary | ICD-10-CM

## 2023-11-20 DIAGNOSIS — E782 Mixed hyperlipidemia: Secondary | ICD-10-CM

## 2023-11-25 NOTE — Patient Instructions (Signed)

## 2023-11-27 ENCOUNTER — Encounter: Payer: Self-pay | Admitting: Nurse Practitioner

## 2023-11-27 ENCOUNTER — Ambulatory Visit: Payer: Self-pay | Admitting: Nurse Practitioner

## 2023-11-27 VITALS — BP 118/70 | HR 84 | Temp 97.4°F | Ht 69.0 in | Wt 256.6 lb

## 2023-11-27 DIAGNOSIS — D692 Other nonthrombocytopenic purpura: Secondary | ICD-10-CM | POA: Diagnosis not present

## 2023-11-27 DIAGNOSIS — D6869 Other thrombophilia: Secondary | ICD-10-CM | POA: Diagnosis not present

## 2023-11-27 DIAGNOSIS — Z8546 Personal history of malignant neoplasm of prostate: Secondary | ICD-10-CM

## 2023-11-27 DIAGNOSIS — D696 Thrombocytopenia, unspecified: Secondary | ICD-10-CM | POA: Diagnosis not present

## 2023-11-27 DIAGNOSIS — I480 Paroxysmal atrial fibrillation: Secondary | ICD-10-CM | POA: Diagnosis not present

## 2023-11-27 DIAGNOSIS — I10 Essential (primary) hypertension: Secondary | ICD-10-CM

## 2023-11-27 DIAGNOSIS — K2270 Barrett's esophagus without dysplasia: Secondary | ICD-10-CM

## 2023-11-27 DIAGNOSIS — E782 Mixed hyperlipidemia: Secondary | ICD-10-CM

## 2023-11-27 DIAGNOSIS — G4733 Obstructive sleep apnea (adult) (pediatric): Secondary | ICD-10-CM | POA: Diagnosis not present

## 2023-11-27 LAB — MICROALBUMIN, URINE WAIVED
Creatinine, Urine Waived: 200 mg/dL (ref 10–300)
Microalb, Ur Waived: 80 mg/L — ABNORMAL HIGH (ref 0–19)

## 2023-11-27 MED ORDER — ROSUVASTATIN CALCIUM 10 MG PO TABS: ORAL_TABLET | ORAL | 4 refills | Status: AC

## 2023-11-27 MED ORDER — AMLODIPINE BESYLATE 10 MG PO TABS: 10.0000 mg | ORAL_TABLET | Freq: Every day | ORAL | 4 refills | Status: AC

## 2023-11-27 MED ORDER — LISINOPRIL-HYDROCHLOROTHIAZIDE 20-25 MG PO TABS: 1.0000 | ORAL_TABLET | Freq: Every day | ORAL | 4 refills | Status: AC

## 2023-11-27 NOTE — Assessment & Plan Note (Signed)
Chronic, stable. Continue with CPAP QHS, 100% use 

## 2023-11-27 NOTE — Progress Notes (Signed)
 BP 118/70 (BP Location: Left Arm, Patient Position: Sitting, Cuff Size: Normal)   Pulse 84   Temp (!) 97.4 F (36.3 C) (Oral)   Ht 5' 9 (1.753 m)   Wt 256 lb 9.6 oz (116.4 kg)   SpO2 96%   BMI 37.89 kg/m    Subjective:    Patient ID: Grant Diaz, male    DOB: 1951-07-24, 73 y.o.   MRN: [id number]  HPI: Grant Diaz is a 73 y.o. male  Chief Complaint  Patient presents with   Barrett's Esophagus   Hyperlipidemia   Hypertension   HYPERTENSION & A - FIB & HLD Taking Zestoretic  20-25, Xarelto , Amlodipine , and Crestor .  Follows with cardiology, Dr. Fernand, last visit 11/08/23.  Echo January 2024 -- noted trace MR/TR and LVEF 71%.   Uses CPAP every night. Endorses wearing compression stockings daily and avoidance of sitting for long periods of time. Hypertension status: controlled  Satisfied with current treatment? yes Duration of hypertension: chronic BP monitoring frequency: not checking BP range:  BP medication side effects:  no Medication compliance: good compliance Aspirin : no Recurrent headaches: no Visual changes: no Palpitations: no Dyspnea: no Chest pain: no Lower extremity edema: no Dizzy/lightheaded: no  The ASCVD Risk score (Arnett DK, et al., 2019) failed to calculate for the following reasons:   The valid total cholesterol range is 130 to 320 mg/dL  GERD/BARRET'S Taking Prilosec 40 MG. Last saw GI 07/09/23 with endoscopy in December 2024.  Has history of pancreatitis.  Has changed diet regimen which is helping. GERD control status: controlled  Satisfied with current treatment? yes Heartburn frequency: not often Medication side effects: no  Medication compliance: stable Previous GERD medications: multiple in past Antacid use frequency: occasional Aggravating factors: larger meals, greasy food, Happy Belly coffee Dysphagia: no  Odynophagia:  no Hematemesis: no Blood in stool: no EGD: yes    Hx PROSTATE CANCER Followed by urology in past, Dr.  Twylla, for hx of prostate cancer - had external and internal radiation (seeds). To return as needed. Current plan is to watch closely.  Urology notes reviewed, patient requests PSA in house today and to forward result to Dr. Twylla. Duration: chronic Nocturia: 2 times a night Urinary frequency:no Incomplete voiding: no Urgency: occasional Weak urinary stream: no Straining to start stream: no Dysuria: no Onset: gradual Severity: mild IPSS Questionnaire (AUA-7): Over the past month.   1)  How often have you had a sensation of not emptying your bladder completely after you finish urinating?  0 - Not at all  2)  How often have you had to urinate again less than two hours after you finished urinating? 0 - Not at all  3)  How often have you found you stopped and started again several times when you urinated?  1 - Less than 1 time in 5  4) How difficult have you found it to postpone urination?  1 - Less than 1 time in 5  5) How often have you had a weak urinary stream?  1 - Less than 1 time in 5  6) How often have you had to push or strain to begin urination?  1 - Less than 1 time in 5  7) How many times did you most typically get up to urinate from the time you went to bed until the time you got up in the morning?  2 - 2 times  Total score:  0-7 mildly symptomatic   8-19 moderately symptomatic  20-35 severely symptomatic    Relevant past medical, surgical, family and social history reviewed and updated as indicated. Interim medical history since our last visit reviewed. Allergies and medications reviewed and updated.  Review of Systems  Constitutional:  Negative for activity change, diaphoresis, fatigue and fever.  Respiratory:  Negative for cough, chest tightness, shortness of breath and wheezing.   Cardiovascular:  Negative for chest pain, palpitations and leg swelling.  Gastrointestinal: Negative.   Neurological: Negative.   Psychiatric/Behavioral: Negative.      Per HPI unless  specifically indicated above     Objective:    BP 118/70 (BP Location: Left Arm, Patient Position: Sitting, Cuff Size: Normal)   Pulse 84   Temp (!) 97.4 F (36.3 C) (Oral)   Ht 5' 9 (1.753 m)   Wt 256 lb 9.6 oz (116.4 kg)   SpO2 96%   BMI 37.89 kg/m   Wt Readings from Last 3 Encounters:  11/27/23 256 lb 9.6 oz (116.4 kg)  11/08/23 255 lb 9.6 oz (115.9 kg)  08/07/23 255 lb (115.7 kg)    Physical Exam Vitals and nursing note reviewed.  Constitutional:      General: He is awake. He is not in acute distress.    Appearance: He is well-developed. He is obese. He is not ill-appearing.  HENT:     Head: Normocephalic and atraumatic.     Right Ear: Hearing normal. No drainage.     Left Ear: Hearing normal. No drainage.  Eyes:     General: Lids are normal.        Right eye: No discharge.        Left eye: No discharge.     Conjunctiva/sclera: Conjunctivae normal.     Pupils: Pupils are equal, round, and reactive to light.  Neck:     Vascular: No carotid bruit.  Cardiovascular:     Rate and Rhythm: Normal rate and regular rhythm.     Heart sounds: Normal heart sounds, S1 normal and S2 normal. No murmur heard.    No gallop.  Pulmonary:     Effort: Pulmonary effort is normal. No accessory muscle usage or respiratory distress.     Breath sounds: Normal breath sounds.  Abdominal:     General: Bowel sounds are normal. There is no distension.     Palpations: Abdomen is soft.     Tenderness: There is no abdominal tenderness. There is no right CVA tenderness or left CVA tenderness.     Hernia: No hernia is present.  Musculoskeletal:        General: Normal range of motion.     Cervical back: Normal range of motion and neck supple.     Right lower leg: No edema.     Left lower leg: No edema.  Skin:    General: Skin is warm and dry.     Comments: Scattered pale bruising to bilateral upper extremities, skin intact.  Neurological:     Mental Status: He is alert and oriented to  person, place, and time.  Psychiatric:        Attention and Perception: Attention normal.        Mood and Affect: Mood normal.        Speech: Speech normal.        Behavior: Behavior normal. Behavior is cooperative.        Thought Content: Thought content normal.     Results for orders placed or performed in visit on 01/10/23  Lab report -  scanned   Collection Time: 01/25/22  9:52 AM  Result Value Ref Range   EGFR 92.0       Assessment & Plan:   Problem List Items Addressed This Visit       Cardiovascular and Mediastinum   Atrial fibrillation (HCC) - Primary   Chronic with rate controlled.  Diagnosed by cardiology. Dr. Fernand, prior to surgery 05/31/21.  No current BB or rate control medication.  Continue Xarelto  as ordered. Recent notes and echo reviewed.      Relevant Medications   amLODipine  (NORVASC ) 10 MG tablet   lisinopril -hydrochlorothiazide  (ZESTORETIC ) 20-25 MG tablet   rosuvastatin  (CRESTOR ) 10 MG tablet   Essential hypertension, benign   Chronic, stable with BP well below goal for age. Recommend he monitor BP at least a few mornings a week at home and document.  DASH diet at home.  Continue current medication regimen and adjust as needed based on levels.  Labs today: CMP, CBC, urine ALB, TSH.  Urine ALB 10 (February 2024).  Refills sent.  Return in 6 months.       Relevant Medications   amLODipine  (NORVASC ) 10 MG tablet   lisinopril -hydrochlorothiazide  (ZESTORETIC ) 20-25 MG tablet   rosuvastatin  (CRESTOR ) 10 MG tablet   Other Relevant Orders   Microalbumin, Urine Waived   CBC with Differential/Platelet   TSH   Senile purpura (HCC)   Ongoing.  Noted on exam, discussed with patient.  Ensure gentle skin care and monitor for wounds, alert provider if they present.      Relevant Medications   amLODipine  (NORVASC ) 10 MG tablet   lisinopril -hydrochlorothiazide  (ZESTORETIC ) 20-25 MG tablet   rosuvastatin  (CRESTOR ) 10 MG tablet   Other Relevant Orders   CBC with  Differential/Platelet     Respiratory   Sleep apnea   Chronic, stable. Continue with CPAP QHS, 100% use        Digestive   Barrett esophagus   Chronic, stable.   Continue current medication regimen and adjust as needed, lifelong PPI.  Mag level annually. Risks of PPI use were discussed with patient including bone loss, C. Diff diarrhea, pneumonia, infections, CKD, electrolyte abnormalities. Verbalizes understanding and chooses to continue the medication. Mag level annually.       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  CBC annually and monitor for bleeding.      Relevant Orders   CBC with Differential/Platelet   Thrombocytopenia (HCC)   Noted on labs since October 2022 -- recheck CBC today and monitor closely.      Relevant Orders   CBC with Differential/Platelet     Other   History of prostate cancer   Stable.  PSA today. Continue to collaborate with Dr. Stoioff as needed, patient wishes PSA result relayed to him.      Relevant Orders   PSA   Mixed hyperlipidemia   Chronic, ongoing.  Poor tolerance of daily Crestor  at low dose.  Will continue 3 day a week dosing at this time.  Lipid panel today.  Educated patient on statin use and side effects.  Could consider Zetia if elevations noted.      Relevant Medications   amLODipine  (NORVASC ) 10 MG tablet   lisinopril -hydrochlorothiazide  (ZESTORETIC ) 20-25 MG tablet   rosuvastatin  (CRESTOR ) 10 MG tablet   Other Relevant Orders   Comprehensive metabolic panel   Lipid Panel w/o Chol/HDL Ratio   Morbid obesity (HCC)  BMI 37.89 with A-fib, HTN/HLD.  Recommended eating smaller high protein, low fat meals more frequently and exercising 30 mins a day 5 times a week with a goal of 10-15lb weight loss in the next 3 months. Patient voiced their understanding and motivation to adhere to these recommendations.  Can not use GLP1 due to past pancreatitis  issues.         Follow up plan: Return in about 6 months (around 05/26/2024) for HTN/HLD, A-FIB, GERD.

## 2023-11-27 NOTE — Assessment & Plan Note (Signed)
 Chronic, stable with BP well below goal for age. Recommend he monitor BP at least a few mornings a week at home and document.  DASH diet at home.  Continue current medication regimen and adjust as needed based on levels.  Labs today: CMP, CBC, urine ALB, TSH.  Urine ALB 10 (February 2024).  Refills sent.  Return in 6 months.

## 2023-11-27 NOTE — Assessment & Plan Note (Signed)
Ongoing.  Noted on exam, discussed with patient.  Ensure gentle skin care and monitor for wounds, alert provider if they present.

## 2023-11-27 NOTE — Assessment & Plan Note (Signed)
Stable.  PSA today. Continue to collaborate with Dr. Bernardo Heater as needed, patient wishes PSA result relayed to him.

## 2023-11-27 NOTE — Assessment & Plan Note (Signed)
Chronic with rate controlled.  Diagnosed by cardiology. Dr. Welton Flakes, prior to surgery 05/31/21.  No current BB or rate control medication.  Continue Xarelto as ordered. Recent notes and echo reviewed.

## 2023-11-27 NOTE — Assessment & Plan Note (Signed)
Due to A-Fib and Xarelto.  CBC annually and monitor for bleeding. 

## 2023-11-27 NOTE — Assessment & Plan Note (Signed)
Noted on labs since October 2022 -- recheck CBC today and monitor closely.

## 2023-11-27 NOTE — Assessment & Plan Note (Signed)
Chronic, ongoing.  Poor tolerance of daily Crestor at low dose.  Will continue 3 day a week dosing at this time.  Lipid panel today.  Educated patient on statin use and side effects.  Could consider Zetia if elevations noted.

## 2023-11-27 NOTE — Assessment & Plan Note (Signed)
 BMI 37.89 with A-fib, HTN/HLD.  Recommended eating smaller high protein, low fat meals more frequently and exercising 30 mins a day 5 times a week with a goal of 10-15lb weight loss in the next 3 months. Patient voiced their understanding and motivation to adhere to these recommendations.  Can not use GLP1 due to past pancreatitis issues.

## 2023-11-27 NOTE — Assessment & Plan Note (Signed)
 Chronic, stable.   Continue current medication regimen and adjust as needed, lifelong PPI.  Mag level annually. Risks of PPI use were discussed with patient including bone loss, C. Diff diarrhea, pneumonia, infections, CKD, electrolyte abnormalities. Verbalizes understanding and chooses to continue the medication. Mag level annually.

## 2023-11-28 ENCOUNTER — Encounter: Payer: Self-pay | Admitting: Nurse Practitioner

## 2023-11-28 LAB — COMPREHENSIVE METABOLIC PANEL
ALT: 12 [IU]/L (ref 0–44)
AST: 19 [IU]/L (ref 0–40)
Albumin: 4.1 g/dL (ref 3.8–4.8)
Alkaline Phosphatase: 83 [IU]/L (ref 44–121)
BUN/Creatinine Ratio: 22 (ref 10–24)
BUN: 23 mg/dL (ref 8–27)
Bilirubin Total: 0.3 mg/dL (ref 0.0–1.2)
CO2: 24 mmol/L (ref 20–29)
Calcium: 9 mg/dL (ref 8.6–10.2)
Chloride: 103 mmol/L (ref 96–106)
Creatinine, Ser: 1.04 mg/dL (ref 0.76–1.27)
Globulin, Total: 2.6 g/dL (ref 1.5–4.5)
Glucose: 107 mg/dL — ABNORMAL HIGH (ref 70–99)
Potassium: 3.5 mmol/L (ref 3.5–5.2)
Sodium: 142 mmol/L (ref 134–144)
Total Protein: 6.7 g/dL (ref 6.0–8.5)
eGFR: 75 mL/min/{1.73_m2} (ref >59)

## 2023-11-28 LAB — CBC WITH DIFFERENTIAL/PLATELET
Basophils Absolute: 0.1 10*3/uL (ref 0.0–0.2)
Basos: 1 %
EOS (ABSOLUTE): 0.5 10*3/uL — ABNORMAL HIGH (ref 0.0–0.4)
Eos: 8 %
Hematocrit: 39.6 % (ref 37.5–51.0)
Hemoglobin: 13.1 g/dL (ref 13.0–17.7)
Immature Grans (Abs): 0 10*3/uL (ref 0.0–0.1)
Immature Granulocytes: 0 %
Lymphocytes Absolute: 1.7 10*3/uL (ref 0.7–3.1)
Lymphs: 28 %
MCH: 30.3 pg (ref 26.6–33.0)
MCHC: 33.1 g/dL (ref 31.5–35.7)
MCV: 92 fL (ref 79–97)
Monocytes Absolute: 0.6 10*3/uL (ref 0.1–0.9)
Monocytes: 10 %
Neutrophils Absolute: 3 10*3/uL (ref 1.4–7.0)
Neutrophils: 53 %
Platelets: 190 10*3/uL (ref 150–450)
RBC: 4.33 x10E6/uL (ref 4.14–5.80)
RDW: 13.1 % (ref 11.6–15.4)
WBC: 5.8 10*3/uL (ref 3.4–10.8)

## 2023-11-28 LAB — MAGNESIUM: Magnesium: 1.8 mg/dL (ref 1.6–2.3)

## 2023-11-28 LAB — LIPID PANEL W/O CHOL/HDL RATIO
Cholesterol, Total: 133 mg/dL (ref 100–199)
HDL: 43 mg/dL (ref >39)
LDL Chol Calc (NIH): 71 mg/dL (ref 0–99)
Triglycerides: 104 mg/dL (ref 0–149)
VLDL Cholesterol Cal: 19 mg/dL (ref 5–40)

## 2023-11-28 LAB — PSA: Prostate Specific Ag, Serum: <0.1 ng/mL (ref 0.0–4.0)

## 2023-11-28 LAB — TSH: TSH: 1.4 u[IU]/mL (ref 0.450–4.500)

## 2023-11-28 NOTE — Progress Notes (Signed)
 Good day Dr. Cherylene Corrente, I promised Rajendra I would let you know his PSA and it is still stable.:) Have a great day!!

## 2023-11-28 NOTE — Progress Notes (Signed)
 Contacted via MyChart   Good evening Grant Diaz, your labs have returned: - CBC shows no anemia or infection - Kidney function, creatinine and eGFR, remains normal, as is liver function, AST and ALT.  - Lipid panel shows stable levels - Remainder of blood work stable, no medication changes needed.  I will let Dt. Stoioff know about your PSA.  Any questions? Keep being amazing!!  Thank you for allowing me to participate in your care.  I appreciate you. Kindest regards, Shivali Quackenbush

## 2023-12-03 DIAGNOSIS — M545 Low back pain, unspecified: Secondary | ICD-10-CM | POA: Diagnosis not present

## 2023-12-07 ENCOUNTER — Telehealth: Payer: Self-pay | Admitting: Nurse Practitioner

## 2023-12-07 NOTE — Telephone Encounter (Signed)
Patient dropped off document  Division of Motor Vehicles Consent Form , to be filled out by provider. Patient requested to send it back via Call Patient to pick up within 5-days. Document is located in providers folder. Please advise at Mobile [phone] (mobile)   Also patient left [facility] Mychart visit summary for provider to review.

## 2023-12-10 NOTE — Telephone Encounter (Signed)
Paperwork completed. Copy placed in scan bin and original placed in bin for pick up. Called and LVM notifying patient that paperwork was ready to be picked up.

## 2023-12-10 NOTE — Telephone Encounter (Signed)
 Paperwork given to provider to complete and sign

## 2023-12-13 ENCOUNTER — Other Ambulatory Visit: Payer: Self-pay | Admitting: Cardiovascular Disease

## 2023-12-13 DIAGNOSIS — G4733 Obstructive sleep apnea (adult) (pediatric): Secondary | ICD-10-CM

## 2023-12-13 DIAGNOSIS — I480 Paroxysmal atrial fibrillation: Secondary | ICD-10-CM

## 2023-12-13 DIAGNOSIS — I10 Essential (primary) hypertension: Secondary | ICD-10-CM

## 2023-12-13 DIAGNOSIS — I340 Nonrheumatic mitral (valve) insufficiency: Secondary | ICD-10-CM

## 2023-12-13 DIAGNOSIS — E782 Mixed hyperlipidemia: Secondary | ICD-10-CM

## 2024-01-08 ENCOUNTER — Other Ambulatory Visit: Payer: Self-pay | Admitting: Cardiology

## 2024-01-08 DIAGNOSIS — G4733 Obstructive sleep apnea (adult) (pediatric): Secondary | ICD-10-CM

## 2024-01-08 DIAGNOSIS — I340 Nonrheumatic mitral (valve) insufficiency: Secondary | ICD-10-CM

## 2024-01-08 DIAGNOSIS — E782 Mixed hyperlipidemia: Secondary | ICD-10-CM

## 2024-01-08 DIAGNOSIS — I10 Essential (primary) hypertension: Secondary | ICD-10-CM

## 2024-01-08 DIAGNOSIS — I480 Paroxysmal atrial fibrillation: Secondary | ICD-10-CM

## 2024-01-29 ENCOUNTER — Other Ambulatory Visit: Payer: Self-pay | Admitting: Nurse Practitioner

## 2024-01-30 NOTE — Telephone Encounter (Signed)
 Requested medication (s) are due for refill today: na   Requested medication (s) are on the active medication list: yes   Last refill:  11/05/23 #90 0 refills  Future visit scheduled: yes in 3 months   Notes to clinic:  do you want to refill Rx since patient taking xarelto?     Requested Prescriptions  Pending Prescriptions Disp Refills   celecoxib (CELEBREX) 100 MG capsule [Pharmacy Med Name: Celecoxib 100 MG Oral Capsule] 90 capsule 0    Sig: TAKE 1 CAPSULE BY MOUTH ONCE DAILY AS NEEDED .USE  MINIMALLY  SINCE  TAKING  XARELTO     Analgesics:  COX2 Inhibitors Failed - 01/30/2024 11:44 AM      Failed - Manual Review: Labs are only required if the patient has taken medication for more than 8 weeks.      Passed - HGB in normal range and within 360 days    Hemoglobin  Date Value Ref Range Status  11/27/2023 13.1 13.0 - 17.7 g/dL Final         Passed - Cr in normal range and within 360 days    Creatinine, Ser  Date Value Ref Range Status  11/27/2023 1.04 0.76 - 1.27 mg/dL Final         Passed - HCT in normal range and within 360 days    Hematocrit  Date Value Ref Range Status  11/27/2023 39.6 37.5 - 51.0 % Final         Passed - AST in normal range and within 360 days    AST  Date Value Ref Range Status  11/27/2023 19 0 - 40 IU/L Final         Passed - ALT in normal range and within 360 days    ALT  Date Value Ref Range Status  11/27/2023 12 0 - 44 IU/L Final         Passed - eGFR is 30 or above and within 360 days    GFR calc Af Amer  Date Value Ref Range Status  11/24/2020 101 >59 mL/min/1.73 Final    Comment:    **In accordance with recommendations from the NKF-ASN Task force,**   [facility] is in the process of updating its eGFR calculation to the   2021 CKD-EPI creatinine equation that estimates kidney function   without a race variable.    GFR, Estimated  Date Value Ref Range Status  06/01/2021 >60 >60 mL/min Final    Comment:    (NOTE) Calculated using  the CKD-EPI Creatinine Equation (2021)    eGFR  Date Value Ref Range Status  11/27/2023 75 >59 mL/min/1.73 Final         Passed - Patient is not pregnant      Passed - Valid encounter within last 12 months    Recent Outpatient Visits           2 months ago Paroxysmal atrial fibrillation [facility]   [facility] T, NP       Future Appointments             In 1 week Laurier Nancy, MD [facility]   In 2 weeks Deirdre Evener, MD [facility]   In 3 months [place], Dorie Rank, NP [facility]

## 2024-02-05 ENCOUNTER — Other Ambulatory Visit: Payer: Self-pay | Admitting: Cardiology

## 2024-02-05 DIAGNOSIS — I480 Paroxysmal atrial fibrillation: Secondary | ICD-10-CM

## 2024-02-05 DIAGNOSIS — I340 Nonrheumatic mitral (valve) insufficiency: Secondary | ICD-10-CM

## 2024-02-05 DIAGNOSIS — G4733 Obstructive sleep apnea (adult) (pediatric): Secondary | ICD-10-CM

## 2024-02-05 DIAGNOSIS — E782 Mixed hyperlipidemia: Secondary | ICD-10-CM

## 2024-02-05 DIAGNOSIS — I10 Essential (primary) hypertension: Secondary | ICD-10-CM

## 2024-02-08 ENCOUNTER — Ambulatory Visit: Payer: Medicare PPO | Admitting: Cardiovascular Disease

## 2024-02-12 ENCOUNTER — Ambulatory Visit: Admitting: Cardiovascular Disease

## 2024-02-12 ENCOUNTER — Encounter: Payer: Self-pay | Admitting: Cardiovascular Disease

## 2024-02-12 VITALS — BP 126/80 | HR 76 | Ht 69.0 in | Wt 260.0 lb

## 2024-02-12 DIAGNOSIS — I10 Essential (primary) hypertension: Secondary | ICD-10-CM

## 2024-02-12 DIAGNOSIS — I340 Nonrheumatic mitral (valve) insufficiency: Secondary | ICD-10-CM | POA: Diagnosis not present

## 2024-02-12 DIAGNOSIS — I480 Paroxysmal atrial fibrillation: Secondary | ICD-10-CM | POA: Diagnosis not present

## 2024-02-12 DIAGNOSIS — E782 Mixed hyperlipidemia: Secondary | ICD-10-CM | POA: Diagnosis not present

## 2024-02-12 DIAGNOSIS — G4733 Obstructive sleep apnea (adult) (pediatric): Secondary | ICD-10-CM

## 2024-02-12 MED ORDER — AMIODARONE HCL 200 MG PO TABS: ORAL_TABLET | ORAL | 0 refills | Status: DC

## 2024-02-12 NOTE — Progress Notes (Signed)
 Cardiology Office Note   Date:  02/12/2024   ID:  Grant Diaz, DOB 11/16/1950, MRN [id number]  PCP:  Lemar Pyles, NP  Cardiologist:  Debborah Fairly, MD      History of Present Illness: Grant Diaz is a 73 y.o. male who presents for  Chief Complaint  Patient presents with   Follow-up    3 month follow up    Has been doing water aerobics, and not SOB.      Past Medical History:  Diagnosis Date   Arthritis    Atrial fibrillation (HCC)    Barrett's esophagus    Cancer (HCC) 2008   Prostate-radiation   Cataract 2019   not yet significant enough to warrant surgery   Dysrhythmia    Afib   GERD (gastroesophageal reflux disease)    History of DVT (deep vein thrombosis)    History of hiatal hernia    History of kidney stones    Hypertension    Joint pain    Mixed hyperlipidemia    Pancreatitis 2013   [facility]   Prostate cancer [facility])    Pulmonary embolus (HCC) 12/2014   Bilateral pulmonary embolus, [facility]   Sleep apnea 2010   use CPAP regularly   Sleep apnea, obstructive    cpap     Past Surgical History:  Procedure Laterality Date   BIOPSY  06/12/2023   Procedure: BIOPSY;  Surgeon: Luke Salaam, MD;  Location: [facility] ENDOSCOPY;  Service: Gastroenterology;;   Ritta Chessman RELEASE Right 03/15/2017   Procedure: CARPAL TUNNEL RELEASE;  Surgeon: Molli Angelucci, MD;  Location: [facility] ORS;  Service: Orthopedics;  Laterality: Right;   CHOLECYSTECTOMY     COLONOSCOPY WITH PROPOFOL  N/A 04/02/2018   Procedure: COLONOSCOPY WITH PROPOFOL ;  Surgeon: Marnee Sink, MD;  Location: [facility] ENDOSCOPY;  Service: Endoscopy;  Laterality: N/A;   COLONOSCOPY WITH PROPOFOL  N/A 06/12/2023   Procedure: COLONOSCOPY WITH PROPOFOL ;  Surgeon: Luke Salaam, MD;  Location: [facility] ENDOSCOPY;  Service: Gastroenterology;  Laterality: N/A;   ESOPHAGOGASTRODUODENOSCOPY N/A 05/28/2013   Procedure: ESOPHAGOGASTRODUODENOSCOPY (EGD);  Surgeon: Thayne Fine, MD;  Location: [facility] ENDOSCOPY;  Service: General;   Laterality: N/A;   ESOPHAGOGASTRODUODENOSCOPY (EGD) WITH PROPOFOL  N/A 02/08/2021   Procedure: ESOPHAGOGASTRODUODENOSCOPY (EGD) WITH PROPOFOL ;  Surgeon: Irby Mannan, MD;  Location: [facility] ENDOSCOPY;  Service: Endoscopy;  Laterality: N/A;   ESOPHAGOGASTRODUODENOSCOPY (EGD) WITH PROPOFOL  N/A 06/12/2023   Procedure: ESOPHAGOGASTRODUODENOSCOPY (EGD) WITH PROPOFOL ;  Surgeon: Luke Salaam, MD;  Location: [facility] ENDOSCOPY;  Service: Gastroenterology;  Laterality: N/A;   GALLBLADDER SURGERY  2003   GANGLION CYST EXCISION Right 03/15/2017   Procedure: REMOVAL GANGLION OF WRIST;  Surgeon: Molli Angelucci, MD;  Location: [facility] ORS;  Service: Orthopedics;  Laterality: Right;   HERNIA REPAIR     HIATAL HERNIA REPAIR N/A 05/31/2021   Procedure: LAPAROSCOPIC REPAIR OF HIATAL HERNIA WITH LAP BAND REMOVAL;  Surgeon: Kinsinger, Alphonso Aschoff, MD;  Location: [facility] ORS;  Service: General;  Laterality: N/A;   JOINT REPLACEMENT     KNEE ARTHROSCOPY Bilateral [id number]   LAPAROSCOPIC GASTRIC BANDING  2009   and hernia repair   LAPAROSCOPIC REPAIR OF HIATAL HERNIA WITH LAP BAND REMOVAL   05/31/2021   POLYPECTOMY  06/12/2023   Procedure: POLYPECTOMY;  Surgeon: Luke Salaam, MD;  Location: [facility] ENDOSCOPY;  Service: Gastroenterology;;   RADIATION IMPLANT W/ ULTRASOUND, MALE  2005   internal/external radiation for prostate cancer   TOTAL KNEE ARTHROPLASTY Left 10/12/2015   Procedure: TOTAL KNEE ARTHROPLASTY;  Surgeon: Bambi Lever  Mozell Arias, MD;  Location: [facility] ORS;  Service: Orthopedics;  Laterality: Left;   TOTAL KNEE ARTHROPLASTY Right 05/15/2017   Procedure: TOTAL KNEE ARTHROPLASTY;  Surgeon: Molli Angelucci, MD;  Location: [facility] ORS;  Service: Orthopedics;  Laterality: Right;   UMBILICAL HERNIA REPAIR  2010     Current Outpatient Medications  Medication Sig Dispense Refill   acetaminophen  (TYLENOL ) 325 MG tablet Take 650 mg by mouth every 6 (six) hours as needed for mild pain.     amiodarone  (PACERONE ) 200 MG tablet Take 4  tablets/day for 1 week, then 3 tablets/day for 1 week, then 2 tablets/day for 1 week. Then follow up. 70 tablet 0   amLODipine  (NORVASC ) 10 MG tablet Take 1 tablet (10 mg total) by mouth daily. 90 tablet 4   celecoxib  (CELEBREX ) 100 MG capsule TAKE 1 CAPSULE BY MOUTH ONCE DAILY AS NEEDED .USE  MINIMALLY  SINCE  TAKING  XARELTO  90 capsule 0   Cholecalciferol  25 MCG (1000 UT) capsule Take 1,000 Units by mouth daily.     ketoconazole  (NIZORAL ) 2 % cream Qhs to aa rash groin prn flares also use to both feet nightly for tinea pedis 60 g 11   lisinopril -hydrochlorothiazide  (ZESTORETIC ) 20-25 MG tablet Take 1 tablet by mouth daily. 90 tablet 4   Multiple Vitamin (MULTIVITAMIN) capsule Take 1 capsule by mouth daily.     omeprazole  (PRILOSEC) 40 MG capsule Take 1 capsule (40 mg total) by mouth 2 (two) times daily. 60 capsule 11   rosuvastatin  (CRESTOR ) 10 MG tablet Take one tablet (10 MG) by mouth three times a week. 36 tablet 4   triamcinolone  cream (KENALOG ) 0.1 % Apply 1 Application topically as directed. Qd up to 5 days per week to aa eczema on body until clear, avoid face, groin, axilla 80 g 6   XARELTO  20 MG TABS tablet Take 1 tablet by mouth once daily 30 tablet 0   No current facility-administered medications for this visit.    Allergies:   Adhesive [tape]    Social History:   reports that he has never smoked. He has never used smokeless tobacco. He reports that he does not currently use alcohol. He reports that he does not use drugs.   Family History:  family history includes Arthritis in his brother and mother; Asthma in his brother; Diabetes in his mother and son; Heart disease in his maternal grandmother and paternal grandmother; Hypertension in his brother; Obesity in his mother; Parkinson's disease in his father; Stroke in his maternal grandfather and paternal grandfather.    ROS:     Review of Systems  Constitutional: Negative.   HENT: Negative.    Eyes: Negative.   Respiratory:  Negative.    Gastrointestinal: Negative.   Genitourinary: Negative.   Musculoskeletal: Negative.   Skin: Negative.   Neurological: Negative.   Endo/Heme/Allergies: Negative.   Psychiatric/Behavioral: Negative.    All other systems reviewed and are negative.     All other systems are reviewed and negative.    PHYSICAL EXAM: VS:  BP 126/80   Pulse 76   Ht 5\' 9"  (1.753 m)   Wt 260 lb (117.9 kg)   SpO2 97%   BMI 38.40 kg/m  , BMI Body mass index is 38.4 kg/m. Last weight:  Wt Readings from Last 3 Encounters:  02/12/24 260 lb (117.9 kg)  11/27/23 256 lb 9.6 oz (116.4 kg)  11/08/23 255 lb 9.6 oz (115.9 kg)     Physical Exam Vitals reviewed.  Constitutional:  Appearance: Normal appearance. He is normal weight.  HENT:     Head: Normocephalic.     Nose: Nose normal.     Mouth/Throat:     Mouth: Mucous membranes are moist.  Eyes:     Pupils: Pupils are equal, round, and reactive to light.  Cardiovascular:     Rate and Rhythm: Normal rate and regular rhythm.     Pulses: Normal pulses.     Heart sounds: Normal heart sounds.  Pulmonary:     Effort: Pulmonary effort is normal.  Abdominal:     General: Abdomen is flat. Bowel sounds are normal.  Musculoskeletal:        General: Normal range of motion.     Cervical back: Normal range of motion.  Skin:    General: Skin is warm.  Neurological:     General: No focal deficit present.     Mental Status: He is alert.  Psychiatric:        Mood and Affect: Mood normal.       EKG:   Recent Labs: 11/27/2023: ALT 12; BUN 23; Creatinine, Ser 1.04; Hemoglobin 13.1; Magnesium  1.8; Platelets 190; Potassium 3.5; Sodium 142; TSH 1.400    Lipid Panel    Component Value Date/Time   CHOL 133 11/27/2023 1600   TRIG 104 11/27/2023 1600   HDL 43 11/27/2023 1600   LDLCALC 71 11/27/2023 1600      Other studies Reviewed: Additional studies/ records that were reviewed today include:  Review of the above records demonstrates:       01/21/2018    4:36 PM  PAD Screen  Previous PAD dx? No  Previous surgical procedure? No  Pain with walking? No  Feet/toe relief with dangling? No  Painful, non-healing ulcers? No  Extremities discolored? No      ASSESSMENT AND PLAN:    ICD-10-CM   1. Paroxysmal atrial fibrillation (HCC)  I48.0 PCV ECHOCARDIOGRAM COMPLETE    amiodarone  (PACERONE ) 200 MG tablet   In afib and is taking xarelto  for stoke prevention., start amiodrone    2. Essential hypertension, benign  I10 PCV ECHOCARDIOGRAM COMPLETE    amiodarone  (PACERONE ) 200 MG tablet    3. Mixed hyperlipidemia  E78.2 PCV ECHOCARDIOGRAM COMPLETE    amiodarone  (PACERONE ) 200 MG tablet    4. Nonrheumatic mitral valve regurgitation  I34.0 PCV ECHOCARDIOGRAM COMPLETE    amiodarone  (PACERONE ) 200 MG tablet    5. Obstructive sleep apnea syndrome  G47.33 PCV ECHOCARDIOGRAM COMPLETE    amiodarone  (PACERONE ) 200 MG tablet       Problem List Items Addressed This Visit       Cardiovascular and Mediastinum   Essential hypertension, benign   Relevant Medications   amiodarone  (PACERONE ) 200 MG tablet   Other Relevant Orders   PCV ECHOCARDIOGRAM COMPLETE   Atrial fibrillation (HCC) - Primary   Relevant Medications   amiodarone  (PACERONE ) 200 MG tablet   Other Relevant Orders   PCV ECHOCARDIOGRAM COMPLETE     Respiratory   Sleep apnea   Relevant Medications   amiodarone  (PACERONE ) 200 MG tablet   Other Relevant Orders   PCV ECHOCARDIOGRAM COMPLETE     Other   Mixed hyperlipidemia   Relevant Medications   amiodarone  (PACERONE ) 200 MG tablet   Other Relevant Orders   PCV ECHOCARDIOGRAM COMPLETE   Other Visit Diagnoses       Nonrheumatic mitral valve regurgitation       Relevant Medications   amiodarone  (PACERONE ) 200 MG tablet  Other Relevant Orders   PCV ECHOCARDIOGRAM COMPLETE          Disposition:   Return in about 1 month (around 03/13/2024) for echo and f/u.    Total time spent: 30  minutes  Signed,  Debborah Fairly, MD  02/12/2024 11:01 AM    [facility]

## 2024-02-13 ENCOUNTER — Ambulatory Visit: Payer: Medicare PPO | Admitting: Dermatology

## 2024-02-13 ENCOUNTER — Encounter: Payer: Self-pay | Admitting: Dermatology

## 2024-02-13 DIAGNOSIS — D229 Melanocytic nevi, unspecified: Secondary | ICD-10-CM

## 2024-02-13 DIAGNOSIS — I781 Nevus, non-neoplastic: Secondary | ICD-10-CM

## 2024-02-13 DIAGNOSIS — Z5111 Encounter for antineoplastic chemotherapy: Secondary | ICD-10-CM

## 2024-02-13 DIAGNOSIS — L820 Inflamed seborrheic keratosis: Secondary | ICD-10-CM

## 2024-02-13 DIAGNOSIS — W908XXA Exposure to other nonionizing radiation, initial encounter: Secondary | ICD-10-CM

## 2024-02-13 DIAGNOSIS — I8312 Varicose veins of left lower extremity with inflammation: Secondary | ICD-10-CM

## 2024-02-13 DIAGNOSIS — L814 Other melanin hyperpigmentation: Secondary | ICD-10-CM

## 2024-02-13 DIAGNOSIS — L821 Other seborrheic keratosis: Secondary | ICD-10-CM

## 2024-02-13 DIAGNOSIS — R238 Other skin changes: Secondary | ICD-10-CM

## 2024-02-13 DIAGNOSIS — I872 Venous insufficiency (chronic) (peripheral): Secondary | ICD-10-CM

## 2024-02-13 DIAGNOSIS — I8311 Varicose veins of right lower extremity with inflammation: Secondary | ICD-10-CM

## 2024-02-13 DIAGNOSIS — L578 Other skin changes due to chronic exposure to nonionizing radiation: Secondary | ICD-10-CM | POA: Diagnosis not present

## 2024-02-13 DIAGNOSIS — Z1283 Encounter for screening for malignant neoplasm of skin: Secondary | ICD-10-CM

## 2024-02-13 DIAGNOSIS — Z79899 Other long term (current) drug therapy: Secondary | ICD-10-CM

## 2024-02-13 DIAGNOSIS — D1801 Hemangioma of skin and subcutaneous tissue: Secondary | ICD-10-CM

## 2024-02-13 DIAGNOSIS — I878 Other specified disorders of veins: Secondary | ICD-10-CM

## 2024-02-13 DIAGNOSIS — L570 Actinic keratosis: Secondary | ICD-10-CM | POA: Diagnosis not present

## 2024-02-13 DIAGNOSIS — Z7189 Other specified counseling: Secondary | ICD-10-CM

## 2024-02-13 MED ORDER — FLUOROURACIL 5 % EX CREA: TOPICAL_CREAM | Freq: Two times a day (BID) | CUTANEOUS | 0 refills | Status: DC

## 2024-02-13 NOTE — Patient Instructions (Addendum)
 On June 1st start 5FU/Calcipotriene mix to the top of scalp, forehead, and temple.   Instructions for [facility] Medications  One or more of your medications was sent to the [facility] mail order compounding pharmacy. You will receive an email from them and can purchase the medicine through that link. It will then be mailed to your home at the address you confirmed. If for any reason you do not receive an email from them, please check your spam folder. If you still do not find the email, please let us  know. [facility] phone number is [phone].  5-Fluorouracil /Calcipotriene Patient Education   Actinic keratoses are the dry, red scaly spots on the skin caused by sun damage. A portion of these spots can turn into skin cancer with time, and treating them can help prevent development of skin cancer.   Treatment of these spots requires removal of the defective skin cells. There are various ways to remove actinic keratoses, including freezing with liquid nitrogen, treatment with creams, or treatment with a blue light procedure in the office.   5-fluorouracil  cream is a topical cream used to treat actinic keratoses. It works by interfering with the growth of abnormal fast-growing skin cells, such as actinic keratoses. These cells peel off and are replaced by healthy ones. THIS CREAM SHOULD BE KEPT OUT OF REACH OF CHILDREN AND PETS AND SHOULD NOT BE USED BY PREGNANT WOMEN.  5-fluorouracil /calcipotriene is a combination of the 5-fluorouracil  cream with a vitamin D  analog cream called calcipotriene. The calcipotriene alone does not treat actinic keratoses. However, when it is combined with 5-fluorouracil , it helps the 5-fluorouracil  treat the actinic keratoses much faster so that the same results can be achieved with a much shorter treatment time.  INSTRUCTIONS FOR 5-FLUOROURACIL /CALCIPOTRIENE CREAM:   5-fluorouracil /calcipotriene cream typically only needs to be used for 4-7 days. A thin  layer should be applied twice a day to the treatment areas recommended by your physician.   If your physician prescribed you separate tubes of 5-fluourouracil and calcipotriene, apply a thin layer of 5-fluorouracil  followed by a thin layer of calcipotriene.   Avoid contact with your eyes or nostrils. Avoid applying the cream to your eyelids or lips unless directed to apply there by your physician. Do not use 5-fluorouracil /calcipotriene cream on infected or open wounds.   You will develop redness, irritation and some crusting at areas where you have pre-cancer damage/actinic keratoses. IF YOU DEVELOP PAIN, BLEEDING, OR SIGNIFICANT CRUSTING, STOP THE TREATMENT EARLY - you have already gotten a good response and the actinic keratoses should clear up well.  Wash your hands after applying 5-fluorouracil  5% cream on your skin.   A moisturizer or sunscreen with a minimum SPF 30 should be applied each morning.   Once you have finished the treatment, you can apply a thin layer of Vaseline twice a day to irritated areas to soothe and calm the areas more quickly. If you experience significant discomfort, contact your physician.  For some patients it is necessary to repeat the treatment for best results.  SIDE EFFECTS: When using 5-fluorouracil /calcipotriene cream, you may have mild irritation, such as redness, dryness, swelling, or a mild burning sensation. This usually resolves within 2 weeks. The more actinic keratoses you have, the more redness and inflammation you can expect during treatment. Eye irritation has been reported rarely. If this occurs, please let us  know.   If you have any trouble using this cream, please send us  a MyChart message or call the office. If  you have any other questions about this information, please do not hesitate to ask me before you leave the office or contact me on MyChart or by phone.    Due to recent changes in healthcare laws, you may see results of your pathology  and/or laboratory studies on MyChart before the doctors have had a chance to review them. We understand that in some cases there may be results that are confusing or concerning to you. Please understand that not all results are received at the same time and often the doctors may need to interpret multiple results in order to provide you with the best plan of care or course of treatment. Therefore, we ask that you please give us  2 business days to thoroughly review all your results before contacting the office for clarification. Should we see a critical lab result, you will be contacted sooner.   If You Need Anything After Your Visit  If you have any questions or concerns for your doctor, please call our main line at [phone] and press option 4 to reach your doctor's medical assistant. If no one answers, please leave a voicemail as directed and we will return your call as soon as possible. Messages left after 4 pm will be answered the following business day.   You may also send us  a message via MyChart. We typically respond to MyChart messages within 1-2 business days.  For prescription refills, please ask your pharmacy to contact our office. Our fax number is [phone].  If you have an urgent issue when the clinic is closed that cannot wait until the next business day, you can page your doctor at the number below.    Please note that while we do our best to be available for urgent issues outside of office hours, we are not available 24/7.   If you have an urgent issue and are unable to reach us , you may choose to seek medical care at your doctor's office, retail clinic, urgent care center, or emergency room.  If you have a medical emergency, please immediately call 911 or go to the emergency department.  Pager Numbers  - Dr. Bary Likes: [phone]  - Dr. Annette Barters: [phone]  - Dr. Felipe Horton: [phone]   In the event of inclement weather, please call our main line at [phone]  for an update on the status of any delays or closures.  Dermatology Medication Tips: Please keep the boxes that topical medications come in in order to help keep track of the instructions about where and how to use these. Pharmacies typically print the medication instructions only on the boxes and not directly on the medication tubes.   If your medication is too expensive, please contact our office at [phone] option 4 or send us  a message through MyChart.   We are unable to tell what your co-pay for medications will be in advance as this is different depending on your insurance coverage. However, we may be able to find a substitute medication at lower cost or fill out paperwork to get insurance to cover a needed medication.   If a prior authorization is required to get your medication covered by your insurance company, please allow us  1-2 business days to complete this process.  Drug prices often vary depending on where the prescription is filled and some pharmacies may offer cheaper prices.  The website www.goodrx.com contains coupons for medications through different pharmacies. The prices here do not account for what the cost may be with help from insurance (it  may be cheaper with your insurance), but the website can give you the price if you did not use any insurance.  - You can print the associated coupon and take it with your prescription to the pharmacy.  - You may also stop by our office during regular business hours and pick up a GoodRx coupon card.  - If you need your prescription sent electronically to a different pharmacy, notify our office through [facility] or by phone at [phone] option 4.     Si Usted Necesita Algo Despus de Su Visita  Tambin puede enviarnos un mensaje a travs de Clinical cytogeneticist. Por lo general respondemos a los mensajes de MyChart en el transcurso de 1 a 2 das hbiles.  Para renovar recetas, por favor pida a su farmacia que se ponga en contacto  con nuestra oficina. Franz Jacks de fax es [place] [phone].  Si tiene un asunto urgente cuando la clnica est cerrada y que no puede esperar hasta el siguiente da hbil, puede llamar/localizar a su doctor(a) al nmero que aparece a continuacin.   Por favor, tenga en cuenta que aunque hacemos todo lo posible para estar disponibles para asuntos urgentes fuera del horario de [place], no estamos disponibles las 24 horas del da, los 7 [address]  [address] de la [place].   Si tiene un problema urgente y no puede comunicarse con nosotros, puede optar por buscar atencin mdica  en el consultorio de su doctor(a), en una clnica privada, en un centro de atencin urgente o en una sala de emergencias.  Si tiene Engineer, drilling, por favor llame inmediatamente al 911 o vaya a la sala de emergencias.  Nmeros de bper  - Dr. Bary Likes: [phone]  - Dra. Annette Barters: [phone]  - Dr. Felipe Horton: [phone]   En caso de inclemencias del tiempo, por favor llame a Lajuan Pila principal al [phone] para una actualizacin sobre el [place] de cualquier retraso o cierre.  Consejos para la medicacin en dermatologa: Por favor, guarde las cajas en las que vienen los medicamentos de uso tpico para ayudarle a seguir las instrucciones sobre dnde y cmo usarlos. Las farmacias generalmente imprimen las instrucciones del medicamento slo en las cajas y no directamente en los tubos del [place].   Si su medicamento es muy caro, por favor, pngase en contacto con Bettyjane Brunet llamando al [phone] y presione la opcin 4 o envenos un mensaje a travs de Clinical cytogeneticist.   No podemos decirle cul ser su copago por los medicamentos por adelantado ya que esto es diferente dependiendo de la cobertura de su seguro. Sin embargo, es posible que podamos encontrar un medicamento sustituto a Audiological scientist un formulario para que el seguro cubra el medicamento que se considera necesario.   Si se requiere una autorizacin  previa para que su compaa de seguros [place] su medicamento, por favor permtanos de 1 a 2 das hbiles para completar este proceso.  Los precios de los medicamentos varan con frecuencia dependiendo del Environmental consultant de dnde se surte la receta y alguna farmacias pueden ofrecer precios ms baratos.  El sitio web www.goodrx.com tiene cupones para medicamentos de Health and safety inspector. Los precios aqu no tienen en cuenta lo que podra costar con la ayuda del seguro (puede ser ms barato con su seguro), pero el sitio web puede darle el precio si no utiliz Tourist information centre manager.  - Puede imprimir el cupn correspondiente y llevarlo con su receta a la farmacia.  - Tambin puede pasar por nuestra oficina durante el horario de atencin  regular y recoger una tarjeta de cupones de GoodRx.  - Si necesita que su receta se enve electrnicamente a una farmacia diferente, informe a nuestra oficina a travs de MyChart de [place] o por telfono llamando al [phone] y presione la opcin 4.

## 2024-02-13 NOTE — Progress Notes (Signed)
 Follow-Up Visit   Subjective  Grant Diaz is a 73 y.o. male who presents for the following: Skin Cancer Screening and Full Body Skin Exam  The patient presents for Total-Body Skin Exam (TBSE) for skin cancer screening and mole check. The patient has spots, moles and lesions to be evaluated, some may be new or changing and the patient may have concern these could be cancer.  The following portions of the chart were reviewed this encounter and updated as appropriate: medications, allergies, medical history  Review of Systems:  No other skin or systemic complaints except as noted in HPI or Assessment and Plan.  Objective  Well appearing patient in no apparent distress; mood and affect are within normal limits.  A full examination was performed including scalp, head, eyes, ears, nose, lips, neck, chest, axillae, abdomen, back, buttocks, bilateral upper extremities, bilateral lower extremities, hands, feet, fingers, toes, fingernails, and toenails. All findings within normal limits unless otherwise noted below.   Relevant physical exam findings are noted in the Assessment and Plan.  Scalp and face x 25 (25) Erythematous thin papules/macules with gritty scale.  Back x 4 (4) Erythematous stuck-on, waxy papule or plaque  Assessment & Plan   SKIN CANCER SCREENING PERFORMED TODAY.  ACTINIC DAMAGE WITH PRECANCEROUS ACTINIC KERATOSES Counseling for Topical Chemotherapy Management: Patient exhibits: - Severe, confluent actinic changes with pre-cancerous actinic keratoses that is secondary to cumulative UV radiation exposure over time - Condition that is severe; chronic, not at goal. - diffuse scaly erythematous macules and papules with underlying dyspigmentation - Discussed Prescription "Field Treatment" topical Chemotherapy for Severe, Chronic Confluent Actinic Changes with Pre-Cancerous Actinic Keratoses Field treatment involves treatment of an entire area of skin that has confluent  Actinic Changes (Sun/ Ultraviolet light damage) and PreCancerous Actinic Keratoses by method of PhotoDynamic Therapy (PDT) and/or prescription Topical Chemotherapy agents such as 5-fluorouracil , 5-fluorouracil /calcipotriene, and/or imiquimod.  The purpose is to decrease the number of clinically evident and subclinical PreCancerous lesions to prevent progression to development of skin cancer by chemically destroying early precancer changes that may or may not be visible.  It has been shown to reduce the risk of developing skin cancer in the treated area. As a result of treatment, redness, scaling, crusting, and open sores may occur during treatment course. One or more than one of these methods may be used and may have to be used several times to control, suppress and eliminate the PreCancerous changes. Discussed treatment course, expected reaction, and possible side effects. - Recommend daily broad spectrum sunscreen SPF 30+ to sun-exposed areas, reapply every 2 hours as needed.  - Staying in the shade or wearing long sleeves, sun glasses (UVA+UVB protection) and wide brim hats (4-inch brim around the entire circumference of the hat) are also recommended. - Call for new or changing lesions.  - In one month (June 1st) start 5FU/Calcipotriene mix BID x 7 days to the top of the scalp, forehead, and temples.  LENTIGINES, SEBORRHEIC KERATOSES, HEMANGIOMAS - Benign normal skin lesions - Benign-appearing - Call for any changes  MELANOCYTIC NEVI - Tan-brown and/or pink-flesh-colored symmetric macules and papules - Benign appearing on exam today - Observation - Call clinic for new or changing moles - Recommend daily use of broad spectrum spf 30+ sunscreen to sun-exposed areas.   [place] Exam: red or purple papule at R ear sup helix Treatment Plan: Benign-appearing. Observe   AK (ACTINIC KERATOSIS) (25) Scalp and face x 25 (25) Actinic keratoses are precancerous spots that  appear secondary to  cumulative UV radiation exposure/sun exposure over time. They are chronic with expected duration over 1 year. A portion of actinic keratoses will progress to squamous cell carcinoma of the skin. It is not possible to reliably predict which spots will progress to skin cancer and so treatment is recommended to prevent development of skin cancer.  Recommend daily broad spectrum sunscreen SPF 30+ to sun-exposed areas, reapply every 2 hours as needed.  Recommend staying in the shade or wearing long sleeves, sun glasses (UVA+UVB protection) and wide brim hats (4-inch brim around the entire circumference of the hat). Call for new or changing lesions.  Destruction of lesion - Scalp and face x 25 (25) Complexity: simple   Destruction method: cryotherapy   Informed consent: discussed and consent obtained   Timeout:  patient name, date of birth, surgical site, and procedure verified Lesion destroyed using liquid nitrogen: Yes   Region frozen until ice ball extended beyond lesion: Yes   Outcome: patient tolerated procedure well with no complications   Post-procedure details: wound care instructions given   INFLAMED SEBORRHEIC KERATOSIS (4) Back x 4 (4) Symptomatic, irritating, patient would like treated.  Destruction of lesion - Back x 4 (4) Complexity: simple   Destruction method: cryotherapy   Informed consent: discussed and consent obtained   Timeout:  patient name, date of birth, surgical site, and procedure verified Lesion destroyed using liquid nitrogen: Yes   Region frozen until ice ball extended beyond lesion: Yes   Outcome: patient tolerated procedure well with no complications   Post-procedure details: wound care instructions given    Varicose Veins/Spider Veins - Dilated blue, purple or red veins at the lower extremities - Reassured - Smaller vessels can be treated by sclerotherapy (a procedure to inject a medicine into the veins to make them disappear) if desired, but the treatment is  not covered by insurance. Larger vessels may be covered if symptomatic and we would refer to vascular surgeon if treatment desired.  STASIS DERMATITIS Exam: Erythematous, scaly patches involving the ankle and distal lower leg with associated lower leg edema. Chronic and persistent condition with duration or expected duration over one year. Condition is symptomatic/ bothersome to patient. Not currently at goal. Stasis in the legs causes chronic leg swelling, which may result in itchy or painful rashes, skin discoloration, skin texture changes, and sometimes ulceration.  Recommend daily graduated compression hose/stockings- easiest to put on first thing in morning, remove at bedtime.  Elevate legs as much as possible. Avoid salt/sodium rich foods. Treatment Plan: Mild, none needed.   Return in about 6 months (around 08/14/2024) for AK follow up.  Arlinda Lais, CMA, am acting as scribe for Celine Collard, MD .   Documentation: I have reviewed the above documentation for accuracy and completeness, and I agree with the above.  Celine Collard, MD

## 2024-02-26 ENCOUNTER — Ambulatory Visit

## 2024-02-26 DIAGNOSIS — E782 Mixed hyperlipidemia: Secondary | ICD-10-CM

## 2024-02-26 DIAGNOSIS — I480 Paroxysmal atrial fibrillation: Secondary | ICD-10-CM

## 2024-02-26 DIAGNOSIS — I340 Nonrheumatic mitral (valve) insufficiency: Secondary | ICD-10-CM | POA: Diagnosis not present

## 2024-02-26 DIAGNOSIS — G4733 Obstructive sleep apnea (adult) (pediatric): Secondary | ICD-10-CM

## 2024-02-26 DIAGNOSIS — I351 Nonrheumatic aortic (valve) insufficiency: Secondary | ICD-10-CM

## 2024-02-26 DIAGNOSIS — I10 Essential (primary) hypertension: Secondary | ICD-10-CM

## 2024-03-02 ENCOUNTER — Other Ambulatory Visit: Payer: Self-pay | Admitting: Cardiovascular Disease

## 2024-03-02 DIAGNOSIS — E782 Mixed hyperlipidemia: Secondary | ICD-10-CM

## 2024-03-02 DIAGNOSIS — G4733 Obstructive sleep apnea (adult) (pediatric): Secondary | ICD-10-CM

## 2024-03-02 DIAGNOSIS — I10 Essential (primary) hypertension: Secondary | ICD-10-CM

## 2024-03-02 DIAGNOSIS — I480 Paroxysmal atrial fibrillation: Secondary | ICD-10-CM

## 2024-03-02 DIAGNOSIS — I340 Nonrheumatic mitral (valve) insufficiency: Secondary | ICD-10-CM

## 2024-03-14 ENCOUNTER — Encounter: Payer: Self-pay | Admitting: Nurse Practitioner

## 2024-03-18 ENCOUNTER — Ambulatory Visit: Admitting: Cardiovascular Disease

## 2024-03-20 ENCOUNTER — Ambulatory Visit: Admitting: Cardiovascular Disease

## 2024-03-20 ENCOUNTER — Encounter: Payer: Self-pay | Admitting: Cardiovascular Disease

## 2024-03-20 DIAGNOSIS — I480 Paroxysmal atrial fibrillation: Secondary | ICD-10-CM

## 2024-03-20 DIAGNOSIS — I340 Nonrheumatic mitral (valve) insufficiency: Secondary | ICD-10-CM | POA: Diagnosis not present

## 2024-03-20 DIAGNOSIS — E782 Mixed hyperlipidemia: Secondary | ICD-10-CM | POA: Diagnosis not present

## 2024-03-20 DIAGNOSIS — I10 Essential (primary) hypertension: Secondary | ICD-10-CM | POA: Diagnosis not present

## 2024-03-20 DIAGNOSIS — G4733 Obstructive sleep apnea (adult) (pediatric): Secondary | ICD-10-CM

## 2024-03-20 MED ORDER — AMIODARONE HCL 200 MG PO TABS: 200.0000 mg | ORAL_TABLET | Freq: Every day | ORAL | 11 refills | Status: AC

## 2024-03-20 NOTE — Progress Notes (Signed)
 Cardiology Office Note   Date:  03/20/2024   ID:  Grant Diaz, DOB 06/27/1951, MRN [id number]  PCP:  Lemar Pyles, NP  Cardiologist:  Debborah Fairly, MD      History of Present Illness: Grant Diaz is a 73 y.o. male who presents for  Chief Complaint  Patient presents with   Follow-up    1 month echo results    HPI    Past Medical History:  Diagnosis Date   Arthritis    Atrial fibrillation (HCC)    Barrett's esophagus    Cancer (HCC) 2008   Prostate-radiation   Cataract 2019   not yet significant enough to warrant surgery   Dysrhythmia    Afib   GERD (gastroesophageal reflux disease)    History of DVT (deep vein thrombosis)    History of hiatal hernia    History of kidney stones    Hypertension    Joint pain    Mixed hyperlipidemia    Pancreatitis 2013   [facility]   Prostate cancer [facility])    Pulmonary embolus (HCC) 12/2014   Bilateral pulmonary embolus, [facility]   Sleep apnea 2010   use CPAP regularly   Sleep apnea, obstructive    cpap     Past Surgical History:  Procedure Laterality Date   BIOPSY  06/12/2023   Procedure: BIOPSY;  Surgeon: Luke Salaam, MD;  Location: [facility] ENDOSCOPY;  Service: Gastroenterology;;   Ritta Chessman RELEASE Right 03/15/2017   Procedure: CARPAL TUNNEL RELEASE;  Surgeon: Molli Angelucci, MD;  Location: [facility] ORS;  Service: Orthopedics;  Laterality: Right;   CHOLECYSTECTOMY     COLONOSCOPY WITH PROPOFOL  N/A 04/02/2018   Procedure: COLONOSCOPY WITH PROPOFOL ;  Surgeon: Marnee Sink, MD;  Location: [facility] ENDOSCOPY;  Service: Endoscopy;  Laterality: N/A;   COLONOSCOPY WITH PROPOFOL  N/A 06/12/2023   Procedure: COLONOSCOPY WITH PROPOFOL ;  Surgeon: Luke Salaam, MD;  Location: [facility] ENDOSCOPY;  Service: Gastroenterology;  Laterality: N/A;   ESOPHAGOGASTRODUODENOSCOPY N/A 05/28/2013   Procedure: ESOPHAGOGASTRODUODENOSCOPY (EGD);  Surgeon: Thayne Fine, MD;  Location: [facility] ENDOSCOPY;  Service: General;  Laterality: N/A;    ESOPHAGOGASTRODUODENOSCOPY (EGD) WITH PROPOFOL  N/A 02/08/2021   Procedure: ESOPHAGOGASTRODUODENOSCOPY (EGD) WITH PROPOFOL ;  Surgeon: Irby Mannan, MD;  Location: [facility] ENDOSCOPY;  Service: Endoscopy;  Laterality: N/A;   ESOPHAGOGASTRODUODENOSCOPY (EGD) WITH PROPOFOL  N/A 06/12/2023   Procedure: ESOPHAGOGASTRODUODENOSCOPY (EGD) WITH PROPOFOL ;  Surgeon: Luke Salaam, MD;  Location: [facility] ENDOSCOPY;  Service: Gastroenterology;  Laterality: N/A;   GALLBLADDER SURGERY  2003   GANGLION CYST EXCISION Right 03/15/2017   Procedure: REMOVAL GANGLION OF WRIST;  Surgeon: Molli Angelucci, MD;  Location: [facility] ORS;  Service: Orthopedics;  Laterality: Right;   HERNIA REPAIR     HIATAL HERNIA REPAIR N/A 05/31/2021   Procedure: LAPAROSCOPIC REPAIR OF HIATAL HERNIA WITH LAP BAND REMOVAL;  Surgeon: Kinsinger, Alphonso Aschoff, MD;  Location: [facility] ORS;  Service: General;  Laterality: N/A;   JOINT REPLACEMENT     KNEE ARTHROSCOPY Bilateral [id number]   LAPAROSCOPIC GASTRIC BANDING  2009   and hernia repair   LAPAROSCOPIC REPAIR OF HIATAL HERNIA WITH LAP BAND REMOVAL   05/31/2021   POLYPECTOMY  06/12/2023   Procedure: POLYPECTOMY;  Surgeon: Luke Salaam, MD;  Location: [facility] ENDOSCOPY;  Service: Gastroenterology;;   RADIATION IMPLANT W/ ULTRASOUND, MALE  2005   internal/external radiation for prostate cancer   TOTAL KNEE ARTHROPLASTY Left 10/12/2015   Procedure: TOTAL KNEE ARTHROPLASTY;  Surgeon: Molli Angelucci, MD;  Location: [facility] ORS;  Service: Orthopedics;  Laterality: Left;   TOTAL KNEE ARTHROPLASTY Right 05/15/2017   Procedure: TOTAL KNEE ARTHROPLASTY;  Surgeon: Molli Angelucci, MD;  Location: [facility] ORS;  Service: Orthopedics;  Laterality: Right;   UMBILICAL HERNIA REPAIR  2010     Current Outpatient Medications  Medication Sig Dispense Refill   amiodarone  (PACERONE ) 200 MG tablet Take 1 tablet (200 mg total) by mouth daily. 30 tablet 11   acetaminophen  (TYLENOL ) 325 MG tablet Take 650 mg by mouth every 6 (six) hours as  needed for mild pain.     amLODipine  (NORVASC ) 10 MG tablet Take 1 tablet (10 mg total) by mouth daily. 90 tablet 4   celecoxib  (CELEBREX ) 100 MG capsule TAKE 1 CAPSULE BY MOUTH ONCE DAILY AS NEEDED .USE  MINIMALLY  SINCE  TAKING  XARELTO  90 capsule 0   Cholecalciferol  25 MCG (1000 UT) capsule Take 1,000 Units by mouth daily.     fluorouracil  (EFUDEX ) 5 % cream Apply topically 2 (two) times daily. Apply to the top of scalp, forehead, and temples BID x 7 days 30 g 0   ketoconazole  (NIZORAL ) 2 % cream Qhs to aa rash groin prn flares also use to both feet nightly for tinea pedis 60 g 11   lisinopril -hydrochlorothiazide  (ZESTORETIC ) 20-25 MG tablet Take 1 tablet by mouth daily. 90 tablet 4   Multiple Vitamin (MULTIVITAMIN) capsule Take 1 capsule by mouth daily.     omeprazole  (PRILOSEC) 40 MG capsule Take 1 capsule (40 mg total) by mouth 2 (two) times daily. 60 capsule 11   rosuvastatin  (CRESTOR ) 10 MG tablet Take one tablet (10 MG) by mouth three times a week. 36 tablet 4   triamcinolone  cream (KENALOG ) 0.1 % Apply 1 Application topically as directed. Qd up to 5 days per week to aa eczema on body until clear, avoid face, groin, axilla 80 g 6   XARELTO  20 MG TABS tablet Take 1 tablet by mouth once daily 30 tablet 0   No current facility-administered medications for this visit.    Allergies:   Adhesive [tape]    Social History:   reports that he has never smoked. He has never used smokeless tobacco. He reports that he does not currently use alcohol. He reports that he does not use drugs.   Family History:  family history includes Arthritis in his brother and mother; Asthma in his brother; Diabetes in his mother and son; Heart disease in his maternal grandmother and paternal grandmother; Hypertension in his brother; Obesity in his mother; Parkinson's disease in his father; Stroke in his maternal grandfather and paternal grandfather.    ROS:     Review of Systems  Constitutional: Negative.    HENT: Negative.    Eyes: Negative.   Respiratory: Negative.    Gastrointestinal: Negative.   Genitourinary: Negative.   Musculoskeletal: Negative.   Skin: Negative.   Neurological: Negative.   Endo/Heme/Allergies: Negative.   Psychiatric/Behavioral: Negative.    All other systems reviewed and are negative.     All other systems are reviewed and negative.    PHYSICAL EXAM: VS:  BP 114/60   Pulse 61   Ht 5\' 9"  (1.753 m)   Wt 258 lb 3.2 oz (117.1 kg)   SpO2 98%   BMI 38.13 kg/m  , BMI Body mass index is 38.13 kg/m. Last weight:  Wt Readings from Last 3 Encounters:  03/20/24 258 lb 3.2 oz (117.1 kg)  02/12/24 260 lb (117.9 kg)  11/27/23 256 lb 9.6 oz (116.4 kg)  Physical Exam Vitals reviewed.  Constitutional:      Appearance: Normal appearance. He is normal weight.  HENT:     Head: Normocephalic.     Nose: Nose normal.     Mouth/Throat:     Mouth: Mucous membranes are moist.  Eyes:     Pupils: Pupils are equal, round, and reactive to light.  Cardiovascular:     Rate and Rhythm: Normal rate and regular rhythm.     Pulses: Normal pulses.     Heart sounds: Normal heart sounds.  Pulmonary:     Effort: Pulmonary effort is normal.  Abdominal:     General: Abdomen is flat. Bowel sounds are normal.  Musculoskeletal:        General: Normal range of motion.     Cervical back: Normal range of motion.  Skin:    General: Skin is warm.  Neurological:     General: No focal deficit present.     Mental Status: He is alert.  Psychiatric:        Mood and Affect: Mood normal.       EKG:   Recent Labs: 11/27/2023: ALT 12; BUN 23; Creatinine, Ser 1.04; Hemoglobin 13.1; Magnesium  1.8; Platelets 190; Potassium 3.5; Sodium 142; TSH 1.400    Lipid Panel    Component Value Date/Time   CHOL 133 11/27/2023 1600   TRIG 104 11/27/2023 1600   HDL 43 11/27/2023 1600   LDLCALC 71 11/27/2023 1600      Other studies Reviewed: Additional studies/ records that were  reviewed today include:  Review of the above records demonstrates:      01/21/2018    4:36 PM  PAD Screen  Previous PAD dx? No  Previous surgical procedure? No  Pain with walking? No  Feet/toe relief with dangling? No  Painful, non-healing ulcers? No  Extremities discolored? No      ASSESSMENT AND PLAN:    ICD-10-CM   1. Paroxysmal atrial fibrillation (HCC)  I48.0 amiodarone  (PACERONE ) 200 MG tablet   In afib and is taking xarelto  for stoke prevention., start amiodrone, now 1 tab daily    2. Essential hypertension, benign  I10 amiodarone  (PACERONE ) 200 MG tablet    3. Mixed hyperlipidemia  E78.2 amiodarone  (PACERONE ) 200 MG tablet    4. Nonrheumatic mitral valve regurgitation  I34.0 amiodarone  (PACERONE ) 200 MG tablet   trace    5. Obstructive sleep apnea syndrome  G47.33 amiodarone  (PACERONE ) 200 MG tablet       Problem List Items Addressed This Visit       Cardiovascular and Mediastinum   Essential hypertension, benign   Relevant Medications   amiodarone  (PACERONE ) 200 MG tablet   Atrial fibrillation (HCC)   Relevant Medications   amiodarone  (PACERONE ) 200 MG tablet     Respiratory   Sleep apnea   Relevant Medications   amiodarone  (PACERONE ) 200 MG tablet     Other   Mixed hyperlipidemia   Relevant Medications   amiodarone  (PACERONE ) 200 MG tablet   Other Visit Diagnoses       Nonrheumatic mitral valve regurgitation       trace   Relevant Medications   amiodarone  (PACERONE ) 200 MG tablet          Disposition:   Return in about 3 months (around 06/20/2024).    Total time spent: 30 minutes  Signed,  Debborah Fairly, MD  03/20/2024 10:35 AM    [facility]

## 2024-04-05 NOTE — Patient Instructions (Incomplete)
 Rash, Adult  A rash is a breakout of spots or blotches on the skin. It can change the way your skin looks and feels. Many things can cause a rash. The goal of treatment is to stop the itching and keep the rash from spreading. Follow these instructions at home: Medicine Take or apply over-the-counter and prescription medicines only as told by your doctor. These may include medicines to treat: Red or swollen skin. Itching. An allergy. Pain. An infection.  Skin care Put a cool, wet cloth on the rash. Do not scratch or rub your skin. Try not to cover the rash. Keep it exposed to air as often as you can. Managing itching and discomfort Avoid hot showers or baths. These can make itching worse. A cold shower may help. Try taking a bath with: Epsom salts. You can get these at your pharmacy or grocery store. Follow the instructions on the package. Baking soda. Pour a small amount into the bath as told by your doctor. Colloidal oatmeal. You can get this at your pharmacy or grocery store. Follow the instructions on the package. Try putting baking soda paste on your skin. Stir water into baking soda until it gets like a paste. Try putting on a lotion to help with itching (calamine lotion). Keep cool. Stay out of the sun. Sweating and being hot can make itching worse. General instructions  Rest as needed. Drink enough fluid to keep your pee (urine) pale yellow. Wear loose-fitting clothes. Avoid scented soaps, detergents, and perfumes. Use gentle soaps, detergents, perfumes, and cosmetics. Avoid the things that cause your rash. Keep a journal to help keep track of what causes your rash. Write down: What you eat. What cosmetics you use. What you drink. What you wear. This includes jewelry. Contact a doctor if: You sweat a lot at night. You pee (urinate) more or less than normal. Your pee is a darker color than normal. Your eyes are sensitive to light. Your skin or the white parts of your  eyes turn yellow. Your skin tingles or is numb. You get painful blisters in your nose or mouth. Your rash does not go away after a few days, or it gets worse. You are more tired than normal. You are more thirsty than normal. You have new or worse symptoms. These may include: Pain in your belly. A fever. Watery poop (diarrhea). Vomiting. Weakness. Weight loss. Get help right away if: You start to feel mixed up (confused). You have a very bad headache or a stiff neck. You have very bad joint pain or stiffness. You get very sleepy or not responsive. You have a seizure. This information is not intended to replace advice given to you by your health care provider. Make sure you discuss any questions you have with your health care provider. Document Revised: 07/28/2022 Document Reviewed: 07/28/2022 Elsevier Patient Education  2024 [employer].

## 2024-04-10 ENCOUNTER — Ambulatory Visit: Admitting: Nurse Practitioner

## 2024-04-27 ENCOUNTER — Other Ambulatory Visit: Payer: Self-pay | Admitting: Cardiology

## 2024-04-27 DIAGNOSIS — G4733 Obstructive sleep apnea (adult) (pediatric): Secondary | ICD-10-CM

## 2024-04-27 DIAGNOSIS — I10 Essential (primary) hypertension: Secondary | ICD-10-CM

## 2024-04-27 DIAGNOSIS — I480 Paroxysmal atrial fibrillation: Secondary | ICD-10-CM

## 2024-04-27 DIAGNOSIS — E782 Mixed hyperlipidemia: Secondary | ICD-10-CM

## 2024-04-27 DIAGNOSIS — I340 Nonrheumatic mitral (valve) insufficiency: Secondary | ICD-10-CM

## 2024-05-08 ENCOUNTER — Ambulatory Visit (INDEPENDENT_AMBULATORY_CARE_PROVIDER_SITE_OTHER): Payer: Self-pay | Admitting: Emergency Medicine

## 2024-05-08 VITALS — Ht 66.5 in | Wt 247.0 lb

## 2024-05-08 DIAGNOSIS — Z0000 Encounter for general adult medical examination without abnormal findings: Secondary | ICD-10-CM

## 2024-05-08 NOTE — Progress Notes (Signed)
 Subjective:   Grant Diaz is a 73 y.o. who presents for a Medicare Wellness preventive visit.  As a reminder, Annual Wellness Visits don't include a physical exam, and some assessments may be limited, especially if this visit is performed virtually. We may recommend an in-person follow-up visit with your provider if needed.  Visit Complete: Virtual I connected with  Grant Diaz on 05/08/24 by a audio enabled telemedicine application and verified that I am speaking with the correct person using two identifiers.  Patient Location: Home  Provider Location: Home Office  I discussed the limitations of evaluation and management by telemedicine. The patient expressed understanding and agreed to proceed.  Vital Signs: Because this visit was a virtual/telehealth visit, some criteria may be missing or patient reported. Any vitals not documented were not able to be obtained and vitals that have been documented are patient reported.  VideoDeclined- This patient declined Librarian, academic. Therefore the visit was completed with audio only.  Persons Participating in Visit: Patient.  AWV Questionnaire: No: Patient Medicare AWV questionnaire was not completed prior to this visit.  Cardiac Risk Factors include: advanced age (>48men, >70 women);male gender;hypertension;dyslipidemia;obesity (BMI >30kg/m2)     Objective:    Today's Vitals   05/08/24 1445  Weight: 247 lb (112 kg)  Height: 5' 6.5 (1.689 m)   Body mass index is 39.27 kg/m.     05/08/2024    3:02 PM 06/12/2023    9:05 AM 04/23/2023    3:58 PM 04/18/2022    1:13 PM 05/31/2021   12:18 PM 05/26/2021    1:26 PM 04/15/2021    8:18 AM  Advanced Directives  Does Patient Have a Medical Advance Directive? Yes Yes Yes No No Yes Yes  Type of Estate agent of [place];Living will Healthcare Power of [place];Living will Healthcare Power of [place];Living will   Living will;Healthcare Power  of [employer] Power of [place];Living will  Does patient want to make changes to medical advance directive? No - Patient declined  No - Patient declined  No - Patient declined No - Patient declined   Copy of Healthcare Power of Attorney in Chart? Yes - validated most recent copy scanned in chart (See row information) No - copy requested Yes - validated most recent copy scanned in chart (See row information)   No - copy requested No - copy requested  Would patient like information on creating a medical advance directive?    No - Patient declined No - Patient declined      Current Medications (verified) Outpatient Encounter Medications as of 05/08/2024  Medication Sig   acetaminophen  (TYLENOL ) 325 MG tablet Take 650 mg by mouth every 6 (six) hours as needed for mild pain.   amiodarone  (PACERONE ) 200 MG tablet Take 1 tablet (200 mg total) by mouth daily.   amLODipine  (NORVASC ) 10 MG tablet Take 1 tablet (10 mg total) by mouth daily.   celecoxib  (CELEBREX ) 100 MG capsule TAKE 1 CAPSULE BY MOUTH ONCE DAILY AS NEEDED .USE  MINIMALLY  SINCE  TAKING  XARELTO    Cholecalciferol  25 MCG (1000 UT) capsule Take 1,000 Units by mouth daily.   ketoconazole  (NIZORAL ) 2 % cream Qhs to aa rash groin prn flares also use to both feet nightly for tinea pedis   lisinopril -hydrochlorothiazide  (ZESTORETIC ) 20-25 MG tablet Take 1 tablet by mouth daily.   Multiple Vitamin (MULTIVITAMIN) capsule Take 1 capsule by mouth daily.   omeprazole  (PRILOSEC) 40 MG capsule Take 1  capsule (40 mg total) by mouth 2 (two) times daily.   rosuvastatin  (CRESTOR ) 10 MG tablet Take one tablet (10 MG) by mouth three times a week.   triamcinolone  cream (KENALOG ) 0.1 % Apply 1 Application topically as directed. Qd up to 5 days per week to aa eczema on body until clear, avoid face, groin, axilla   XARELTO  20 MG TABS tablet Take 1 tablet by mouth once daily   fluorouracil  (EFUDEX ) 5 % cream Apply topically 2 (two) times daily. Apply to  the top of scalp, forehead, and temples BID x 7 days (Patient not taking: Reported on 05/08/2024)   No facility-administered encounter medications on file as of 05/08/2024.    Allergies (verified) Adhesive [tape]   History: Past Medical History:  Diagnosis Date   Arthritis    Atrial fibrillation (HCC)    Barrett's esophagus    Cancer (HCC) 2008   Prostate-radiation   Cataract 2019   not yet significant enough to warrant surgery   Dysrhythmia    Afib   GERD (gastroesophageal reflux disease)    History of DVT (deep vein thrombosis)    History of hiatal hernia    History of kidney stones    Hypertension    controlled w/meds   Joint pain    Mixed hyperlipidemia    Pancreatitis 2013   [facility]   Prostate cancer [facility])    Pulmonary embolus (HCC) 12/2014   Bilateral pulmonary embolus, [facility]   Sleep apnea 2010   use CPAP regularly   Sleep apnea, obstructive    cpap   Past Surgical History:  Procedure Laterality Date   BIOPSY  06/12/2023   Procedure: BIOPSY;  Surgeon: Therisa Bi, MD;  Location: [facility] ENDOSCOPY;  Service: Gastroenterology;;   ORIN MEDIATE RELEASE Right 03/15/2017   Procedure: CARPAL TUNNEL RELEASE;  Surgeon: Kathlynn Sharper, MD;  Location: [facility] ORS;  Service: Orthopedics;  Laterality: Right;   CHOLECYSTECTOMY     COLONOSCOPY WITH PROPOFOL  N/A 04/02/2018   Procedure: COLONOSCOPY WITH PROPOFOL ;  Surgeon: Jinny Carmine, MD;  Location: [facility] ENDOSCOPY;  Service: Endoscopy;  Laterality: N/A;   COLONOSCOPY WITH PROPOFOL  N/A 06/12/2023   Procedure: COLONOSCOPY WITH PROPOFOL ;  Surgeon: Therisa Bi, MD;  Location: [facility] ENDOSCOPY;  Service: Gastroenterology;  Laterality: N/A;   ESOPHAGOGASTRODUODENOSCOPY N/A 05/28/2013   Procedure: ESOPHAGOGASTRODUODENOSCOPY (EGD);  Surgeon: Alm VEAR Angle, MD;  Location: [facility] ENDOSCOPY;  Service: General;  Laterality: N/A;   ESOPHAGOGASTRODUODENOSCOPY (EGD) WITH PROPOFOL  N/A 02/08/2021   Procedure: ESOPHAGOGASTRODUODENOSCOPY (EGD) WITH PROPOFOL ;   Surgeon: Janalyn Keene NOVAK, MD;  Location: [facility] ENDOSCOPY;  Service: Endoscopy;  Laterality: N/A;   ESOPHAGOGASTRODUODENOSCOPY (EGD) WITH PROPOFOL  N/A 06/12/2023   Procedure: ESOPHAGOGASTRODUODENOSCOPY (EGD) WITH PROPOFOL ;  Surgeon: Therisa Bi, MD;  Location: [facility] ENDOSCOPY;  Service: Gastroenterology;  Laterality: N/A;   GALLBLADDER SURGERY  2003   GANGLION CYST EXCISION Right 03/15/2017   Procedure: REMOVAL GANGLION OF WRIST;  Surgeon: Kathlynn Sharper, MD;  Location: [facility] ORS;  Service: Orthopedics;  Laterality: Right;   HERNIA REPAIR     HIATAL HERNIA REPAIR N/A 05/31/2021   Procedure: LAPAROSCOPIC REPAIR OF HIATAL HERNIA WITH LAP BAND REMOVAL;  Surgeon: Stevie Herlene Righter, MD;  Location: [facility] ORS;  Service: General;  Laterality: N/A;   JOINT REPLACEMENT     KNEE ARTHROSCOPY Bilateral [id number]   LAPAROSCOPIC GASTRIC BANDING  2009   and hernia repair   LAPAROSCOPIC REPAIR OF HIATAL HERNIA WITH LAP BAND REMOVAL   05/31/2021   POLYPECTOMY  06/12/2023   Procedure: POLYPECTOMY;  Surgeon:  Therisa Bi, MD;  Location: [facility] ENDOSCOPY;  Service: Gastroenterology;;   RADIATION IMPLANT W/ ULTRASOUND, MALE  2005   internal/external radiation for prostate cancer   TOTAL KNEE ARTHROPLASTY Left 10/12/2015   Procedure: TOTAL KNEE ARTHROPLASTY;  Surgeon: Ozell Flake, MD;  Location: [facility] ORS;  Service: Orthopedics;  Laterality: Left;   TOTAL KNEE ARTHROPLASTY Right 05/15/2017   Procedure: TOTAL KNEE ARTHROPLASTY;  Surgeon: Flake Ozell, MD;  Location: [facility] ORS;  Service: Orthopedics;  Laterality: Right;   UMBILICAL HERNIA REPAIR  2010   Family History  Problem Relation Age of Onset   Diabetes Mother    Arthritis Mother    Obesity Mother    Parkinson's disease Father    Hypertension Brother    Arthritis Brother    Asthma Brother    Diabetes Son        type 1   Heart disease Maternal Grandmother        CHF   Stroke Maternal Grandfather    Heart disease Paternal Grandmother        CHF    Stroke Paternal Grandfather    Social History   Socioeconomic History   Marital status: Married    Spouse name: Mercer   Number of children: 2   Years of education: Not on file   Highest education level: Not on file  Occupational History   Occupation: pastor   Tobacco Use   Smoking status: Never   Smokeless tobacco: Never  Vaping Use   Vaping status: Never Used  Substance and Sexual Activity   Alcohol use: Not Currently    Comment: quit using alcohol upon entering ministry 1977   Drug use: Never   Sexual activity: Not Currently    Birth control/protection: None  Other Topics Concern   Not on file  Social History Narrative   Not on file   Social Drivers of Health   Financial Resource Strain: Low Risk  (05/08/2024)   Overall Financial Resource Strain (CARDIA)    Difficulty of Paying Living Expenses: Not hard at all  Food Insecurity: No Food Insecurity (05/08/2024)   Hunger Vital Sign    Worried About Running Out of Food in the Last Year: Never true    Ran Out of Food in the Last Year: Never true  Transportation Needs: No Transportation Needs (05/08/2024)   PRAPARE - Administrator, Civil Service (Medical): No    Lack of Transportation (Non-Medical): No  Physical Activity: Sufficiently Active (05/08/2024)   Exercise Vital Sign    Days of Exercise per Week: 7 days    Minutes of Exercise per Session: 60 min  Stress: No Stress Concern Present (05/08/2024)   [employer] of Occupational Health - Occupational Stress Questionnaire    Feeling of Stress: Not at all  Social Connections: Moderately Integrated (05/08/2024)   Social Connection and Isolation Panel    Frequency of Communication with Friends and Family: More than three times a week    Frequency of Social Gatherings with Friends and Family: More than three times a week    Attends Religious Services: More than 4 times per year    Active Member of [employer] or Organizations: No    Attends Banker  Meetings: Never    Marital Status: Married    Tobacco Counseling Counseling given: Not Answered    Clinical Intake:  Pre-visit preparation completed: Yes  Pain : No/denies pain     BMI - recorded: 39.27 Nutritional Status: BMI > 30  Obese  Nutritional Risks: None Diabetes: No  Lab Results  Component Value Date   HGBA1C 5.3 08/01/2021     How often do you need to have someone help you when you read instructions, pamphlets, or other written materials from your doctor or pharmacy?: 1 - Never  Interpreter Needed?: No  Information entered by :: Vina Ned, CMA   Activities of Daily Living     05/08/2024    2:48 PM  In your present state of health, do you have any difficulty performing the following activities:  Hearing? 1  Comment some, but declines referral to ENT for evaluation  Vision? 0  Difficulty concentrating or making decisions? 0  Walking or climbing stairs? 1  Comment uses cane prn on uneven terrain and wears [place]  brace on left ankle  Dressing or bathing? 0  Doing errands, shopping? 0  Preparing Food and eating ? N  Using the Toilet? N  In the past six months, have you accidently leaked urine? Y  Comment tiny bit,only under stress  Do you have problems with loss of bowel control? N  Managing your Medications? N  Managing your Finances? N  Housekeeping or managing your Housekeeping? N    Patient Care Team: Cannady, Jolene T, NP as PCP - General (Nurse Practitioner) Odetta, A. Robynn, MD (Ophthalmology) Hester Alm BROCKS, MD (Dermatology) Fernand Denyse LABOR, MD as Consulting Physician (Cardiology) Cloria Banks, MD as Referring Physician (Gastroenterology)  I have updated your Care Teams any recent Medical Services you may have received from other providers in the past year.     Assessment:   This is a routine wellness examination for Grant Diaz.  Hearing/Vision screen Hearing Screening - Comments:: some, but declines referral to ENT for  evaluation Vision Screening - Comments:: Gets routine eye exams, Dr. Robynn Odetta, [place], [place]   Goals Addressed               This Visit's Progress     COMPLETED: DIET - EAT MORE FRUITS AND VEGETABLES        COMPLETED: Patient Stated        04/15/2021, possibly get bariatric surgery      COMPLETED: Weight (lb) < 200 lb (90.7 kg)   247 lb (112 kg)     Would like to loose weight      Weight (lb) < 200 lb (90.7 kg) (pt-stated)   247 lb (112 kg)      Depression Screen     05/08/2024    3:00 PM 11/27/2023    3:20 PM 04/23/2023    3:56 PM 12/15/2022   10:45 AM 11/07/2022    3:22 PM 05/25/2022   10:47 AM 04/18/2022    1:07 PM  PHQ 2/9 Scores  PHQ - 2 Score 0 0 0 0 0 0 0  PHQ- 9 Score 0 1 0 1 0 1     Fall Risk     05/08/2024    3:05 PM 11/27/2023    3:20 PM 04/23/2023    3:59 PM 04/23/2023    9:47 AM 12/15/2022   10:44 AM  Fall Risk   Falls in the past year? 1 0 0 0 0  Number falls in past yr: 0 0 0 0 0  Injury with Fall? 0 0 0 0 0  Risk for fall due to : History of fall(s);Impaired balance/gait;Orthopedic patient;Impaired mobility No Fall Risks No Fall Risks  No Fall Risks  Follow up Falls evaluation completed;Education provided Falls evaluation completed Falls prevention discussed;Falls  evaluation completed  Falls evaluation completed    MEDICARE RISK AT HOME:  Medicare Risk at Home Any stairs in or around the home?: Yes (also has a ramp) If so, are there any without handrails?: No Home free of loose throw rugs in walkways, pet beds, electrical cords, etc?: Yes Adequate lighting in your home to reduce risk of falls?: Yes Life alert?: No Use of a cane, walker or w/c?: Yes (uses cane on uneven terrain and uses [place]  brace on left ankle) Grab bars in the bathroom?: Yes Shower chair or bench in shower?: Yes Elevated toilet seat or a handicapped toilet?: Yes  TIMED UP AND GO:  Was the test performed?  No  Cognitive Function: 6CIT completed        05/08/2024    3:07 PM  04/23/2023    4:01 PM 04/18/2022    1:03 PM 04/15/2021    8:20 AM  6CIT Screen  What Year? 0 points 0 points 0 points 0 points  What month? 0 points 0 points 0 points 0 points  What time? 0 points 0 points 0 points 0 points  Count back from 20 0 points 0 points 0 points 0 points  Months in reverse 0 points 0 points 0 points 0 points  Repeat phrase 2 points 0 points 0 points 0 points  Total Score 2 points 0 points 0 points 0 points    Immunizations Immunization History  Administered Date(s) Administered   Fluad Quad(high Dose 65+) 06/29/2020, 08/01/2021, 12/15/2022   H1N1 01/13/2009   Influenza, High Dose Seasonal PF 11/28/2018, 07/08/2019, 07/10/2023   Influenza-Unspecified 07/23/2017, 07/08/2019   Moderna Sars-Covid-2 Vaccination 12/17/2019, 01/14/2020   Pneumococcal Conjugate-13 12/24/2018   Pneumococcal Polysaccharide-23 06/29/2020   Td 12/24/2018   Zoster Recombinant(Shingrix) 07/08/2019, 11/27/2019   Zoster, Live 01/24/2013    Screening Tests Health Maintenance  Topic Date Due   COVID-19 Vaccine (3 - Moderna risk series) 02/11/2020   INFLUENZA VACCINE  05/23/2024   Medicare Annual Wellness (AWV)  05/08/2025   Colonoscopy  06/11/2028   DTaP/Tdap/Td (2 - Tdap) 12/23/2028   Pneumococcal Vaccine: 50+ Years  Completed   Hepatitis C Screening  Completed   Zoster Vaccines- Shingrix  Completed   Hepatitis B Vaccines  Aged Out   HPV VACCINES  Aged Out   Meningococcal B Vaccine  Aged Out   Fecal DNA (Cologuard)  Discontinued    Health Maintenance  Health Maintenance Due  Topic Date Due   COVID-19 Vaccine (3 - Moderna risk series) 02/11/2020   Health Maintenance Items Addressed: See Nurse Notes at the end of this note  Additional Screening:  Vision Screening: Recommended annual ophthalmology exams for early detection of glaucoma and other disorders of the eye. Would you like a referral to an eye doctor? No    Dental Screening: Recommended annual dental exams for  proper oral hygiene  Community Resource Referral / Chronic Care Management: CRR required this visit?  No   CCM required this visit?  No   Plan:    I have personally reviewed and noted the following in the patient's chart:   Medical and social history Use of alcohol, tobacco or illicit drugs  Current medications and supplements including opioid prescriptions. Patient is not currently taking opioid prescriptions. Functional ability and status Nutritional status Physical activity Advanced directives List of other physicians Hospitalizations, surgeries, and ER visits in previous 12 months Vitals Screenings to include cognitive, depression, and falls Referrals and appointments  In addition, I have reviewed and  discussed with patient certain preventive protocols, quality metrics, and best practice recommendations. A written personalized care plan for preventive services as well as general preventive health recommendations were provided to patient.   Vina Ned, CMA   05/08/2024   After Visit Summary: (MyChart) Due to this being a telephonic visit, the after visit summary with patients personalized plan was offered to patient via MyChart   Notes: 6 CIT Score - 2 Declined Covid vaccine

## 2024-05-08 NOTE — Patient Instructions (Signed)
 Grant Diaz , Thank you for taking time out of your busy schedule to complete your Annual Wellness Visit with me. I enjoyed our conversation and look forward to speaking with you again next year. I, as well as your care team,  appreciate your ongoing commitment to your health goals. Please review the following plan we discussed and let me know if I can assist you in the future. Your Game plan/ To Do List    Referrals: None   Follow up Visits: Next Medicare AWV with our clinical staff: 05/21/25 @ 2:30pm (VIDEO VISIT)   Have you seen your provider in the last 6 months (3 months if uncontrolled diabetes)? Yes Next Office Visit with your provider: 05/27/24 @ 9:40 with Jolene Cannady, NP  Clinician Recommendations: Keep up the good work! Get the flu shot this fall.  Aim for 30 minutes of exercise or brisk walking, 6-8 glasses of water, and 5 servings of fruits and vegetables each day.        This is a list of the screening recommended for you and due dates:  Health Maintenance  Topic Date Due   COVID-19 Vaccine (3 - Moderna risk series) 02/11/2020   Flu Shot  05/23/2024   Medicare Annual Wellness Visit  05/08/2025   Colon Cancer Screening  06/11/2028   DTaP/Tdap/Td vaccine (2 - Tdap) 12/23/2028   Pneumococcal Vaccine for age over 40  Completed   Hepatitis C Screening  Completed   Zoster (Shingles) Vaccine  Completed   Hepatitis B Vaccine  Aged Out   HPV Vaccine  Aged Out   Meningitis B Vaccine  Aged Out   Cologuard (Stool DNA test)  Discontinued    Advanced directives: (In Chart) A copy of your advanced directives are scanned into your chart should your provider ever need it. Advance Care Planning is important because it:  [x]  Makes sure you receive the medical care that is consistent with your values, goals, and preferences  [x]  It provides guidance to your family and loved ones and reduces their decisional burden about whether or not they are making the right decisions based on your  wishes.  Follow the link provided in your after visit summary or read over the paperwork we have mailed to you to help you started getting your Advance Directives in place. If you need assistance in completing these, please reach out to us  so that we can help you!  See attachments for Preventive Care and Fall Prevention Tips.   Fall Prevention in the Home, Adult Falls can cause injuries and affect people of all ages. There are many simple things that you can do to make your home safe and to help prevent falls. If you need it, ask for help making these changes. What actions can I take to prevent falls? General information Use good lighting in all rooms. Make sure to: Replace any light bulbs that burn out. Turn on lights if it is dark and use night-lights. Keep items that you use often in easy-to-reach places. Lower the shelves around your home if needed. Move furniture so that there are clear paths around it. Do not keep throw rugs or other things on the floor that can make you trip. If any of your floors are uneven, fix them. Add color or contrast paint or tape to clearly mark and help you see: Grab bars or handrails. First and last steps of staircases. Where the edge of each step is. If you use a ladder or stepladder: Make sure  that it is fully opened. Do not climb a closed ladder. Make sure the sides of the ladder are locked in place. Have someone hold the ladder while you use it. Know where your pets are as you move through your home. What can I do in the bathroom?     Keep the floor dry. Clean up any water that is on the floor right away. Remove soap buildup in the bathtub or shower. Buildup makes bathtubs and showers slippery. Use non-skid mats or decals on the floor of the bathtub or shower. Attach bath mats securely with double-sided, non-slip rug tape. If you need to sit down while you are in the shower, use a non-slip stool. Install grab bars by the toilet and in the  bathtub and shower. Do not use towel bars as grab bars. What can I do in the bedroom? Make sure that you have a light by your bed that is easy to reach. Do not use any sheets or blankets on your bed that hang to the floor. Have a firm bench or chair with side arms that you can use for support when you get dressed. What can I do in the kitchen? Clean up any spills right away. If you need to reach something above you, use a sturdy step stool that has a grab bar. Keep electrical cables out of the way. Do not use floor polish or wax that makes floors slippery. What can I do with my stairs? Do not leave anything on the stairs. Make sure that you have a light switch at the top and the bottom of the stairs. Have them installed if you do not have them. Make sure that there are handrails on both sides of the stairs. Fix handrails that are broken or loose. Make sure that handrails are as long as the staircases. Install non-slip stair treads on all stairs in your home if they do not have carpet. Avoid having throw rugs at the top or bottom of stairs, or secure the rugs with carpet tape to prevent them from moving. Choose a carpet design that does not hide the edge of steps on the stairs. Make sure that carpet is firmly attached to the stairs. Fix any carpet that is loose or worn. What can I do on the outside of my home? Use bright outdoor lighting. Repair the edges of walkways and driveways and fix any cracks. Clear paths of anything that can make you trip, such as tools or rocks. Add color or contrast paint or tape to clearly mark and help you see high doorway thresholds. Trim any bushes or trees on the main path into your home. Check that handrails are securely fastened and in good repair. Both sides of all steps should have handrails. Install guardrails along the edges of any raised decks or porches. Have leaves, snow, and ice cleared regularly. Use sand, salt, or ice melt on walkways during winter  months if you live where there is ice and snow. In the garage, clean up any spills right away, including grease or oil spills. What other actions can I take? Review your medicines with your health care provider. Some medicines can make you confused or feel dizzy. This can increase your chance of falling. Wear closed-toe shoes that fit well and support your feet. Wear shoes that have rubber soles and low heels. Use a cane, walker, scooter, or crutches that help you move around if needed. Talk with your provider about other ways that you can decrease  your risk of falls. This may include seeing a physical therapist to learn to do exercises to improve movement and strength. Where to find more information Centers for Disease Control and Prevention, STEADI: TonerPromos.no [employer] on Aging: BaseRingTones.pl National Institute on Aging: BaseRingTones.pl Contact a health care provider if: You are afraid of falling at home. You feel weak, drowsy, or dizzy at home. You fall at home. Get help right away if you: Lose consciousness or have trouble moving after a fall. Have a fall that causes a head injury. These symptoms may be an emergency. Get help right away. Call 911. Do not wait to see if the symptoms will go away. Do not drive yourself to the hospital. This information is not intended to replace advice given to you by your health care provider. Make sure you discuss any questions you have with your health care provider. Document Revised: 06/12/2022 Document Reviewed: 06/12/2022 Elsevier Patient Education  2024 [employer].

## 2024-05-12 ENCOUNTER — Other Ambulatory Visit: Payer: Self-pay | Admitting: Nurse Practitioner

## 2024-05-14 NOTE — Telephone Encounter (Signed)
 Requested Prescriptions  Pending Prescriptions Disp Refills   celecoxib  (CELEBREX ) 100 MG capsule [Pharmacy Med Name: Celecoxib  100 MG Oral Capsule] 90 capsule 0    Sig: TAKE 1 CAPSULE BY MOUTH ONCE DAILY AS NEEDED (USE  MINIMALLY  SINCE  TAKING  XARELTO )     Analgesics:  COX2 Inhibitors Failed - 05/14/2024  9:16 AM      Failed - Manual Review: Labs are only required if the patient has taken medication for more than 8 weeks.      Passed - HGB in normal range and within 360 days    Hemoglobin  Date Value Ref Range Status  11/27/2023 13.1 13.0 - 17.7 g/dL Final         Passed - Cr in normal range and within 360 days    Creatinine, Ser  Date Value Ref Range Status  11/27/2023 1.04 0.76 - 1.27 mg/dL Final         Passed - HCT in normal range and within 360 days    Hematocrit  Date Value Ref Range Status  11/27/2023 39.6 37.5 - 51.0 % Final         Passed - AST in normal range and within 360 days    AST  Date Value Ref Range Status  11/27/2023 19 0 - 40 IU/L Final         Passed - ALT in normal range and within 360 days    ALT  Date Value Ref Range Status  11/27/2023 12 0 - 44 IU/L Final         Passed - eGFR is 30 or above and within 360 days    GFR calc Af Amer  Date Value Ref Range Status  11/24/2020 101 >59 mL/min/1.73 Final    Comment:    **In accordance with recommendations from the NKF-ASN Task force,**   [facility] is in the process of updating its eGFR calculation to the   2021 CKD-EPI creatinine equation that estimates kidney function   without a race variable.    GFR, Estimated  Date Value Ref Range Status  06/01/2021 >60 >60 mL/min Final    Comment:    (NOTE) Calculated using the CKD-EPI Creatinine Equation (2021)    eGFR  Date Value Ref Range Status  11/27/2023 75 >59 mL/min/1.73 Final         Passed - Patient is not pregnant      Passed - Valid encounter within last 12 months    Recent Outpatient Visits           5 months ago Paroxysmal atrial  fibrillation [facility])   [place] [facility] [place], Melanie DASEN, NP       Future Appointments             In 1 week Cannady, Jolene T, NP [facility]   In 1 month Fernand Denyse LABOR, MD [facility]   In 3 months Hester Alm BROCKS, MD [facility]

## 2024-05-22 ENCOUNTER — Other Ambulatory Visit: Payer: Self-pay | Admitting: Cardiovascular Disease

## 2024-05-22 DIAGNOSIS — G4733 Obstructive sleep apnea (adult) (pediatric): Secondary | ICD-10-CM

## 2024-05-22 DIAGNOSIS — E782 Mixed hyperlipidemia: Secondary | ICD-10-CM

## 2024-05-22 DIAGNOSIS — I480 Paroxysmal atrial fibrillation: Secondary | ICD-10-CM

## 2024-05-22 DIAGNOSIS — I10 Essential (primary) hypertension: Secondary | ICD-10-CM

## 2024-05-22 DIAGNOSIS — I340 Nonrheumatic mitral (valve) insufficiency: Secondary | ICD-10-CM

## 2024-05-23 NOTE — Patient Instructions (Incomplete)
 Be Involved in Caring For Your Health:  Taking Medications When medications are taken as directed, they can greatly improve your health. But if they are not taken as prescribed, they may not work. In some cases, not taking them correctly can be harmful. To help ensure your treatment remains effective and safe, understand your medications and how to take them. Bring your medications to each visit for review by your provider.  Your lab results, notes, and after visit summary will be available on My Chart. We strongly encourage you to use this feature. If lab results are abnormal the clinic will contact you with the appropriate steps. If the clinic does not contact you assume the results are satisfactory. You can always view your results on My Chart. If you have questions regarding your health or results, please contact the clinic during office hours. You can also ask questions on My Chart.  We at [facility] are grateful that you chose [place] to provide your care. We strive to provide evidence-based and compassionate care and are always looking for feedback. If you get a survey from the clinic please complete this so we can hear your opinions.  Heart-Healthy Eating Plan Many factors influence your heart health, including eating and exercise habits. Heart health is also called coronary health. Coronary risk increases with abnormal blood fat (lipid) levels. A heart-healthy eating plan includes limiting unhealthy fats, increasing healthy fats, limiting salt (sodium) intake, and making other diet and lifestyle changes. What is my plan? Your health care provider may recommend that: You limit your fat intake to _________% or less of your total calories each day. You limit your saturated fat intake to _________% or less of your total calories each day. You limit the amount of cholesterol in your diet to less than _________ mg per day. You limit the amount of sodium in your diet to less than _________  mg per day. What are tips for following this plan? Cooking Cook foods using methods other than frying. Baking, boiling, grilling, and broiling are all good options. Other ways to reduce fat include: Removing the skin from poultry. Removing all visible fats from meats. Steaming vegetables in water or broth. Meal planning  At meals, imagine dividing your plate into fourths: Fill one-half of your plate with vegetables and green salads. Fill one-fourth of your plate with whole grains. Fill one-fourth of your plate with lean protein foods. Eat 2-4 cups of vegetables per day. One cup of vegetables equals 1 cup (91 g) broccoli or cauliflower florets, 2 medium carrots, 1 large bell pepper, 1 large sweet potato, 1 large tomato, 1 medium white potato, 2 cups (150 g) raw leafy greens. Eat 1-2 cups of fruit per day. One cup of fruit equals 1 small apple, 1 large banana, 1 cup (237 g) mixed fruit, 1 large orange,  cup (82 g) dried fruit, 1 cup (240 mL) 100% fruit juice. Eat more foods that contain soluble fiber. Examples include apples, broccoli, carrots, beans, peas, and barley. Aim to get 25-30 g of fiber per day. Increase your consumption of legumes, nuts, and seeds to 4-5 servings per week. One serving of dried beans or legumes equals  cup (90 g) cooked, 1 serving of nuts is  oz (12 almonds, 24 pistachios, or 7 walnut halves), and 1 serving of seeds equals  oz (8 g). Fats Choose healthy fats more often. Choose monounsaturated and polyunsaturated fats, such as olive and canola oils, avocado oil, flaxseeds, walnuts, almonds, and seeds. Eat  more omega-3 fats. Choose salmon, mackerel, sardines, tuna, flaxseed oil, and ground flaxseeds. Aim to eat fish at least 2 times each week. Check food labels carefully to identify foods with trans fats or high amounts of saturated fat. Limit saturated fats. These are found in animal products, such as meats, butter, and cream. Plant sources of saturated fats  include palm oil, palm kernel oil, and coconut oil. Avoid foods with partially hydrogenated oils in them. These contain trans fats. Examples are stick margarine, some tub margarines, cookies, crackers, and other baked goods. Avoid fried foods. General information Eat more home-cooked food and less restaurant, buffet, and fast food. Limit or avoid alcohol. Limit foods that are high in added sugar and simple starches such as foods made using white refined flour (white breads, pastries, sweets). Lose weight if you are overweight. Losing just 5-10% of your body weight can help your overall health and prevent diseases such as diabetes and heart disease. Monitor your sodium intake, especially if you have high blood pressure. Talk with your health care provider about your sodium intake. Try to incorporate more vegetarian meals weekly. What foods should I eat? Fruits All fresh, canned (in natural juice), or frozen fruits. Vegetables Fresh or frozen vegetables (raw, steamed, roasted, or grilled). Green salads. Grains Most grains. Choose whole wheat and whole grains most of the time. Rice and pasta, including brown rice and pastas made with whole wheat. Meats and other proteins Lean, well-trimmed beef, veal, pork, and lamb. Chicken and [place] without skin. All fish and shellfish. Wild duck, rabbit, pheasant, and venison. Egg whites or low-cholesterol egg substitutes. Dried beans, peas, lentils, and tofu. Seeds and most nuts. Dairy Low-fat or nonfat cheeses, including ricotta and mozzarella. Skim or 1% milk (liquid, powdered, or evaporated). Buttermilk made with low-fat milk. Nonfat or low-fat yogurt. Fats and oils Non-hydrogenated (trans-free) margarines. Vegetable oils, including soybean, sesame, sunflower, olive, avocado, peanut, safflower, corn, canola, and cottonseed. Salad dressings or mayonnaise made with a vegetable oil. Beverages Water (mineral or sparkling). Coffee and tea. Unsweetened ice  tea. Diet beverages. Sweets and desserts Sherbet, gelatin, and fruit ice. Small amounts of dark chocolate. Limit all sweets and desserts. Seasonings and condiments All seasonings and condiments. The items listed above may not be a complete list of foods and beverages you can eat. Contact a dietitian for more options. What foods should I avoid? Fruits Canned fruit in heavy syrup. Fruit in cream or butter sauce. Fried fruit. Limit coconut. Vegetables Vegetables cooked in cheese, cream, or butter sauce. Fried vegetables. Grains Breads made with saturated or trans fats, oils, or whole milk. Croissants. Sweet rolls. Donuts. High-fat crackers, such as cheese crackers and chips. Meats and other proteins Fatty meats, such as hot dogs, ribs, sausage, bacon, rib-eye roast or steak. High-fat deli meats, such as salami and bologna. Caviar. Domestic duck and goose. Organ meats, such as liver. Dairy Cream, sour cream, cream cheese, and creamed cottage cheese. Whole-milk cheeses. Whole or 2% milk (liquid, evaporated, or condensed). Whole buttermilk. Cream sauce or high-fat cheese sauce. Whole-milk yogurt. Fats and oils Meat fat, or shortening. Cocoa butter, hydrogenated oils, palm oil, coconut oil, palm kernel oil. Solid fats and shortenings, including bacon fat, salt pork, lard, and butter. Nondairy cream substitutes. Salad dressings with cheese or sour cream. Beverages Regular sodas and any drinks with added sugar. Sweets and desserts Frosting. Pudding. Cookies. Cakes. Pies. Milk chocolate or white chocolate. Buttered syrups. Full-fat ice cream or ice cream drinks. The items listed above may  not be a complete list of foods and beverages to avoid. Contact a dietitian for more information. Summary Heart-healthy meal planning includes limiting unhealthy fats, increasing healthy fats, limiting salt (sodium) intake and making other diet and lifestyle changes. Lose weight if you are overweight. Losing just  5-10% of your body weight can help your overall health and prevent diseases such as diabetes and heart disease. Focus on eating a balance of foods, including fruits and vegetables, low-fat or nonfat dairy, lean protein, nuts and legumes, whole grains, and heart-healthy oils and fats. This information is not intended to replace advice given to you by your health care provider. Make sure you discuss any questions you have with your health care provider. Document Revised: 11/14/2021 Document Reviewed: 11/14/2021 Elsevier Patient Education  2024 [employer].

## 2024-05-27 ENCOUNTER — Ambulatory Visit: Payer: Medicare PPO | Admitting: Nurse Practitioner

## 2024-05-27 DIAGNOSIS — G4733 Obstructive sleep apnea (adult) (pediatric): Secondary | ICD-10-CM

## 2024-05-27 DIAGNOSIS — D6869 Other thrombophilia: Secondary | ICD-10-CM

## 2024-05-27 DIAGNOSIS — E782 Mixed hyperlipidemia: Secondary | ICD-10-CM

## 2024-05-27 DIAGNOSIS — I10 Essential (primary) hypertension: Secondary | ICD-10-CM

## 2024-05-27 DIAGNOSIS — I480 Paroxysmal atrial fibrillation: Secondary | ICD-10-CM

## 2024-05-27 DIAGNOSIS — D692 Other nonthrombocytopenic purpura: Secondary | ICD-10-CM

## 2024-05-27 DIAGNOSIS — D696 Thrombocytopenia, unspecified: Secondary | ICD-10-CM

## 2024-06-23 ENCOUNTER — Other Ambulatory Visit: Payer: Self-pay | Admitting: Cardiology

## 2024-06-23 DIAGNOSIS — G4733 Obstructive sleep apnea (adult) (pediatric): Secondary | ICD-10-CM

## 2024-06-23 DIAGNOSIS — I340 Nonrheumatic mitral (valve) insufficiency: Secondary | ICD-10-CM

## 2024-06-23 DIAGNOSIS — I10 Essential (primary) hypertension: Secondary | ICD-10-CM

## 2024-06-23 DIAGNOSIS — E782 Mixed hyperlipidemia: Secondary | ICD-10-CM

## 2024-06-23 DIAGNOSIS — I480 Paroxysmal atrial fibrillation: Secondary | ICD-10-CM

## 2024-06-26 ENCOUNTER — Ambulatory Visit: Admitting: Cardiovascular Disease

## 2024-06-26 ENCOUNTER — Encounter: Payer: Self-pay | Admitting: Cardiovascular Disease

## 2024-06-26 VITALS — BP 124/78 | HR 76 | Ht 69.0 in | Wt 250.1 lb

## 2024-06-26 DIAGNOSIS — R0602 Shortness of breath: Secondary | ICD-10-CM

## 2024-06-26 DIAGNOSIS — I340 Nonrheumatic mitral (valve) insufficiency: Secondary | ICD-10-CM

## 2024-06-26 DIAGNOSIS — G4733 Obstructive sleep apnea (adult) (pediatric): Secondary | ICD-10-CM

## 2024-06-26 DIAGNOSIS — E782 Mixed hyperlipidemia: Secondary | ICD-10-CM | POA: Diagnosis not present

## 2024-06-26 DIAGNOSIS — I480 Paroxysmal atrial fibrillation: Secondary | ICD-10-CM | POA: Diagnosis not present

## 2024-06-26 DIAGNOSIS — I10 Essential (primary) hypertension: Secondary | ICD-10-CM | POA: Diagnosis not present

## 2024-06-26 MED ORDER — METOPROLOL SUCCINATE ER 25 MG PO TB24: 25.0000 mg | ORAL_TABLET | Freq: Every day | ORAL | 11 refills | Status: DC

## 2024-06-26 NOTE — Progress Notes (Signed)
 Cardiology Office Note   Date:  06/26/2024   ID:  Grant Diaz, DOB 1951-04-03, MRN [id number]  PCP:  Grant Melanie DASEN, NP  Cardiologist:  Grant Bathe, MD      History of Present Illness: Grant Diaz is a 73 y.o. male who presents for  Chief Complaint  Patient presents with   Follow-up    3 month follow up    Has SOB on climbing hills or stairs, but unchanged, no chest pain.      Past Medical History:  Diagnosis Date   Arthritis    Atrial fibrillation (HCC)    Barrett's esophagus    Cancer (HCC) 2008   Prostate-radiation   Cataract 2019   not yet significant enough to warrant surgery   Dysrhythmia    Afib   GERD (gastroesophageal reflux disease)    History of DVT (deep vein thrombosis)    History of hiatal hernia    History of kidney stones    Hypertension    controlled w/meds   Joint pain    Mixed hyperlipidemia    Pancreatitis 2013   [facility]   Prostate cancer [facility])    Pulmonary embolus (HCC) 12/2014   Bilateral pulmonary embolus, [facility]   Sleep apnea 2010   use CPAP regularly   Sleep apnea, obstructive    cpap     Past Surgical History:  Procedure Laterality Date   BIOPSY  06/12/2023   Procedure: BIOPSY;  Surgeon: Therisa Bi, MD;  Location: [facility] ENDOSCOPY;  Service: Gastroenterology;;   ORIN MEDIATE RELEASE Right 03/15/2017   Procedure: CARPAL TUNNEL RELEASE;  Surgeon: Kathlynn Sharper, MD;  Location: [facility] ORS;  Service: Orthopedics;  Laterality: Right;   CHOLECYSTECTOMY     COLONOSCOPY WITH PROPOFOL  N/A 04/02/2018   Procedure: COLONOSCOPY WITH PROPOFOL ;  Surgeon: Jinny Carmine, MD;  Location: [facility] ENDOSCOPY;  Service: Endoscopy;  Laterality: N/A;   COLONOSCOPY WITH PROPOFOL  N/A 06/12/2023   Procedure: COLONOSCOPY WITH PROPOFOL ;  Surgeon: Therisa Bi, MD;  Location: [facility] ENDOSCOPY;  Service: Gastroenterology;  Laterality: N/A;   ESOPHAGOGASTRODUODENOSCOPY N/A 05/28/2013   Procedure: ESOPHAGOGASTRODUODENOSCOPY (EGD);  Surgeon: Alm VEAR Angle, MD;   Location: [facility] ENDOSCOPY;  Service: General;  Laterality: N/A;   ESOPHAGOGASTRODUODENOSCOPY (EGD) WITH PROPOFOL  N/A 02/08/2021   Procedure: ESOPHAGOGASTRODUODENOSCOPY (EGD) WITH PROPOFOL ;  Surgeon: Janalyn Keene NOVAK, MD;  Location: [facility] ENDOSCOPY;  Service: Endoscopy;  Laterality: N/A;   ESOPHAGOGASTRODUODENOSCOPY (EGD) WITH PROPOFOL  N/A 06/12/2023   Procedure: ESOPHAGOGASTRODUODENOSCOPY (EGD) WITH PROPOFOL ;  Surgeon: Therisa Bi, MD;  Location: [facility] ENDOSCOPY;  Service: Gastroenterology;  Laterality: N/A;   GALLBLADDER SURGERY  2003   GANGLION CYST EXCISION Right 03/15/2017   Procedure: REMOVAL GANGLION OF WRIST;  Surgeon: Kathlynn Sharper, MD;  Location: [facility] ORS;  Service: Orthopedics;  Laterality: Right;   HERNIA REPAIR     HIATAL HERNIA REPAIR N/A 05/31/2021   Procedure: LAPAROSCOPIC REPAIR OF HIATAL HERNIA WITH LAP BAND REMOVAL;  Surgeon: Kinsinger, Herlene Righter, MD;  Location: [facility] ORS;  Service: General;  Laterality: N/A;   JOINT REPLACEMENT     KNEE ARTHROSCOPY Bilateral [id number]   LAPAROSCOPIC GASTRIC BANDING  2009   and hernia repair   LAPAROSCOPIC REPAIR OF HIATAL HERNIA WITH LAP BAND REMOVAL   05/31/2021   POLYPECTOMY  06/12/2023   Procedure: POLYPECTOMY;  Surgeon: Therisa Bi, MD;  Location: [facility] ENDOSCOPY;  Service: Gastroenterology;;   RADIATION IMPLANT W/ ULTRASOUND, MALE  2005   internal/external radiation for prostate cancer   TOTAL KNEE ARTHROPLASTY Left 10/12/2015  Procedure: TOTAL KNEE ARTHROPLASTY;  Surgeon: Ozell Flake, MD;  Location: [facility] ORS;  Service: Orthopedics;  Laterality: Left;   TOTAL KNEE ARTHROPLASTY Right 05/15/2017   Procedure: TOTAL KNEE ARTHROPLASTY;  Surgeon: Flake Ozell, MD;  Location: [facility] ORS;  Service: Orthopedics;  Laterality: Right;   UMBILICAL HERNIA REPAIR  2010     Current Outpatient Medications  Medication Sig Dispense Refill   acetaminophen  (TYLENOL ) 325 MG tablet Take 650 mg by mouth every 6 (six) hours as needed for mild pain.      amiodarone  (PACERONE ) 200 MG tablet Take 1 tablet (200 mg total) by mouth daily. 30 tablet 11   amLODipine  (NORVASC ) 10 MG tablet Take 1 tablet (10 mg total) by mouth daily. 90 tablet 4   celecoxib  (CELEBREX ) 100 MG capsule TAKE 1 CAPSULE BY MOUTH ONCE DAILY AS NEEDED (USE  MINIMALLY  SINCE  TAKING  XARELTO ) 90 capsule 0   Cholecalciferol  25 MCG (1000 UT) capsule Take 1,000 Units by mouth daily.     ketoconazole  (NIZORAL ) 2 % cream Qhs to aa rash groin prn flares also use to both feet nightly for tinea pedis 60 g 11   lisinopril -hydrochlorothiazide  (ZESTORETIC ) 20-25 MG tablet Take 1 tablet by mouth daily. 90 tablet 4   metoprolol  succinate (TOPROL  XL) 25 MG 24 hr tablet Take 1 tablet (25 mg total) by mouth daily. 30 tablet 11   Multiple Vitamin (MULTIVITAMIN) capsule Take 1 capsule by mouth daily.     omeprazole  (PRILOSEC) 40 MG capsule Take 1 capsule (40 mg total) by mouth 2 (two) times daily. 60 capsule 11   rosuvastatin  (CRESTOR ) 10 MG tablet Take one tablet (10 MG) by mouth three times a week. 36 tablet 4   triamcinolone  cream (KENALOG ) 0.1 % Apply 1 Application topically as directed. Qd up to 5 days per week to aa eczema on body until clear, avoid face, groin, axilla 80 g 6   XARELTO  20 MG TABS tablet Take 1 tablet by mouth once daily 30 tablet 0   No current facility-administered medications for this visit.    Allergies:   Adhesive [tape]    Social History:   reports that he has never smoked. He has never used smokeless tobacco. He reports that he does not currently use alcohol. He reports that he does not use drugs.   Family History:  family history includes Arthritis in his brother and mother; Asthma in his brother; Diabetes in his mother and son; Heart disease in his maternal grandmother and paternal grandmother; Hypertension in his brother; Obesity in his mother; Parkinson's disease in his father; Stroke in his maternal grandfather and paternal grandfather.    ROS:     Review of  Systems  Constitutional: Negative.   HENT: Negative.    Eyes: Negative.   Respiratory: Negative.    Gastrointestinal: Negative.   Genitourinary: Negative.   Musculoskeletal: Negative.   Skin: Negative.   Neurological: Negative.   Endo/Heme/Allergies: Negative.   Psychiatric/Behavioral: Negative.    All other systems reviewed and are negative.     All other systems are reviewed and negative.    PHYSICAL EXAM: VS:  BP 124/78   Pulse 76   Ht 5' 9 (1.753 m)   Wt 250 lb 1.6 oz (113.4 kg)   SpO2 96%   BMI 36.93 kg/m  , BMI Body mass index is 36.93 kg/m. Last weight:  Wt Readings from Last 3 Encounters:  06/26/24 250 lb 1.6 oz (113.4 kg)  05/08/24 247 lb (112 kg)  03/20/24 258 lb 3.2 oz (117.1 kg)     Physical Exam Vitals reviewed.  Constitutional:      Appearance: Normal appearance. He is normal weight.  HENT:     Head: Normocephalic.     Nose: Nose normal.     Mouth/Throat:     Mouth: Mucous membranes are moist.  Eyes:     Pupils: Pupils are equal, round, and reactive to light.  Cardiovascular:     Rate and Rhythm: Normal rate and regular rhythm.     Pulses: Normal pulses.     Heart sounds: Normal heart sounds.  Pulmonary:     Effort: Pulmonary effort is normal.  Abdominal:     General: Abdomen is flat. Bowel sounds are normal.  Musculoskeletal:        General: Normal range of motion.     Cervical back: Normal range of motion.  Skin:    General: Skin is warm.  Neurological:     General: No focal deficit present.     Mental Status: He is alert.  Psychiatric:        Mood and Affect: Mood normal.       EKG:   Recent Labs: 11/27/2023: ALT 12; BUN 23; Creatinine, Ser 1.04; Hemoglobin 13.1; Magnesium  1.8; Platelets 190; Potassium 3.5; Sodium 142; TSH 1.400    Lipid Panel    Component Value Date/Time   CHOL 133 11/27/2023 1600   TRIG 104 11/27/2023 1600   HDL 43 11/27/2023 1600   LDLCALC 71 11/27/2023 1600      Other studies  Reviewed: Additional studies/ records that were reviewed today include:  Review of the above records demonstrates:      01/21/2018    4:36 PM  PAD Screen  Previous PAD dx? No  Previous surgical procedure? No  Pain with walking? No  Feet/toe relief with dangling? No  Painful, non-healing ulcers? No  Extremities discolored? No      ASSESSMENT AND PLAN:    ICD-10-CM   1. Paroxysmal atrial fibrillation (HCC)  I48.0 metoprolol  succinate (TOPROL  XL) 25 MG 24 hr tablet   In NSR occasional palpitation. add metoprolol  25 as HR  high.    2. Essential hypertension, benign  I10 metoprolol  succinate (TOPROL  XL) 25 MG 24 hr tablet    3. Mixed hyperlipidemia  E78.2 metoprolol  succinate (TOPROL  XL) 25 MG 24 hr tablet    4. Obstructive sleep apnea syndrome  G47.33 metoprolol  succinate (TOPROL  XL) 25 MG 24 hr tablet    5. Nonrheumatic mitral valve regurgitation  I34.0 metoprolol  succinate (TOPROL  XL) 25 MG 24 hr tablet   mild    6. Morbid obesity (HCC)  E66.01 metoprolol  succinate (TOPROL  XL) 25 MG 24 hr tablet       Problem List Items Addressed This Visit       Cardiovascular and Mediastinum   Essential hypertension, benign   Relevant Medications   metoprolol  succinate (TOPROL  XL) 25 MG 24 hr tablet   Atrial fibrillation (HCC) - Primary   Relevant Medications   metoprolol  succinate (TOPROL  XL) 25 MG 24 hr tablet     Respiratory   Sleep apnea   Relevant Medications   metoprolol  succinate (TOPROL  XL) 25 MG 24 hr tablet     Other   Morbid obesity (HCC)   Relevant Medications   metoprolol  succinate (TOPROL  XL) 25 MG 24 hr tablet   Mixed hyperlipidemia   Relevant Medications   metoprolol  succinate (TOPROL  XL) 25 MG 24 hr tablet   Other  Visit Diagnoses       Nonrheumatic mitral valve regurgitation       mild   Relevant Medications   metoprolol  succinate (TOPROL  XL) 25 MG 24 hr tablet          Disposition:   Return in about 4 weeks (around 07/24/2024).    Total time  spent: 45 minutes  Signed,  Grant Bathe, MD  06/26/2024 10:36 AM    [facility]

## 2024-06-28 DIAGNOSIS — Z79899 Other long term (current) drug therapy: Secondary | ICD-10-CM | POA: Insufficient documentation

## 2024-06-28 NOTE — Patient Instructions (Signed)
 Be Involved in Caring For Your Health:  Taking Medications When medications are taken as directed, they can greatly improve your health. But if they are not taken as prescribed, they may not work. In some cases, not taking them correctly can be harmful. To help ensure your treatment remains effective and safe, understand your medications and how to take them. Bring your medications to each visit for review by your provider.  Your lab results, notes, and after visit summary will be available on My Chart. We strongly encourage you to use this feature. If lab results are abnormal the clinic will contact you with the appropriate steps. If the clinic does not contact you assume the results are satisfactory. You can always view your results on My Chart. If you have questions regarding your health or results, please contact the clinic during office hours. You can also ask questions on My Chart.  We at [facility] are grateful that you chose us  to provide your care. We strive to provide evidence-based and compassionate care and are always looking for feedback. If you get a survey from the clinic please complete this so we can hear your opinions.  DASH Eating Plan DASH stands for Dietary Approaches to Stop Hypertension. The DASH eating plan is a healthy eating plan that has been shown to: Lower high blood pressure (hypertension). Reduce your risk for type 2 diabetes, heart disease, and stroke. Help with weight loss. What are tips for following this plan? Reading food labels Check food labels for the amount of salt (sodium) per serving. Choose foods with less than 5 percent of the Daily Value (DV) of sodium. In general, foods with less than 300 milligrams (mg) of sodium per serving fit into this eating plan. To find whole grains, look for the word whole as the first word in the ingredient list. Shopping Buy products labeled as low-sodium or no salt added. Buy fresh foods. Avoid canned  foods and pre-made or frozen meals. Cooking Try not to add salt when you cook. Use salt-free seasonings or herbs instead of table salt or sea salt. Check with your health care provider or pharmacist before using salt substitutes. Do not fry foods. Cook foods in healthy ways, such as baking, boiling, grilling, roasting, or broiling. Cook using oils that are good for your heart. These include olive, canola, avocado, soybean, and sunflower oil. Meal planning  Eat a balanced diet. This should include: 4 or more servings of fruits and 4 or more servings of vegetables each day. Try to fill half of your plate with fruits and vegetables. 6-8 servings of whole grains each day. 6 or less servings of lean meat, poultry, or fish each day. 1 oz is 1 serving. A 3 oz (85 g) serving of meat is about the same size as the palm of your hand. One egg is 1 oz (28 g). 2-3 servings of low-fat dairy each day. One serving is 1 cup (237 mL). 1 serving of nuts, seeds, or beans 5 times each week. 2-3 servings of heart-healthy fats. Healthy fats called omega-3 fatty acids are found in foods such as walnuts, flaxseeds, fortified milks, and eggs. These fats are also found in cold-water fish, such as sardines, salmon, and mackerel. Limit how much you eat of: Canned or prepackaged foods. Food that is high in trans fat, such as fried foods. Food that is high in saturated fat, such as fatty meat. Desserts and other sweets, sugary drinks, and other foods with added sugar. Full-fat  dairy products. Do not salt foods before eating. Do not eat more than 4 egg yolks a week. Try to eat at least 2 vegetarian meals a week. Eat more home-cooked food and less restaurant, buffet, and fast food. Lifestyle When eating at a restaurant, ask if your food can be made with less salt or no salt. If you drink alcohol: Limit how much you have to: 0-1 drink a day if you are male. 0-2 drinks a day if you are male. Know how much alcohol is in  your drink. In the [place] one drink is one 12 oz bottle of beer (355 mL), one 5 oz glass of wine (148 mL), or one 1 oz glass of hard liquor (44 mL). General information Avoid eating more than 2,300 mg of salt a day. If you have hypertension, you may need to reduce your sodium intake to 1,500 mg a day. Work with your provider to stay at a healthy body weight or lose weight. Ask what the best weight range is for you. On most days of the week, get at least 30 minutes of exercise that causes your heart to beat faster. This may include walking, swimming, or biking. Work with your provider or dietitian to adjust your eating plan to meet your specific calorie needs. What foods should I eat? Fruits All fresh, dried, or frozen fruit. Canned fruits that are in their natural juice and do not have sugar added to them. Vegetables Fresh or frozen vegetables that are raw, steamed, roasted, or grilled. Low-sodium or reduced-sodium tomato and vegetable juice. Low-sodium or reduced-sodium tomato sauce and tomato paste. Low-sodium or reduced-sodium canned vegetables. Grains Whole-grain or whole-wheat bread. Whole-grain or whole-wheat pasta. Brown rice. Mcneil Madeira. Bulgur. Whole-grain and low-sodium cereals. Pita bread. Low-fat, low-sodium crackers. Whole-wheat flour tortillas. Meats and other proteins Skinless chicken or [place]. Ground chicken or [place]. Pork with fat trimmed off. Fish and seafood. Egg whites. Dried beans, peas, or lentils. Unsalted nuts, nut butters, and seeds. Unsalted canned beans. Lean cuts of beef with fat trimmed off. Low-sodium, lean precooked or cured meat, such as sausages or meat loaves. Dairy Low-fat (1%) or fat-free (skim) milk. Reduced-fat, low-fat, or fat-free cheeses. Nonfat, low-sodium ricotta or cottage cheese. Low-fat or nonfat yogurt. Low-fat, low-sodium cheese. Fats and oils Soft margarine without trans fats. Vegetable oil. Reduced-fat, low-fat, or light mayonnaise and salad  dressings (reduced-sodium). Canola, safflower, olive, avocado, soybean, and sunflower oils. Avocado. Seasonings and condiments Herbs. Spices. Seasoning mixes without salt. Other foods Unsalted popcorn and pretzels. Fat-free sweets. The items listed above may not be all the foods and drinks you can have. Talk to a dietitian to learn more. What foods should I avoid? Fruits Canned fruit in a light or heavy syrup. Fried fruit. Fruit in cream or butter sauce. Vegetables Creamed or fried vegetables. Vegetables in a cheese sauce. Regular canned vegetables that are not marked as low-sodium or reduced-sodium. Regular canned tomato sauce and paste that are not marked as low-sodium or reduced-sodium. Regular tomato and vegetable juices that are not marked as low-sodium or reduced-sodium. Dene. Olives. Grains Baked goods made with fat, such as croissants, muffins, or some breads. Dry pasta or rice meal packs. Meats and other proteins Fatty cuts of meat. Ribs. Fried meat. [place]. Bologna, salami, and other precooked or cured meats, such as sausages or meat loaves, that are not lean and low in sodium. Fat from the back of a pig (fatback). Bratwurst. Salted nuts and seeds. Canned beans with added salt. Canned  or smoked fish. Whole eggs or egg yolks. Chicken or [place] with skin. Dairy Whole or 2% milk, cream, and half-and-half. Whole or full-fat cream cheese. Whole-fat or sweetened yogurt. Full-fat cheese. Nondairy creamers. Whipped toppings. Processed cheese and cheese spreads. Fats and oils Butter. Stick margarine. Lard. Shortening. Ghee. Bacon fat. Tropical oils, such as coconut, palm kernel, or palm oil. Seasonings and condiments Onion salt, garlic salt, seasoned salt, table salt, and sea salt. Worcestershire sauce. Tartar sauce. Barbecue sauce. Teriyaki sauce. Soy sauce, including reduced-sodium soy sauce. Steak sauce. Canned and packaged gravies. Fish sauce. Oyster sauce. Cocktail sauce. Store-bought  horseradish. Ketchup. Mustard. Meat flavorings and tenderizers. Bouillon cubes. Hot sauces. Pre-made or packaged marinades. Pre-made or packaged taco seasonings. Relishes. Regular salad dressings. Other foods Salted popcorn and pretzels. The items listed above may not be all the foods and drinks you should avoid. Talk to a dietitian to learn more. Where to find more information National Heart, Lung, and Blood Institute (NHLBI): BuffaloDryCleaner.gl American Heart Association (AHA): heart.org Academy of Nutrition and Dietetics: eatright.org National Kidney Foundation (NKF): kidney.org This information is not intended to replace advice given to you by your health care provider. Make sure you discuss any questions you have with your health care provider. Document Revised: 10/26/2022 Document Reviewed: 10/26/2022 Elsevier Patient Education  2024 [employer].

## 2024-06-30 ENCOUNTER — Ambulatory Visit: Admitting: Nurse Practitioner

## 2024-06-30 ENCOUNTER — Encounter: Payer: Self-pay | Admitting: Nurse Practitioner

## 2024-06-30 VITALS — BP 125/80 | HR 65 | Temp 98.9°F | Resp 15 | Ht 69.02 in | Wt 249.0 lb

## 2024-06-30 DIAGNOSIS — D692 Other nonthrombocytopenic purpura: Secondary | ICD-10-CM

## 2024-06-30 DIAGNOSIS — E782 Mixed hyperlipidemia: Secondary | ICD-10-CM

## 2024-06-30 DIAGNOSIS — G4733 Obstructive sleep apnea (adult) (pediatric): Secondary | ICD-10-CM

## 2024-06-30 DIAGNOSIS — D6869 Other thrombophilia: Secondary | ICD-10-CM

## 2024-06-30 DIAGNOSIS — I480 Paroxysmal atrial fibrillation: Secondary | ICD-10-CM

## 2024-06-30 DIAGNOSIS — Z23 Encounter for immunization: Secondary | ICD-10-CM

## 2024-06-30 DIAGNOSIS — I10 Essential (primary) hypertension: Secondary | ICD-10-CM

## 2024-06-30 DIAGNOSIS — D696 Thrombocytopenia, unspecified: Secondary | ICD-10-CM

## 2024-06-30 DIAGNOSIS — Z79899 Other long term (current) drug therapy: Secondary | ICD-10-CM

## 2024-06-30 DIAGNOSIS — M2142 Flat foot [pes planus] (acquired), left foot: Secondary | ICD-10-CM

## 2024-06-30 NOTE — Assessment & Plan Note (Signed)
Ongoing.  Noted on exam, discussed with patient.  Ensure gentle skin care and monitor for wounds, alert provider if they present.

## 2024-06-30 NOTE — Assessment & Plan Note (Signed)
 Chronic with some worsening pain recently.  Continue brace and rest at home.  He plans to return to ortho for recommendations.

## 2024-06-30 NOTE — Assessment & Plan Note (Signed)
 Chronic, stable with BP well below goal for age. Recommend he monitor BP at least a few mornings a week at home and document.  DASH diet at home.  Continue current medication regimen and adjust as needed based on levels.  Labs today: CMP.  Urine ALB 80 (February 2025). Return in 6 months.

## 2024-06-30 NOTE — Assessment & Plan Note (Signed)
 Chronic with rate controlled.  Diagnosed by cardiology. Dr. Fernand, prior to surgery 05/31/21.  Continue Xarelto  and other medications as ordered by them. Recent notes and echo reviewed.

## 2024-06-30 NOTE — Assessment & Plan Note (Signed)
 BMI 36.75 with A-fib, HTN/HLD.  Recommended eating smaller high protein, low fat meals more frequently and exercising 30 mins a day 5 times a week with a goal of 10-15lb weight loss in the next 3 months. Patient voiced their understanding and motivation to adhere to these recommendations.  Can not use GLP1 due to past pancreatitis issues.

## 2024-06-30 NOTE — Assessment & Plan Note (Signed)
Chronic, ongoing.  Poor tolerance of daily Crestor at low dose.  Will continue 3 day a week dosing at this time.  Lipid panel today.  Educated patient on statin use and side effects.  Could consider Zetia if elevations noted.

## 2024-06-30 NOTE — Progress Notes (Signed)
 BP 125/80 (BP Location: Left Arm, Patient Position: Sitting, Cuff Size: Large)   Pulse 65   Temp 98.9 F (37.2 C) (Oral)   Resp 15   Ht 5' 9.02 (1.753 m)   Wt 249 lb (112.9 kg)   SpO2 97%   BMI 36.75 kg/m    Subjective:    Patient ID: Grant Diaz, male    DOB: 1950/12/21, 73 y.o.   MRN: [id number]  HPI: Grant Diaz is a 73 y.o. male  Chief Complaint  Patient presents with   Foot Pain    Left foot, ongoing for years but seems to be getting worse. Uses brace most of the times.    Hypertension    No issues but checks every other week,    Atrial Fibrillation    Cardiology prescribed new med to help. Goes back in about a month. Noticied heart flutters when ibuprofen which he stopped when he read information online. No issues since.     HYPERTENSION & A - FIB & HLD Follows with Dr. Fernand for cardiology, last visit 06/26/24, they started Amiodarone  and Metoprolol . Taking Zestoretic  20-25, Xarelto , Amlodipine , and Crestor .  Echo May 2025 EF 60%. Uses CPAP every night, sometimes takes off at 3 am if irritates him. H/O pulmonary embolism. Endorses wearing compression stockings daily. Hypertension status: controlled  Satisfied with current treatment? yes Duration of hypertension: chronic BP monitoring frequency: not often -- every 3 weeks BP range: 120/80 range on average BP medication side effects:  no Medication compliance: good compliance Aspirin : no Recurrent headaches: no Visual changes: no Palpitations: no Dyspnea: no Chest pain: no Lower extremity edema: no Dizzy/lightheaded: no  The 10-year ASCVD risk score (Arnett DK, et al., 2019) is: 23.6%   Values used to calculate the score:     Age: 29 years     Clincally relevant sex: Male     Is Non-Hispanic African American: No     Diabetic: No     Tobacco smoker: No     Systolic Blood Pressure: 125 mmHg     Is BP treated: Yes     HDL Cholesterol: 43 mg/dL     Total Cholesterol: 133 mg/dL   FOOT PAIN Left foot pain  due to flat foot.  Is worsening. Wears brace.  Has seen orthopedics in past. Duration: chronic Involved foot: left Mechanism of injury: as above Location: medial aspect Onset: gradual  Severity: 4-6/10 Quality:  sharp, aching, pressure-like, and throbbing Frequency: intermittent Radiation: no Aggravating factors: weight bearing, walking, and movement  Alleviating factors: resting, wearing brace  Status: worse Treatments attempted: brace and rest + OTC medications  Relief with NSAIDs?:  No NSAIDs Taken Weakness with weight bearing or walking: no Morning stiffness: no Swelling: no Redness: no Bruising: no Paresthesias / decreased sensation: no  Fevers:no      05/08/2024    3:00 PM 11/27/2023    3:20 PM 04/23/2023    3:56 PM 12/15/2022   10:45 AM 11/07/2022    3:22 PM  Depression screen PHQ 2/9  Decreased Interest 0 0 0 0 0  Down, Depressed, Hopeless 0 0 0 0 0  PHQ - 2 Score 0 0 0 0 0  Altered sleeping 0 0 0 0 0  Tired, decreased energy 0 1 0 1 0  Change in appetite 0 0 0 0 0  Feeling bad or failure about yourself  0 0 0 0 0  Trouble concentrating 0 0 0 0 0  Moving slowly or  fidgety/restless 0 0 0 0 0  Suicidal thoughts 0 0 0 0 0  PHQ-9 Score 0 1 0 1 0  Difficult doing work/chores Not difficult at all Not difficult at all Not difficult at all  Not difficult at all       11/27/2023    3:20 PM 12/15/2022   10:45 AM 11/07/2022    3:22 PM 05/25/2022   10:47 AM  GAD 7 : Generalized Anxiety Score  Nervous, Anxious, on Edge 0 0 0 0  Control/stop worrying 0 0 0 0  Worry too much - different things 0 0 0 0  Trouble relaxing 0 0 0 0  Restless 0 0 0 0  Easily annoyed or irritable 0 0 0 0  Afraid - awful might happen 0 0 0 0  Total GAD 7 Score 0 0 0 0  Anxiety Difficulty Not difficult at all Not difficult at all Not difficult at all    Relevant past medical, surgical, family and social history reviewed and updated as indicated. Interim medical history since our last visit  reviewed. Allergies and medications reviewed and updated.  Review of Systems  Constitutional:  Negative for activity change, diaphoresis, fatigue and fever.  Respiratory:  Negative for cough, chest tightness, shortness of breath and wheezing.   Cardiovascular:  Negative for chest pain, palpitations and leg swelling.  Gastrointestinal: Negative.   Musculoskeletal:  Positive for arthralgias.  Neurological: Negative.   Psychiatric/Behavioral: Negative.      Per HPI unless specifically indicated above     Objective:    BP 125/80 (BP Location: Left Arm, Patient Position: Sitting, Cuff Size: Large)   Pulse 65   Temp 98.9 F (37.2 C) (Oral)   Resp 15   Ht 5' 9.02 (1.753 m)   Wt 249 lb (112.9 kg)   SpO2 97%   BMI 36.75 kg/m   Wt Readings from Last 3 Encounters:  06/30/24 249 lb (112.9 kg)  06/26/24 250 lb 1.6 oz (113.4 kg)  05/08/24 247 lb (112 kg)    Physical Exam Vitals and nursing note reviewed.  Constitutional:      General: He is awake. He is not in acute distress.    Appearance: He is well-developed and well-groomed. He is obese. He is not ill-appearing or toxic-appearing.  HENT:     Head: Normocephalic.     Right Ear: Hearing and external ear normal.     Left Ear: Hearing and external ear normal.  Eyes:     General: Lids are normal.     Extraocular Movements: Extraocular movements intact.     Conjunctiva/sclera: Conjunctivae normal.  Neck:     Thyroid : No thyromegaly.     Vascular: No carotid bruit.  Cardiovascular:     Rate and Rhythm: Normal rate and regular rhythm.     Heart sounds: Normal heart sounds. No murmur heard.    No gallop.  Pulmonary:     Effort: No accessory muscle usage or respiratory distress.     Breath sounds: Normal breath sounds.  Abdominal:     General: Bowel sounds are normal. There is no distension.     Palpations: Abdomen is soft.     Tenderness: There is no abdominal tenderness.  Musculoskeletal:     Cervical back: Full passive  range of motion without pain.     Right lower leg: No edema.     Left lower leg: No edema.     Right foot: Normal range of motion.  Left foot: Decreased range of motion.  Feet:     Comments: Brace on left foot.   Lymphadenopathy:     Cervical: No cervical adenopathy.  Skin:    General: Skin is warm.     Capillary Refill: Capillary refill takes less than 2 seconds.     Findings: Bruising present.     Comments: To bilateral upper extremities bruises.  Neurological:     Mental Status: He is alert and oriented to person, place, and time.     Deep Tendon Reflexes: Reflexes are normal and symmetric.     Reflex Scores:      Brachioradialis reflexes are 2+ on the right side and 2+ on the left side.      Patellar reflexes are 2+ on the right side and 2+ on the left side. Psychiatric:        Attention and Perception: Attention normal.        Mood and Affect: Mood normal.        Speech: Speech normal.        Behavior: Behavior normal. Behavior is cooperative.        Thought Content: Thought content normal.    Results for orders placed or performed in visit on 11/27/23  Microalbumin, Urine Waived   Collection Time: 11/27/23  3:59 PM  Result Value Ref Range   Microalb, Ur Waived 80 (H) 0 - 19 mg/L   Creatinine, Urine Waived 200 10 - 300 mg/dL   Microalb/Creat Ratio 30-300 (H) <30 mg/g  CBC with Differential/Platelet   Collection Time: 11/27/23  4:00 PM  Result Value Ref Range   WBC 5.8 3.4 - 10.8 x10E3/uL   RBC 4.33 4.14 - 5.80 x10E6/uL   Hemoglobin 13.1 13.0 - 17.7 g/dL   Hematocrit 60.3 62.4 - 51.0 %   MCV 92 79 - 97 fL   MCH 30.3 26.6 - 33.0 pg   MCHC 33.1 31.5 - 35.7 g/dL   RDW 86.8 88.3 - 84.5 %   Platelets 190 150 - 450 x10E3/uL   Neutrophils 53 Not Estab. %   Lymphs 28 Not Estab. %   Monocytes 10 Not Estab. %   Eos 8 Not Estab. %   Basos 1 Not Estab. %   Neutrophils Absolute 3.0 1.4 - 7.0 x10E3/uL   Lymphocytes Absolute 1.7 0.7 - 3.1 x10E3/uL   Monocytes Absolute  0.6 0.1 - 0.9 x10E3/uL   EOS (ABSOLUTE) 0.5 (H) 0.0 - 0.4 x10E3/uL   Basophils Absolute 0.1 0.0 - 0.2 x10E3/uL   Immature Granulocytes 0 Not Estab. %   Immature Grans (Abs) 0.0 0.0 - 0.1 x10E3/uL  Comprehensive metabolic panel   Collection Time: 11/27/23  4:00 PM  Result Value Ref Range   Glucose 107 (H) 70 - 99 mg/dL   BUN 23 8 - 27 mg/dL   Creatinine, Ser 8.95 0.76 - 1.27 mg/dL   eGFR 75 >40 fO/fpw/8.26   BUN/Creatinine Ratio 22 10 - 24   Sodium 142 134 - 144 mmol/L   Potassium 3.5 3.5 - 5.2 mmol/L   Chloride 103 96 - 106 mmol/L   CO2 24 20 - 29 mmol/L   Calcium  9.0 8.6 - 10.2 mg/dL   Total Protein 6.7 6.0 - 8.5 g/dL   Albumin 4.1 3.8 - 4.8 g/dL   Globulin, Total 2.6 1.5 - 4.5 g/dL   Bilirubin Total 0.3 0.0 - 1.2 mg/dL   Alkaline Phosphatase 83 44 - 121 IU/L   AST 19 0 - 40 IU/L  ALT 12 0 - 44 IU/L  Lipid Panel w/o Chol/HDL Ratio   Collection Time: 11/27/23  4:00 PM  Result Value Ref Range   Cholesterol, Total 133 100 - 199 mg/dL   Triglycerides 895 0 - 149 mg/dL   HDL 43 >60 mg/dL   VLDL Cholesterol Cal 19 5 - 40 mg/dL   LDL Chol Calc (NIH) 71 0 - 99 mg/dL  TSH   Collection Time: 11/27/23  4:00 PM  Result Value Ref Range   TSH 1.400 0.450 - 4.500 uIU/mL  PSA   Collection Time: 11/27/23  4:00 PM  Result Value Ref Range   Prostate Specific Ag, Serum <0.1 0.0 - 4.0 ng/mL  Magnesium    Collection Time: 11/27/23  4:00 PM  Result Value Ref Range   Magnesium  1.8 1.6 - 2.3 mg/dL      Assessment & Plan:   Problem List Items Addressed This Visit       Cardiovascular and Mediastinum   Senile purpura (HCC)   Ongoing.  Noted on exam, discussed with patient.  Ensure gentle skin care and monitor for wounds, alert provider if they present.      Essential hypertension, benign   Chronic, stable with BP well below goal for age. Recommend he monitor BP at least a few mornings a week at home and document.  DASH diet at home.  Continue current medication regimen and adjust as  needed based on levels.  Labs today: CMP.  Urine ALB 80 (February 2025). Return in 6 months.       Atrial fibrillation (HCC) - Primary   Chronic with rate controlled.  Diagnosed by cardiology. Dr. Fernand, prior to surgery 05/31/21.  Continue Xarelto  and other medications as ordered by them. Recent notes and echo reviewed.      Relevant Orders   TSH     Respiratory   Sleep apnea   Chronic, stable. Continue with CPAP QHS, 100% use        Hematopoietic and Hemostatic   Thrombocytopenia (HCC)   Noted on labs since October 2022 -- recheck CBC at physical and monitor closely.        Other   On amiodarone  therapy   Recently started by cardiology, Dr. Fernand, will check TSH today and monitor Q6MOS.      Relevant Orders   TSH   Morbid obesity (HCC)   BMI 36.75 with A-fib, HTN/HLD.  Recommended eating smaller high protein, low fat meals more frequently and exercising 30 mins a day 5 times a week with a goal of 10-15lb weight loss in the next 3 months. Patient voiced their understanding and motivation to adhere to these recommendations.  Can not use GLP1 due to past pancreatitis issues.       Mixed hyperlipidemia   Chronic, ongoing.  Poor tolerance of daily Crestor  at low dose.  Will continue 3 day a week dosing at this time.  Lipid panel today.  Educated patient on statin use and side effects.  Could consider Zetia if elevations noted.      Relevant Orders   Comprehensive metabolic panel with GFR   Lipid Panel w/o Chol/HDL Ratio   Flat foot (pes planus) (acquired), left foot   Chronic with some worsening pain recently.  Continue brace and rest at home.  He plans to return to ortho for recommendations.      Other Visit Diagnoses       Flu vaccine need       Flu vaccine in office today,  educated on this.   Relevant Orders   Flu vaccine HIGH DOSE PF(Fluzone Trivalent) (Completed)        Follow up plan: Return in about 22 weeks (around 12/01/2024) for Annual Physical after  11/26/24.

## 2024-06-30 NOTE — Assessment & Plan Note (Signed)
 Noted on labs since October 2022 -- recheck CBC at physical and monitor closely.

## 2024-06-30 NOTE — Assessment & Plan Note (Signed)
Chronic, stable. Continue with CPAP QHS, 100% use 

## 2024-06-30 NOTE — Assessment & Plan Note (Signed)
 Recently started by cardiology, Dr. Fernand, will check TSH today and monitor Q6MOS.

## 2024-07-01 ENCOUNTER — Ambulatory Visit: Payer: Self-pay | Admitting: Nurse Practitioner

## 2024-07-01 LAB — COMPREHENSIVE METABOLIC PANEL WITH GFR
ALT: 14 IU/L (ref 0–44)
AST: 21 IU/L (ref 0–40)
Albumin: 3.8 g/dL (ref 3.8–4.8)
Alkaline Phosphatase: 71 IU/L (ref 44–121)
BUN/Creatinine Ratio: 22 (ref 10–24)
BUN: 22 mg/dL (ref 8–27)
Bilirubin Total: 0.4 mg/dL (ref 0.0–1.2)
CO2: 23 mmol/L (ref 20–29)
Calcium: 9.1 mg/dL (ref 8.6–10.2)
Chloride: 104 mmol/L (ref 96–106)
Creatinine, Ser: 1.01 mg/dL (ref 0.76–1.27)
Globulin, Total: 2.4 g/dL (ref 1.5–4.5)
Glucose: 93 mg/dL (ref 70–99)
Potassium: 3.4 mmol/L — ABNORMAL LOW (ref 3.5–5.2)
Sodium: 143 mmol/L (ref 134–144)
Total Protein: 6.2 g/dL (ref 6.0–8.5)
eGFR: 78 mL/min/1.73 (ref >59)

## 2024-07-01 LAB — LIPID PANEL W/O CHOL/HDL RATIO
Cholesterol, Total: 117 mg/dL (ref 100–199)
HDL: 43 mg/dL (ref >39)
LDL Chol Calc (NIH): 61 mg/dL (ref 0–99)
Triglycerides: 59 mg/dL (ref 0–149)
VLDL Cholesterol Cal: 13 mg/dL (ref 5–40)

## 2024-07-01 LAB — TSH: TSH: 1.63 u[IU]/mL (ref 0.450–4.500)

## 2024-07-01 NOTE — Progress Notes (Signed)
 Contacted via MyChart  Good afternoon Grant Diaz, your labs have returned: - Kidney function, creatinine and eGFR, remains normal, as is liver function, AST and ALT.  - Potassium very mildly low, add a little potassium rich foods to diet and we will continue to monitor at visits. - Remainder of labs stable.  Any questions? Keep being stellar!!  Thank you for allowing me to participate in your care.  I appreciate you. Kindest regards, Astrid Vides

## 2024-07-29 ENCOUNTER — Ambulatory Visit: Admitting: Cardiovascular Disease

## 2024-07-29 ENCOUNTER — Encounter: Payer: Self-pay | Admitting: Cardiovascular Disease

## 2024-07-29 VITALS — BP 124/82 | HR 52 | Ht 69.0 in | Wt 256.8 lb

## 2024-07-29 DIAGNOSIS — R002 Palpitations: Secondary | ICD-10-CM | POA: Diagnosis not present

## 2024-07-29 DIAGNOSIS — E782 Mixed hyperlipidemia: Secondary | ICD-10-CM | POA: Diagnosis not present

## 2024-07-29 DIAGNOSIS — I10 Essential (primary) hypertension: Secondary | ICD-10-CM

## 2024-07-29 DIAGNOSIS — I480 Paroxysmal atrial fibrillation: Secondary | ICD-10-CM

## 2024-07-29 DIAGNOSIS — I340 Nonrheumatic mitral (valve) insufficiency: Secondary | ICD-10-CM

## 2024-07-29 NOTE — Progress Notes (Signed)
 Cardiology Office Note   Date:  07/29/2024   ID:  Grant Diaz, DOB [birth year], MRN [id number]  PCP:  Valerio Melanie DASEN, NP  Cardiologist:  Denyse Bathe, MD      History of Present Illness: Grant Diaz is a 73 y.o. male who presents for  Chief Complaint  Patient presents with   Follow-up    4 week follow up. Has occasional heart flutters.     Has lots of palpitation intermittently, no chest pain or SOB.      Past Medical History:  Diagnosis Date   Arthritis    Atrial fibrillation (HCC)    Barrett's esophagus    Cancer (HCC) 2008   Prostate-radiation   Cataract 2019   not yet significant enough to warrant surgery   Dysrhythmia    Afib   GERD (gastroesophageal reflux disease)    History of DVT (deep vein thrombosis)    History of hiatal hernia    History of kidney stones    Hypertension    controlled w/meds   Joint pain    Mixed hyperlipidemia    Pancreatitis 2013   [facility]   Prostate cancer [facility])    Pulmonary embolus (HCC) 12/2014   Bilateral pulmonary embolus, [facility]   Sleep apnea 2010   use CPAP regularly   Sleep apnea, obstructive    cpap     Past Surgical History:  Procedure Laterality Date   BIOPSY  06/12/2023   Procedure: BIOPSY;  Surgeon: Therisa Bi, MD;  Location: [facility] ENDOSCOPY;  Service: Gastroenterology;;   ORIN MEDIATE RELEASE Right 03/15/2017   Procedure: CARPAL TUNNEL RELEASE;  Surgeon: Kathlynn Sharper, MD;  Location: [facility] ORS;  Service: Orthopedics;  Laterality: Right;   CHOLECYSTECTOMY     COLONOSCOPY WITH PROPOFOL  N/A 04/02/2018   Procedure: COLONOSCOPY WITH PROPOFOL ;  Surgeon: Jinny Carmine, MD;  Location: [facility] ENDOSCOPY;  Service: Endoscopy;  Laterality: N/A;   COLONOSCOPY WITH PROPOFOL  N/A 06/12/2023   Procedure: COLONOSCOPY WITH PROPOFOL ;  Surgeon: Therisa Bi, MD;  Location[facility] ENDOSCOPY;  Service: Gastroenterology;  Laterality: N/A;   ESOPHAGOGASTRODUODENOSCOPY N/A 05/28/2013   Procedure: ESOPHAGOGASTRODUODENOSCOPY (EGD);   Surgeon: Alm VEAR Angle, MD;  Location: [facility] ENDOSCOPY;  Service: General;  Laterality: N/A;   ESOPHAGOGASTRODUODENOSCOPY (EGD) WITH PROPOFOL  N/A 02/08/2021   Procedure: ESOPHAGOGASTRODUODENOSCOPY (EGD) WITH PROPOFOL ;  Surgeon: Janalyn Keene NOVAK, MD;  Location: [facility] ENDOSCOPY;  Service: Endoscopy;  Laterality: N/A;   ESOPHAGOGASTRODUODENOSCOPY (EGD) WITH PROPOFOL  N/A 06/12/2023   Procedure: ESOPHAGOGASTRODUODENOSCOPY (EGD) WITH PROPOFOL ;  Surgeon: Therisa Bi, MD;  Location: [facility] ENDOSCOPY;  Service: Gastroenterology;  Laterality: N/A;   GALLBLADDER SURGERY  2003   GANGLION CYST EXCISION Right 03/15/2017   Procedure: REMOVAL GANGLION OF WRIST;  Surgeon: Kathlynn Sharper, MD;  Location: [facility] ORS;  Service: Orthopedics;  Laterality: Right;   HERNIA REPAIR     HIATAL HERNIA REPAIR N/A 05/31/2021   Procedure: LAPAROSCOPIC REPAIR OF HIATAL HERNIA WITH LAP BAND REMOVAL;  Surgeon: Kinsinger, Herlene Righter, MD;  Location: [facility] ORS;  Service: General;  Laterality: N/A;   JOINT REPLACEMENT     KNEE ARTHROSCOPY Bilateral [id number]   LAPAROSCOPIC GASTRIC BANDING  2009   and hernia repair   LAPAROSCOPIC REPAIR OF HIATAL HERNIA WITH LAP BAND REMOVAL   05/31/2021   POLYPECTOMY  06/12/2023   Procedure: POLYPECTOMY;  Surgeon: Therisa Bi, MD;  Location: [facility] ENDOSCOPY;  Service: Gastroenterology;;   RADIATION IMPLANT W/ ULTRASOUND, MALE  2005   internal/external radiation for prostate cancer   TOTAL KNEE ARTHROPLASTY  Left 10/12/2015   Procedure: TOTAL KNEE ARTHROPLASTY;  Surgeon: Ozell Flake, MD;  Location: [facility] ORS;  Service: Orthopedics;  Laterality: Left;   TOTAL KNEE ARTHROPLASTY Right 05/15/2017   Procedure: TOTAL KNEE ARTHROPLASTY;  Surgeon: Flake Ozell, MD;  Location: [facility] ORS;  Service: Orthopedics;  Laterality: Right;   UMBILICAL HERNIA REPAIR  2010     Current Outpatient Medications  Medication Sig Dispense Refill   acetaminophen  (TYLENOL ) 325 MG tablet Take 650 mg by mouth every 6 (six) hours as  needed for mild pain.     amiodarone  (PACERONE ) 200 MG tablet Take 1 tablet (200 mg total) by mouth daily. 30 tablet 11   amLODipine  (NORVASC ) 10 MG tablet Take 1 tablet (10 mg total) by mouth daily. 90 tablet 4   celecoxib  (CELEBREX ) 100 MG capsule TAKE 1 CAPSULE BY MOUTH ONCE DAILY AS NEEDED (USE  MINIMALLY  SINCE  TAKING  XARELTO ) 90 capsule 0   Cholecalciferol  25 MCG (1000 UT) capsule Take 1,000 Units by mouth daily.     ketoconazole  (NIZORAL ) 2 % cream Qhs to aa rash groin prn flares also use to both feet nightly for tinea pedis 60 g 11   lisinopril -hydrochlorothiazide  (ZESTORETIC ) 20-25 MG tablet Take 1 tablet by mouth daily. 90 tablet 4   metoprolol  succinate (TOPROL  XL) 25 MG 24 hr tablet Take 1 tablet (25 mg total) by mouth daily. 30 tablet 11   Multiple Vitamin (MULTIVITAMIN) capsule Take 1 capsule by mouth daily.     omeprazole  (PRILOSEC) 40 MG capsule Take 1 capsule (40 mg total) by mouth 2 (two) times daily. 60 capsule 11   rosuvastatin  (CRESTOR ) 10 MG tablet Take one tablet (10 MG) by mouth three times a week. 36 tablet 4   triamcinolone  cream (KENALOG ) 0.1 % Apply 1 Application topically as directed. Qd up to 5 days per week to aa eczema on body until clear, avoid face, groin, axilla 80 g 6   XARELTO  20 MG TABS tablet Take 1 tablet by mouth once daily 30 tablet 0   No current facility-administered medications for this visit.    Allergies:   Adhesive [tape]    Social History:   reports that he has never smoked. He has never used smokeless tobacco. He reports that he does not currently use alcohol. He reports that he does not use drugs.   Family History:  family history includes Arthritis in his brother and mother; Asthma in his brother; Diabetes in his mother and son; Heart disease in his maternal grandmother and paternal grandmother; Hypertension in his brother; Obesity in his mother; Parkinson's disease in his father; Stroke in his maternal grandfather and paternal grandfather.     ROS:     Review of Systems  Constitutional: Negative.   HENT: Negative.    Eyes: Negative.   Respiratory: Negative.    Gastrointestinal: Negative.   Genitourinary: Negative.   Musculoskeletal: Negative.   Skin: Negative.   Neurological: Negative.   Endo/Heme/Allergies: Negative.   Psychiatric/Behavioral: Negative.    All other systems reviewed and are negative.     All other systems are reviewed and negative.    PHYSICAL EXAM: VS:  BP 124/82   Pulse (!) 52   Ht 5' 9 (1.753 m)   Wt 256 lb 12.8 oz (116.5 kg)   SpO2 97%   BMI 37.92 kg/m  , BMI Body mass index is 37.92 kg/m. Last weight:  Wt Readings from Last 3 Encounters:  07/29/24 256 lb 12.8 oz (116.5 kg)  06/30/24 249 lb (112.9 kg)  06/26/24 250 lb 1.6 oz (113.4 kg)     Physical Exam Vitals reviewed.  Constitutional:      Appearance: Normal appearance. He is normal weight.  HENT:     Head: Normocephalic.     Nose: Nose normal.     Mouth/Throat:     Mouth: Mucous membranes are moist.  Eyes:     Pupils: Pupils are equal, round, and reactive to light.  Cardiovascular:     Rate and Rhythm: Normal rate and regular rhythm.     Pulses: Normal pulses.     Heart sounds: Normal heart sounds.  Pulmonary:     Effort: Pulmonary effort is normal.  Abdominal:     General: Abdomen is flat. Bowel sounds are normal.  Musculoskeletal:        General: Normal range of motion.     Cervical back: Normal range of motion.  Skin:    General: Skin is warm.  Neurological:     General: No focal deficit present.     Mental Status: He is alert.  Psychiatric:        Mood and Affect: Mood normal.       EKG:   Recent Labs: 11/27/2023: Hemoglobin 13.1; Magnesium  1.8; Platelets 190 06/30/2024: ALT 14; BUN 22; Creatinine, Ser 1.01; Potassium 3.4; Sodium 143; TSH 1.630    Lipid Panel    Component Value Date/Time   CHOL 117 06/30/2024 1536   TRIG 59 06/30/2024 1536   HDL 43 06/30/2024 1536   LDLCALC 61 06/30/2024 1536       Other studies Reviewed: Additional studies/ records that were reviewed today include:  Review of the above records demonstrates:      01/21/2018    4:36 PM  PAD Screen  Previous PAD dx? No  Previous surgical procedure? No  Pain with walking? No  Feet/toe relief with dangling? No  Painful, non-healing ulcers? No  Extremities discolored? No      ASSESSMENT AND PLAN:    ICD-10-CM   1. Palpitation  R00.2 CARDIAC EVENT MONITOR   Has palpitation as has h/o afib, but on exam NSR. Advise holter.    2. Paroxysmal atrial fibrillation (HCC)  I48.0 CARDIAC EVENT MONITOR    3. Essential hypertension, benign  I10 CARDIAC EVENT MONITOR    4. Mixed hyperlipidemia  E78.2 CARDIAC EVENT MONITOR    5. Nonrheumatic mitral valve regurgitation  I34.0 CARDIAC EVENT MONITOR       Problem List Items Addressed This Visit       Cardiovascular and Mediastinum   Essential hypertension, benign   Relevant Orders   CARDIAC EVENT MONITOR   Atrial fibrillation (HCC)   Relevant Orders   CARDIAC EVENT MONITOR     Other   Mixed hyperlipidemia   Relevant Orders   CARDIAC EVENT MONITOR   Other Visit Diagnoses       Palpitation    -  Primary   Has palpitation as has h/o afib, but on exam NSR. Advise holter.   Relevant Orders   CARDIAC EVENT MONITOR     Nonrheumatic mitral valve regurgitation       Relevant Orders   CARDIAC EVENT MONITOR          Disposition:   Return in about 6 weeks (around 09/09/2024) for holter and f/u.    Total time spent: 35 minutes  Signed,  Denyse Bathe, MD  07/29/2024 10:49 AM    [facility]

## 2024-07-31 ENCOUNTER — Encounter

## 2024-07-31 DIAGNOSIS — R002 Palpitations: Secondary | ICD-10-CM

## 2024-07-31 DIAGNOSIS — I480 Paroxysmal atrial fibrillation: Secondary | ICD-10-CM

## 2024-07-31 DIAGNOSIS — E782 Mixed hyperlipidemia: Secondary | ICD-10-CM

## 2024-07-31 DIAGNOSIS — I340 Nonrheumatic mitral (valve) insufficiency: Secondary | ICD-10-CM

## 2024-07-31 DIAGNOSIS — I10 Essential (primary) hypertension: Secondary | ICD-10-CM

## 2024-08-03 ENCOUNTER — Other Ambulatory Visit: Payer: Self-pay | Admitting: Cardiovascular Disease

## 2024-08-03 DIAGNOSIS — I340 Nonrheumatic mitral (valve) insufficiency: Secondary | ICD-10-CM

## 2024-08-03 DIAGNOSIS — E782 Mixed hyperlipidemia: Secondary | ICD-10-CM

## 2024-08-03 DIAGNOSIS — G4733 Obstructive sleep apnea (adult) (pediatric): Secondary | ICD-10-CM

## 2024-08-03 DIAGNOSIS — I480 Paroxysmal atrial fibrillation: Secondary | ICD-10-CM

## 2024-08-03 DIAGNOSIS — I10 Essential (primary) hypertension: Secondary | ICD-10-CM

## 2024-08-06 ENCOUNTER — Encounter: Payer: Self-pay | Admitting: Nurse Practitioner

## 2024-08-08 ENCOUNTER — Other Ambulatory Visit: Payer: Self-pay | Admitting: Nurse Practitioner

## 2024-08-11 ENCOUNTER — Ambulatory Visit

## 2024-08-11 DIAGNOSIS — W908XXA Exposure to other nonionizing radiation, initial encounter: Secondary | ICD-10-CM

## 2024-08-11 DIAGNOSIS — Z1283 Encounter for screening for malignant neoplasm of skin: Secondary | ICD-10-CM | POA: Diagnosis not present

## 2024-08-11 DIAGNOSIS — L570 Actinic keratosis: Secondary | ICD-10-CM | POA: Diagnosis not present

## 2024-08-11 DIAGNOSIS — L578 Other skin changes due to chronic exposure to nonionizing radiation: Secondary | ICD-10-CM | POA: Diagnosis not present

## 2024-08-11 DIAGNOSIS — L814 Other melanin hyperpigmentation: Secondary | ICD-10-CM

## 2024-08-11 MED ORDER — FLUOROURACIL 5 % EX CREA: TOPICAL_CREAM | CUTANEOUS | 1 refills | Status: AC

## 2024-08-11 NOTE — Progress Notes (Signed)
 Subjective   Grant Diaz is a 73 y.o. male who presents for the following: Lesion(s) of concern . Patient is established patient   Today patient reports: Lesion of concern at top of scalp x 6 months -1 year, not healing, not painful, not itchy, at times can turn white. Patient was prescribed 5FU/Calcipotriene at last visit in April by Dr. Hester, but states he never used it due him working at the time, but has now retired. Patient wanting to know if still worthy using.  Review of Systems:    No other skin or systemic complaints except as noted in HPI or Assessment and Plan.  The following portions of the chart were reviewed this encounter and updated as appropriate: medications, allergies, medical history  Relevant Medical History:  Personal history of actinic keratosis   Objective  Well appearing patient in no apparent distress; mood and affect are within normal limits. Examination was performed of the: Sun Exposed Exam: Scalp, head, eyes, ears, nose, lips, neck, upper extremities, hands, fingers, fingernails  Examination notable for: Lentigo/lentigines: Scattered pigmented macules that are tan to brown in color and are somewhat non-uniform in shape and concentrated in the sun-exposed areas, Actinic Damage/Elastosis: chronic sun damage: dyspigmentation, telangiectasia, and wrinkling, Actinic keratosis: Scaly erythematous macule(s) concentrated on sun exposed areas   Examination limited by: Undergarments, Shoes or socks , Clothing, and Patient deferred removal     Scalp x5 (5) Pink scaly macules  Assessment & Plan   SKIN CANCER SCREENING PERFORMED TODAY.  BENIGN SKIN FINDINGS  - Lentigines - Reassurance provided regarding the benign appearance of lesions noted on exam today; no treatment is indicated in the absence of symptoms/changes. - Reinforced importance of photoprotective strategies including liberal and frequent sunscreen use of a broad-spectrum SPF 30 or greater, use  of protective clothing, and sun avoidance for prevention of cutaneous malignancy and photoaging.  Counseled patient on the importance of regular self-skin monitoring as well as routine clinical skin examinations as scheduled.   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 This is a chronic with expected duration over 1 year. A portion of actinic keratoses will progress to squamous cell carcinoma of the skin. It is not possible to reliably predict which spots will progress to skin cancer and so treatment is recommended to prevent development of skin cancer. - Reviewed sun avoidance practices, including protective clothing and hat, midday sun avoidance, sunscreen SPF>30 applications Q2-3H when in sun especially after sweating - Reviewed signs/symptoms of skin cancer, patient asked come in sooner if these appear. - Discussed treatment options - After discusison of options for treatment including risks, benefits and alternatives, the patient elected to treat with a course of Efudex  to be applied to affected areas  - Start efudex  5% cream (5-fluorouracil ) twice daily for 3-4 weeks - Discussed the longer the product is used the more effective it is - Educated on the risk of redness, irritation, pain. Advised to hold treatment if developing side effects.  - Wash hands after use - Advised sun protection and avoidance     Level of service outlined above   Procedures, orders, diagnosis for this visit:  AK (ACTINIC KERATOSIS) (5) Scalp x5 (5) Actinic keratoses are precancerous spots that appear secondary to cumulative UV radiation exposure/sun exposure over time. They are chronic with expected duration over 1 year. A portion of actinic keratoses will progress to squamous cell carcinoma of the skin. It is  not possible to reliably predict which spots will progress to skin cancer and so treatment is recommended to prevent development of skin  cancer.  Recommend daily broad spectrum sunscreen SPF 30+ to sun-exposed areas, reapply every 2 hours as needed.  Recommend staying in the shade or wearing long sleeves, sun glasses (UVA+UVB protection) and wide brim hats (4-inch brim around the entire circumference of the hat). Call for new or changing lesions. Destruction of lesion - Scalp x5 (5) Complexity: simple   Destruction method: cryotherapy   Informed consent: discussed and consent obtained   Timeout:  patient name, date of birth, surgical site, and procedure verified Lesion destroyed using liquid nitrogen: Yes   Region frozen until ice ball extended beyond lesion: Yes   Cryo cycles: 1 or 2. Outcome: patient tolerated procedure well with no complications   Post-procedure details: wound care instructions given   Additional details:  Prior to procedure, discussed risks of blister formation, small wound, skin dyspigmentation, or rare scar following cryotherapy. Recommend Vaseline ointment to treated areas while healing.     AK (actinic keratosis) -     Destruction of lesion  Other orders -     Fluorouracil ; Apply twice daily to treatment area for 2-4 weeks as tolerated  Dispense: 40 g; Refill: 1   Return to clinic: Return for As scheduled, TBSE, w/ Dr. Hester.  I, Jacquelynn V. Wilfred, CMA, am acting as scribe for Lauraine JAYSON Kanaris, MD .   Documentation: I have reviewed the above documentation for accuracy and completeness, and I agree with the above.  Lauraine JAYSON Kanaris, MD

## 2024-08-11 NOTE — Patient Instructions (Addendum)
 - Start efudex  5% cream (5-fluorouracil ) twice daily for 3-4 weeks - Discussed the longer the product is used the more effective it is - Educated on the risk of redness, irritation, pain. Advised to hold treatment if developing side effects.  - Wash hands after use - Advised sun protection and avoidance     Cryosurgery  Cryosurgery ("freezing") uses liquid nitrogen to destroy certain types of skin lesions. Lowering the temperature of the lesion in a small area surrounding skin destroys the lesion. Immediately following cryosurgery, you will notice redness and swelling of the treatment area. Blistering or weeping may occur, lasting approximately one week which will then be followed by crusting. Most areas will heal completely in 10 to 14 days.  Wash the treated areas daily. Allow soap and water to run over the areas, but do not scrub. Should a scab or crust form, allow it to fall off on its own. Do not remove or pick at it. Application of an ointment  and a bandage may make you feel more comfortable, but it is not necessary. Some people develop an allergy to Neosporin , so we recommend that Vaseline or  Aquaphor be used.  The cryotherapy site will be more sensitive than your surrounding skin. Keep it covered, and remember to apply sunscreen every day to all your sun exposed skin. A scar may remain which is lighter or pinker than your normal skin. Your body will continue to improve your scar for up to one year; however a light-colored scar may remain.  Infection following cryotherapy is rare. However if you are worried about the appearance of the treated area, contact your doctor. We have a physician on call at all times. If you have any concerns about the site, please call our clinic at [phone]    Due to recent changes in healthcare laws, you may see results of your pathology and/or laboratory studies on MyChart before the doctors have had a chance to review them. We understand that in some  cases there may be results that are confusing or concerning to you. Please understand that not all results are received at the same time and often the doctors may need to interpret multiple results in order to provide you with the best plan of care or course of treatment. Therefore, we ask that you please give us  2 business days to thoroughly review all your results before contacting the office for clarification. Should we see a critical lab result, you will be contacted sooner.   If You Need Anything After Your Visit  If you have any questions or concerns for your doctor, please call our main line at [phone] and press option 4 to reach your doctor's medical assistant. If no one answers, please leave a voicemail as directed and we will return your call as soon as possible. Messages left after 4 pm will be answered the following business day.   You may also send us  a message via MyChart. We typically respond to MyChart messages within 1-2 business days.  For prescription refills, please ask your pharmacy to contact our office. Our fax number is [phone].  If you have an urgent issue when the clinic is closed that cannot wait until the next business day, you can page your doctor at the number below.    Please note that while we do our best to be available for urgent issues outside of office hours, we are not available 24/7.   If you have an urgent issue and are unable  to reach us , you may choose to seek medical care at your doctor's office, retail clinic, urgent care center, or emergency room.  If you have a medical emergency, please immediately call 911 or go to the emergency department.  Pager Numbers  - Dr. Hester: [phone]  - Dr. Jackquline: [phone]  - Dr. Claudene: [phone]   In the event of inclement weather, please call our main line at [phone] for an update on the status of any delays or closures.  Dermatology Medication Tips: Please keep the boxes that  topical medications come in in order to help keep track of the instructions about where and how to use these. Pharmacies typically print the medication instructions only on the boxes and not directly on the medication tubes.   If your medication is too expensive, please contact our office at [phone] option 4 or send us  a message through MyChart.   We are unable to tell what your co-pay for medications will be in advance as this is different depending on your insurance coverage. However, we may be able to find a substitute medication at lower cost or fill out paperwork to get insurance to cover a needed medication.   If a prior authorization is required to get your medication covered by your insurance company, please allow us  1-2 business days to complete this process.  Drug prices often vary depending on where the prescription is filled and some pharmacies may offer cheaper prices.  The website www.goodrx.com contains coupons for medications through different pharmacies. The prices here do not account for what the cost may be with help from insurance (it may be cheaper with your insurance), but the website can give you the price if you did not use any insurance.  - You can print the associated coupon and take it with your prescription to the pharmacy.  - You may also stop by our office during regular business hours and pick up a GoodRx coupon card.  - If you need your prescription sent electronically to a different pharmacy, notify our office through [facility] or by phone at [phone] option 4.     Si Usted Necesita Algo Despus de Su Visita  Tambin puede enviarnos un mensaje a travs de Clinical cytogeneticist. Por lo general respondemos a los mensajes de MyChart en el transcurso de 1 a 2 das hbiles.  Para renovar recetas, por favor pida a su farmacia que se ponga en contacto con nuestra oficina. Randi lakes de fax es [place] [phone].  Si tiene un asunto urgente cuando la clnica  est cerrada y que no puede esperar hasta el siguiente da hbil, puede llamar/localizar a su doctor(a) al nmero que aparece a continuacin.   Por favor, tenga en cuenta que aunque hacemos todo lo posible para estar disponibles para asuntos urgentes fuera del horario de [place], no estamos disponibles las 24 horas del da, los 7 [address]  [address] de la [place].   Si tiene un problema urgente y no puede comunicarse con nosotros, puede optar por buscar atencin mdica  en el consultorio de su doctor(a), en una clnica privada, en un centro de atencin urgente o en una sala de emergencias.  Si tiene Engineer, drilling, por favor llame inmediatamente al 911 o vaya a la sala de emergencias.  Nmeros de bper  - Dr. Hester: [phone]  - Dra. Jackquline: [phone]  - Dr. Claudene: [phone]   En caso de inclemencias del tiempo, por favor llame a landry capes principal al [phone] para ignacia actualizacin sobre el  estado de cualquier retraso o cierre.  Consejos para la medicacin en dermatologa: Por favor, guarde las cajas en las que vienen los medicamentos de uso tpico para ayudarle a seguir las instrucciones sobre dnde y cmo usarlos. Las farmacias generalmente imprimen las instrucciones del medicamento slo en las cajas y no directamente en los tubos del [place].   Si su medicamento es muy caro, por favor, pngase en contacto con landry rieger llamando al [phone] y presione la opcin 4 o envenos un mensaje a travs de Clinical cytogeneticist.   No podemos decirle cul ser su copago por los medicamentos por adelantado ya que esto es diferente dependiendo de la cobertura de su seguro. Sin embargo, es posible que podamos encontrar un medicamento sustituto a Audiological scientist un formulario para que el seguro cubra el medicamento que se considera necesario.   Si se requiere una autorizacin previa para que su compaa de seguros malta su medicamento, por favor permtanos de 1 a 2 das hbiles para  completar este proceso.  Los precios de los medicamentos varan con frecuencia dependiendo del Environmental consultant de dnde se surte la receta y alguna farmacias pueden ofrecer precios ms baratos.  El sitio web www.goodrx.com tiene cupones para medicamentos de Health and safety inspector. Los precios aqu no tienen en cuenta lo que podra costar con la ayuda del seguro (puede ser ms barato con su seguro), pero el sitio web puede darle el precio si no utiliz Tourist information centre manager.  - Puede imprimir el cupn correspondiente y llevarlo con su receta a la farmacia.  - Tambin puede pasar por nuestra oficina durante el horario de atencin regular y Education officer, museum una tarjeta de cupones de GoodRx.  - Si necesita que su receta se enve electrnicamente a una farmacia diferente, informe a nuestra oficina a travs de MyChart de [place] o por telfono llamando al [phone] y presione la opcin 4.

## 2024-08-11 NOTE — Telephone Encounter (Signed)
 Requested Prescriptions  Pending Prescriptions Disp Refills   celecoxib  (CELEBREX ) 100 MG capsule [Pharmacy Med Name: Celecoxib  100 MG Oral Capsule] 90 capsule 0    Sig: TAKE 1 CAPSULE BY MOUTH ONCE DAILY AS NEEDED (USE  MINIMALLY  SINCE  TAKING  XARELTO )     Analgesics:  COX2 Inhibitors Failed - 08/11/2024 11:44 AM      Failed - Manual Review: Labs are only required if the patient has taken medication for more than 8 weeks.      Passed - HGB in normal range and within 360 days    Hemoglobin  Date Value Ref Range Status  11/27/2023 13.1 13.0 - 17.7 g/dL Final         Passed - Cr in normal range and within 360 days    Creatinine, Ser  Date Value Ref Range Status  06/30/2024 1.01 0.76 - 1.27 mg/dL Final         Passed - HCT in normal range and within 360 days    Hematocrit  Date Value Ref Range Status  11/27/2023 39.6 37.5 - 51.0 % Final         Passed - AST in normal range and within 360 days    AST  Date Value Ref Range Status  06/30/2024 21 0 - 40 IU/L Final         Passed - ALT in normal range and within 360 days    ALT  Date Value Ref Range Status  06/30/2024 14 0 - 44 IU/L Final         Passed - eGFR is 30 or above and within 360 days    GFR calc Af Amer  Date Value Ref Range Status  11/24/2020 101 >59 mL/min/1.73 Final    Comment:    **In accordance with recommendations from the NKF-ASN Task force,**   [facility] is in the process of updating its eGFR calculation to the   2021 CKD-EPI creatinine equation that estimates kidney function   without a race variable.    GFR, Estimated  Date Value Ref Range Status  06/01/2021 >60 >60 mL/min Final    Comment:    (NOTE) Calculated using the CKD-EPI Creatinine Equation (2021)    eGFR  Date Value Ref Range Status  06/30/2024 78 >59 mL/min/1.73 Final         Passed - Patient is not pregnant      Passed - Valid encounter within last 12 months    Recent Outpatient Visits           1 month ago Paroxysmal atrial  fibrillation (HCC)   [place] [place], Murillo T, NP   8 months ago Paroxysmal atrial fibrillation [facility])   [place] [place], Melanie DASEN, NP       Future Appointments             Today Carrizales, Lauraine BROCKS, MD [facility]   In 5 months Hester Alm BROCKS, MD [facility]

## 2024-08-12 ENCOUNTER — Ambulatory Visit: Admitting: Dermatology

## 2024-08-14 DIAGNOSIS — R002 Palpitations: Secondary | ICD-10-CM | POA: Diagnosis not present

## 2024-08-19 ENCOUNTER — Telehealth: Payer: Self-pay

## 2024-08-19 ENCOUNTER — Ambulatory Visit: Admitting: Cardiovascular Disease

## 2024-08-19 ENCOUNTER — Encounter: Payer: Self-pay | Admitting: Cardiovascular Disease

## 2024-08-19 VITALS — BP 121/81 | HR 60 | Ht 69.0 in | Wt 258.4 lb

## 2024-08-19 DIAGNOSIS — G4733 Obstructive sleep apnea (adult) (pediatric): Secondary | ICD-10-CM

## 2024-08-19 DIAGNOSIS — I10 Essential (primary) hypertension: Secondary | ICD-10-CM | POA: Diagnosis not present

## 2024-08-19 DIAGNOSIS — I480 Paroxysmal atrial fibrillation: Secondary | ICD-10-CM | POA: Diagnosis not present

## 2024-08-19 DIAGNOSIS — E782 Mixed hyperlipidemia: Secondary | ICD-10-CM | POA: Diagnosis not present

## 2024-08-19 DIAGNOSIS — I340 Nonrheumatic mitral (valve) insufficiency: Secondary | ICD-10-CM

## 2024-08-19 DIAGNOSIS — R002 Palpitations: Secondary | ICD-10-CM

## 2024-08-19 MED ORDER — METOPROLOL SUCCINATE ER 25 MG PO TB24: 12.5000 mg | ORAL_TABLET | Freq: Every day | ORAL | 11 refills | Status: DC

## 2024-08-19 NOTE — Telephone Encounter (Unsigned)
 Copied from CRM [id number]. Topic: General - Other >> Aug 19, 2024  4:01 PM Yolanda T wrote: Reason for CRM: patient called stated his last sleep study was in 2016 but he does not have the exact date. Patient is trying to get a new CPAP machine sent to him through [employer]. Please f/u with patient

## 2024-08-19 NOTE — Telephone Encounter (Unsigned)
 Copied from CRM [id number]. Topic: Clinical - Prescription Issue >> Aug 19, 2024 12:03 PM Everette C wrote: Reason for CRM: The patient has called to follow up on a previous prescription request for a CPAP machine. The patient's old machine cannot be used at all, there's a international aid/development worker through [employer] that can be utilized  The patient will call back with the number to be added   (Please include number when contacted) >> Aug 19, 2024 12:22 PM Winona R wrote: [employer]- ask for Documentation department Phone number [phone].  Pt states his Afib is back and its because he hasn't been able to use his Cpap for the past two weeks. Pt will also contacted the old office for his last sleep study to verify when the last one was conducted. He said maybe 6 year but it may have been over 10 years as well.

## 2024-08-19 NOTE — Progress Notes (Signed)
 Cardiology Office Note   Date:  08/19/2024   ID:  Grant Diaz, DOB 1951-08-24, MRN [id number]  PCP:  Valerio Melanie DASEN, NP  Cardiologist:  Denyse Bathe, MD      History of Present Illness: Grant Diaz is a 73 y.o. male who presents for  Chief Complaint  Patient presents with   Follow-up    Holter results    Feels fatigued in afternoons, and SOB on exertion. Holter showed afib. With VR 27-111.      Past Medical History:  Diagnosis Date   Arthritis    Atrial fibrillation (HCC)    Barrett's esophagus    Cancer (HCC) 2008   Prostate-radiation   Cataract 2019   not yet significant enough to warrant surgery   Dysrhythmia    Afib   GERD (gastroesophageal reflux disease)    History of DVT (deep vein thrombosis)    History of hiatal hernia    History of kidney stones    Hypertension    controlled w/meds   Joint pain    Mixed hyperlipidemia    Pancreatitis 2013   [facility]   Prostate cancer [facility])    Pulmonary embolus (HCC) 12/2014   Bilateral pulmonary embolus, [facility]   Sleep apnea 2010   use CPAP regularly   Sleep apnea, obstructive    cpap     Past Surgical History:  Procedure Laterality Date   BIOPSY  06/12/2023   Procedure: BIOPSY;  Surgeon: Therisa Bi, MD;  Location: [facility] ENDOSCOPY;  Service: Gastroenterology;;   ORIN MEDIATE RELEASE Right 03/15/2017   Procedure: CARPAL TUNNEL RELEASE;  Surgeon: Kathlynn Sharper, MD;  Location: [facility] ORS;  Service: Orthopedics;  Laterality: Right;   CHOLECYSTECTOMY     COLONOSCOPY WITH PROPOFOL  N/A 04/02/2018   Procedure: COLONOSCOPY WITH PROPOFOL ;  Surgeon: Jinny Carmine, MD;  Location: [facility] ENDOSCOPY;  Service: Endoscopy;  Laterality: N/A;   COLONOSCOPY WITH PROPOFOL  N/A 06/12/2023   Procedure: COLONOSCOPY WITH PROPOFOL ;  Surgeon: Therisa Bi, MD;  Location: [facility] ENDOSCOPY;  Service: Gastroenterology;  Laterality: N/A;   ESOPHAGOGASTRODUODENOSCOPY N/A 05/28/2013   Procedure: ESOPHAGOGASTRODUODENOSCOPY (EGD);  Surgeon:  Alm VEAR Angle, MD;  Location: [facility];  Service: General;  Laterality: N/A;   ESOPHAGOGASTRODUODENOSCOPY (EGD) WITH PROPOFOL  N/A 02/08/2021   Procedure: ESOPHAGOGASTRODUODENOSCOPY (EGD) WITH PROPOFOL ;  Surgeon: Janalyn Keene NOVAK, MD;  Location: [facility] ENDOSCOPY;  Service: Endoscopy;  Laterality: N/A;   ESOPHAGOGASTRODUODENOSCOPY (EGD) WITH PROPOFOL  N/A 06/12/2023   Procedure: ESOPHAGOGASTRODUODENOSCOPY (EGD) WITH PROPOFOL ;  Surgeon: Therisa Bi, MD;  Location: [facility] ENDOSCOPY;  Service: Gastroenterology;  Laterality: N/A;   GALLBLADDER SURGERY  2003   GANGLION CYST EXCISION Right 03/15/2017   Procedure: REMOVAL GANGLION OF WRIST;  Surgeon: Kathlynn Sharper, MD;  Location: [facility] ORS;  Service: Orthopedics;  Laterality: Right;   HERNIA REPAIR     HIATAL HERNIA REPAIR N/A 05/31/2021   Procedure: LAPAROSCOPIC REPAIR OF HIATAL HERNIA WITH LAP BAND REMOVAL;  Surgeon: Kinsinger, Herlene Righter, MD;  Location: [facility] ORS;  Service: General;  Laterality: N/A;   JOINT REPLACEMENT     KNEE ARTHROSCOPY Bilateral [id number]   LAPAROSCOPIC GASTRIC BANDING  2009   and hernia repair   LAPAROSCOPIC REPAIR OF HIATAL HERNIA WITH LAP BAND REMOVAL   05/31/2021   POLYPECTOMY  06/12/2023   Procedure: POLYPECTOMY;  Surgeon: Therisa Bi, MD;  Location: [facility] ENDOSCOPY;  Service: Gastroenterology;;   RADIATION IMPLANT W/ ULTRASOUND, MALE  2005   internal/external radiation for prostate cancer   TOTAL KNEE ARTHROPLASTY Left 10/12/2015  Procedure: TOTAL KNEE ARTHROPLASTY;  Surgeon: Ozell Flake, MD;  Location: [facility] ORS;  Service: Orthopedics;  Laterality: Left;   TOTAL KNEE ARTHROPLASTY Right 05/15/2017   Procedure: TOTAL KNEE ARTHROPLASTY;  Surgeon: Flake Ozell, MD;  Location: [facility] ORS;  Service: Orthopedics;  Laterality: Right;   UMBILICAL HERNIA REPAIR  2010     Current Outpatient Medications  Medication Sig Dispense Refill   acetaminophen  (TYLENOL ) 325 MG tablet Take 650 mg by mouth every 6 (six) hours as needed for  mild pain.     amiodarone  (PACERONE ) 200 MG tablet Take 1 tablet (200 mg total) by mouth daily. 30 tablet 11   amLODipine  (NORVASC ) 10 MG tablet Take 1 tablet (10 mg total) by mouth daily. 90 tablet 4   celecoxib  (CELEBREX ) 100 MG capsule TAKE 1 CAPSULE BY MOUTH ONCE DAILY AS NEEDED (USE  MINIMALLY  SINCE  TAKING  XARELTO ) 90 capsule 0   Cholecalciferol  25 MCG (1000 UT) capsule Take 1,000 Units by mouth daily.     fluorouracil  (EFUDEX ) 5 % cream Apply twice daily to treatment area for 2-4 weeks as tolerated 40 g 1   ketoconazole  (NIZORAL ) 2 % cream Qhs to aa rash groin prn flares also use to both feet nightly for tinea pedis 60 g 11   lisinopril -hydrochlorothiazide  (ZESTORETIC ) 20-25 MG tablet Take 1 tablet by mouth daily. 90 tablet 4   metoprolol  succinate (TOPROL  XL) 25 MG 24 hr tablet Take 0.5 tablets (12.5 mg total) by mouth daily. 15 tablet 11   Multiple Vitamin (MULTIVITAMIN) capsule Take 1 capsule by mouth daily.     omeprazole  (PRILOSEC) 40 MG capsule Take 1 capsule (40 mg total) by mouth 2 (two) times daily. 60 capsule 11   rosuvastatin  (CRESTOR ) 10 MG tablet Take one tablet (10 MG) by mouth three times a week. 36 tablet 4   triamcinolone  cream (KENALOG ) 0.1 % Apply 1 Application topically as directed. Qd up to 5 days per week to aa eczema on body until clear, avoid face, groin, axilla 80 g 6   XARELTO  20 MG TABS tablet Take 1 tablet by mouth once daily 30 tablet 0   No current facility-administered medications for this visit.    Allergies:   Adhesive [tape]    Social History:   reports that he has never smoked. He has never used smokeless tobacco. He reports that he does not currently use alcohol. He reports that he does not use drugs.   Family History:  family history includes Arthritis in his brother and mother; Asthma in his brother; Diabetes in his mother and son; Heart disease in his maternal grandmother and paternal grandmother; Hypertension in his brother; Obesity in his  mother; Parkinson's disease in his father; Stroke in his maternal grandfather and paternal grandfather.    ROS:     Review of Systems  Constitutional: Negative.   HENT: Negative.    Eyes: Negative.   Respiratory: Negative.    Gastrointestinal: Negative.   Genitourinary: Negative.   Musculoskeletal: Negative.   Skin: Negative.   Neurological: Negative.   Endo/Heme/Allergies: Negative.   Psychiatric/Behavioral: Negative.    All other systems reviewed and are negative.     All other systems are reviewed and negative.    PHYSICAL EXAM: VS:  BP 121/81   Pulse 60   Ht 5' 9 (1.753 m)   Wt 258 lb 6.4 oz (117.2 kg)   SpO2 97%   BMI 38.16 kg/m  , BMI Body mass index is 38.16 kg/m. Last weight:  Wt Readings from Last 3 Encounters:  08/19/24 258 lb 6.4 oz (117.2 kg)  07/29/24 256 lb 12.8 oz (116.5 kg)  06/30/24 249 lb (112.9 kg)     Physical Exam Vitals reviewed.  Constitutional:      Appearance: Normal appearance. He is normal weight.  HENT:     Head: Normocephalic.     Nose: Nose normal.     Mouth/Throat:     Mouth: Mucous membranes are moist.  Eyes:     Pupils: Pupils are equal, round, and reactive to light.  Cardiovascular:     Rate and Rhythm: Normal rate and regular rhythm.     Pulses: Normal pulses.     Heart sounds: Normal heart sounds.  Pulmonary:     Effort: Pulmonary effort is normal.  Abdominal:     General: Abdomen is flat. Bowel sounds are normal.  Musculoskeletal:        General: Normal range of motion.     Cervical back: Normal range of motion.  Skin:    General: Skin is warm.  Neurological:     General: No focal deficit present.     Mental Status: He is alert.  Psychiatric:        Mood and Affect: Mood normal.       EKG:   Recent Labs: 11/27/2023: Hemoglobin 13.1; Magnesium  1.8; Platelets 190 06/30/2024: ALT 14; BUN 22; Creatinine, Ser 1.01; Potassium 3.4; Sodium 143; TSH 1.630    Lipid Panel    Component Value Date/Time   CHOL 117  06/30/2024 1536   TRIG 59 06/30/2024 1536   HDL 43 06/30/2024 1536   LDLCALC 61 06/30/2024 1536      Other studies Reviewed: Additional studies/ records that were reviewed today include:  Review of the above records demonstrates:      01/21/2018    4:36 PM  PAD Screen  Previous PAD dx? No  Previous surgical procedure? No  Pain with walking? No  Feet/toe relief with dangling? No  Painful, non-healing ulcers? No  Extremities discolored? No      ASSESSMENT AND PLAN:    ICD-10-CM   1. Paroxysmal atrial fibrillation (HCC)  I48.0 metoprolol  succinate (TOPROL  XL) 25 MG 24 hr tablet   Still in afib. Not wearing cpap and thats why in afib. Take 1/2 tab metorolol or 12.5 daily.    2. Essential hypertension, benign  I10 metoprolol  succinate (TOPROL  XL) 25 MG 24 hr tablet    3. Mixed hyperlipidemia  E78.2 metoprolol  succinate (TOPROL  XL) 25 MG 24 hr tablet    4. Obstructive sleep apnea syndrome  G47.33 metoprolol  succinate (TOPROL  XL) 25 MG 24 hr tablet    5. Nonrheumatic mitral valve regurgitation  I34.0 metoprolol  succinate (TOPROL  XL) 25 MG 24 hr tablet    6. Morbid obesity (HCC)  E66.01 metoprolol  succinate (TOPROL  XL) 25 MG 24 hr tablet    7. Palpitation  R00.2 metoprolol  succinate (TOPROL  XL) 25 MG 24 hr tablet       Problem List Items Addressed This Visit       Cardiovascular and Mediastinum   Essential hypertension, benign   Relevant Medications   metoprolol  succinate (TOPROL  XL) 25 MG 24 hr tablet   Atrial fibrillation (HCC) - Primary   Relevant Medications   metoprolol  succinate (TOPROL  XL) 25 MG 24 hr tablet     Respiratory   Sleep apnea   Relevant Medications   metoprolol  succinate (TOPROL  XL) 25 MG 24 hr tablet     Other  Morbid obesity (HCC)   Relevant Medications   metoprolol  succinate (TOPROL  XL) 25 MG 24 hr tablet   Mixed hyperlipidemia   Relevant Medications   metoprolol  succinate (TOPROL  XL) 25 MG 24 hr tablet   Other Visit Diagnoses        Nonrheumatic mitral valve regurgitation       Relevant Medications   metoprolol  succinate (TOPROL  XL) 25 MG 24 hr tablet     Palpitation       Relevant Medications   metoprolol  succinate (TOPROL  XL) 25 MG 24 hr tablet          Disposition:   Return in about 5 weeks (around 09/23/2024).    Total time spent: 30 minutes  Signed,  Denyse Bathe, MD  08/19/2024 11:46 AM    [facility]

## 2024-08-20 NOTE — Telephone Encounter (Signed)
 Spoke with patient. He is scheduled with PCP for needed appointment and will also work on getting a copy of his sleep study for us .

## 2024-08-23 NOTE — Patient Instructions (Signed)
 Be Involved in Caring For Your Health:  Taking Medications When medications are taken as directed, they can greatly improve your health. But if they are not taken as prescribed, they may not work. In some cases, not taking them correctly can be harmful. To help ensure your treatment remains effective and safe, understand your medications and how to take them. Bring your medications to each visit for review by your provider.  Your lab results, notes, and after visit summary will be available on My Chart. We strongly encourage you to use this feature. If lab results are abnormal the clinic will contact you with the appropriate steps. If the clinic does not contact you assume the results are satisfactory. You can always view your results on My Chart. If you have questions regarding your health or results, please contact the clinic during office hours. You can also ask questions on My Chart.  We at [facility] are grateful that you chose us  to provide your care. We strive to provide evidence-based and compassionate care and are always looking for feedback. If you get a survey from the clinic please complete this so we can hear your opinions.  Living With Sleep Apnea Sleep apnea is a condition that affects your breathing while you're sleeping. Your tongue or the tissue in your throat may block the flow of air while you sleep. You may have shallow breathing or stop breathing for short periods of time. The breaks in breathing interrupt the deep sleep that you need to feel rested. Even if you don't wake up from the gaps in breathing, you may feel tired during the day. People with sleep apnea may snore loudly. You may have a headache in the morning and feel anxious or depressed. How can sleep apnea affect me? Sleep apnea increases your chances of being very tired during the day. This is called daytime fatigue. Sleep apnea can also increase your risk of: Heart attack. Stroke. Obesity. Type 2  diabetes. Heart failure. Irregular heartbeat. High blood pressure. If you are very tired during the day, you may be more likely to: Not do well in school or at work. Fall asleep while driving. Have trouble paying attention. Develop depression or anxiety. Have problems having sex. This is called sexual dysfunction. What actions can I take to manage sleep apnea? Sleep apnea treatment  If you were given a device to open your airway while you sleep, use it only as told by your health care provider. You may be given: An oral appliance. This is a mouthpiece that shifts your lower jaw forward. A continuous positive airway pressure (CPAP) device. This blows air through a mask. A nasal expiratory positive airway pressure (EPAP) device. This has valves that you put into each nostril. A bi-level positive airway pressure (BIPAP) device. This blows air through a mask when you breathe in and breathe out. You may need surgery if other treatments don't work for you. Sleep habits Go to sleep and wake up at the same time every day. This helps set your internal clock for sleeping. If you stay up later than usual on weekends, try to get up in the morning within 2 hours of the time you usually wake up. Try to get at least 7-9 hours of sleep each night. Stop using a computer, tablet, and mobile phone a few hours before bedtime. Do not take long naps during the day. If you nap, limit it to 30 minutes. Have a relaxing bedtime routine. Reading or listening to  music may relax you and help you sleep. Use your bedroom only for sleep. Keep your television and computer out of your bedroom. Keep your bedroom cool, dark, and quiet. Use a supportive mattress and pillows. Follow your provider's instructions for other changes to sleep habits. Nutrition Do not eat big meals in the evening. Do not have caffeine in the later part of the day. The effects of caffeine can last for more than 5 hours. Follow your provider's  instructions for any changes to what you eat and drink. Lifestyle Do not drink alcohol before bedtime. Alcohol can cause you to fall asleep at first, but then it can cause you to wake up in the middle of the night and have trouble getting back to sleep. Do not smoke, vape, or use nicotine or tobacco. Medicines Take over-the-counter and prescription medicines only as told by your provider. Do not use over-the-counter sleep medicine. You may become dependent on this medicine, and it can make sleep apnea worse. Do not take medicines, such as sedatives and narcotics, unless told to by your provider. Activity Exercise on most days, but avoid exercising in the evening. Exercising near bedtime can interfere with sleeping. If possible, spend time outside every day. Natural light helps with your internal clock. General information Lose weight if you need to. Stay at a healthy weight. If you are having surgery, make sure to tell your provider that you have sleep apnea. You may need to bring your device with you. Keep all follow-up visits. Your provider will want to check on your condition. Where to find more information National Heart, Lung, and Blood Institute: [employer] This information is not intended to replace advice given to you by your health care provider. Make sure you discuss any questions you have with your health care provider. Document Revised: 01/31/2023 Document Reviewed: 01/31/2023 Elsevier Patient Education  2024 [employer].

## 2024-08-25 ENCOUNTER — Encounter: Payer: Self-pay | Admitting: Nurse Practitioner

## 2024-08-25 ENCOUNTER — Ambulatory Visit: Admitting: Nurse Practitioner

## 2024-08-25 VITALS — BP 120/74 | HR 58 | Temp 97.7°F | Ht 69.0 in | Wt 258.0 lb

## 2024-08-25 DIAGNOSIS — G4733 Obstructive sleep apnea (adult) (pediatric): Secondary | ICD-10-CM | POA: Diagnosis not present

## 2024-08-25 NOTE — Assessment & Plan Note (Signed)
 Chronic, stable.  He very consistently uses CPAP, but last study was prior to 2016 and his CPAP machine had broken down.  In need of all new equipment and needs new study to obtain.  Will place more urgent referral.  He is currently borrowing a friends machine on loan.

## 2024-08-25 NOTE — Progress Notes (Signed)
 BP 120/74   Pulse (!) 58   Temp 97.7 F (36.5 C) (Oral)   Ht 5' 9 (1.753 m)   Wt 258 lb (117 kg)   BMI 38.10 kg/m    Subjective:    Patient ID: Grant Diaz, male    DOB: 1951/08/20, 73 y.o.   MRN: [id number]  HPI: Grant Diaz is a 73 y.o. male  Chief Complaint  Patient presents with   Obstructive Sleep Apnea   SLEEP APNEA His CPAP broke on 16th of September, came up with error code.  It is well over 11 years old. Talked to supply company, [employer] (has gone through two owners). They told him they would need compliance and more information. Has not had sleep study in years, 2016. Cardiology wants him to continue use. Sleep apnea status: controlled Duration: chronic Satisfied with current treatment?:  yes CPAP use:  yes Sleep quality with CPAP use: good, average Treament compliance:good compliance Last sleep study: 2016 or before Treatments attempted: CPAP Wakes feeling refreshed:  yes Daytime hypersomnolence:  no Fatigue:  no Insomnia:  no Good sleep hygiene:  yes Difficulty falling asleep:  no Difficulty staying asleep:  no Snoring bothers bed partner:  no Observed apnea by bed partner: yes Obesity:  yes Hypertension: yes  Pulmonary hypertension:  no Coronary artery disease:  yes    08/25/2024   12:00 PM  Results of the Epworth flowsheet  Sitting and reading 3  Watching TV 3  Sitting, inactive in a public place (e.g. a theatre or a meeting) 1  As a passenger in a car for an hour without a break 2  Lying down to rest in the afternoon when circumstances permit 3  Sitting and talking to someone 1  Sitting quietly after a lunch without alcohol 3  In a car, while stopped for a few minutes in traffic 0  Total score 16     BMI Metric Follow Up - 08/25/24 1141       BMI Metric Follow Up-Please document annually   BMI Metric Follow Up Nutrition counseling          Relevant past medical, surgical, family and social history reviewed and updated as  indicated. Interim medical history since our last visit reviewed. Allergies and medications reviewed and updated.  Review of Systems  Constitutional:  Negative for activity change, diaphoresis, fatigue and fever.  Respiratory:  Negative for cough, chest tightness, shortness of breath and wheezing.   Cardiovascular:  Negative for chest pain, palpitations and leg swelling.  Gastrointestinal: Negative.   Neurological: Negative.   Psychiatric/Behavioral: Negative.      Per HPI unless specifically indicated above     Objective:    BP 120/74   Pulse (!) 58   Temp 97.7 F (36.5 C) (Oral)   Ht 5' 9 (1.753 m)   Wt 258 lb (117 kg)   BMI 38.10 kg/m   Wt Readings from Last 3 Encounters:  08/25/24 258 lb (117 kg)  08/19/24 258 lb 6.4 oz (117.2 kg)  07/29/24 256 lb 12.8 oz (116.5 kg)    Physical Exam Vitals and nursing note reviewed.  Constitutional:      General: He is awake. He is not in acute distress.    Appearance: He is well-developed and well-groomed. He is obese. He is not ill-appearing or toxic-appearing.  HENT:     Head: Normocephalic.     Right Ear: Hearing and external ear normal.     Left Ear:  Hearing and external ear normal.  Eyes:     General: Lids are normal.     Extraocular Movements: Extraocular movements intact.     Conjunctiva/sclera: Conjunctivae normal.  Neck:     Thyroid : No thyromegaly.     Vascular: No carotid bruit.  Cardiovascular:     Rate and Rhythm: Normal rate and regular rhythm.     Heart sounds: Normal heart sounds. No murmur heard.    No gallop.  Pulmonary:     Effort: No accessory muscle usage or respiratory distress.     Breath sounds: Normal breath sounds.  Abdominal:     General: Bowel sounds are normal. There is no distension.     Palpations: Abdomen is soft.     Tenderness: There is no abdominal tenderness.  Musculoskeletal:     Cervical back: Full passive range of motion without pain.     Right lower leg: No edema.     Left lower  leg: No edema.  Lymphadenopathy:     Cervical: No cervical adenopathy.  Skin:    General: Skin is warm.     Capillary Refill: Capillary refill takes less than 2 seconds.  Neurological:     Mental Status: He is alert and oriented to person, place, and time.     Deep Tendon Reflexes: Reflexes are normal and symmetric.     Reflex Scores:      Brachioradialis reflexes are 2+ on the right side and 2+ on the left side.      Patellar reflexes are 2+ on the right side and 2+ on the left side. Psychiatric:        Attention and Perception: Attention normal.        Mood and Affect: Mood normal.        Speech: Speech normal.        Behavior: Behavior normal. Behavior is cooperative.        Thought Content: Thought content normal.     Results for orders placed or performed in visit on 06/30/24  Comprehensive metabolic panel with GFR   Collection Time: 06/30/24  3:36 PM  Result Value Ref Range   Glucose 93 70 - 99 mg/dL   BUN 22 8 - 27 mg/dL   Creatinine, Ser 8.98 0.76 - 1.27 mg/dL   eGFR 78 >40 fO/fpw/8.26   BUN/Creatinine Ratio 22 10 - 24   Sodium 143 134 - 144 mmol/L   Potassium 3.4 (L) 3.5 - 5.2 mmol/L   Chloride 104 96 - 106 mmol/L   CO2 23 20 - 29 mmol/L   Calcium  9.1 8.6 - 10.2 mg/dL   Total Protein 6.2 6.0 - 8.5 g/dL   Albumin 3.8 3.8 - 4.8 g/dL   Globulin, Total 2.4 1.5 - 4.5 g/dL   Bilirubin Total 0.4 0.0 - 1.2 mg/dL   Alkaline Phosphatase 71 44 - 121 IU/L   AST 21 0 - 40 IU/L   ALT 14 0 - 44 IU/L  Lipid Panel w/o Chol/HDL Ratio   Collection Time: 06/30/24  3:36 PM  Result Value Ref Range   Cholesterol, Total 117 100 - 199 mg/dL   Triglycerides 59 0 - 149 mg/dL   HDL 43 >60 mg/dL   VLDL Cholesterol Cal 13 5 - 40 mg/dL   LDL Chol Calc (NIH) 61 0 - 99 mg/dL  TSH   Collection Time: 06/30/24  3:36 PM  Result Value Ref Range   TSH 1.630 0.450 - 4.500 uIU/mL      Assessment &  Plan:   Problem List Items Addressed This Visit       Respiratory   Sleep apnea - Primary    Chronic, stable.  He very consistently uses CPAP, but last study was prior to 2016 and his CPAP machine had broken down.  In need of all new equipment and needs new study to obtain.  Will place more urgent referral.  He is currently borrowing a friends machine on loan.      Relevant Orders   Ambulatory referral to Sleep Studies     Follow up plan: Return for as scheduled February.

## 2024-08-27 ENCOUNTER — Telehealth: Payer: Self-pay | Admitting: Nurse Practitioner

## 2024-08-27 NOTE — Telephone Encounter (Unsigned)
 Copied from CRM [id number]. Topic: Clinical - Order For Equipment >> Aug 27, 2024  2:17 PM Tiffini S wrote: Reason for CRM: Damien with [employer] [phone] called for a CPAP machine- needs documents with clinical notes for pressure settings, needs a signed copy of the sleep study   Please fax to: [phone]

## 2024-08-28 NOTE — Telephone Encounter (Signed)
 Patient is aware that we do not have his previous sleep study and that we can not proceed until he has another sleep study. At that time we will receive results and complete a new order for a machine. Patient is very aware of all of this.

## 2024-08-31 ENCOUNTER — Other Ambulatory Visit: Payer: Self-pay | Admitting: Cardiovascular Disease

## 2024-08-31 DIAGNOSIS — I10 Essential (primary) hypertension: Secondary | ICD-10-CM

## 2024-08-31 DIAGNOSIS — G4733 Obstructive sleep apnea (adult) (pediatric): Secondary | ICD-10-CM

## 2024-08-31 DIAGNOSIS — I340 Nonrheumatic mitral (valve) insufficiency: Secondary | ICD-10-CM

## 2024-08-31 DIAGNOSIS — E782 Mixed hyperlipidemia: Secondary | ICD-10-CM

## 2024-08-31 DIAGNOSIS — I480 Paroxysmal atrial fibrillation: Secondary | ICD-10-CM

## 2024-09-23 ENCOUNTER — Encounter: Payer: Self-pay | Admitting: Cardiovascular Disease

## 2024-09-23 ENCOUNTER — Ambulatory Visit: Admitting: Cardiovascular Disease

## 2024-09-23 VITALS — BP 123/74 | HR 92 | Ht 69.0 in | Wt 253.0 lb

## 2024-09-23 DIAGNOSIS — E782 Mixed hyperlipidemia: Secondary | ICD-10-CM | POA: Diagnosis not present

## 2024-09-23 DIAGNOSIS — G4733 Obstructive sleep apnea (adult) (pediatric): Secondary | ICD-10-CM

## 2024-09-23 DIAGNOSIS — I480 Paroxysmal atrial fibrillation: Secondary | ICD-10-CM

## 2024-09-23 DIAGNOSIS — R002 Palpitations: Secondary | ICD-10-CM

## 2024-09-23 DIAGNOSIS — I10 Essential (primary) hypertension: Secondary | ICD-10-CM

## 2024-09-23 DIAGNOSIS — I340 Nonrheumatic mitral (valve) insufficiency: Secondary | ICD-10-CM

## 2024-09-23 NOTE — Progress Notes (Signed)
 Cardiology Office Note   Date:  09/23/2024   ID:  Grant Diaz, DOB [birth year], MRN [id number]  PCP:  Valerio Melanie DASEN, NP  Cardiologist:  Denyse Bathe, MD      History of Present Illness: Grant Diaz is a 73 y.o. male who presents for  Chief Complaint  Patient presents with   Follow-up    5 week follow up    No chest pain or SOB      Past Medical History:  Diagnosis Date   Arthritis    Atrial fibrillation (HCC)    Barrett's esophagus    Cancer (HCC) 2008   Prostate-radiation   Cataract 2019   not yet significant enough to warrant surgery   Dysrhythmia    Afib   GERD (gastroesophageal reflux disease)    History of DVT (deep vein thrombosis)    History of hiatal hernia    History of kidney stones    Hypertension    controlled w/meds   Joint pain    Mixed hyperlipidemia    Pancreatitis 2013   [facility]   Prostate cancer [facility])    Pulmonary embolus (HCC) 12/2014   Bilateral pulmonary embolus, [facility]   Sleep apnea 2010   use CPAP regularly   Sleep apnea, obstructive    cpap     Past Surgical History:  Procedure Laterality Date   BIOPSY  06/12/2023   Procedure: BIOPSY;  Surgeon: Therisa Bi, MD;  Location: [facility] ENDOSCOPY;  Service: Gastroenterology;;   ORIN MEDIATE RELEASE Right 03/15/2017   Procedure: CARPAL TUNNEL RELEASE;  Surgeon: Kathlynn Sharper, MD;  Location: [facility] ORS;  Service: Orthopedics;  Laterality: Right;   CHOLECYSTECTOMY     COLONOSCOPY WITH PROPOFOL  N/A 04/02/2018   Procedure: COLONOSCOPY WITH PROPOFOL ;  Surgeon: Jinny Carmine, MD;  Location: [facility] ENDOSCOPY;  Service: Endoscopy;  Laterality: N/A;   COLONOSCOPY WITH PROPOFOL  N/A 06/12/2023   Procedure: COLONOSCOPY WITH PROPOFOL ;  Surgeon: Therisa Bi, MD;  Location: [facility] ENDOSCOPY;  Service: Gastroenterology;  Laterality: N/A;   ESOPHAGOGASTRODUODENOSCOPY N/A 05/28/2013   Procedure: ESOPHAGOGASTRODUODENOSCOPY (EGD);  Surgeon: Alm VEAR Angle, MD;  Location: [facility] ENDOSCOPY;  Service: General;   Laterality: N/A;   ESOPHAGOGASTRODUODENOSCOPY (EGD) WITH PROPOFOL  N/A 02/08/2021   Procedure: ESOPHAGOGASTRODUODENOSCOPY (EGD) WITH PROPOFOL ;  Surgeon: Janalyn Keene NOVAK, MD;  Location: [facility] ENDOSCOPY;  Service: Endoscopy;  Laterality: N/A;   ESOPHAGOGASTRODUODENOSCOPY (EGD) WITH PROPOFOL  N/A 06/12/2023   Procedure: ESOPHAGOGASTRODUODENOSCOPY (EGD) WITH PROPOFOL ;  Surgeon: Therisa Bi, MD;  Location: [facility] ENDOSCOPY;  Service: Gastroenterology;  Laterality: N/A;   GALLBLADDER SURGERY  2003   GANGLION CYST EXCISION Right 03/15/2017   Procedure: REMOVAL GANGLION OF WRIST;  Surgeon: Kathlynn Sharper, MD;  Location: [facility] ORS;  Service: Orthopedics;  Laterality: Right;   HERNIA REPAIR     HIATAL HERNIA REPAIR N/A 05/31/2021   Procedure: LAPAROSCOPIC REPAIR OF HIATAL HERNIA WITH LAP BAND REMOVAL;  Surgeon: Kinsinger, Herlene Righter, MD;  Location: [facility] ORS;  Service: General;  Laterality: N/A;   JOINT REPLACEMENT     KNEE ARTHROSCOPY Bilateral [id number]   LAPAROSCOPIC GASTRIC BANDING  2009   and hernia repair   LAPAROSCOPIC REPAIR OF HIATAL HERNIA WITH LAP BAND REMOVAL   05/31/2021   POLYPECTOMY  06/12/2023   Procedure: POLYPECTOMY;  Surgeon: Therisa Bi, MD;  Location: [facility] ENDOSCOPY;  Service: Gastroenterology;;   RADIATION IMPLANT W/ ULTRASOUND, MALE  2005   internal/external radiation for prostate cancer   TOTAL KNEE ARTHROPLASTY Left 10/12/2015   Procedure: TOTAL KNEE ARTHROPLASTY;  Surgeon:  Ozell Flake, MD;  Location: [facility] ORS;  Service: Orthopedics;  Laterality: Left;   TOTAL KNEE ARTHROPLASTY Right 05/15/2017   Procedure: TOTAL KNEE ARTHROPLASTY;  Surgeon: Flake Ozell, MD;  Location: [facility] ORS;  Service: Orthopedics;  Laterality: Right;   UMBILICAL HERNIA REPAIR  2010     Current Outpatient Medications  Medication Sig Dispense Refill   acetaminophen  (TYLENOL ) 325 MG tablet Take 650 mg by mouth every 6 (six) hours as needed for mild pain.     amiodarone  (PACERONE ) 200 MG tablet Take 1 tablet  (200 mg total) by mouth daily. 30 tablet 11   amLODipine  (NORVASC ) 10 MG tablet Take 1 tablet (10 mg total) by mouth daily. 90 tablet 4   celecoxib  (CELEBREX ) 100 MG capsule TAKE 1 CAPSULE BY MOUTH ONCE DAILY AS NEEDED (USE  MINIMALLY  SINCE  TAKING  XARELTO ) 90 capsule 0   Cholecalciferol  25 MCG (1000 UT) capsule Take 1,000 Units by mouth daily.     fluorouracil  (EFUDEX ) 5 % cream Apply twice daily to treatment area for 2-4 weeks as tolerated 40 g 1   ketoconazole  (NIZORAL ) 2 % cream Qhs to aa rash groin prn flares also use to both feet nightly for tinea pedis 60 g 11   lisinopril -hydrochlorothiazide  (ZESTORETIC ) 20-25 MG tablet Take 1 tablet by mouth daily. 90 tablet 4   metoprolol  succinate (TOPROL  XL) 25 MG 24 hr tablet Take 0.5 tablets (12.5 mg total) by mouth daily. 15 tablet 11   Multiple Vitamin (MULTIVITAMIN) capsule Take 1 capsule by mouth daily.     omeprazole  (PRILOSEC) 40 MG capsule Take 1 capsule (40 mg total) by mouth 2 (two) times daily. 60 capsule 11   rosuvastatin  (CRESTOR ) 10 MG tablet Take one tablet (10 MG) by mouth three times a week. 36 tablet 4   triamcinolone  cream (KENALOG ) 0.1 % Apply 1 Application topically as directed. Qd up to 5 days per week to aa eczema on body until clear, avoid face, groin, axilla 80 g 6   XARELTO  20 MG TABS tablet Take 1 tablet by mouth once daily 30 tablet 0   No current facility-administered medications for this visit.    Allergies:   Adhesive [tape]    Social History:   reports that he has never smoked. He has never used smokeless tobacco. He reports that he does not currently use alcohol. He reports that he does not use drugs.   Family History:  family history includes Arthritis in his brother and mother; Asthma in his brother; Diabetes in his mother and son; Heart disease in his maternal grandmother and paternal grandmother; Hypertension in his brother; Obesity in his mother; Parkinson's disease in his father; Stroke in his maternal  grandfather and paternal grandfather.    ROS:     Review of Systems  Constitutional: Negative.   HENT: Negative.    Eyes: Negative.   Respiratory: Negative.    Gastrointestinal: Negative.   Genitourinary: Negative.   Musculoskeletal: Negative.   Skin: Negative.   Neurological: Negative.   Endo/Heme/Allergies: Negative.   Psychiatric/Behavioral: Negative.    All other systems reviewed and are negative.     All other systems are reviewed and negative.    PHYSICAL EXAM: VS:  BP 123/74   Pulse 92   Ht 5' 9 (1.753 m)   Wt 253 lb (114.8 kg)   SpO2 96%   BMI 37.36 kg/m  , BMI Body mass index is 37.36 kg/m. Last weight:  Wt Readings from Last 3 Encounters:  09/23/24 253 lb (114.8 kg)  08/25/24 258 lb (117 kg)  08/19/24 258 lb 6.4 oz (117.2 kg)     Physical Exam Vitals reviewed.  Constitutional:      Appearance: Normal appearance. He is normal weight.  HENT:     Head: Normocephalic.     Nose: Nose normal.     Mouth/Throat:     Mouth: Mucous membranes are moist.  Eyes:     Pupils: Pupils are equal, round, and reactive to light.  Cardiovascular:     Rate and Rhythm: Normal rate and regular rhythm.     Pulses: Normal pulses.     Heart sounds: Normal heart sounds.  Pulmonary:     Effort: Pulmonary effort is normal.  Abdominal:     General: Abdomen is flat. Bowel sounds are normal.  Musculoskeletal:        General: Normal range of motion.     Cervical back: Normal range of motion.  Skin:    General: Skin is warm.  Neurological:     General: No focal deficit present.     Mental Status: He is alert.  Psychiatric:        Mood and Affect: Mood normal.       EKG:   Recent Labs: 11/27/2023: Hemoglobin 13.1; Magnesium  1.8; Platelets 190 06/30/2024: ALT 14; BUN 22; Creatinine, Ser 1.01; Potassium 3.4; Sodium 143; TSH 1.630    Lipid Panel    Component Value Date/Time   CHOL 117 06/30/2024 1536   TRIG 59 06/30/2024 1536   HDL 43 06/30/2024 1536   LDLCALC 61  06/30/2024 1536      Other studies Reviewed: Additional studies/ records that were reviewed today include:  Review of the above records demonstrates:      01/21/2018    4:36 PM  PAD Screen  Previous PAD dx? No  Previous surgical procedure? No  Pain with walking? No  Feet/toe relief with dangling? No  Painful, non-healing ulcers? No  Extremities discolored? No      ASSESSMENT AND PLAN:    ICD-10-CM   1. Paroxysmal atrial fibrillation (HCC)  I48.0     2. Essential hypertension, benign  I10     3. Obstructive sleep apnea syndrome  G47.33    wearing cpap now    4. Mixed hyperlipidemia  E78.2     5. Morbid obesity (HCC)  E66.01     6. Nonrheumatic mitral valve regurgitation  I34.0     7. Palpitation  R00.2    had afib 112/min, anf at times 27 while sleep, but only feels tired. change to metoprolol  12.5 every other day 1 week and then stop.       Problem List Items Addressed This Visit       Cardiovascular and Mediastinum   Essential hypertension, benign   Atrial fibrillation (HCC) - Primary     Respiratory   Sleep apnea     Other   Morbid obesity (HCC)   Mixed hyperlipidemia   Other Visit Diagnoses       Nonrheumatic mitral valve regurgitation         Palpitation       had afib 112/min, anf at times 27 while sleep, but only feels tired. change to metoprolol  12.5 every other day 1 week and then stop.          Disposition:   Return in about 4 weeks (around 10/21/2024).    Total time spent: 35 minutes  Signed,  Denyse Bathe, MD  09/23/2024 11:23 AM    [facility]

## 2024-09-30 ENCOUNTER — Encounter: Payer: Self-pay | Admitting: Nurse Practitioner

## 2024-10-14 NOTE — Patient Instructions (Addendum)
 Start taking Amiodarone  in the morning and not at night. Separate it from blood pressure medications by 30 minutes.  Be Involved in Caring For Your Health:  Taking Medications When medications are taken as directed, they can greatly improve your health. But if they are not taken as prescribed, they may not work. In some cases, not taking them correctly can be harmful. To help ensure your treatment remains effective and safe, understand your medications and how to take them. Bring your medications to each visit for review by your provider.  Your lab results, notes, and after visit summary will be available on My Chart. We strongly encourage you to use this feature. If lab results are abnormal the clinic will contact you with the appropriate steps. If the clinic does not contact you assume the results are satisfactory. You can always view your results on My Chart. If you have questions regarding your health or results, please contact the clinic during office hours. You can also ask questions on My Chart.  We at [facility] are grateful that you chose us  to provide your care. We strive to provide evidence-based and compassionate care and are always looking for feedback. If you get a survey from the clinic please complete this so we can hear your opinions.   Insomnia Insomnia is a sleep disorder that makes it difficult to fall asleep or stay asleep. Insomnia can cause fatigue, low energy, difficulty concentrating, mood swings, and poor performance at work or school. There are three different ways to classify insomnia: Difficulty falling asleep. Difficulty staying asleep. Waking up too early in the morning. Any type of insomnia can be long-term (chronic) or short-term (acute). Both are common. Short-term insomnia usually lasts for 3 months or less. Chronic insomnia occurs at least three times a week for longer than 3 months. What are the causes? Insomnia may be caused by another condition,  situation, or substance, such as: Having certain mental health conditions, such as anxiety and depression. Using caffeine, alcohol, tobacco, or drugs. Having gastrointestinal conditions, such as gastroesophageal reflux disease (GERD). Having certain medical conditions. These include: Asthma. Alzheimer's disease. Stroke. Chronic pain. An overactive thyroid  gland (hyperthyroidism). Other sleep disorders, such as restless legs syndrome and sleep apnea. Menopause. Sometimes, the cause of insomnia may not be known. What increases the risk? Risk factors for insomnia include: Gender. Females are affected more often than males. Age. Insomnia is more common as people get older. Stress and certain medical and mental health conditions. Lack of exercise. Having an irregular work schedule. This may include working night shifts and traveling between different time zones. What are the signs or symptoms? If you have insomnia, the main symptom is having trouble falling asleep or having trouble staying asleep. This may lead to other symptoms, such as: Feeling tired or having low energy. Feeling nervous about going to sleep. Not feeling rested in the morning. Having trouble concentrating. Feeling irritable, anxious, or depressed. How is this diagnosed? This condition may be diagnosed based on: Your symptoms and medical history. Your health care provider may ask about: Your sleep habits. Any medical conditions you have. Your mental health. A physical exam. How is this treated? Treatment for insomnia depends on the cause. Treatment may focus on treating an underlying condition that is causing the insomnia. Treatment may also include: Medicines to help you sleep. Counseling or therapy. Lifestyle adjustments to help you sleep better. Follow these instructions at home: Eating and drinking  Limit or avoid alcohol, caffeinated beverages,  and products that contain nicotine and tobacco, especially  close to bedtime. These can disrupt your sleep. Do not eat a large meal or eat spicy foods right before bedtime. This can lead to digestive discomfort that can make it hard for you to sleep. Sleep habits  Keep a sleep diary to help you and your health care provider figure out what could be causing your insomnia. Write down: When you sleep. When you wake up during the night. How well you sleep and how rested you feel the next day. Any side effects of medicines you are taking. What you eat and drink. Make your bedroom a dark, comfortable place where it is easy to fall asleep. Put up shades or blackout curtains to block light from outside. Use a white noise machine to block noise. Keep the temperature cool. Limit screen use before bedtime. This includes: Not watching TV. Not using your smartphone, tablet, or computer. Stick to a routine that includes going to bed and waking up at the same times every day and night. This can help you fall asleep faster. Consider making a quiet activity, such as reading, part of your nighttime routine. Try to avoid taking naps during the day so that you sleep better at night. Get out of bed if you are still awake after 15 minutes of trying to sleep. Keep the lights down, but try reading or doing a quiet activity. When you feel sleepy, go back to bed. General instructions Take over-the-counter and prescription medicines only as told by your health care provider. Exercise regularly as told by your health care provider. However, avoid exercising in the hours right before bedtime. Use relaxation techniques to manage stress. Ask your health care provider to suggest some techniques that may work well for you. These may include: Breathing exercises. Routines to release muscle tension. Visualizing peaceful scenes. Make sure that you drive carefully. Do not drive if you feel very sleepy. Keep all follow-up visits. This is important. Contact a health care provider  if: You are tired throughout the day. You have trouble in your daily routine due to sleepiness. You continue to have sleep problems, or your sleep problems get worse. Get help right away if: You have thoughts about hurting yourself or someone else. Get help right away if you feel like you may hurt yourself or others, or have thoughts about taking your own life. Go to your nearest emergency room or: Call 911. Call the National Suicide Prevention Lifeline at 669-554-5040 or 988. This is open 24 hours a day. Text the Crisis Text Line at (346) 238-1893. Summary Insomnia is a sleep disorder that makes it difficult to fall asleep or stay asleep. Insomnia can be long-term (chronic) or short-term (acute). Treatment for insomnia depends on the cause. Treatment may focus on treating an underlying condition that is causing the insomnia. Keep a sleep diary to help you and your health care provider figure out what could be causing your insomnia. This information is not intended to replace advice given to you by your health care provider. Make sure you discuss any questions you have with your health care provider. Document Revised: 09/19/2021 Document Reviewed: 09/19/2021 Elsevier Patient Education  2024 [employer].

## 2024-10-15 ENCOUNTER — Encounter: Payer: Self-pay | Admitting: Nurse Practitioner

## 2024-10-15 ENCOUNTER — Ambulatory Visit: Admitting: Nurse Practitioner

## 2024-10-15 ENCOUNTER — Other Ambulatory Visit: Payer: Self-pay | Admitting: Cardiovascular Disease

## 2024-10-15 VITALS — BP 114/70 | HR 66 | Temp 97.4°F | Resp 17 | Ht 69.02 in | Wt 253.8 lb

## 2024-10-15 DIAGNOSIS — F19982 Other psychoactive substance use, unspecified with psychoactive substance-induced sleep disorder: Secondary | ICD-10-CM | POA: Diagnosis not present

## 2024-10-15 DIAGNOSIS — I340 Nonrheumatic mitral (valve) insufficiency: Secondary | ICD-10-CM

## 2024-10-15 DIAGNOSIS — G4733 Obstructive sleep apnea (adult) (pediatric): Secondary | ICD-10-CM

## 2024-10-15 DIAGNOSIS — E782 Mixed hyperlipidemia: Secondary | ICD-10-CM

## 2024-10-15 DIAGNOSIS — I10 Essential (primary) hypertension: Secondary | ICD-10-CM

## 2024-10-15 DIAGNOSIS — I480 Paroxysmal atrial fibrillation: Secondary | ICD-10-CM

## 2024-10-15 DIAGNOSIS — G4700 Insomnia, unspecified: Secondary | ICD-10-CM | POA: Insufficient documentation

## 2024-10-15 NOTE — Assessment & Plan Note (Signed)
 Over past year, worse after starting Amiodarone  which he is taking at night. Recently received new CPAP equipment and this did help some. At this time recommend he change to taking Amiodarone  in morning as research does show risk for insomnia and increased dreaming, if taken in morning this may improve current symptoms. If sleep aide required would recommend starting out with Melatonin over the counter at low dose, due to OSA will try to avoid prescription medications if possible. Suspect with changing his Amiodarone  administration time and his new CPAP equipment, symptoms may improve. Educated his wife and him on this.

## 2024-10-15 NOTE — Progress Notes (Signed)
 "  BP 114/70 (BP Location: Left Arm, Patient Position: Sitting, Cuff Size: Normal)   Pulse 66   Temp (!) 97.4 F (36.3 C) (Oral)   Resp 17   Ht 5' 9.02 (1.753 m)   Wt 253 lb 12.8 oz (115.1 kg)   SpO2 97%   BMI 37.46 kg/m    Subjective:    Patient ID: Grant Diaz, male    DOB: 1951-02-21, 73 y.o.   MRN: [id number]  HPI: Grant Diaz is a 73 y.o. male  Chief Complaint  Patient presents with   Insomnia    Has not been sleeping good at all, not sure why. Wonders if it has to do with the way that he is taking his medication or if he just needs something to help him sleep.   INSOMNIA Can go to sleep, but then wakes up around midnight or 3 am.  No issues getting to sleep around 10 or 11.  Does wear CPAP nightly, received his new equipment yesterday -- with this he did not wake up as early (4 am).  When did not have it did not sleep well.  Has been on Amiodarone  for 4 months and takes at night, sleep did become worse at night. Duration: for one year -- started before recent stress Satisfied with sleep quality: no Difficulty falling asleep: no Difficulty staying asleep: yes Waking a few hours after sleep onset: yes Early morning awakenings: yes Daytime hypersomnolence: no Wakes feeling refreshed: no Good sleep hygiene: yes Apnea: yes Snoring: yes Depressed/anxious mood: no Recent stress: yes Restless legs/nocturnal leg cramps: no Chronic pain/arthritis: no History of sleep study: yes Treatments attempted: melatonin       10/15/2024   10:38 AM 08/25/2024   11:31 AM 05/08/2024    3:00 PM 11/27/2023    3:20 PM 04/23/2023    3:56 PM  Depression screen PHQ 2/9  Decreased Interest 0 0 0 0 0  Down, Depressed, Hopeless 0 0 0 0 0  PHQ - 2 Score 0 0 0 0 0  Altered sleeping 1 0 0 0 0  Tired, decreased energy 1 1 0 1 0  Change in appetite 0 1 0 0 0  Feeling bad or failure about yourself  0 0 0 0 0  Trouble concentrating 0 0 0 0 0  Moving slowly or fidgety/restless 0 0 0 0 0   Suicidal thoughts 0 0 0 0 0  PHQ-9 Score 2 2  0  1  0   Difficult doing work/chores  Not difficult at all Not difficult at all Not difficult at all Not difficult at all     Data saved with a previous flowsheet row definition       10/15/2024   10:39 AM 08/25/2024   11:31 AM 11/27/2023    3:20 PM 12/15/2022   10:45 AM  GAD 7 : Generalized Anxiety Score  Nervous, Anxious, on Edge 0 1 0 0  Control/stop worrying 0 0 0 0  Worry too much - different things 0 0 0 0  Trouble relaxing 1 0 0 0  Restless 0 0 0 0  Easily annoyed or irritable 1 1 0 0  Afraid - awful might happen 0 0 0 0  Total GAD 7 Score 2 2 0 0  Anxiety Difficulty  Not difficult at all Not difficult at all Not difficult at all   Relevant past medical, surgical, family and social history reviewed and updated as indicated. Interim medical history since our  last visit reviewed. Allergies and medications reviewed and updated.  Review of Systems  Constitutional:  Negative for activity change, diaphoresis, fatigue and fever.  Respiratory:  Negative for cough, chest tightness, shortness of breath and wheezing.   Cardiovascular:  Negative for chest pain, palpitations and leg swelling.  Gastrointestinal: Negative.   Neurological: Negative.   Psychiatric/Behavioral:  Positive for sleep disturbance. Negative for decreased concentration, self-injury and suicidal ideas. The patient is not nervous/anxious.    Per HPI unless specifically indicated above     Objective:    BP 114/70 (BP Location: Left Arm, Patient Position: Sitting, Cuff Size: Normal)   Pulse 66   Temp (!) 97.4 F (36.3 C) (Oral)   Resp 17   Ht 5' 9.02 (1.753 m)   Wt 253 lb 12.8 oz (115.1 kg)   SpO2 97%   BMI 37.46 kg/m   Wt Readings from Last 3 Encounters:  10/15/24 253 lb 12.8 oz (115.1 kg)  09/23/24 253 lb (114.8 kg)  08/25/24 258 lb (117 kg)    Physical Exam Vitals and nursing note reviewed.  Constitutional:      General: He is awake. He is not in  acute distress.    Appearance: He is well-developed and well-groomed. He is obese. He is not ill-appearing or toxic-appearing.  HENT:     Head: Normocephalic.     Right Ear: Hearing and external ear normal.     Left Ear: Hearing and external ear normal.  Eyes:     General: Lids are normal.     Extraocular Movements: Extraocular movements intact.     Conjunctiva/sclera: Conjunctivae normal.  Neck:     Thyroid : No thyromegaly.     Vascular: No carotid bruit.  Cardiovascular:     Rate and Rhythm: Normal rate and regular rhythm.     Heart sounds: Normal heart sounds. No murmur heard.    No gallop.  Pulmonary:     Effort: No accessory muscle usage or respiratory distress.     Breath sounds: Normal breath sounds. No decreased breath sounds, wheezing or rales.  Abdominal:     General: Bowel sounds are normal. There is no distension.     Palpations: Abdomen is soft.     Tenderness: There is no abdominal tenderness.  Musculoskeletal:     Cervical back: Full passive range of motion without pain.     Right lower leg: No edema.     Left lower leg: No edema.  Lymphadenopathy:     Cervical: No cervical adenopathy.  Skin:    General: Skin is warm.     Capillary Refill: Capillary refill takes less than 2 seconds.  Neurological:     Mental Status: He is alert and oriented to person, place, and time.     Deep Tendon Reflexes: Reflexes are normal and symmetric.     Reflex Scores:      Brachioradialis reflexes are 2+ on the right side and 2+ on the left side.      Patellar reflexes are 2+ on the right side and 2+ on the left side. Psychiatric:        Attention and Perception: Attention normal.        Mood and Affect: Mood normal.        Speech: Speech normal.        Behavior: Behavior normal. Behavior is cooperative.        Thought Content: Thought content normal.    Results for orders placed or performed in visit on  06/30/24  Comprehensive metabolic panel with GFR   Collection Time:  06/30/24  3:36 PM  Result Value Ref Range   Glucose 93 70 - 99 mg/dL   BUN 22 8 - 27 mg/dL   Creatinine, Ser 8.98 0.76 - 1.27 mg/dL   eGFR 78 >40 fO/fpw/8.26   BUN/Creatinine Ratio 22 10 - 24   Sodium 143 134 - 144 mmol/L   Potassium 3.4 (L) 3.5 - 5.2 mmol/L   Chloride 104 96 - 106 mmol/L   CO2 23 20 - 29 mmol/L   Calcium  9.1 8.6 - 10.2 mg/dL   Total Protein 6.2 6.0 - 8.5 g/dL   Albumin 3.8 3.8 - 4.8 g/dL   Globulin, Total 2.4 1.5 - 4.5 g/dL   Bilirubin Total 0.4 0.0 - 1.2 mg/dL   Alkaline Phosphatase 71 44 - 121 IU/L   AST 21 0 - 40 IU/L   ALT 14 0 - 44 IU/L  Lipid Panel w/o Chol/HDL Ratio   Collection Time: 06/30/24  3:36 PM  Result Value Ref Range   Cholesterol, Total 117 100 - 199 mg/dL   Triglycerides 59 0 - 149 mg/dL   HDL 43 >60 mg/dL   VLDL Cholesterol Cal 13 5 - 40 mg/dL   LDL Chol Calc (NIH) 61 0 - 99 mg/dL  TSH   Collection Time: 06/30/24  3:36 PM  Result Value Ref Range   TSH 1.630 0.450 - 4.500 uIU/mL      Assessment & Plan:   Problem List Items Addressed This Visit       Other   Insomnia - Primary   Over past year, worse after starting Amiodarone  which he is taking at night. Recently received new CPAP equipment and this did help some. At this time recommend he change to taking Amiodarone  in morning as research does show risk for insomnia and increased dreaming, if taken in morning this may improve current symptoms. If sleep aide required would recommend starting out with Melatonin over the counter at low dose, due to OSA will try to avoid prescription medications if possible. Suspect with changing his Amiodarone  administration time and his new CPAP equipment, symptoms may improve. Educated his wife and him on this.        Follow up plan: Return for as scheduled in February.      "

## 2024-10-21 ENCOUNTER — Ambulatory Visit: Admitting: Cardiovascular Disease

## 2024-10-21 DIAGNOSIS — L304 Erythema intertrigo: Secondary | ICD-10-CM

## 2024-10-29 ENCOUNTER — Ambulatory Visit: Admitting: Nurse Practitioner

## 2024-10-30 ENCOUNTER — Encounter: Payer: Self-pay | Admitting: Cardiovascular Disease

## 2024-10-30 ENCOUNTER — Ambulatory Visit: Admitting: Cardiovascular Disease

## 2024-10-30 VITALS — BP 132/58 | HR 67 | Ht 68.0 in | Wt 252.0 lb

## 2024-10-30 DIAGNOSIS — I340 Nonrheumatic mitral (valve) insufficiency: Secondary | ICD-10-CM

## 2024-10-30 DIAGNOSIS — G4733 Obstructive sleep apnea (adult) (pediatric): Secondary | ICD-10-CM | POA: Diagnosis not present

## 2024-10-30 DIAGNOSIS — R002 Palpitations: Secondary | ICD-10-CM

## 2024-10-30 DIAGNOSIS — I480 Paroxysmal atrial fibrillation: Secondary | ICD-10-CM

## 2024-10-30 DIAGNOSIS — E782 Mixed hyperlipidemia: Secondary | ICD-10-CM

## 2024-10-30 DIAGNOSIS — I10 Essential (primary) hypertension: Secondary | ICD-10-CM | POA: Diagnosis not present

## 2024-10-30 NOTE — Progress Notes (Signed)
 "     Cardiology Office Note   Date:  10/30/2024   ID:  Grant Diaz, DOB [birth year], MRN [id number]  PCP:  Valerio Melanie DASEN, NP  Cardiologist:  Denyse Bathe, MD      History of Present Illness: Grant Diaz is a 74 y.o. male who presents for  Chief Complaint  Patient presents with   Follow-up    4 weeks follow up    Has new cpap now, and feels good.      Past Medical History:  Diagnosis Date   Arthritis    Atrial fibrillation (HCC)    Barrett's esophagus    Cancer (HCC) 2008   Prostate-radiation   Cataract 2019   not yet significant enough to warrant surgery   Dysrhythmia    Afib   GERD (gastroesophageal reflux disease)    History of DVT (deep vein thrombosis)    History of hiatal hernia    History of kidney stones    Hypertension    controlled w/meds   Joint pain    Mixed hyperlipidemia    Pancreatitis 2013   [facility]   Prostate cancer [facility])    Pulmonary embolus (HCC) 12/2014   Bilateral pulmonary embolus, [facility]   Sleep apnea 2010   use CPAP regularly   Sleep apnea, obstructive    cpap     Past Surgical History:  Procedure Laterality Date   BIOPSY  06/12/2023   Procedure: BIOPSY;  Surgeon: Therisa Bi, MD;  Location: [facility] ENDOSCOPY;  Service: Gastroenterology;;   ORIN MEDIATE RELEASE Right 03/15/2017   Procedure: CARPAL TUNNEL RELEASE;  Surgeon: Kathlynn Sharper, MD;  Location: [facility] ORS;  Service: Orthopedics;  Laterality: Right;   CHOLECYSTECTOMY     COLONOSCOPY WITH PROPOFOL  N/A 04/02/2018   Procedure: COLONOSCOPY WITH PROPOFOL ;  Surgeon: Jinny Carmine, MD;  Location: [facility] ENDOSCOPY;  Service: Endoscopy;  Laterality: N/A;   COLONOSCOPY WITH PROPOFOL  N/A 06/12/2023   Procedure: COLONOSCOPY WITH PROPOFOL ;  Surgeon: Therisa Bi, MD;  Location: [facility] ENDOSCOPY;  Service: Gastroenterology;  Laterality: N/A;   ESOPHAGOGASTRODUODENOSCOPY N/A 05/28/2013   Procedure: ESOPHAGOGASTRODUODENOSCOPY (EGD);  Surgeon: Alm VEAR Angle, MD;  Location: [facility] ENDOSCOPY;  Service:  General;  Laterality: N/A;   ESOPHAGOGASTRODUODENOSCOPY (EGD) WITH PROPOFOL  N/A 02/08/2021   Procedure: ESOPHAGOGASTRODUODENOSCOPY (EGD) WITH PROPOFOL ;  Surgeon: Janalyn Keene NOVAK, MD;  Location: [facility] ENDOSCOPY;  Service: Endoscopy;  Laterality: N/A;   ESOPHAGOGASTRODUODENOSCOPY (EGD) WITH PROPOFOL  N/A 06/12/2023   Procedure: ESOPHAGOGASTRODUODENOSCOPY (EGD) WITH PROPOFOL ;  Surgeon: Therisa Bi, MD;  Location[facility] ENDOSCOPY;  Service: Gastroenterology;  Laterality: N/A;   GALLBLADDER SURGERY  2003   GANGLION CYST EXCISION Right 03/15/2017   Procedure: REMOVAL GANGLION OF WRIST;  Surgeon: Kathlynn Sharper, MD;  Location: [facility] ORS;  Service: Orthopedics;  Laterality: Right;   HERNIA REPAIR     HIATAL HERNIA REPAIR N/A 05/31/2021   Procedure: LAPAROSCOPIC REPAIR OF HIATAL HERNIA WITH LAP BAND REMOVAL;  Surgeon: Kinsinger, Herlene Righter, MD;  Location: [facility] ORS;  Service: General;  Laterality: N/A;   JOINT REPLACEMENT     KNEE ARTHROSCOPY Bilateral [id number]   LAPAROSCOPIC GASTRIC BANDING  2009   and hernia repair   LAPAROSCOPIC REPAIR OF HIATAL HERNIA WITH LAP BAND REMOVAL   05/31/2021   POLYPECTOMY  06/12/2023   Procedure: POLYPECTOMY;  Surgeon: Therisa Bi, MD;  Location: [facility] ENDOSCOPY;  Service: Gastroenterology;;   RADIATION IMPLANT W/ ULTRASOUND, MALE  2005   internal/external radiation for prostate cancer   TOTAL KNEE ARTHROPLASTY Left 10/12/2015   Procedure:  TOTAL KNEE ARTHROPLASTY;  Surgeon: Ozell Flake, MD;  Location: [facility] ORS;  Service: Orthopedics;  Laterality: Left;   TOTAL KNEE ARTHROPLASTY Right 05/15/2017   Procedure: TOTAL KNEE ARTHROPLASTY;  Surgeon: Flake Ozell, MD;  Location: [facility] ORS;  Service: Orthopedics;  Laterality: Right;   UMBILICAL HERNIA REPAIR  2010     Current Outpatient Medications  Medication Sig Dispense Refill   acetaminophen  (TYLENOL ) 325 MG tablet Take 650 mg by mouth every 6 (six) hours as needed for mild pain.     amiodarone  (PACERONE ) 200 MG tablet  Take 1 tablet (200 mg total) by mouth daily. 30 tablet 11   amLODipine  (NORVASC ) 10 MG tablet Take 1 tablet (10 mg total) by mouth daily. 90 tablet 4   celecoxib  (CELEBREX ) 100 MG capsule TAKE 1 CAPSULE BY MOUTH ONCE DAILY AS NEEDED (USE  MINIMALLY  SINCE  TAKING  XARELTO ) 90 capsule 0   Cholecalciferol  25 MCG (1000 UT) capsule Take 1,000 Units by mouth daily.     fluorouracil  (EFUDEX ) 5 % cream Apply twice daily to treatment area for 2-4 weeks as tolerated 40 g 1   ketoconazole  (NIZORAL ) 2 % cream APPLY TO GROIN AREA ONCE DAILY 60 g 0   lisinopril -hydrochlorothiazide  (ZESTORETIC ) 20-25 MG tablet Take 1 tablet by mouth daily. 90 tablet 4   Multiple Vitamin (MULTIVITAMIN) capsule Take 1 capsule by mouth daily.     omeprazole  (PRILOSEC) 40 MG capsule Take 1 capsule (40 mg total) by mouth 2 (two) times daily. 60 capsule 11   rosuvastatin  (CRESTOR ) 10 MG tablet Take one tablet (10 MG) by mouth three times a week. 36 tablet 4   triamcinolone  cream (KENALOG ) 0.1 % Apply 1 Application topically as directed. Qd up to 5 days per week to aa eczema on body until clear, avoid face, groin, axilla 80 g 6   XARELTO  20 MG TABS tablet Take 1 tablet by mouth once daily 30 tablet 0   No current facility-administered medications for this visit.    Allergies:   Adhesive [tape]    Social History:   reports that he has never smoked. He has never used smokeless tobacco. He reports that he does not currently use alcohol. He reports that he does not use drugs.   Family History:  family history includes Arthritis in his brother and mother; Asthma in his brother; Diabetes in his mother and son; Heart disease in his maternal grandmother and paternal grandmother; Hypertension in his brother; Obesity in his mother; Parkinson's disease in his father; Stroke in his maternal grandfather and paternal grandfather.    ROS:     Review of Systems  Constitutional: Negative.   HENT: Negative.    Eyes: Negative.   Respiratory:  Negative.    Gastrointestinal: Negative.   Genitourinary: Negative.   Musculoskeletal: Negative.   Skin: Negative.   Neurological: Negative.   Endo/Heme/Allergies: Negative.   Psychiatric/Behavioral: Negative.    All other systems reviewed and are negative.     All other systems are reviewed and negative.    PHYSICAL EXAM: VS:  BP (!) 132/58   Pulse 67   Ht 5' 8 (1.727 m)   Wt 252 lb (114.3 kg)   SpO2 99%   BMI 38.32 kg/m  , BMI Body mass index is 38.32 kg/m. Last weight:  Wt Readings from Last 3 Encounters:  10/30/24 252 lb (114.3 kg)  10/15/24 253 lb 12.8 oz (115.1 kg)  09/23/24 253 lb (114.8 kg)     Physical Exam Vitals reviewed.  Constitutional:      Appearance: Normal appearance. He is normal weight.  HENT:     Head: Normocephalic.     Nose: Nose normal.     Mouth/Throat:     Mouth: Mucous membranes are moist.  Eyes:     Pupils: Pupils are equal, round, and reactive to light.  Cardiovascular:     Rate and Rhythm: Normal rate and regular rhythm.     Pulses: Normal pulses.     Heart sounds: Normal heart sounds.  Pulmonary:     Effort: Pulmonary effort is normal.  Abdominal:     General: Abdomen is flat. Bowel sounds are normal.  Musculoskeletal:        General: Normal range of motion.     Cervical back: Normal range of motion.  Skin:    General: Skin is warm.  Neurological:     General: No focal deficit present.     Mental Status: He is alert.  Psychiatric:        Mood and Affect: Mood normal.       EKG:   Recent Labs: 11/27/2023: Hemoglobin 13.1; Magnesium  1.8; Platelets 190 06/30/2024: ALT 14; BUN 22; Creatinine, Ser 1.01; Potassium 3.4; Sodium 143; TSH 1.630    Lipid Panel    Component Value Date/Time   CHOL 117 06/30/2024 1536   TRIG 59 06/30/2024 1536   HDL 43 06/30/2024 1536   LDLCALC 61 06/30/2024 1536      Other studies Reviewed: Additional studies/ records that were reviewed today include:  Review of the above records  demonstrates:      01/21/2018    4:36 PM  PAD Screen  Previous PAD dx? No  Previous surgical procedure? No  Pain with walking? No  Feet/toe relief with dangling? No  Painful, non-healing ulcers? No  Extremities discolored? No      ASSESSMENT AND PLAN:    ICD-10-CM   1. Paroxysmal atrial fibrillation (HCC)  I48.0     2. Essential hypertension, benign  I10     3. Obstructive sleep apnea syndrome  G47.33     4. Mixed hyperlipidemia  E78.2     5. Morbid obesity (HCC)  E66.01     6. Nonrheumatic mitral valve regurgitation  I34.0     7. Palpitation  R00.2        Problem List Items Addressed This Visit       Cardiovascular and Mediastinum   Essential hypertension, benign   Atrial fibrillation (HCC) - Primary     Respiratory   Sleep apnea     Other   Morbid obesity (HCC)   Mixed hyperlipidemia   Other Visit Diagnoses       Nonrheumatic mitral valve regurgitation         Palpitation              Disposition:   Return in about 3 months (around 01/28/2025).    Total time spent: 35 minutes  Signed,  Denyse Bathe, MD  10/30/2024 11:33 AM    [facility] "

## 2024-11-04 ENCOUNTER — Other Ambulatory Visit: Payer: Self-pay | Admitting: Nurse Practitioner

## 2024-11-05 NOTE — Telephone Encounter (Signed)
 Requested Prescriptions  Pending Prescriptions Disp Refills   celecoxib  (CELEBREX ) 100 MG capsule [Pharmacy Med Name: Celecoxib  100 MG Oral Capsule] 90 capsule 0    Sig: TAKE 1 CAPSULE BY MOUTH ONCE DAILY AS NEEDED. USE MINIMALLY SINCE TAKING XARELTO .     Analgesics:  COX2 Inhibitors Failed - 11/05/2024 11:05 AM      Failed - Manual Review: Labs are only required if the patient has taken medication for more than 8 weeks.      Passed - HGB in normal range and within 360 days    Hemoglobin  Date Value Ref Range Status  11/27/2023 13.1 13.0 - 17.7 g/dL Final         Passed - Cr in normal range and within 360 days    Creatinine, Ser  Date Value Ref Range Status  06/30/2024 1.01 0.76 - 1.27 mg/dL Final         Passed - HCT in normal range and within 360 days    Hematocrit  Date Value Ref Range Status  11/27/2023 39.6 37.5 - 51.0 % Final         Passed - AST in normal range and within 360 days    AST  Date Value Ref Range Status  06/30/2024 21 0 - 40 IU/L Final         Passed - ALT in normal range and within 360 days    ALT  Date Value Ref Range Status  06/30/2024 14 0 - 44 IU/L Final         Passed - eGFR is 30 or above and within 360 days    GFR calc Af Amer  Date Value Ref Range Status  11/24/2020 101 >59 mL/min/1.73 Final    Comment:    **In accordance with recommendations from the NKF-ASN Task force,**   [facility] is in the process of updating its eGFR calculation to the   2021 CKD-EPI creatinine equation that estimates kidney function   without a race variable.    GFR, Estimated  Date Value Ref Range Status  06/01/2021 >60 >60 mL/min Final    Comment:    (NOTE) Calculated using the CKD-EPI Creatinine Equation (2021)    eGFR  Date Value Ref Range Status  06/30/2024 78 >59 mL/min/1.73 Final         Passed - Patient is not pregnant      Passed - Valid encounter within last 12 months    Recent Outpatient Visits           3 weeks ago Drug-induced insomnia  (HCC)   [place] [facility] Kennedy, Mackey T, NP   2 months ago Obstructive sleep apnea syndrome   [place] [facility] Haworth, [place] T, NP   4 months ago Paroxysmal atrial fibrillation (HCC)   [place] [facility] [place], Melanie T, NP   11 months ago Paroxysmal atrial fibrillation [facility])   [place] [facility] Valerio Melanie DASEN, NP       Future Appointments             In 3 months Hester Alm BROCKS, MD [facility]   In 3 months Fernand Denyse LABOR, MD [facility]

## 2024-11-09 ENCOUNTER — Other Ambulatory Visit: Payer: Self-pay | Admitting: Physician Assistant

## 2024-11-13 ENCOUNTER — Other Ambulatory Visit: Payer: Self-pay | Admitting: Physician Assistant

## 2024-11-14 ENCOUNTER — Telehealth: Payer: Self-pay

## 2024-11-14 ENCOUNTER — Other Ambulatory Visit: Payer: Self-pay | Admitting: Cardiovascular Disease

## 2024-11-14 DIAGNOSIS — I340 Nonrheumatic mitral (valve) insufficiency: Secondary | ICD-10-CM

## 2024-11-14 DIAGNOSIS — E782 Mixed hyperlipidemia: Secondary | ICD-10-CM

## 2024-11-14 DIAGNOSIS — G4733 Obstructive sleep apnea (adult) (pediatric): Secondary | ICD-10-CM

## 2024-11-14 DIAGNOSIS — I10 Essential (primary) hypertension: Secondary | ICD-10-CM

## 2024-11-14 DIAGNOSIS — I480 Paroxysmal atrial fibrillation: Secondary | ICD-10-CM

## 2024-11-14 NOTE — Telephone Encounter (Signed)
 Copied from CRM [id number]. Topic: Clinical - Medication Refill >> Nov 14, 2024  9:37 AM Delon DASEN wrote: Not seeing original prescriber anymore Medication:  omeprazole  (PRILOSEC) 40 MG capsule  Has the patient contacted their pharmacy? Yes (Agent: If no, request that the patient contact the pharmacy for the refill. If patient does not wish to contact the pharmacy document the reason why and proceed with request.) (Agent: If yes, when and what did the pharmacy advise?)  This is the patient's preferred pharmacy:  [facility] [address] ([address]), [place] - [address] [place]) [place] [address] Phone: [phone] Fax: [phone]    Is this the correct pharmacy for this prescription? Yes If no, delete pharmacy and type the correct one.   Has the prescription been filled recently? Yes  Is the patient out of the medication? Yes  Has the patient been seen for an appointment in the last year OR does the patient have an upcoming appointment? Yes  Can we respond through MyChart? Yes  Agent: Please be advised that Rx refills may take up to 3 business days. We ask that you follow-up with your pharmacy.

## 2024-11-14 NOTE — Telephone Encounter (Signed)
 Noted, agree he will need to contact GI on this one.

## 2024-11-14 NOTE — Telephone Encounter (Signed)
 Returned call to patient. Advised that as of 10/30/2024  Ellouise Console, PA-C at GI discontinued this prescription and therefore he would need to reach out to them to discuss the reason and if it can be restarted. He understands and agrees.

## 2024-12-11 ENCOUNTER — Encounter: Admitting: Nurse Practitioner

## 2025-02-04 ENCOUNTER — Ambulatory Visit: Admitting: Dermatology

## 2025-02-05 ENCOUNTER — Ambulatory Visit: Admitting: Cardiovascular Disease

## 2025-05-21 ENCOUNTER — Ambulatory Visit
